# Patient Record
Sex: Male | Born: 1940
Health system: Southern US, Community
[De-identification: ages and names within clinical notes are randomized; demographics above are authoritative.]

## PROBLEM LIST (undated history)

## (undated) DIAGNOSIS — I8500 Esophageal varices without bleeding: Secondary | ICD-10-CM

## (undated) DIAGNOSIS — K8050 Calculus of bile duct without cholangitis or cholecystitis without obstruction: Secondary | ICD-10-CM

## (undated) DIAGNOSIS — Z9889 Other specified postprocedural states: Secondary | ICD-10-CM

## (undated) DIAGNOSIS — C44221 Squamous cell carcinoma of skin of unspecified ear and external auricular canal: Secondary | ICD-10-CM

## (undated) DIAGNOSIS — I639 Cerebral infarction, unspecified: Secondary | ICD-10-CM

## (undated) DIAGNOSIS — R748 Abnormal levels of other serum enzymes: Secondary | ICD-10-CM

## (undated) DIAGNOSIS — N189 Chronic kidney disease, unspecified: Secondary | ICD-10-CM

## (undated) DIAGNOSIS — E039 Hypothyroidism, unspecified: Secondary | ICD-10-CM

## (undated) DIAGNOSIS — K861 Other chronic pancreatitis: Secondary | ICD-10-CM

## (undated) DIAGNOSIS — E119 Type 2 diabetes mellitus without complications: Secondary | ICD-10-CM

## (undated) DIAGNOSIS — I10 Essential (primary) hypertension: Secondary | ICD-10-CM

## (undated) DIAGNOSIS — K7581 Nonalcoholic steatohepatitis (NASH): Secondary | ICD-10-CM

## (undated) DIAGNOSIS — D649 Anemia, unspecified: Secondary | ICD-10-CM

## (undated) HISTORY — DX: Essential (primary) hypertension: I10

## (undated) HISTORY — DX: Calculus of bile duct without cholangitis or cholecystitis without obstruction: K80.50

## (undated) HISTORY — DX: Squamous cell carcinoma of skin of unspecified ear and external auricular canal: C44.221

## (undated) HISTORY — PX: CATARACT EXTRACTION: SUR2

## (undated) HISTORY — DX: Other specified postprocedural states: Z98.890

## (undated) HISTORY — DX: Nonalcoholic steatohepatitis (NASH): K75.81

## (undated) HISTORY — DX: Abnormal levels of other serum enzymes: R74.8

## (undated) HISTORY — DX: Other chronic pancreatitis: K86.1

## (undated) HISTORY — DX: Chronic kidney disease, unspecified: N18.9

## (undated) HISTORY — DX: Esophageal varices without bleeding: I85.00

## (undated) HISTORY — DX: Anemia, unspecified: D64.9

## (undated) HISTORY — DX: Hypothyroidism, unspecified: E03.9

## (undated) HISTORY — DX: Type 2 diabetes mellitus without complications: E11.9

---

## 2000-04-26 ENCOUNTER — Encounter: Payer: Self-pay | Admitting: Internal Medicine

## 2000-04-26 ENCOUNTER — Ambulatory Visit (HOSPITAL_COMMUNITY): Admission: RE | Admit: 2000-04-26 | Discharge: 2000-04-26 | Payer: Self-pay | Admitting: Internal Medicine

## 2000-04-29 ENCOUNTER — Ambulatory Visit (HOSPITAL_COMMUNITY): Admission: RE | Admit: 2000-04-29 | Discharge: 2000-04-29 | Payer: Self-pay | Admitting: Internal Medicine

## 2000-04-29 ENCOUNTER — Encounter: Payer: Self-pay | Admitting: Internal Medicine

## 2000-04-29 ENCOUNTER — Encounter (INDEPENDENT_AMBULATORY_CARE_PROVIDER_SITE_OTHER): Payer: Self-pay | Admitting: Specialist

## 2001-06-18 ENCOUNTER — Ambulatory Visit (HOSPITAL_COMMUNITY): Admission: RE | Admit: 2001-06-18 | Discharge: 2001-06-18 | Payer: Self-pay | Admitting: Internal Medicine

## 2001-06-18 ENCOUNTER — Encounter: Payer: Self-pay | Admitting: Internal Medicine

## 2001-12-10 ENCOUNTER — Ambulatory Visit (HOSPITAL_COMMUNITY): Admission: RE | Admit: 2001-12-10 | Discharge: 2001-12-10 | Payer: Self-pay | Admitting: Urology

## 2001-12-10 ENCOUNTER — Encounter: Payer: Self-pay | Admitting: Urology

## 2002-06-01 ENCOUNTER — Encounter: Payer: Self-pay | Admitting: Internal Medicine

## 2002-06-01 ENCOUNTER — Ambulatory Visit (HOSPITAL_COMMUNITY): Admission: RE | Admit: 2002-06-01 | Discharge: 2002-06-01 | Payer: Self-pay | Admitting: Internal Medicine

## 2002-07-09 DIAGNOSIS — Z9889 Other specified postprocedural states: Secondary | ICD-10-CM

## 2002-07-09 HISTORY — DX: Other specified postprocedural states: Z98.890

## 2002-08-12 ENCOUNTER — Ambulatory Visit (HOSPITAL_COMMUNITY): Admission: RE | Admit: 2002-08-12 | Discharge: 2002-08-12 | Payer: Self-pay | Admitting: Internal Medicine

## 2002-08-12 HISTORY — PX: COLONOSCOPY: SHX174

## 2002-09-14 ENCOUNTER — Encounter: Payer: Self-pay | Admitting: Urology

## 2002-09-14 ENCOUNTER — Encounter: Admission: RE | Admit: 2002-09-14 | Discharge: 2002-09-14 | Payer: Self-pay | Admitting: Urology

## 2002-09-18 ENCOUNTER — Encounter (INDEPENDENT_AMBULATORY_CARE_PROVIDER_SITE_OTHER): Payer: Self-pay | Admitting: *Deleted

## 2002-09-18 ENCOUNTER — Ambulatory Visit (HOSPITAL_BASED_OUTPATIENT_CLINIC_OR_DEPARTMENT_OTHER): Admission: RE | Admit: 2002-09-18 | Discharge: 2002-09-18 | Payer: Self-pay | Admitting: Urology

## 2003-06-25 ENCOUNTER — Ambulatory Visit (HOSPITAL_COMMUNITY): Admission: RE | Admit: 2003-06-25 | Discharge: 2003-06-25 | Payer: Self-pay | Admitting: Internal Medicine

## 2004-03-09 DIAGNOSIS — Z9889 Other specified postprocedural states: Secondary | ICD-10-CM

## 2004-03-09 HISTORY — DX: Other specified postprocedural states: Z98.890

## 2004-03-20 ENCOUNTER — Ambulatory Visit (HOSPITAL_COMMUNITY): Admission: RE | Admit: 2004-03-20 | Discharge: 2004-03-20 | Payer: Self-pay | Admitting: Internal Medicine

## 2004-03-20 HISTORY — PX: ESOPHAGOGASTRODUODENOSCOPY: SHX1529

## 2004-03-26 ENCOUNTER — Inpatient Hospital Stay (HOSPITAL_COMMUNITY): Admission: EM | Admit: 2004-03-26 | Discharge: 2004-04-03 | Payer: Self-pay

## 2004-04-10 ENCOUNTER — Inpatient Hospital Stay (HOSPITAL_COMMUNITY): Admission: EM | Admit: 2004-04-10 | Discharge: 2004-04-12 | Payer: Self-pay | Admitting: Emergency Medicine

## 2004-07-09 DIAGNOSIS — K7581 Nonalcoholic steatohepatitis (NASH): Secondary | ICD-10-CM

## 2004-07-09 HISTORY — DX: Nonalcoholic steatohepatitis (NASH): K75.81

## 2004-07-09 HISTORY — PX: CHOLECYSTECTOMY: SHX55

## 2004-07-09 HISTORY — PX: LIVER TRANSPLANT: SHX410

## 2004-07-17 ENCOUNTER — Ambulatory Visit: Payer: Self-pay | Admitting: Internal Medicine

## 2004-07-24 ENCOUNTER — Ambulatory Visit: Payer: Self-pay | Admitting: Internal Medicine

## 2004-07-24 ENCOUNTER — Inpatient Hospital Stay (HOSPITAL_COMMUNITY): Admission: EM | Admit: 2004-07-24 | Discharge: 2004-07-26 | Payer: Self-pay | Admitting: Emergency Medicine

## 2004-08-17 ENCOUNTER — Inpatient Hospital Stay (HOSPITAL_COMMUNITY): Admission: EM | Admit: 2004-08-17 | Discharge: 2004-08-19 | Payer: Self-pay | Admitting: *Deleted

## 2004-09-04 ENCOUNTER — Ambulatory Visit: Payer: Self-pay | Admitting: Internal Medicine

## 2004-10-09 ENCOUNTER — Ambulatory Visit: Payer: Self-pay | Admitting: *Deleted

## 2004-10-09 ENCOUNTER — Ambulatory Visit: Payer: Self-pay | Admitting: Gastroenterology

## 2004-10-09 ENCOUNTER — Inpatient Hospital Stay (HOSPITAL_COMMUNITY): Admission: EM | Admit: 2004-10-09 | Discharge: 2004-10-13 | Payer: Self-pay | Admitting: Emergency Medicine

## 2004-10-11 ENCOUNTER — Ambulatory Visit: Payer: Self-pay | Admitting: *Deleted

## 2004-10-12 ENCOUNTER — Ambulatory Visit: Payer: Self-pay | Admitting: *Deleted

## 2004-10-26 ENCOUNTER — Inpatient Hospital Stay (HOSPITAL_COMMUNITY): Admission: EM | Admit: 2004-10-26 | Discharge: 2004-10-29 | Payer: Self-pay | Admitting: Emergency Medicine

## 2004-11-03 ENCOUNTER — Inpatient Hospital Stay (HOSPITAL_COMMUNITY): Admission: EM | Admit: 2004-11-03 | Discharge: 2004-11-07 | Payer: Self-pay | Admitting: Emergency Medicine

## 2004-11-06 ENCOUNTER — Ambulatory Visit: Payer: Self-pay | Admitting: Internal Medicine

## 2004-11-11 ENCOUNTER — Inpatient Hospital Stay (HOSPITAL_COMMUNITY): Admission: EM | Admit: 2004-11-11 | Discharge: 2004-11-16 | Payer: Self-pay | Admitting: Emergency Medicine

## 2004-11-23 ENCOUNTER — Ambulatory Visit: Payer: Self-pay | Admitting: Internal Medicine

## 2004-12-11 ENCOUNTER — Ambulatory Visit: Payer: Self-pay | Admitting: Internal Medicine

## 2004-12-25 ENCOUNTER — Ambulatory Visit: Payer: Self-pay | Admitting: Internal Medicine

## 2005-01-23 ENCOUNTER — Ambulatory Visit: Payer: Self-pay | Admitting: Internal Medicine

## 2005-02-19 ENCOUNTER — Ambulatory Visit: Payer: Self-pay | Admitting: Internal Medicine

## 2005-03-20 ENCOUNTER — Ambulatory Visit: Payer: Self-pay | Admitting: Internal Medicine

## 2005-04-16 ENCOUNTER — Ambulatory Visit: Payer: Self-pay | Admitting: Internal Medicine

## 2005-05-16 ENCOUNTER — Ambulatory Visit: Payer: Self-pay | Admitting: Internal Medicine

## 2005-06-12 ENCOUNTER — Ambulatory Visit: Payer: Self-pay | Admitting: Internal Medicine

## 2005-06-15 ENCOUNTER — Ambulatory Visit (HOSPITAL_COMMUNITY): Admission: RE | Admit: 2005-06-15 | Discharge: 2005-06-15 | Payer: Self-pay | Admitting: Internal Medicine

## 2005-06-18 ENCOUNTER — Ambulatory Visit: Payer: Self-pay | Admitting: Internal Medicine

## 2005-06-18 ENCOUNTER — Emergency Department (HOSPITAL_COMMUNITY): Admission: EM | Admit: 2005-06-18 | Discharge: 2005-06-18 | Payer: Self-pay | Admitting: Emergency Medicine

## 2005-06-20 ENCOUNTER — Ambulatory Visit (HOSPITAL_COMMUNITY): Admission: RE | Admit: 2005-06-20 | Discharge: 2005-06-20 | Payer: Self-pay | Admitting: Internal Medicine

## 2005-07-02 ENCOUNTER — Inpatient Hospital Stay (HOSPITAL_COMMUNITY): Admission: EM | Admit: 2005-07-02 | Discharge: 2005-07-04 | Payer: Self-pay | Admitting: Emergency Medicine

## 2005-07-25 ENCOUNTER — Encounter (HOSPITAL_COMMUNITY): Admission: RE | Admit: 2005-07-25 | Discharge: 2005-08-24 | Payer: Self-pay | Admitting: *Deleted

## 2005-08-21 ENCOUNTER — Ambulatory Visit: Payer: Self-pay | Admitting: Internal Medicine

## 2005-08-29 ENCOUNTER — Encounter (HOSPITAL_COMMUNITY): Admission: RE | Admit: 2005-08-29 | Discharge: 2005-09-28 | Payer: Self-pay | Admitting: *Deleted

## 2005-09-11 ENCOUNTER — Ambulatory Visit: Payer: Self-pay | Admitting: Cardiology

## 2005-09-11 ENCOUNTER — Encounter: Payer: Self-pay | Admitting: Emergency Medicine

## 2005-09-11 ENCOUNTER — Inpatient Hospital Stay (HOSPITAL_COMMUNITY): Admission: AD | Admit: 2005-09-11 | Discharge: 2005-09-15 | Payer: Self-pay | Admitting: Cardiology

## 2005-09-13 ENCOUNTER — Encounter: Payer: Self-pay | Admitting: Cardiology

## 2005-09-13 ENCOUNTER — Ambulatory Visit: Payer: Self-pay | Admitting: Internal Medicine

## 2005-09-28 ENCOUNTER — Ambulatory Visit: Payer: Self-pay | Admitting: *Deleted

## 2005-10-25 ENCOUNTER — Ambulatory Visit: Payer: Self-pay | Admitting: Internal Medicine

## 2005-12-25 ENCOUNTER — Ambulatory Visit: Payer: Self-pay | Admitting: *Deleted

## 2006-04-22 ENCOUNTER — Ambulatory Visit: Payer: Self-pay | Admitting: Internal Medicine

## 2006-07-22 ENCOUNTER — Ambulatory Visit: Payer: Self-pay | Admitting: Internal Medicine

## 2007-02-13 ENCOUNTER — Ambulatory Visit: Payer: Self-pay | Admitting: Internal Medicine

## 2007-09-04 ENCOUNTER — Ambulatory Visit: Payer: Self-pay | Admitting: Internal Medicine

## 2007-10-10 ENCOUNTER — Ambulatory Visit: Admission: RE | Admit: 2007-10-10 | Discharge: 2008-01-08 | Payer: Self-pay | Admitting: Radiation Oncology

## 2007-11-12 ENCOUNTER — Ambulatory Visit: Payer: Self-pay | Admitting: Internal Medicine

## 2008-05-12 ENCOUNTER — Ambulatory Visit: Payer: Self-pay | Admitting: Internal Medicine

## 2008-10-28 ENCOUNTER — Encounter: Payer: Self-pay | Admitting: Internal Medicine

## 2008-12-31 DIAGNOSIS — N182 Chronic kidney disease, stage 2 (mild): Secondary | ICD-10-CM

## 2008-12-31 DIAGNOSIS — D649 Anemia, unspecified: Secondary | ICD-10-CM

## 2008-12-31 DIAGNOSIS — K7689 Other specified diseases of liver: Secondary | ICD-10-CM

## 2008-12-31 DIAGNOSIS — I10 Essential (primary) hypertension: Secondary | ICD-10-CM | POA: Insufficient documentation

## 2008-12-31 DIAGNOSIS — E039 Hypothyroidism, unspecified: Secondary | ICD-10-CM

## 2008-12-31 DIAGNOSIS — E119 Type 2 diabetes mellitus without complications: Secondary | ICD-10-CM

## 2008-12-31 DIAGNOSIS — K7200 Acute and subacute hepatic failure without coma: Secondary | ICD-10-CM

## 2009-01-04 ENCOUNTER — Ambulatory Visit: Payer: Self-pay | Admitting: Internal Medicine

## 2009-01-04 DIAGNOSIS — M109 Gout, unspecified: Secondary | ICD-10-CM | POA: Insufficient documentation

## 2009-01-04 DIAGNOSIS — Z9189 Other specified personal risk factors, not elsewhere classified: Secondary | ICD-10-CM | POA: Insufficient documentation

## 2009-01-05 DIAGNOSIS — Z944 Liver transplant status: Secondary | ICD-10-CM | POA: Insufficient documentation

## 2009-01-11 ENCOUNTER — Encounter: Payer: Self-pay | Admitting: Internal Medicine

## 2009-02-04 ENCOUNTER — Ambulatory Visit (HOSPITAL_COMMUNITY): Admission: RE | Admit: 2009-02-04 | Discharge: 2009-02-04 | Payer: Self-pay | Admitting: Family Medicine

## 2009-05-25 ENCOUNTER — Encounter: Payer: Self-pay | Admitting: Internal Medicine

## 2009-08-15 ENCOUNTER — Encounter: Payer: Self-pay | Admitting: Internal Medicine

## 2009-11-30 ENCOUNTER — Encounter: Payer: Self-pay | Admitting: Internal Medicine

## 2009-12-30 ENCOUNTER — Encounter (INDEPENDENT_AMBULATORY_CARE_PROVIDER_SITE_OTHER): Payer: Self-pay | Admitting: *Deleted

## 2010-03-03 ENCOUNTER — Ambulatory Visit: Payer: Self-pay | Admitting: Internal Medicine

## 2010-03-29 ENCOUNTER — Encounter: Payer: Self-pay | Admitting: Internal Medicine

## 2010-05-16 ENCOUNTER — Encounter: Payer: Self-pay | Admitting: Internal Medicine

## 2010-05-30 ENCOUNTER — Encounter: Payer: Self-pay | Admitting: Internal Medicine

## 2010-08-03 DIAGNOSIS — R7989 Other specified abnormal findings of blood chemistry: Secondary | ICD-10-CM | POA: Insufficient documentation

## 2010-08-08 NOTE — Letter (Signed)
Summary: [facility]  [facility]   Imported By: Rexene Alberts 05/30/2010 16:30:10  _____________________________________________________________________  External Attachment:    Type:   Image     Comment:   External Document

## 2010-08-08 NOTE — Letter (Signed)
Summary: [facility]  [facility]   Imported By: Rexene Alberts 05/16/2010 16:42:01  _____________________________________________________________________  External Attachment:    Type:   Image     Comment:   External Document

## 2010-08-08 NOTE — Letter (Signed)
Summary: LABS   LABS   Imported By: Rexene Alberts 03/29/2010 16:27:59  _____________________________________________________________________  External Attachment:    Type:   Image     Comment:   External Document

## 2010-08-08 NOTE — Letter (Signed)
Summary: LABS FROM [facility]  LABS FROM [facility]   Imported By: Rexene Alberts 05/16/2010 08:21:48  _____________________________________________________________________  External Attachment:    Type:   Image     Comment:   External Document

## 2010-08-08 NOTE — Letter (Signed)
Summary: Recall Office Visit  [facility]  [address]   [place], [place] [address]   Phone: [phone]  Fax: [phone]      December 30, 2009   [name] [address] [place], [place]  [address] [birth year]   Dear [name],   According to our records, it is time for you to schedule a follow-up office visit with [place].   At your convenience, please call [phone] to schedule an office visit. If you have any questions, concerns, or feel that this letter is in error, we would appreciate your call.   Sincerely,    Diana Eves  [facility] Ph: [phone]   Fax: [phone]

## 2010-08-08 NOTE — Letter (Signed)
Summary: External Other  External Other   Imported By: Peggyann Shoals 11/30/2009 09:06:57  _____________________________________________________________________  External Attachment:    Type:   Image     Comment:   External Document

## 2010-08-08 NOTE — Assessment & Plan Note (Signed)
Summary: fu/ss   Visit Type:  Follow-up Visit Primary Care Provider:  mcgough  Chief Complaint:  follow up.  History of Present Illness: Status post orthotopic liver transplantation for Nash/cirrhosis. One year followup. Mr. Jeff has done very well; history of renal insufficiency followed closely by Dr. Caryn Section in [place].  Overall doing well. He is being checked periodically by the folks at the [place] [facility] [place]. He really hasn't had any significant health issues although he does have a dermatitis for which which he is seeing Dr. Jorja Loa up in [place], [place].    Current Problems (verified): 1)  Liver Replaced By Transplant  (ICD-V42.7) 2)  Gout, Unspecified  (ICD-274.9) 3)  Radiation Therapy, Hx of  (ICD-V15.9) 4)  Anemia, Normocytic  (ICD-285.9) 5)  Diabetes Mellitus, Type II  (ICD-250.00) 6)  Renal Insufficiency, Chronic  (ICD-585.9) 7)  Hypertension  (ICD-401.9) 8)  Hypothyroidism  (ICD-244.9) 9)  Other Chronic Nonalcoholic Liver Disease  (ICD-571.8) 10)  Liver Failure, Acute  (ICD-570)  Current Medications (verified): 1)  Aspirin 81 Mg Tabs (Aspirin) .... Once Daily 2)  Multivitamins  Tabs (Multiple Vitamin) .... Once Daily 3)  Synthroid 88 Mcg Tabs (Levothyroxine Sodium) .... As Directed 4)  Glimepiride 2 Mg Tabs (Glimepiride) .... Take 4mg  Twice A Day 5)  Cellcept 250 Mg Caps (Mycophenolate Mofetil) .... Take 2 Two Times A Day 6)  Lisinopril 20 Mg Tabs (Lisinopril) .... Once Daily 7)  Tylenol 325 Mg Tabs (Acetaminophen) .... As Needed 8)  Magnesium Protein 500 Mg .... Two Times A Day 9)  Onglyza 2.5 Mg Tabs (Saxagliptin Hcl) .... Once Daily  Allergies (verified): 1)  ! * Ivp Dye  Past History:  Past Medical History: Last updated: 03/16/2009 Current Problems:  LIVER REPLACED BY TRANSPLANT (ICD-V42.7) GOUT, UNSPECIFIED (ICD-274.9) RADIATION THERAPY, HX OF (ICD-V15.9) ANEMIA, NORMOCYTIC (ICD-285.9) DIABETES MELLITUS, TYPE II (ICD-250.00) RENAL INSUFFICIENCY, CHRONIC  (ICD-585.9) HYPERTENSION (ICD-401.9) HYPOTHYROIDISM (ICD-244.9) OTHER CHRONIC NONALCOHOLIC LIVER DISEASE (ICD-571.8) LIVER FAILURE, ACUTE (ICD-570) CORONARY ARTERY DISEASE   Past Surgical History: Last updated: 03/16/2009 LIVER TRANSPLANT AT [facility] IN 2006 CATH  Family History: Last updated: 01-10-2009 Father: deceased 59-hepatitis Mother: deceased 15- lymph cancer Siblings: 1 brother, 1 sister  Social History: Last updated: 01-10-2009 Marital Status: Married Children: 1 child living, 1 deceased Occupation:retired  Patient has never smoked.  Alcohol Use - no  Risk Factors: Smoking Status: never (01/10/09)  Vital Signs:  Patient profile:   70 year old male Height:      69 inches Weight:      243 pounds BMI:     36.01 Temp:     97.8 degrees F oral Pulse rate:   60 / minute BP sitting:   118 / 80  (left arm) Cuff size:   regular  Vitals Entered By: Hendricks Limes LPN (March 03, 2010 3:25 PM)  Physical Exam  General:  pleasant alert comfortable-appearing Abdomen:  well healed trasnplant scar ; some mild dermatitis present; soft, nt  negative HSM  Impression & Recommendations: Impression: A 70 year old woman status post orthotopic liver transplantation for Elita Boone cirrhosis appears to be doing extremely well at this time. It's been 7 years since he had a colonoscopy. He is not due until 2014. Overal,  he's got along extremely well since transplantation.  Recommendations: Will slate him for a return here for routine visit in one year and as needed.  Other Orders: Est. Patient Level III (62130)  NAME:  Kleine, Rorey H  ACCOUNT NO.:  [id number]   MEDICAL RECORD NO.:  [id number]                   PATIENT TYPE:  AMB   LOCATION:  DAY                                  FACILITY:  [facility]   PHYSICIAN:  R. Roetta Sessions, M.D.              DATE OF BIRTH:  January 17, 1941   DATE OF PROCEDURE:  08/12/2002  DATE OF DISCHARGE:                                  OPERATIVE REPORT   PROCEDURE PERFORMED:  Colonoscopy and snare polypectomy.   INDICATIONS FOR PROCEDURE:  The patient is a 70 year old gentleman who comes  for screening colonoscopy.  He has a history of NAFLD/cryptogenic cirrhosis  and is seeing Dr. Barbie Banner down at [facility] in the liver transplant clinic.  Colonoscopy is now being done for screening purposes.  Approach has been  discussed with the patient previously and again at the bedside.  Potential  risks, benefits and alternatives have been reviewed and questions answered.  Please see my handwritten H&P in my office dictation for more information  [facility].   MONITORING:  Oxygen saturations, blood pressure, pulse and respirations were  monitored throughout the entire procedure.   ANESTHESIA:  Conscious sedation Versed 2 mg IV, Demerol 50 mg IV in divided  doses.   INSTRUMENT USED:  Olympus video chipped adult colonoscope.   DESCRIPTION OF PROCEDURE:  Digital rectal exam revealed no abnormalities.  Prep was good.   Rectum:  Examination of rectal mucosa including the retroflex view and anal  verge revealed no mucosal abnormalities although mucosa was somewhat  diffusely friable.   Colon:  Colonic mucosa was surveyed from the rectosigmoid junction through  the left, right, transverse colon to the area of the appendiceal orifices,  ileocecal valve and cecum.  These structures were well seen and  photographed.  For the record, the patient had a 5 mm pedunculated polyp at  30 cm.  The remainder of the colonic mucosa appeared normal.  The terminal  ileum was intubated to 10 cm.  This segment of GI tract also appeared  normal.  From this level, the scope was slowly and cautiously withdrawn, and  all previous images of mucosal structures were again seen and again no  abnormalities were observed.  No other abnormalities   DICTATION ENDED HERE.                                               Jonathon Bellows, M.D.    RMR/MEDQ  D:   08/12/2002  T:  08/12/2002  Job:  [id number]  NAME:  MARVEL, MCPHILLIPS                          ACCOUNT NO.:  [id number]   MEDICAL RECORD NO.:  [id number]                   PATIENT TYPE:  AMB   LOCATION:  DAY  FACILITY:  [facility]   PHYSICIAN:  R. Roetta Sessions, M.D.              DATE OF BIRTH:  Jun 15, 1941   DATE OF PROCEDURE:  08/12/2002  DATE OF DISCHARGE:                                 OPERATIVE REPORT   The polyp at 30 cm was resected with snare cautery.  It was recovered  through the system.  The patient tolerated the procedure well.   IMPRESSION:  1. Somewhat diffusely friable rectum and colon.  2. Polyp at 30 cm, snared.  3. Remainder of colonic mucosa, rectum, and terminal ileum appeared normal.   RECOMMENDATIONS:  1. No aspirin or arthritis medications for the next 10 days.  2. Follow up on pathology.  3. Further recommendations to follow.                                               Jonathon Bellows, M.D.    RMR/MEDQ  D:  08/12/2002  T:  08/12/2002  Job:  [id number]   cc:   Madelin Rear. Sherwood Gambler, M.D.  [address]  [place]  [place] [address]  Fax: [phone]

## 2010-08-08 NOTE — Letter (Signed)
[facility]   Imported By: Ricard Dillon 08/15/2009 15:22:18  _____________________________________________________________________  External Attachment:    Type:   Image     Comment:   External Document

## 2010-09-13 ENCOUNTER — Other Ambulatory Visit: Payer: Self-pay | Admitting: Endodontics

## 2010-11-07 HISTORY — PX: MOUTH SURGERY: SHX715

## 2010-11-21 NOTE — Assessment & Plan Note (Signed)
NAMENEWEL, OIEN                 CHART#:  [id number]   DATE:  09/04/2007                       DOB:  [birth year]   DIAGNOSIS:  Status post orthotopic liver transplantation for neo  cirrhosis.   He has really done well from the standpoint of his liver transplant.  Last seen here February 13, 2007.  A big issue nowadays is he has gained  quite a bit of weight and his type 2 diabetes has flared on him.  He has  been on glimepiride the dose of which was increased recently by Dr.  Regino Schultze for high evening blood sugars.  He has also gained a fair amount  of weight, a good 20 pounds, on top of already being overweight in the  past year.  He currently weighs 259 pounds and he is really in the  morbidly obese category.  His blood pressure, in addition, has remained  elevated at 150/100 range on multiple checks per his report over the  past month.  He is 140/100 today with our large cuff.  Overall he is  doing well.  He is trying to get more exercise and cut back on  carbohydrates.  They tell me his fasting blood sugars is now running in  the 120s range although he had a blood sugar of 188 on assay August 12, 2007.  His creatinine has been elevated but it was normal at 1.33  recently.  He has not seen Dr. Caryn Section lately and is slated to see him  sometime later this year.  His LFTs are completely normal as is his CBC.  His FK506 level is slightly low at 4.6 back on August 12, 2007.  Magnesium borderline, lower limit of normal at 1.6.  He tells me [facility] has  slated him to see the hepatologist now once a yearly.  He will not be  seeing anybody there until January 2010.   CURRENT MEDICATIONS:  See updated list.   ALLERGIES:  IVP dye.   PHYSICAL EXAMINATION:  GENERAL:  He looks well.  VITAL SIGNS:  Weight 259, height 5 feet 9-1/2 inches, temperature 97.4.  BP 140/100, large cuff. Pulse 64.  SKIN:  Warm and dry.  There is no jaundice.  HEENT:  No scleral icterus.  CHEST:  Lungs are clear to  auscultation.  CARDIAC: Regular rate and rhythm without murmur, gallop or rub.  ABDOMEN:  Significantly obese.  Positive bowel sounds.  Soft.  No  shifting dullness or fluid wave. Abdomen is soft and entirely nontender.   ASSESSMENT:  1. Status post orthotopic liver transplantation secondary to neo      cirrhosis. Overall doing well from the standpoint from the      transplant.  He has gained too much weight and he is having trouble      with his diabetes now and hypertension.  It good to see his      creatinine is normal.  I am concerned about his blood pressure and      his elevated blood sugars.  We had a frank discussion about the      importance of exercise for weight loss.  He really needs to be on      an antihypertensive agent. I called Dr. Caryn Section and we talked about      this situation.  He recommended starting him on some lisinopril 20      mg orally daily.  We will make plans for him to see Dr. Caryn Section next      month (March).  Will call in a prescription for lisinopril to his      drug store.  Will plan to see him back here in three months.  2. I have recommended he have his blood pressure rechecked by Dr.      Regino Schultze in one week.       Jonathon Bellows, M.D.  Electronically Signed     RMR/MEDQ  D:  09/04/2007  T:  09/04/2007  Job:  [id number]   cc:   Kirk Ruths, M.D.  Richard F. Caryn Section, M.D.

## 2010-11-21 NOTE — Assessment & Plan Note (Signed)
NAMEQUEST, TAVENNER                 CHART#:  [id number]   DATE:  11/12/2007                       DOB:  1941/04/19   FOLLOWUP:  History of orthotopic liver transplant for NASH cirrhosis,  hypertension, obesity, type 2 diabetes mellitus.  New diagnosis of  squamous cell carcinoma, left pinna.  Mr. Lefeber was last seen here on  September 04, 2007.  In the interim, he saw Dr. Janalyn Harder over in  [place] who diagnosed the squamous cell carcinoma of his pinna.  A Mohr  surgical procedure was recommended, but he opted for radiation.  He is  seeing Dr. Roselind Messier down in [place], and he is in the process of  getting radiation therapy.  He has lost 3 pounds since he was last seen.  His glipizide was increased by Dr. Regino Schultze because of hyperglycemia.  Otherwise, he is doing well.  He is getting good reports from the  transplant clinic up at [facility].  Blood pressure is now well controlled on  lisinopril 20 mg daily.  He had labs done 2 days ago through [facility]; I do  not have the results for review at this time   CURRENT MEDICATIONS:  See updated list.   ALLERGIES:  IVP DYE.   PHYSICAL EXAMINATION TODAY:  VITAL SIGNS:  Appears baseline weight of  256, height 5 feet 10 inches, temperature 97.4, blood pressure 138/88,  pulse 74.  HEENT:  He has got a large area of erythema including the left ear/left  temporal area.  It appears to be radiation effects.  CHEST:  Lungs are clear to auscultation.  CARDIAC:  Regular rate and rhythm without murmur, gallop or rub.  ABDOMEN:  Significantly obese, positive bowel sounds, soft, nontender  without appreciable mass or megaly.  No perceptible fluid wave.  EXTREMITIES:  Trace lower extremity edema.   ASSESSMENT:  1. All in all, Mr. Wehner is getting along well status post orthotopic      liver transplantation.  2. I am concerned about his obesity with associated hypertension and      hyperlipidemia.  We talked about a getting more exercise and losing      some  weight.  He says he has joined [employer], and he is going to      try to get some weight off.  He continues to be an avid golfer, and      he just put a garden in.  3. New probable  squamous cell carcinoma of the left pinna in the      midst of beginning radiation therapy.  Encouraged him to follow      through this line of treatment as recommended by his radiation      oncologist.   Will review labs when they become available.  Unless something comes up,  plan to see this nice gentleman back in the office in 6 months.       Jonathon Bellows, M.D.  Electronically Signed     RMR/MEDQ  D:  11/12/2007  T:  11/12/2007  Job:  [id number]   cc:   Kirk Ruths, M.D.

## 2010-11-21 NOTE — Assessment & Plan Note (Signed)
NAMEBRYLER, [name]                 CHART#:  [id number]   DATE:  02/13/2007                       DOB:  1941/03/12   REASON FOR VISIT:  Followup orthotopic liver transplantation for state  of hepatitis, history of mild renal failure.   HISTORY OF PRESENT ILLNESS:  The patient has done well. He is followed  closely by Dr. Caryn [name] down in [place] as well as Dr. Ancil [name] his  transplant surgeon up at [facility]. His creatinine was normal back in May. It  is up slightly at 1.64. BUN was 34 back on 02/11/07. CBC has looked good  except for slightly depressed white count at 147.   The patient is overweight. He has gained a fair amount of weight since  his transplant. He weighs 240 pounds today. 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   __________  was good at 5.9 on 02/11/07. Magnesium 1.7.   His last colonoscopy was done by Dr. Lovell [name] previously. He is not due  for a follow-up exam until 2014. He has not had any fever, chills. He  has some low-back pain bending over picking up his cucumbers from time  to time. He has not seen Dr. Sherwood [name] recently.   CURRENT MEDICATIONS:  See updated list.   ALLERGIES:  IVP DYE.   PHYSICAL EXAMINATION:  GENERAL APPEARANCE: On exam today he appears at  his baseline.  VITAL SIGNS: Weight is 249 actually today, height 5 feet 9.5 inches,  temperature 98.3, BP 150/88, pulse 64.  SKIN: Warm and dry.  CHEST: Lungs are clear to auscultation.  CARDIAC: Exam shows a regular rate and rhythm without murmur, gallop or  rub.  ABDOMEN: The abdomen is obese, positive bowel sounds, soft, nontender  without appreciable mass.   ASSESSMENT:  Status post orthotopic liver transplantation; doing well.  Steady weight gain is somewhat of a concern at this point. He is being  watched closely by Dr. Caryn [name] and [name].   Has mild renal insufficiency.   RECOMMENDATIONS:  He is very active as far as daily activities are  concerned. I have  asked him to simply cut back on desserts and second  helpings. Unless something comes up, plan is to see this nice gentleman  back in 6 months.       [name], M.D.  Electronically Signed     RMR/MEDQ  D:  02/13/2007  T:  02/13/2007  Job:  [id number]   cc:   [name]. Kyle Gambler, MD  [name]. [name], M.D.

## 2010-11-24 NOTE — Op Note (Signed)
NAMEAMANI, [name]                ACCOUNT NO.:  [id number]   MEDICAL RECORD NO.:  [id number]          PATIENT TYPE:  EMS   LOCATION:  ED                            FACILITY:  [facility]   PHYSICIAN:  R. Roetta Sessions, M.D. DATE OF BIRTH:  1940-10-16   DATE OF PROCEDURE:  06/18/2005  DATE OF DISCHARGE:                                 OPERATIVE REPORT   PROCEDURE:  Large volume paracentesis.   INDICATIONS FOR PROCEDURE:  The patient is a 70 year old gentleman with  NASH/cirrhosis, recurrent ascites, and anasarca. He was seen in the office a  couple of weeks ago. He was gaining weight. Abdomen became distended last  week. Ultrasound demonstrated marked ascites. He came to the emergency  department today. Dr. Margretta Ditty evaluated him and did not think he had any  cardiopulmonary issues. We decided to go ahead and do a large volume  paracentesis on him. This approach along with the potential risks, benefits,  and alternatives have been reviewed with he and his wife at length. All of  his questions were answered. He is agreeable. Please see documentation in  the medical record.   PROCEDURE NOTE:  O2 saturation, blood pressure, pulse, and respirations were  monitored throughout the entire procedure. Conscious sedation:  None given.  Three cc of 1% plain Xylocaine used for local infiltrative anesthesia. After  a suitable site was selected, right lower quadrant and overlying skin was  prepped in sterile fashion with Betadine. Using the Cadwell Quick-Tap  needle, the abdominal wall was traversed. The abdominal wall was thick, and  the peritoneal cavity was accessed only after the Cadwell needle went up to  the hub, and 10.2 liters of serosanguineous fluid was recovered. The patient  tolerated the procedure very well.   IMPRESSION:  1.  Status post large volume paracentesis as described above.  2.  The patient is to be weighed before and after the procedure.  3.  Continue 2-g sodium diet. Continue  diuretic therapy.  4.  Salt-poor albumin 75 g IV prior to discharge.  5.  Will send fluid for cell count with differential, anaerobic/aerobic      culture, AFB smear and culture and a canister (3 liters) to a cytology.  6.  Further recommendations to follow.      Jonathon Bellows, M.D.  Electronically Signed    RMR/MEDQ  D:  06/18/2005  T:  06/18/2005  Job:  [id number]   cc:   Kirk Ruths, M.D.  Fax: [phone]

## 2010-11-24 NOTE — Consult Note (Signed)
NAMEMANUS, WEEDMAN                          ACCOUNT NO.:  [id number]   MEDICAL RECORD NO.:  [id number]                   PATIENT TYPE:  INP   LOCATION:  IC06                                 FACILITY:  [facility]   PHYSICIAN:  Lionel December, M.D.                 DATE OF BIRTH:  08/04/1940   DATE OF CONSULTATION:  03/27/2004  DATE OF DISCHARGE:                                   CONSULTATION   GASTROINTESTINAL CONSULT   REQUESTING PHYSICIAN:  Kirk Ruths, M.D.   REASON FOR CONSULTATION:  Hepatic encephalopathy/cirrhosis.   HISTORY OF PRESENT ILLNESS:  [name] is a 70 year old Caucasian male who  was found by his wife, yesterday morning to be extremely confused and  combative around 6:45 a.m.  She then called the ambulance and he was later  transported to [facility] for admission.  He was found to have an  elevated ammonia level of 56.  He does have a history of NASH cirrhosis  diagnosed approximately 4 years ago.  He underwent recent EGD on 03/20/2004  which revealed grade 2 esophageal varices as well as portal gastropathy and  multiple antral erosions as well as a single pyloric channel ulcer.  He is  currently on Inderal 10 mg t.i.d., Aldactone 25 mg b.i.d. and lactulose 30  mg t.i.d. via NG tube.  He has not had a bowel movement since admission.   His wife denies any jaundice. He does have pruritus and dry skin.  He also  has daily gum bleeding.  He is found to have thrombocytopenia with platelets  of 88.  Albumin is low at 2.6, total bilirubin is elevated at 2.8, direct  0.5, and indirect 2.3.  Alkaline phosphatase is elevated at 130, SGOT at 67,  SGPT at 71, total protein is 7.2.  He underwent a noncontrast head CT which  was negative.  He is also H. pylori negative.  His last ultrasound was in  December of 2004 which showed changes consistent with cirrhosis.   PAST MEDICAL HISTORY:  Hypertension, diabetes mellitus for 2 years,  colonoscopy with polypectomy in  February 2004, NASH cirrhosis, currently on  Inderal with history of grade 2-3 esophageal varices.  Last EGD 03/20/2004  by Dr. Jena Gauss revealed pleural gastropathy grade 2, esophageal varices,  multiple antral erosions and a single pyloric channel ulcer, severe  balanitis, phimosis and frequent urinary tract infections requiring  circumcision in March of 2004.   MEDICATIONS PRIOR TO ADMISSION:  1.  Lasix 40 mg daily.  2.  Spironolactone 25 mg b.i.d.  3.  Synthroid 35 mg daily.  4.  __________ 5 mg p.r.n. back pain.  5.  Inderal 10 mg t.i.d.  6.  Prednisone taper.   ALLERGIES:  IVP dye.   FAMILY HISTORY:  Positive for a father with NASH cirrhosis, deceased at age  66.  Also mother with history of lymphoma deceased in  her 75s.  One daughter  deceased secondary to complications of diabetes mellitus.  One brother and  one sister both with hypertension.   SOCIAL HISTORY:  [name] has been married for 39 years.  He has 1 living  child who is relatively healthy except for hypertension.  He denies any  tobacco, alcohol or drug use.  He is currently on disability.   REVIEW OF SYSTEMS:  Mostly obtained from his wife as the patient is  nonverbal at this time.  CONSTITUTIONAL:  She reports his weight is stable.  His appetite has been okay; and he had not been complaining of fatigue, at  least not to her.  CARDIOVASCULAR:  No known problems with chest pain or  palpitations.  PULMONARY:  No dyspnea.  GI:  She notes that he had not been  complaining of abdominal pain, nausea, vomiting, diarrhea, constipation,  rectal bleeding or melena.  MUSCULOSKELETAL:  She did report he had a recent  fall a few days ago which resulted in right hip and right upper leg pain  which is why he was started on oxycodone.   PHYSICAL EXAMINATION:  VITAL SIGNS:  Temperature 98.2, pulse 73,  respirations 16, blood pressure 134/66.  O2 saturation 97% on 2 liters via  nasal cannula.  Height 69 inches.  Weight 274.2.   GENERAL:  [name] is lying supine. He does have O2 at 2 liters per nasal  intact.  Respirations nonlabored.  He is resting quietly with wife at the  bedside.  HEENT:  Sclerae are clear.  Nonicteric.  Conjunctivae pink. Pupils are  equal, round, and reacted to light and accommodation.  He is nonverbal but  responds with movement to painful stimuli.  He moans and groans throughout  the exam; however, does not appear to be in any distress.  Oropharynx with  significant amount of dried blood to the oropharynx and dorsum of the tongue  consistent with previous gingival bleeding.  NECK:  Supple without any masses or thyromegaly.  HEART:  Regular rate and rhythm with normal S1-S2.  LUNGS:  Clear to auscultation.  ABDOMEN:  Protuberant.  Positive bowel sounds x4.  No bruits auscultated.  Soft, nontender, slightly distended, although no tense ascites.  Liver is  palpable 4 fingerbreadths below the right costal margin.  He does have  splenomegaly.  No palpable mass.  EXTREMITIES:  2+ pedal pulses bilaterally.  No edema.  SKIN:  Pink, warm and dry.  He does have a large abrasion to his right knee  as well as multiple ecchymotic areas to bilateral upper and lower  extremities.  No jaundice.  GENITOURINARY:  He does have 100 cc of orange, tea-colored urine in the bag.   LABORATORY STUDIES:  WBC 9, hemoglobin 14.1, hematocrit 41.4, platelets 88,  calcium 8.7. Sodium 136, potassium 4, chloride 106, CO2 26, BUN 19,  creatinine 1.1, glucose 200, __________ of 56.  See HPI for others.   ASSESSMENT:  [name] is a 70 year old Caucasian male who was found  confused and combative yesterday morning by his wife. He does have history  of NASH cirrhosis as well as grade 2 esophageal varices, portal gastropathy,  thrombocytopenia with platelets of 88, hypoalbuminemia with albumin of 2.6,  and total bilirubin of 2.8.  Alkaline phosphatase slightly elevated as well as SGOT and SGPT.  He has never had a history  of hepatic encephalopathy.  Ammonia was found to be 56.  Most likely he has developed hepatic  encephalopathy from  his worsening decompensated nonalcoholic steatohepatitis  cirrhosis.   RECOMMENDATIONS:  1.  Will follow up on abdominal ultrasound.  2.  Chest serum ammonia in the morning.  3.  PT and INR in the morning; MET-7 in the morning.  4.  Agree with lactulose 30 mg t.i.d.  5.  Lactulose 300 mL retention enema now.  6.  Further recommendations to follow.   We would like to thank Dr. Regino Schultze for allowing [place] to participate in the  care of [name].      KC/MEDQ  D:  03/27/2004  T:  03/27/2004  Job:  [id number]   cc:   Kirk Ruths, M.D.  [address]  [place]  [place] [address]  Fax: [phone]   R. Roetta Sessions, M.D.  [address]  [place]  [place] [address]  Fax: [phone]

## 2010-11-24 NOTE — Consult Note (Signed)
[name], SCHIPPERS                ACCOUNT NO.:  [id number]   MEDICAL RECORD NO.:  [id number]          PATIENT TYPE:  INP   LOCATION:  6526                         FACILITY:  [facility]   PHYSICIAN:  Dyke Maes, M.D.DATE OF BIRTH:  [birth year]   DATE OF CONSULTATION:  09/11/2005  DATE OF DISCHARGE:                                   CONSULTATION   HISTORY OF PRESENT ILLNESS:  Mr. Perkins is a 70 year old Caucasian male with  past medical history significant for liver transplant at [facility]  [place] in December 2006 secondary to NASH cirrhosis, hypertension, and  acute renal failure and follows with Dr. Dr. Caryn Section at our office.  He was  admitted early this morning by [facility] for cardiac  catheterization in the morning secondary to abrupt onset of mid to left  sternal chest pain early this morning.  We were consulted to assist in  prevention of contrast-induced nephropathy given patient's renal  insufficiency of unknown etiology per my records available.   According to patient's primary care physician, which is Dr. Jena Gauss in  [place], base creatinine is 1.6 which was documented in November 2006.  I  do not have previous serum creatinine prior to that time; however, the  patient has a known history of hypertension all my life and insulin-  dependent diabetes mellitus for 2 to 3 years.  The patient's serum  creatinine rose postop transplant to 3.6 on July 12, 2005, that has  steadily declined over the last few months.  Etiology is unclear, though  [facility] [place] suspected acute tubular necrosis and questionable  Prograf nephrotoxicity.  The patient's last serum creatinine on August 30, 2005, was 1.9 and is 1.9 today.   Today the patient is still experiencing chest tightness.  He denies  shortness of breath.  He denies fevers, chills, or night sweats.  Appetite  is good, and he has lost approximately 36 pounds over the last few months  during his hospital  stay.  He denies cough or congestion.  He states his  energy level is good and much improved prior to his transplant.  His urinary  output is unchanged and appropriate without hematuria or frequency.  He has  had no change in bowel habits, though over the last week he has experienced  some increased diarrhea and, therefore, has decreased his oral magnesium to  1 p.o. twice daily.   PAST MEDICAL HISTORY:  As above, also:  1.  Hypothyroidism.  2.  Anemia.  3.  Insulin-dependent diabetes mellitus x2 to 3 years.  4.  History of pneumonia.  5.  Hypertension all of my life.   ALLERGIES:  Questionable CONTRAST DYE allergy in the 1970s which gave him a  rash.   MEDICATIONS:  1.  Prograf 1 mg 3 tablets in the a.m., 2 tablets in the p.m.  2.  CellCept 250 mg 4 twice daily.  3.  Pentazine 5 mg q.a.m.  4.  Colace twice daily.  5.  Nystatin 5 mg 3 times a day.  6.  Multivitamins daily.  7.  Synthroid 0.1 mcg  daily.  8.  Nexium 40 mg daily.  9.  Amaryl 2 mg daily.  10. Bactrim SS Mondays and Thursdays.  11. Insulin R sliding insulin.  12. Magnesium 400 mg p.o. twice daily.   FAMILY HISTORY:  The patient's father died at age 29 of cirrhosis.  His  mother died at age 64 of lymphoma.  He has a brother, age 24, and a sister,  age 39, who are in good health.  He has no family history of renal disease  that he is aware of.  He has a daughter who is healthy.  He has another  daughter who unfortunately died in 2003-12-01 with complications of juvenile  diabetes.   SOCIAL HISTORY:  The patient currently lives with his wife in [place].  He has never drunk alcohol nor has he smoked tobacco.  No illegal drug use.  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   REVIEW OF SYSTEMS:  Please see History of Present Illness.   PHYSICAL EXAMINATION:  VITAL SIGNS: Temperature 98.9, heart rate 67,  respirations 20, blood pressure 125/67, O2 saturation 99% on room air.  GENERAL: Mr. Sear is a very pleasant  70 year old Caucasian male who is  alert, oriented x3 and in no acute distress.  NECK:  Supple.  No JVD is noted.  HEART: Regular rate and rhythm.  S1 and S2 present with no murmurs, gallops,  or rubs auscultated.  LUNGS: Grossly clear.  No wheezes noted.  ABDOMEN:  Obese.  Large mid abdominal healed scar with small right upper  quadrant opening status post drain removal.  Serosanguineous drainage is  noted which is mild.  There are no signs of infection. Bowel sounds are  present x4.  Abdomen is soft, nontender to palpation, no ascites, no  guarding.  EXTREMITIES: Negative bilateral lower extremity edema.   LABORATORY DATA:  WBC 6.6, hemoglobin 9.4, hematocrit 29, platelets 149.  Sodium 140, potassium 4.1, BUN 40, creatinine 1.9, calcium 8.7.   ASSESSMENT AND PLAN:  1.  Chest pain on admission.  Plan for cardiac catheterization in the      morning.  2.  Chronic renal insufficiency, risk of CONTRAST nephropathy discussed with      patient to include approximately 40% chance of serum creatinine      increasing but then decreasing over time.  There is less than a 10%      chance of hemodialysis if that were to happen. The patient understands      the risk and wishes to proceed with the cardiac      catheterization.  We would suggest the use of IV hydration as well as      sodium bicarbonate and Mucomyst per protocol prior to catheterization.      We will monitor his serum creatinine closely over the next 3 days.  We      will also check a magnesium level given patient's symptomatic diarrhea.      Azucena Fallen, PA    ______________________________  Dyke Maes, M.D.    MY/MEDQ  D:  09/11/2005  T:  09/11/2005  Job:  [id number]   cc:   [facility]   Dr. Karilyn Cota, Primary Care physician  [place]

## 2010-11-24 NOTE — Procedures (Signed)
NAMEDYQUAN, MINKS                ACCOUNT NO.:  [id number]   MEDICAL RECORD NO.:  [id number]          PATIENT TYPE:  INP   LOCATION:  A306                          FACILITY:  [facility]   PHYSICIAN:  Vida Roller, M.D.   DATE OF BIRTH:  1941/02/22   DATE OF PROCEDURE:  10/13/2004  DATE OF DISCHARGE:  10/13/2004                                  ECHOCARDIOGRAM   [name] is a gentleman awaiting liver transplant who has no significant  cardiac problems and this is an evaluation prior to the transplant.   Technical quality of the study is difficult due to the patient's body  habitus.   M-MODE TRACINGS:  The aorta is 31 mm.   The left atrium is 47 mm.   The septum is 14 mm.   Posterior wall is 10 mm.   Left ventricular diastolic dimension 40 mm.   Left ventricular systolic dimension 26 mm.   2-D AND DOPPLER IMAGING:  The left ventricle is normal size with normal  systolic function, estimated ejection fraction of 55-60%. There are no  significant wall motion abnormalities. There is mild concentric left  ventricular hypertrophy.   The right ventricle is top normal limits of size with normal systolic  function.   Both atria appear to be enlarged, left greater than right.   The aortic valve is slightly sclerotic with no evidence of stenosis or  regurgitation.   The mitral valve has mild linear calcification with trace insufficiency. No  stenosis is seen.   The tricuspid valve has mild regurgitation.   The pulmonic valve is not well seen.   There does not appear to be significant pericardial effusion.   The inferior vena cava was not well seen.   The ascending aorta was not well seen.      JH/MEDQ  D:  10/13/2004  T:  10/14/2004  Job:  [id number]   cc:   Kirk Ruths, M.D.  [address]  [place]  [place] [address]  Fax: [phone]

## 2010-11-24 NOTE — H&P (Signed)
[name], [name]                ACCOUNT NO.:  [id number]   MEDICAL RECORD NO.:  [id number]          PATIENT TYPE:  INP   LOCATION:  A222                          FACILITY:  [facility]   PHYSICIAN:  Patrica Duel, M.D.    DATE OF BIRTH:  13-May-1941   DATE OF ADMISSION:  11/03/2004  DATE OF DISCHARGE:  LH                                HISTORY & PHYSICAL   CHIEF COMPLAINT:  Confusion.   HISTORY OF PRESENT ILLNESS:  This is a 70 year old white male with a history  of end-stage liver disease.  He has been frequently admitted with acute  episodes of hepatic encephalopathy.  He also has a history of hypertension  and diabetes which have been well controlled.   The patient was doing quite well yesterday and even worked in his yard with  a Scientist, physiological without difficulty.  He became increasingly lethargic and  confused.  He was brought to the emergency department.  There, his ammonia  level was found to be 270.  He was quite agitated and uncontrollable, and  Haldol 10 mg was administered by the ER physician.  He has been somnolent  since.   There is no history of headache or neurologic deficits other than those  noted, and there is no history of chest pain, shortness of breath, nausea,  vomiting, diarrhea, melena, hematemesis, hematochezia, or genitourinary  symptoms.   The patient is admitted with recurrent hepatic encephalopathy of acute  onset.   PAST MEDICAL HISTORY:  1.  As noted above.  2.  He also has compensated hypothyroidism.  3.  History of TIP related to cirrhosis and hypertension.   CURRENT MEDICATIONS:  1.  Synthroid 88 mcg daily.  2.  Spironolactone 100 mg b.i.d.  3.  Lactulose 15 mg b.i.d.  4.  Lasix 20 mg q.a.m.  5.  Propranolol 10 mg two tabs daily.  6.  Urso 500 mg two tabs daily.  7.  Amaryl 2 mg one tab daily.  8.  Omeprazole 20 mg daily.  9.  Zinc.  10. Fish oil.  11. Vitamin E.  12. Multivitamins.  13. He was also begun on Remifixin by Dr. __________this  week for his      diarrheal problems.   ALLERGIES:  IVP DYE.   PAST MEDICAL HISTORY:  As noted above.   SOCIAL HISTORY:  Nonsmoker, nondrinker, very supportive family.   FAMILY HISTORY:  Noncontributory.   REVIEW OF SYSTEMS:  Negative, except as mentioned.   PHYSICAL EXAMINATION:  GENERAL:  This is a large male who is currently  somnolent and unresponsive.  VITAL SIGNS:  Respirations are normal.  HEENT:  Normocephalic, atraumatic.  Pupils are equal.  Extraocular movements  are intact.  There are no doll's eyes.  Ears, nose, and throat are benign.  NECK:  Supple without bruits or thyromegaly.  LUNGS:  Clear.  HEART:  Heart sounds are normal without murmurs, rubs, or gallops.  ABDOMEN:  Nontender, nondistended, bowel sounds are intact.  EXTREMITIES:  No clubbing or cyanosis, trace edema only.  NEUROLOGIC:  Nonfocal.  He is hypersomnolent at this  time.   ASSESSMENT:  1.  Recurrent hepatic encephalopathy.  2.  Currently, hypersomnolence possibly related to Haldol administration.   PLAN:  1.  Lactulose administration.  2.  Neomycin administration.  3.  We will consult Dr. Jena Gauss for further opinions.  He is on a transplant      list at [facility] [place], and hopefully we can get him there for      further evaluation in the very near future.  4.  We will follow and treat expectantly.      MC/MEDQ  D:  11/04/2004  T:  11/04/2004  Job:  [id number]

## 2010-11-24 NOTE — Consult Note (Signed)
[name], [name]                ACCOUNT NO.:  [id number]   MEDICAL RECORD NO.:  [id number]          PATIENT TYPE:  INP   LOCATION:  IC03                          FACILITY:  [facility]   PHYSICIAN:  Lionel December, M.D.    DATE OF BIRTH:  09-05-1940   DATE OF CONSULTATION:  10/26/2004  DATE OF DISCHARGE:                                   CONSULTATION   REASON FOR CONSULTATION:  Recurrent hepatic encephalopathy.   HISTORY OF PRESENT ILLNESS:  [name] is a 70 year old Caucasian male who was  admitted to Dr. Edison Simon service earlier today via emergency room where he  was brought by family members with a few day history of confusion. This is  the sixth admission for hepatic encephalopathy since September 2006. The  patient was at [facility] between October 09, 2004 and October 13, 2004.  During the last admission, he had abdominal paracentesis. Cultures are all  negative. He was doing well at the time of discharge and weighed 270 pounds  on the day of discharge. The patient is unable to provide any history, most  of which is obtained from his wife and their daughter. He did well until 4  days ago when he started to act strange. He appeared to be lost or confused  and also wouldn't remember anything. On Saturday, the day before onset of  these symptoms, he felt well enough that he helped his wife planting a small  garden. In spite of his symptoms, he has been able to swallow all of his  medications and take his lactulose. According to family members, his bowels  have been moving 12 to 15 times a day. These are loose to normal stools. He  has not had nausea, vomiting, melena, rectal bleeding, or dysuria. He also  has not been complaining of headache, fever, or chills. He does not take any  narcotics or over-the-counter medications. He did have a fall yesterday,  however, he did not sustain any injury to his head.   The patient has been evaluated at [facility] for  possible OLT. I was told by Dr. Allena Katz that he would not be a candidate until  his BMI was below 35. He was therefore sent over to [facility] in [place],  [place] where he was seen by Dr. Hal Hope. He had workup as  recommended by him in preparation for him to be placed on a transplant list  but this has not happened yet. He does not have an appointment to see him  until June. Now I am told by Mrs. Bir that his carrier will not cover his  transplant at [facility] but they would cover it at [facility].   MEDICATIONS:  He is on Synthroid 75 mcg daily, spironolactone 100 mg b.i.d.,  Lasix 20 mg daily, Aciphex 20 mg q.a.m., Lactulose 30 ml 2 to 4 times a day,  Zinc 1 daily, vitamin E alpha daily, MVI daily, and he is also on Amaryl 2  to 4 mg daily.  The patient finished his Neomycin yesterday. We have been  treating  him in a cyclic fashion in order to prevent nephrotoxicity.   PAST MEDICAL HISTORY:  Cirrhosis felt to be secondary to NASH. His workup  has been performed by Dr. Jena Gauss and he also has been seen by Dr. Allena Katz of  hepatology at [facility]. As recently as a couple of  months ago, he was retested for hepatitis C and the studies were negative.  He had an electroencephalogram in September of 2005 revealing grade 2 to 3  esophageal varices and portal gastropathy with a single pyloric channel  ulcer. He has never bled from his varices. Recurrent hepatic encephalopathy.  He was admitted twice in September 2005 and again in November, January,  February, and this is the second admission this month. He has hypotension,  diabetes mellitus, hypothyroidism, and obesity. He also has had gout and  kidney stones in the past. He had polyps removed in February 2004.   ALLERGIES:  IODINE DYE.   SOCIAL HISTORY:  He is married. He has 1 daughter. He is presently on  disability. He works in Dentist. He has never smoked cigarettes but  has not drank  alcohol in the past. He has 1 daughter. Another daughter died  of complications secondary diabetes mellitus.   FAMILY HISTORY:  Father died of cirrhosis at age 42, possibly secondary to  NASH. Mother died of lymphoma in her 57's. Two siblings have hypertension.   PHYSICAL EXAMINATION:  GENERAL:  A well developed, morbidly obese, Caucasian  male who is vague in response to simple commands such as making a fist.  However, he is making a fist. He is not able to stretch his arms to check  for asterixis.  VITAL SIGNS:  Weight recorded to be 243.4 pounds. He is 69 inches tall. BMI  is 36. Pulse 69 per minute. Blood pressure 123/58. Temperature 100.6.  Respiratory rate 17.  HEENT:  Conjunctivae is pain. Sclerae is nonicteric. Oropharyngeal  examination is limited but unremarkable.  NECK:  Supple. No thyromegaly or lymphadenopathy noted.  CARDIAC:  Regular rhythm. Normal S1 and S2. No murmur or gallop noted.  LUNGS:  Clear to auscultation.  ABDOMEN:  Protuberant but soft and nontender. Unable to palpate the liver  edge. Flanks are full. Spleen is not palpable. He has multiple stretch marks  over his gluteal areas and back.  EXTREMITIES:  He does not have calf edema or clubbing.  NEUROLOGIC:  The patient is awake and responds to simple commands.   LABORATORY DATA:  On admission:  White blood cell count 6.4. Hemoglobin and  hematocrit 13.6 and 38.8. Platelet count is 96,000. Urine specific gravity  is 1020. Nitrite is negative. Leukocyte esterase is also negative. Sodium  132, potassium 4.9, chloride 106. CO2 is 21, glucose 154, BUN 20, creatinine  1.1. Calcium 8.7. Total bilirubin is 2.2, AP 155, AST is 58, ALT 38, and  albumin is 2.1. Serum ammonia 183 micromoles per liter. Urine drug screen is  negative.   ASSESSMENT:  [name] is a 70 year old Caucasian male with cirrhosis felt to  be secondary to nonalcohol-induced steatohepatitis (NASH), complicated by nonbleeding esophageal varices, who  presents with confusion of few days  duration. He is encephalopathic again. This is his sixth admission, the last  7 months. There appears to be no triggers except his weight is down by 27  pounds. However, his BUN and creatinine are normal but he may be mildly  dehydrated. He has a low grade temperature and urinalysis is normal. It  appears that he has been very compliant with his medicines as well as his  diet. He has advanced disease and very marginal function. He is still not  placed on the list for OLT.   RECOMMENDATIONS:  While he is NPO or unable to swallow, we will give him  intravenous fluids at a rate of 75 cc per hour (D5 normal saline). Will hold  off his Lasix but resume his other medications as feasible. Unless his  ___________ function returns, we may have to give him a Lactulose enema.   The patient's condition will be reassessed. We will repeat his metabolic 7,  ammonia, and also check his INR in the a.m.   We would like to thank Dr. Regino Schultze for the opportunity to participate in the  care of his gentleman.      NR/MEDQ  D:  10/26/2004  T:  10/26/2004  Job:  [id number]   cc:   Lenoria Chime, M.D.  Hepatology Dept.  [place], [place]

## 2010-11-24 NOTE — Discharge Summary (Signed)
[name], [name]                ACCOUNT NO.:  [id number]   MEDICAL RECORD NO.:  [id number]          PATIENT TYPE:  INP   LOCATION:  6526                         FACILITY:  [facility]   PHYSICIAN:  Rollene Rotunda, M.D.   DATE OF BIRTH:  11/16/1940   DATE OF ADMISSION:  09/11/2005  DATE OF DISCHARGE:  09/15/2005                                 DISCHARGE SUMMARY   PRIMARY CARDIOLOGIST:  Vida Roller, M.D., [facility] office.   PRIMARY CARE DOCTOR:  Kirk Ruths, M.D.   RENAL:  Wilber Bihari. Caryn Section, M.D.   GASTROENTEROLOGIST:  Jonathon Bellows, M.D.   LIVER TRANSPLANT COORDINATOR:  Aimee Roman at the [place] [facility] [place].   DISCHARGING DIAGNOSES:  1.  Coronary artery disease, status post cardiac catheterization on September 12, 2005.  The major epicardial vessels essentially exhibit mild      atherosclerosis; however, the second diagonal branch has a focal 90%      stenosis.  The left anterior descending artery has minor luminal      irregularities including a 30% stenosis distal to the second diagonal      branch.  The left main is calcified essentially throughout its course.      There is a 20% stenosis in the midvessel segment.  The right coronary      artery has a 20% proximal and midvessel stenosis as well as what appears      to be approximately 50% stenosis in the distal portion of the      posterolateral system.  The left ventriculography was not performed in      an effort to conserve contrast use.  Patient to be medically treated      with aspirin and Coreg.  2.  Non-ST elevated myocardial infarction with a troponin of 0.67.  3.  Gastrointestinal bleed/anemia, status post esophagogastroduodenoscopy on      September 14, 2005, showing esophageal varices without bleed, gastritis,      which is the cause of heme-positive stools, portal hypertension (mild)      and esophageal stricture. Esophagogastroduodenoscopy done by Wilhemina Bonito.      Marina Goodell, M.D.  At time of discharge, hemoglobin  9.2 and hematocrit 27.2.  4.  Renal insufficiency, stable at this time.  At time of discharge, BUN 29,      creatinine 1.7.   PAST MEDICAL HISTORY:  1.  Hypothyroidism.  2.  Hypertension.  3.  Chronic renal insufficiency.  4.  Type 2 diabetes.  5.  Normocytic anemia.  6.  Status post liver transplant at the [facility] [place] in December      2006 secondary to nonalcoholic hepatosteatosis (NASH) cirrhosis.   PROCEDURES THIS ADMISSION:  1.  Esophagogastroduodenoscopy done on September 14, 2005.  2.  Cardiac catheterization done on September 12, 2005.   CONSULTATIONS THIS ADMISSION:  1.  Dyke Maes, M.D., on September 11, 2005, with renal.  2.  Wilhemina Bonito. Marina Goodell, M.D., on September 13, 2005.   HOSPITAL COURSE:  [name] is a 70 year old Caucasian gentleman with past  medical history  significant for liver transplant secondary to NASH  cirrhosis, hypertension and acute renal failure.  He is followed by Dr. Caryn Section  for his acute renal failure.  He also has insulin-dependent diabetes for the  last two to three years.  [name] was initially evaluated at [facility] prior to this admission for evaluation of chest discomfort.  Given  patient's multiple risk factors and his ongoing symptoms of chest  discomfort, it was felt that the would benefit from further evaluation.  The  patient was transferred to [facility] and evaluated by Dr. Jens Som.  Given  the patient's recent liver transplant, Dr. Jens Som spoke with the  transplant coordinator at the [facility] [place] and discussed the use  of aspirin and antiplatelet agents and cardiac catheterization in setting of  recent transplant surgery.  The patient was started on IV heparin, aspirin  as well as a beta blocker.  The patient was known to have a history of IVP  DYE allergy.  We also contacted Dr. Briant Cedar with [facility] to assist with renal protection in regard to his cardiac  catheterization.  Dr. Briant Cedar saw  the patient on September 11, 2005.  Dr.  Briant Cedar suggested the use of IV hydration as well as sodium bicarb and  Mucomyst per our protocol prior to catheterization with close monitoring of  serum creatinine.  The patient's lab work stable, taken to the  catheterization lab on September 12, 2005, by Jonelle Sidle, M.D., results  as stated above.  The patient tolerated the procedure without any  complications.  Results of cardiac catheterization discussed with Dr.  Samule Ohm, an interventionalist.  Felt the patient if stable could consider a  PCI to diagonal.  On September 13, 2005, patient found to have Hemoccult-positive  stools with a hemoglobin of 8.1.  Further cardiac intervention was put on  hold.  IV heparin was also discontinued.  GI in to consult on September 13, 2005.  The patient was transfused to keep hemoglobin between 9 and 10 and scheduled  for an EGD on the following day.  Hemoglobin came up to 9.3 status post  transfusion.  Aspirin was continued at a decreased level of dose of 81 mg  daily.  BUN and creatinine hovering around 38 and 1.7.  EGD performed on  September 14, 2005, by Dr. Marina Goodell, results stated above.  The patient tolerated  the procedure without complications, with Dr. Marina Goodell recommending continued  use of PPI.  Cardiology had further discussion regarding possible  intervention.  At this time Dr. Samule Ohm felt the risk of PCI is greater due  to recent GI bleeding and renal insufficiency.  He felt if the patient was  ambulate without any symptoms suggestive of angina that medical therapy  would be in his favor versus PCI at this time.  Also if the patient could  tolerate increased beta blocker and continue low-dose aspirin, this would be  the plan of choice at this time.  On September 15, 2005, Dr. Antoine Poche in to  examine the patient.  Patient ambulating without complaints of angina.  Lab  work:  BUN 29, creatinine 1.7, hemoglobin 9.2 with hematocrit of 27.2.  The patient denying any symptoms  of dizziness, lightheadedness, syncope or  weakness, catheterization site stable.  Patient being discharged home to  follow up:   1.  With blood work on Monday, September 17, 2005.  The patient will have a CBC      drawn by the  lab at [facility].  The CBC results will be faxed      to Dr. Dorethea Clan.  2.  He has a follow-up appointment with Dr. Dorethea Clan in the next 10-14 days.      The patient's wife verbalizes understanding, and she will call Dr.      Marchelle Folks office Monday to schedule this appointment.   The patient has also been given the post cardiac catheterization discharge  instructions.  He has a prescription for nitroglycerin if needed for chest  discomfort.   His new medications include:  1.  The nitroglycerin.  2.  Aspirin 81 mg daily.  3.  Coreg 6.125 mg p.o. b.i.d.   The patient is instructed to resume all his previously-taken medications  including his Synthroid, omeprazole, Prograf, CellCept, prednisone,  Nystatin, Mag-Ox and clomipramine.   Diet as previously instructed by the transplant team.  The patient will try  to follow a low-fat, low-cholesterol, low-sodium diet.   He may shower.  No tub bathing x2 days.  No lifting over 10 pounds x1 week.  No driving for two days.  He has also been given the post cardiac  catheterization discharge instructions.   DURATION OF DISCHARGE ENCOUNTER:  50 minutes.      Dorian Pod, NP    ______________________________  Rollene Rotunda, M.D.    MB/MEDQ  D:  09/15/2005  T:  09/17/2005  Job:  [id number]   cc:   Vida Roller, M.D.  Fax: [phone]   Kirk Ruths, M.D.  Fax: [phone]   Wilber Bihari. Caryn Section, M.D.  Fax: [phone]   Aimee Roman, Transplant Coord.  [facility] [place]

## 2010-11-24 NOTE — Discharge Summary (Signed)
NAMEARVIN, [name]                ACCOUNT NO.:  [id number]   MEDICAL RECORD NO.:  [id number]          PATIENT TYPE:  INP   LOCATION:  A326                          FACILITY:  [facility]   PHYSICIAN:  Corrie Mckusick, M.D.  DATE OF BIRTH:  Oct 04, 1940   DATE OF ADMISSION:  10/26/2004  DATE OF DISCHARGE:  04/23/2006LH                                 DISCHARGE SUMMARY   HISTORY OF PRESENT ILLNESS:  Please see admission H&P.   PAST MEDICAL HISTORY:  Please see admission H&P.   HOSPITAL COURSE:  A 70 year old gentleman with end-stage liver disease due  to NASH, admitted quite frequently due to hepatic encephalopathy.  He was  admitted to the unit for the same with aggressive fluid management.  He  turned around within 24 hours.  Rifaximine was begun once it became  available.  Lasix was continued.  Neomycin will be held.  [facility] was consulted  by Dr. Karilyn Cota once again for hopeful visit in two weeks.  He improved  greatly and was ready for discharge on October 29, 2004.   DISCHARGE PHYSICAL EXAMINATION:  VITAL SIGNS:  Afebrile, vital signs stable.  GENERAL:  Pleasant gentleman sitting up in no acute distress.   DISCHARGE MEDICATIONS:  Same as admission with the holding of Neomycin as  well as the addition of the Rifaximine 200 mg p.o. b.i.d.   FOLLOWUP:  He is to follow up with Dr. Karilyn Cota in two weeks, [facility] in two weeks  as well.   CONDITION ON DISCHARGE:  Improved and stable.       JCG/MEDQ  D:  10/29/2004  T:  10/29/2004  Job:  [id number]

## 2010-11-24 NOTE — Op Note (Signed)
NAME:  [name], [name]                          ACCOUNT NO.:  [id number]   MEDICAL RECORD NO.:  [id number]                   PATIENT TYPE:  AMB   LOCATION:  DAY                                  FACILITY:  [facility]   PHYSICIAN:  R. Roetta Sessions, M.D.              DATE OF BIRTH:  September 20, 1940   DATE OF PROCEDURE:  03/20/2004  DATE OF DISCHARGE:                                 OPERATIVE REPORT   PROCEDURE:  Surveillance esophagogastroduodenoscopy.   INDICATIONS FOR PROCEDURE:  The patient is 70 year old gentleman with NASH  and secondary cirrhosis. He has a known history of grade 2 to 3 esophageal  varices. Last EGD was 2001. He has done well on Inderal. His resting heart  rate is in the 60s. He is here for surveillance examination. The procedure  has been discussed at length with the patient. Potential risks, benefits,  and alternatives have been reviewed. Questions answered. He is agreeable.  Please see documentation in the medical record.   PROCEDURE:  O2 saturation, blood pressure, pulse, and respirations were  monitored throughout the entire procedure. Conscious sedation with Versed 2  mg IV and Demerol 50 mg IV. Cetacaine spray for topical oropharyngeal  anesthesia.   INSTRUMENT:  Olympus video chip system.   FINDINGS:  Examination of the tubular esophagus revealed 4 columns of no  more than grade 2 esophageal varices. Overall mucosa appeared normal. There  was no stigmata of bleeding.  EG junction easily transversed.   Stomach:  The gastric cavity was empty and insufflated well with air.  Thorough examination of the gastric mucosal including reflection to view the  proximal stomach and esophagogastric junction demonstrated mild fish scale  or snake skin appearance of the gastric mucosa. There were also multiple  antral erosions and a 6-mm benign appearing pyloric channel ulcer. Please  see photographs. Pylorus patent and easily transversed. Examination of bulb  and second portion  revealed no abnormalities.   THERAPY/DIAGNOSTIC MANEUVERS:  None.   The patient tolerated the procedure well and was reactive to endoscopy.   IMPRESSION:  1.  Grade 2 esophageal varices; if anything, they look better than seen in      2001. Otherwise normal esophageal mucosa.  2.  Mild changes of snake skin in the gastric mucosa consistent with portal      gastropathy. Multiple antral erosions, and a single pyloric channel      ulcer benign appearance. Otherwise normal gastric mucosa. Normal D1 and      S2.   RECOMMENDATIONS:  1.  Avoid nonsteroidals.  2.  Begin a PPI in the way of Aciphex 20 mg daily.  3.  Check helicobacter pylori serologies.  4.  Office visit with [place] in 6 months.      ___________________________________________  Jonathon Bellows, M.D.   RMR/MEDQ  D:  03/20/2004  T:  03/20/2004  Job:  [id number]   cc:   Madelin Rear. Sherwood Gambler, M.D.  [address]  [place]  [place] [address]  Fax: [phone]

## 2010-11-24 NOTE — Discharge Summary (Signed)
[name], [name]                ACCOUNT NO.:  [id number]   MEDICAL RECORD NO.:  [id number]          PATIENT TYPE:  INP   LOCATION:  A306                          FACILITY:  [facility]   PHYSICIAN:  Kirk Ruths, M.D.DATE OF BIRTH:  [birth year]   DATE OF ADMISSION:  10/09/2004  DATE OF DISCHARGE:  04/07/2006LH                                 DISCHARGE SUMMARY   DISCHARGE DIAGNOSES:  1.  Hepatic encephalopathy.  2.  NASH, nonalcoholic hepatitis.  3.  Type 2 diabetes.  4.  Ascites.   HOSPITAL COURSE:  This 70 year old male well known to our service who has  end-stage liver disease. The patient presented to the hospital with  confusion, agitation. He was found to have an ammonium level of 221. The  patient has presented frequently with similar episodes due to his lack of  liver function is easily tipped into hyperammonemia hepatic encephalopathy.  The patient was admitted to ICU. He was treated with lactulose enemas and  neomycin. The following day, his ammonia level was down to 16. He was alert  and oriented and cooperative. The patient is a candidate for liver  transplant at the [place] [facility] [place]. Underwent several tests in  preparation for such procedure. His INR was 1.6. His white count was 5,400  with a hemoglobin of 11.9. Blood gases are room air were 7.44 with 34 CO2  and 90 O2. The patient's electrolytes were basically within normal range. He  had some albumin low at 2.0. AST was slightly elevated at 56, ALT was normal  at 34. ALP was up at 142. Total bilirubin was 2.1. The patient's AFP tumor  marker was 1.9 which from ______________ normal. BNP was normal at 32. He  underwent paracentesis. Basically, cultures were negative on paracentesis  including AFB. Drug screens were negative. Urinalysis was unremarkable on  admission. Had a few crystals and nitrate positive on repeat. Blood cultures  were negative. The patient underwent adenosine Cardiolite study which was  low  risk and normal. Chest x-ray showed some mild bilateral atelectasis. CT  scan was negative. Ultrasound of his abdomen showed a cirrhotic liver. The  patient also underwent pulmonary function tests, results of which are not  available at the time of this dictation. The patient was stable at the time  of discharge.   He was discharged home on basically his previous medications:  1.  Synthroid 75 mg daily.  2.  Spironolactone 100 mg twice a day.  3.  Lasix 20 mg daily.  4.  Aciphex 20 daily.  5.  Lactulose 30 cc up to 4 times a day.  6.  Zince 1 daily, which is a medication ordered by the [place] [facility]      [place].  7.  Neomycin 5 mg twice a day for 10 days.  8.  Amaryl as before.   DISPOSITION:  He will follow in the office in one to two weeks. The patient  is stable at the time of discharge.      WMM/MEDQ  D:  11/07/2004  T:  11/07/2004  Job:  [id number]

## 2010-11-24 NOTE — H&P (Signed)
NAMEJACOBEY, [name]                ACCOUNT NO.:  [id number]   MEDICAL RECORD NO.:  [id number]          PATIENT TYPE:  INP   LOCATION:  A213                          FACILITY:  [facility]   PHYSICIAN:  Patrica Duel, M.D.    DATE OF BIRTH:  1941-01-06   DATE OF ADMISSION:  11/11/2004  DATE OF DISCHARGE:  LH                                HISTORY & PHYSICAL   CHIEF COMPLAINT:  Obtundation.   HISTORY OF PRESENT ILLNESS:  This is a 70 year old male with known end-stage  cirrhosis secondary to nonalcoholic steatohepatitis and diabetes mellitus.  He is apparently a transplant candidate at [facility] and is followed closely by  Dr. Karilyn Cota and Dr. Jena Gauss as well as ourselves. He was discharged three to  four days ago after having been admitted with recurrent encephalopathy. He  had been given high doses of Haldol in the emergency department and took  several days to wake up. His ammonia levels did respond very promptly to  lactulose and neomycin administration. He was discharged in stable condition  on May 2. History is vague at this time; however, the patient is brought  back to the emergency room with a recurrent obtundation. Apparently, this  was a rapid onset. A workup in the ER revealed a serum ammonia of 298, an  INR of 1.8, mild elevations of LFTs and reasonable chemistries and hemogram.  Arterial blood gas revealed respiratory alkalosis only, pO2 approximately  300 on supplementation.   The patient was admitted with recurrent hepatic encephalopathy. Compliance  issues have been addressed in the past, and there is no history of  noncompliance though the pattern that has been taking place is suggestive of  such.   There is no history of headache or neurological deficits other than  obtundation, nausea, vomiting, diarrhea, melena, hematemesis, hematochezia,  or genitourinary symptoms. Blood sugars have been well controlled.   PAST MEDICAL HISTORY:  As noted above. Also significant for  compensated  hypothyroidism, history of TIP related to cirrhosis and hypertension.   CURRENT MEDICATIONS:  1.  Synthroid 88 q.d.  2.  Spironolactone 100 b.i.d.  3.  Lactulose 15 b.i.d.  4.  Lasix 20 q.a.m.  5.  Propranolol 20 mg b.i.d.  6.  Urso 500 mg 2 b.i.d.  7.  Amaryl 2 mg q.d.  8.  Omeprazole 20 q.d.  9.  Zinc.  10. Fish oil.  11. Vitamin E.  12. Multivitamins.  13. _____________ by Dr. Karilyn Cota last week.   ALLERGIES:  IVP DYE.   SOCIAL HISTORY:  Nonsmoker, nondrinker, very supportive family.   FAMILY HISTORY:  Noncontributory.   REVIEW OF SYSTEMS:  Negative except as mentioned.   PHYSICAL EXAMINATION:  GENERAL:  This is an obtundent male who is  nonresponsive at this time.  VITAL SIGNS:  Stable.  HEENT:  Normocephalic, atraumatic. Pupils are equal. Ears, nose, and throat  are benign.  NECK:  Supple.  LUNGS:  Clear.  HEART:  Sounds normal without murmurs, rubs, or gallops.  ABDOMEN:  Mildly distended and nontender. Bowel sounds are intact.  EXTREMITIES:  No clubbing, cyanosis, or  edema noted.  NEUROLOGICAL:  Nonfocal. There is no ________________ elicited.   PERTINENT LABORATORY DATA:  As noted.   ASSESSMENT:  Recurrent hepatic encephalopathy.   PLAN:  Prompt Neomycin/lactulose administration has been very successful in  the past. Attempted NG placement in the emergency department resulted in  some blood. Further attempts were not tried. Currently, our plan is to ask  GI to attempt NG placement, possibly via endoscopy or administer rectal  lactulose though he is currently so obtundent it is doubtful he would retain  the medications by this approach. We will place a routine sliding scale and  follow and treat expectantly. Possible transfer to [facility] if arrangements are  possible. Will follow and treat expectantly.      MC/MEDQ  D:  11/11/2004  T:  11/11/2004  Job:  [id number]

## 2010-11-24 NOTE — Discharge Summary (Signed)
NAMEVERNE, LANUZA                ACCOUNT NO.:  [id number]   MEDICAL RECORD NO.:  [id number]          PATIENT TYPE:  INP   LOCATION:  IC07                          FACILITY:  [facility]   PHYSICIAN:  Patrica Duel, M.D.    DATE OF BIRTH:  11-Jul-1940   DATE OF ADMISSION:  11/03/2004  DATE OF DISCHARGE:  05/02/2006LH                                 DISCHARGE SUMMARY   DISCHARGE DIAGNOSES:  1.  Recurrent hepatic encephalopathy.  2.  Cirrhosis secondary to nonalcoholic steatohepatitis.  3.  Hypertension.  4.  Diabetes, well controlled with diet.  5.  Hypothyroidism compensated.   For details regarding admission, please refer to the admitting note.  Briefly, this is a 70 year old male with the above history. He has been  admitted frequently with acute episodes of hepatic encephalopathy. He has  responded promptly to appropriate therapy. He is a transplant candidate and  is in close contact with the transplant team at the [facility] [place]  in [place].   The patient was doing quite well the day prior to admission. He was brought  to the emergency department with increasingly severe lethargy and confusion.  His ammonia level was found to be 270. He was quite agitated and  uncontrollable, and Haldol 10 mg was administered by the ER physician. He  has been somnolent since.   COURSE IN THE HOSPITAL:  The patient was brought to the ICU, and the NG tube  was passed. He was administered lactulose and neomycin. He remained  somnolent for several days, probably secondary to the relatively high doses  of Haldol that were administered. His ammonia level normalized. His liver  functions also improved. His mental status returned to baseline on the third  hospital day.   Dr. Jena Gauss has been in close contact with the physicians at the [facility]  [place] in regards to heart transplant. He has an appointment on May 5 for  followup with them. He is currently stable for discharge.   DISPOSITION:  Medications include:  1.  Synthroid 0.075 mg q.d.  2.  Propranolol 10 mg 2 b.i.d.  3.  Lasix 20 q.d.  4.  Spironolactone 100 mg b.i.d.  5.  Lactulose 30 cc q.i.d.  6.  Omeprazole 20 mg q.d. to be followed and treated expectantly as an      outpatient.       MC/MEDQ  D:  12/11/2004  T:  12/11/2004  Job:  [id number]

## 2010-11-24 NOTE — Procedures (Signed)
NAMEDUC, CROCKET                ACCOUNT NO.:  [id number]   MEDICAL RECORD NO.:  [id number]          PATIENT TYPE:  INP   LOCATION:  A306                          FACILITY:  [facility]   PHYSICIAN:  Edward L. Juanetta Gosling, M.D.DATE OF BIRTH:  Apr 05, 1941   DATE OF PROCEDURE:  10/13/2004  DATE OF DISCHARGE:                              PULMONARY FUNCTION TEST   1.  Spirometry shows a moderate to severe ventilatory defect with evidence      of airflow obstruction.  2.  Lung volumes show reduction of total lung capacity suggestive of a      separate restrictive change.  3.  DLCO is severely reduced.  4.  Arterial blood gases are normal.  5.  There is significant bronchodilator effect.      ELH/MEDQ  D:  10/13/2004  T:  10/13/2004  Job:  [id number]

## 2010-11-24 NOTE — Op Note (Signed)
NAME:  [name], [name]                          ACCOUNT NO.:  [id number]   MEDICAL RECORD NO.:  [id number]                   PATIENT TYPE:  AMB   LOCATION:  NESC                                 FACILITY:  [facility]   PHYSICIAN:  Ronald L. Ovidio Hanger, M.D.           DATE OF BIRTH:  11/23/1940   DATE OF PROCEDURE:  09/18/2002  DATE OF DISCHARGE:                                 OPERATIVE REPORT   DIAGNOSES:  1. Severe balanitis.  2. Severe phimosis.  3. Recurrent urinary tract infections.   OPERATIVE PROCEDURE:  1. Circumcision.  2. Flexible cystourethroscopy.   SURGEON:  Lucrezia Starch. Earlene Plater, M.D.   ANESTHESIA:  General laryngeal airway.   BLOOD LOSS:  30 mL.   TUBES:  None.   COMPLICATIONS:  None.   INDICATIONS FOR PROCEDURE:  [name] is a very nice 70 year old white male,  who presented with recurrent urinary tract infections with questionable  prostatitis.  He has been on multiple courses of antibiotics.  He has  congenital liver disease, cirrhosis, and low platelet counts.  He underwent  renal ultrasound that was essentially normal.  However, on examination, he  was found to have a pinhole meatal stenosis and phimosis.  The foreskin  could not even be retracted, and with the pinhole opening, he was having  great difficulty urinating.  After understanding risks. benefits, and  alternatives, he elected to proceed with circumcision and flexible  cystourethroscopy.   PROCEDURE IN DETAIL:  The patient was placed in a supine position and after  proper general endotracheal anesthesia, was prepped and draped with Betadine  in a sterile fashion.  The whole meatus was approached and was dissected  down dorsally after the foreskin had been marked with marking pen.  It was  noted that the foreskin was densely adherent with fibrous adhesions to the  glans penis, and this was carefully dissected, utilizing sharp dissection;  blunt dissection would not open real planes from blunt  dissection.  This was  carefully carried down with extensor dissection down to the corona areata  and extended down eventually to the corpus spongiosum.  The plane was then  reached at Buck's fascia, and this was taken down to mid shaft penis.  The  appropriate foreskin was then excised, and the shaft skin was approximated  to the base of the corona areata and extended with a running 3-0 chromic  catgut.  A U-type stitch was placed in the frenular area and carried below  the meatus.  Again, hemostasis was noted to be present.  Flexible  cystourethroscopy was then performed.  He was noted to have wide caliber  filamentous paraurethral stricture disease.  The prostate was not  significantly enlarged.  The bladder was smooth-walled, and efflux of clear  urine was noted from the normally-placed ureteral orifices bilaterally.  The  bladder was  drained after the flexible cystourethroscope was removed.  The bladder was  drained with a red rubber catheter.  The wound was dressed with Vaseline  gauze, 4 x 4 dressing, and Coban, and the patient was taken to the recovery  room stable.                                               Ronald L. Ovidio Hanger, M.D.    RLD/MEDQ  D:  09/18/2002  T:  09/18/2002  Job:  [id number]

## 2010-11-24 NOTE — H&P (Signed)
[name], [name] NO.:  [id number]   MEDICAL RECORD NO.:  [id number]          PATIENT TYPE:  INP   LOCATION:  6526                         FACILITY:  [facility]   PHYSICIAN:  [name], M.D. LHCDATE OF BIRTH:  August 16, 1940   DATE OF ADMISSION:  09/11/2005  DATE OF DISCHARGE:  06/15/2005                                HISTORY & PHYSICAL   SUMMARY OF HISTORY:  [name] is a 70 year old white male who was  transferred from [facility] to [facility] for  further evaluation of chest discomfort. He stated he was awakened at  approximately 4:15 this morning with anterior chest pressure sensation. This  discomfort did not radiate nor was it associated with shortness of breath,  nausea, vomiting, or diaphoresis. He had a 4 on a scale of 0/10. He went to  use the bathroom, and the discomfort did not change with exertion. He lay  down and notified his wife. His wife stated that her husband never complains  about pain or discomfort. She drove him to the emergency room. In the  emergency room, he stated that the discomfort was still a 4. After receiving  3 sublingual nitroglycerin, his discomfort went to approximately a 1 on 0/10  and eventually resolved to the point that he fell asleep.   At the time of transfer, he states that the discomfort is approximately 1/2  on a scale of 0/10. He states that this does not change with movement nor  with deep breathing. He denies prior occurrences.   PAST MEDICAL HISTORY:  He is allergic to IVP DYE. He states that in the 70s  he received this and developed a rash. He states that he has received IV DYE  since that time, and he has not had any reactions and, he does not think he  had been premedicated.   MEDICATIONS:  Include:  1.  Synthroid 1 mg daily.  2.  Omeprazole 20 mg daily.  3.  Multivitamin daily.  4.  Nystatin 0.5 mg twice daily.  5.  Mag-Ox 400 mg twice daily.  6.  Clomipramine 2 mg q.a.m.  7.   A sliding scale Glucomander with Humulin insulin at home.  8.  Prograf 3 mg in the morning, 2 mg at bedtime.  9.  CellCept 250 mg 4 tablets twice daily.  10. Prednisone 5 mg daily.   PAST MEDICAL HISTORY:  Notable for:  1.  Hypothyroidism.  2.  Hypertension in which his blood pressure at home ranges of from 140-      158/70-80  3.  Chronic renal insufficiency post transplant on July 16, 2005.  BUN and      creatinine were 90 and 3.2, respectively.  On July 30, 2005, BUN and      creatinine were 36 and 2.1, respectively.  4.  Type 2 diabetes. Blood sugars at home range from 70s to 230s.  5.  Normocytic anemia. On July 16, 2005, hemoglobin was 10.4 and      hematocrit 30.5. On January 22, 9.9 and 29.5, respectively.  6.  He  is status post liver transplant on July 07, 2005, secondary to      cirrhosis at the [facility] [place]. Preoperative adenosine Myoview      in April 2006 showed EF of 71% and no ischemia. Preoperative PFTs in      December 2006 showed moderate obstruction, but this is improved from      PFTs in April 2006. Echocardiogram in April 2006 showed EF of 55-60%,      mild LVH, and had a negative bubble study. He does not know the status      of his lipids.   SOCIAL HISTORY:  He resides in [place] with his wife. He has one daughter  and one grandchild. One daughter is deceased secondary to diabetic  complications. He is currently disabled and, prior to his disability, was an  Public affairs consultant. He denies any history of tobacco use, alcohol use, drug  use, herbal medications. He maintains a low-sodium, ADA diet. He  participates in rehab two times a week at [facility].   FAMILY HISTORY:  His mother is deceased at age of 32 with lymphoma, his  father at the age of 55 with liver problems and history of diabetes. He has  one brother alive and well. Sister alive and well with diabetes.   REVIEW OF SYSTEMS:  Is notable for a 30-pound weight loss  since his  discharge from the hospital on July 16, 2004. He states that his teeth  have some cold sensitivity. He is due to follow with Dr. Delman Kitten.  Occasional dyspnea with exertion associated with heavy exercise. However, he  feels that this has improved in the last several months. Rare nocturia.  Bilateral knee arthralgias in the cold weather. All remaining systems were  negative.   PHYSICAL EXAMINATION:  GENERAL: Well-nourished, well-developed, obese white  male in no apparent distress. Wife is present.  VITAL SIGNS: Temperature is 98.8, blood pressure 126.6, pulse 68 and  regular, respirations 20, 99% saturation on room air.  HEENT:  Is essentially unremarkable.  NECK: Supple without thyromegaly, adenopathy, JVD. I am not sure if he has a  low right carotid bruit.  HEART:  PMI is not displaced. Regular rate and rhythm. I do not appreciate  murmurs, rubs, clicks, or gallops.  LUNGS: Symmetrical excursion. Clear to auscultation. Right basilar breath  sounds are diminished compared to the left.  SKIN:  Integument is intact without lesions or rashes. He does have an  incision that extends across his abdomen. There is a dressing in the right  upper quadrant for some drainage per the patient. Recent cultures have been  negative.  ABDOMEN: Obese. Bowel sounds present without organomegaly, masses or  tenderness.  EXTREMITIES: Negative cyanosis, clubbing or edema.  MUSCULOSKELETAL:  Unremarkable.  NEUROLOGIC: Unremarkable.   Chest x-ray shows decreased lung volumes   EKG shows normal sinus rhythm, normal axis, normal intervals, decreased  voltage, nonspecific changes. Old EKGs are not available for comparison.   Hemoglobin was 9.4, hematocrit 29, platelets 149, WBC 6.6. Sodium 140,  potassium 4.1, BUN 40, creatinine 1.9, glucose 91 normal LFTs. ER markers  were negative x2.   IMPRESSION:  1.  Acute coronary syndrome.  2.  Hypertension. 3.  Chronic renal insufficiency  which is stable.  4.  Normocytic anemia, stable.  5.  IVP DYE allergy.  6.  Status post liver transplant secondary to a cirrhosis, history as noted      per past medical history.   DISPOSITION:  Dr. Jens Som reviewed the patient's history, spoke with and  examined the patient. Given his symptoms and multiple risk factors, it is  felt that he should proceed with cardiac catheterization for further  evaluation. Dr. Jens Som spoke with the transplant coordinator at [facility] [place] and discussed aspirin, antiplatelet agents, and the  catheterization procedure. He stated that the patient can be on antiplatelet  medications; however, if a stent is required, we should use a bare metal  stent as, if the patient may require liver biopsies in the future, he will  need to be off all antiplatelet agents. Will start IV heparin, aspirin as  well as a beta blocker. We have contacted Dr. Briant Cedar with [facility]  to assist with renal protection in regards to a cardiac catheterization. We  will also plan to premedicate with regards to his history of IV DYE allergy.  We will check his lipids and TSH as well as coags.      Joellyn Rued, P.A. [facility]    ______________________________  [name], M.D. [facility]    EW/MEDQ  D:  09/11/2005  T:  09/11/2005  Job:  [id number]   cc:   Kirk Ruths, M.D.  Fax: [phone]   Vida Roller, M.D.  Fax: [phone]   Wilber Bihari. Caryn Section, M.D.  Fax: [phone]   Aimee Roman, [facility] [place]  Transplant Coordinator   R. Roetta Sessions, M.D.  [address]  [place]   [address]

## 2010-11-24 NOTE — Consult Note (Signed)
NAMEKELSIE, KRAMP                ACCOUNT NO.:  [id number]   MEDICAL RECORD NO.:  [id number]          PATIENT TYPE:  INP   LOCATION:  A340                          FACILITY:  [facility]   PHYSICIAN:  Lionel December, M.D.    DATE OF BIRTH:  [birth year]   DATE OF CONSULTATION:  04/10/2004  DATE OF DISCHARGE:                                   CONSULTATION   REQUESTING PHYSICIAN:  Kirk Ruths, M.D.   REASON FOR CONSULTATION:  Recurrent hepatic encephalopathy, nonalcoholic  steatohepatitis (NASH)   HISTORY OF PRESENT ILLNESS:  Mr. Westberg is a 70 year old Caucasian male with  history of NASH cirrhosis who was brought to the emergency room this morning  by his wife.  He recently had a length of stay at [facility] for  hepatic encephalopathy back on September 19.  She notes he was given Ambien  10 mg tablets on September 29.  He had taken 1/2 tablet for insomnia.  He  was also taking some as-needed Darvocet once or twice per day for some knee  pain as well.  Over the weekend, Mr. Passey became more disoriented.  This  morning he became combative.  They also noted a slight amount of blood-  tinged emesis, and he does have a history of frequent gum bleeding and  thrombocytopenia.  He underwent EGD on September 12 by Dr. Jena Gauss.  He was  found to have grade II esophageal varices as well as portal gastropathy.  He  had been maintained on Inderal 10 mg t.i.d. at home.  The family notes he  complained of stomach cramps yesterday.  Last night at around 7 is when he  became more confused.  He has been given Haldol in the emergency room today.  He is also taking spirolactone 25 mg b.i.d. at home as well.  Ammonia today  is 137.   He has been seen at [facility] by Dr.  Allena Katz.  He is not currently on transplant list; however, we will need to  discuss this further with his transplant coordinator.   PAST MEDICAL HISTORY:  1.  Hypertension.  2.  Diabetes  mellitus for two years.  3.  Colonoscopy with polypectomy in February 2004.  4.  NASH cirrhosis currently on Inderal with history of grade II to III      esophageal varices with EGD on March 20, 2004, by Dr. Jena Gauss with      portal gastropathy and multiple antral erosions, single pyloric channel      ulcer, severe balanitis phimosis and frequent UTIs requiring      circumcision in March 2004.   MEDICATIONS PRIOR TO ADMISSION:  1.  Inderal 10 mg t.i.d.  2.  Spirolactone 25 mg b.i.d.  3.  Synthroid 75 mcg daily.  4.  Aciphex 20 mg daily.  5.  Darvocet once or twice daily.  6.  Lasix as needed.   ALLERGIES:  IVP DYE.   REVIEW OF SYSTEMS:  Unable to obtain as patient is combative and restrained  at this time.  His family does note  he did have several loose stools  yesterday and was complaining about diarrhea.  Also complaining of some  abdominal cramping. Denies any history of shortness of breath or chest pain.  Was complaining of some insomnia, has been sleeping off and on during the  day, and wife felt he was sleeping adequately at night.   PHYSICAL EXAMINATION:  VITAL SIGNS:  Blood pressure 135/70, pulse 78,  respirations 16.  GENERAL:  Mr. Wiltsey is an obese Caucasian male lying supine in bed with soft  wrist restraints intact.  He continuously moans throughout the exam.  Both  wife and sister-in-law are at bedside.  HEENT:  Sclerae clear, nonicteric.  PERRLA.  Conjunctivae pink.  Oropharynx  pink and moist without any lesions.  NECK:  Supple without mass or thyromegaly.  HEART:  Regular rate and rhythm with normal S1 and S2.  LUNGS:  Clear to auscultation bilaterally, though exam is limited due to  patient being restrained.  ABDOMEN:  Positive bowel sounds x 4, protuberant.  No bruits auscultated.  Soft, nontender, nondistended, without palpable mass.  Liver is palpable  four fingers below the right costal margin.  He does have splenomegaly.  EXTREMITIES: 2+ pedal pulses  bilaterally.  No edema.  SKIN:  Pink, warm, and dry.  He does have multiple ecchymotic areas  bilateral upper extremities   LABORATORY AND X-RAY DATA:  WBC 6.4, hemoglobin 13.7, hematocrit 40.4,  platelets 75.  PT 17.3, INR 1.6.  Calcium 8.8, sodium 134, potassium 4.7,  chloride 103, CO2 25, BUN 15, creatinine 0.9, glucose 248, total bilirubin  1.8, direct 0.4, indirect 1.4, alkaline phosphatase 177, SGOT 78, SGPT 78,  total protein 7.6, albumin 2.6, serum ammonia 137.   IMPRESSION:  Mr. Dunwoody is a 70 year old Caucasian male with recurrent  hepatic encephalopathy and decompensated nonalcoholic steatohepatitis (NASH)  cirrhosis.  Apparently he is unable to tolerate most medications metabolized  extensively by the liver, especially narcotics.  Ammonia today is elevated  at 137.  He had been seen in conjunction about two weeks ago with a similar  episode, although ammonia at that time was 56.  He may have had a small  amount of hematemesis, although hemoglobin is stable.  He does have  frequently bleeding gums which more than likely is the source of this small  amount of blood.  He does have thrombocytopenia, hypoalbuminemia as well.   RECOMMENDATIONS:  1.  Agree with Lactulose 2 tablespoons t.i.d.  2.  Avoid narcotics if at all possible.  3.  Foley catheter.  4.  Will check serum ammonia in the morning.  5.  Dr. Karilyn Cota to contact Dr. Allena Katz to further discuss orthotic liver      transplantation.   We would like to thank Dr. Regino Schultze for allowing [place] to participate in the  care of Mr. Paullin.     Kand   KC/MEDQ  D:  04/10/2004  T:  04/10/2004  Job:  [id number]   cc:   Kirk Ruths, M.D.  [address]  [place]  [place] [address]  Fax: [phone]

## 2010-11-24 NOTE — Procedures (Signed)
[name], [name]                ACCOUNT NO.:  [id number]   MEDICAL RECORD NO.:  [id number]          PATIENT TYPE:  INP   LOCATION:  A306                          FACILITY:  [facility]   PHYSICIAN:  Vida Roller, M.D.   DATE OF BIRTH:  [birth year]   DATE OF PROCEDURE:  10/09/2004  DATE OF DISCHARGE:                                  ECHOCARDIOGRAM   PRIMARY CARE PHYSICIAN:  Kirk Ruths, M.D.   TAPE NUMBER:  [id number].   TAPE COUNT:  [id number] through 3691.   HISTORY OF PRESENT ILLNESS:  This is a 70 year old man.  We were requested  to do a bubble study as a preoperative evaluation for liver transplantation.  No previous studies are available.   STUDY:  The technical quality of the study is limited.  There are only  apical 4 chamber views available.  It appears that the left ventricular  systolic function is estimated to be about 50% to 55%.  There were  injections of agitated saline contrast, which opacified the right ventricle  relatively well.  There was no evidence of a negative contrast jet, and  there was no evidence of contrast seen in the left heart, either the left  atrium or the left ventricle.   Therefore, no evidence of intracardiac shunt.      JH/MEDQ  D:  10/10/2004  T:  10/10/2004  Job:  [id number]

## 2010-11-24 NOTE — H&P (Signed)
[name], [name]                ACCOUNT NO.:  [id number]   MEDICAL RECORD NO.:  [id number]          PATIENT TYPE:  EMS   LOCATION:  ED                            FACILITY:  [facility]   PHYSICIAN:  Madelin Rear. Sherwood Gambler, MD  DATE OF BIRTH:  [birth year]   DATE OF ADMISSION:  07/24/2004  DATE OF DISCHARGE:  LH                                HISTORY & PHYSICAL   CHIEF COMPLAINT:  Mental status changes.   HISTORY OF PRESENT ILLNESS:  The patient was in his usual state of health  having dinner last night with his wife and being totally appropriate, but he  awoke this morning and became confused, disoriented and agitated.  He had no  injury, falls or seizure activity and no loss of consciousness.  He had no  antecedent complaints except his wife has noticed increase in abdominal  girth over the past few days to a week.  He was seen at the [facility] and  had his medications refilled.   PAST MEDICAL HISTORY:  1.  Recurrent hepatic encephalopathy.  2.  Nonalcoholic steatohepatitis as the underlying etiology of his liver      failure.  3.  He was at one time consider a possible candidate for a liver transplant,      although this was denied by the specialist.  4.  Previous history of type 2 diabetes.  5.  Hypertension.  6.  Hypothyroidism.   SOCIAL HISTORY:  Nonsmoker, nondrinker.  Lives with his wife who is  appropriately concerned and supportive.   FAMILY HISTORY:  Noncontributory for this illness.   REVIEW OF SYSTEMS:  Under HPI, all else is negative.   PHYSICAL EXAMINATION:  VITAL SIGNS:  He had received several doses of Haldol  in the emergency department with hard restraints being necessary due to his  severe agitation.  They were unable to obtain a temperature.  HEENT:  Head and neck shows pallor of the skin and slight icterus.  Pupillary reflexes are normal, but he has a wondering gaze after Haldol.  No  nystagmus or disconjugate gaze noted.  CHEST:  Clear.  CARDIAC:  Regular  rate and rhythm without murmurs, rubs or gallops.  ABDOMEN:  Soft, protuberant with spider angiomata also noted on the skin  surface.  EXTREMITIES:  Trace by pedal edema.  NEUROLOGIC:  Obtunded secondary to sedatives, but grossly nonfocal.  Appears  to be move all four extremities well and there is no facial asymmetry.  Cranial nerves 2-12 grossly intact as described above.   LABORATORY DATA AND X-RAY FINDINGS:  Normal lipase and CBC with a mild  anemia of 12.2 g hemoglobin, platelet count diminished at 84,000, white  count 5.6.  He is noted to have 82% neutrophils as well.  His lipase was  normal.  Liver function tests revealed modest elevations with an SGOT of 66  and SGPT of 46, 37 and 40 respectively as upper limits of lab normals, Alk  phos elevated at 165, upper limit 117, bilirubin elevated at 2.3.  Ammonia  level elevated at 96.  Electrolytes reveal mild  hyponatremia at 133, but  normal potassium, normal BUN and creatinine, modest hyperglycemia at 186.  Hypoalbuminemia at 2.3 is also noted.   X-rays were not obtainable at the time of evaluation secondary to agitation.   ASSESSMENT/PLAN:  1.  Mental status changes most likely secondary to hepatic encephalopathy.      Due to his state with known previous esophageal varices, a nasogastric      tube will be avoided and Lactulose will be administered per rectum with      retention enemas.  2.  Increased abdominal girth, suggests possibly tipped over by a subacute      bacterial peritonitis.  We will have gastroenterology evaluate this and      cover with antibiotics presumptively pending blood cultures and further      studies.  3.  Hypertension.  Manage as needed.  4.  Diabetes mellitus.  Sliding scale for now.  5.  Hyponatremia.  Intravenous fluids to be started, monitored and diuretics      as needed to increase free water clearance.     Lawr   LJF/MEDQ  D:  07/24/2004  T:  07/24/2004  Job:  [id number]

## 2010-11-24 NOTE — H&P (Signed)
[name], [name]                ACCOUNT NO.:  [id number]   MEDICAL RECORD NO.:  [id number]          PATIENT TYPE:  INP   LOCATION:  IC06                          FACILITY:  [facility]   PHYSICIAN:  Kirk Ruths, M.D.DATE OF BIRTH:  [birth year]   DATE OF ADMISSION:  10/09/2004  DATE OF DISCHARGE:  LH                                HISTORY & PHYSICAL   CHIEF COMPLAINT:  Unresponsive.   PRESENTING PROBLEM:  This is a 70 year old male who has end-stage liver  disease (NASH).  The patient had a normal day yesterday.  Was normal at  bedtime, but unarousable this morning.  The patient very brittle.  As far as  his ammonia goes, he is on large doses of lactulose daily to help prevent  hepatic encephalopathy with increased ammonia.  On this occasion, his  ammonia is 221 with upper limits of normal being 35.  The patient will be  admitted to ICU, given a lactulose enema and Neomycin.  The patient is being  evaluated currently at the [facility] for a liver transplant.   ALLERGIES:  The patient is allergic to IVP DYE.   CURRENT MEDICATIONS:  1.  Synthroid 75 mcg daily for hypothyroidism.  2.  Inderal 2 mg b.i.d.  3.  Lasix 40 mg p.r.n.  4.  Spironolactone 25 mg b.i.d.  5.  Aciphex 20 mg daily.  6.  Vitamin E alpha.  7.  Multivitamins daily.   The patient has hypothyroidism, type 2 diabetes.  Also has a history of ITP  related to his cirrhosis and hypertension.   REVIEW OF SYSTEMS:  Noncontributory according to his family.   PHYSICAL EXAMINATION:  Elderly, obese male who is unresponsive, although  somewhat combative.  VITAL SIGNS:  O2 saturations are 99% on room air.  He is afebrile.  Blood  pressure 130/80, pulse 68 and regular.  Respirations are 24 and unlabored.  HEENT:  TMs are normal.  Pupils equal, react to light and accommodation.  Oropharynx benign.  NECK:  Supple without JVD, bruit or thyromegaly.  LUNGS:  Clear in all areas.  HEART:  Regular without murmur,  gallop or rubs.  ABDOMEN:  Distended.  Mildly tympanitic.  No masses or tenderness.  EXTREMITIES:  Without cyanosis, clubbing or edema.  NEUROLOGIC:  There is a mild hand flap.  SKIN:  Normal color without icterus.   ASSESSMENT:  1.  Hyperammonemia with hepatic encephalopathy, recurrent.  2.  End-stage liver disease.  3.  Hypertension.  4.  Type 2 diabetes.      WMM/MEDQ  D:  10/09/2004  T:  10/09/2004  Job:  [id number]

## 2010-11-24 NOTE — H&P (Signed)
NAMEBRUCE, [name]                          ACCOUNT NO.:  [id number]   MEDICAL RECORD NO.:  [id number]                   PATIENT TYPE:  INP   LOCATION:  IC06                                 FACILITY:  [facility]   PHYSICIAN:  Kirk Ruths, M.D.            DATE OF BIRTH:  July 12, 1940   DATE OF ADMISSION:  03/26/2004  DATE OF DISCHARGE:                                HISTORY & PHYSICAL   CHIEF COMPLAINT:  Confusion.   PRESENTING ILLNESS:  This is a 70 year old gentleman who had been confused  day of admission.  Patient was also combative, finally had gotten to the  emergency room by family, labs revealed an elevated ammonia level of 56 with  upper limits of normal being 35.  Patient has idiopathic cirrhosis of the  liver but has never had a problem with elevated ammonia before.  He is  confused and combative at time of this admission and having a problem with  him taking lactulose as well as trying to get IV established and sedation  established.   PAST MEDICAL HISTORY:  In addition to his idiopathic cirrhosis, he has  history of hypertension, type 2 diabetes.   ALLERGIES:  Patient is allergic to IVP DYE.   CURRENT MEDICATIONS:  1.  Lasix 40 mg p.r.n.  2.  Spironolactone 25 mg b.i.d.  3.  Synthroid 75 mcg daily.  4.  Oxycodone 5 mg p.r.n. for back pain.  5.  Inderal 10 mg t.i.d.  6.  He is currently on a tapering prednisone dosage at this time.   REVIEW OF SYSTEMS:  Difficult to ascertain due to patient's confusion.  Patient is a nonalcohol user.   PHYSICAL EXAMINATION:  GENERAL:  Obese elderly male who is combative and  confused.  VITAL SIGNS:  He is afebrile.  Blood pressure is 120/80, pulse is 100 and  regular, respirations 20 and unlabored.  HEENT:  TMs are normal.  Pupils equal to light and accommodation.  Oropharynx benign.  NECK:  Supple without JVD, bruit, or thyromegaly.  LUNGS:  Clear in all areas.  HEART:  Heart with a regular sinus rhythm without murmur,  gallop, or rub.  ABDOMEN:  Pendulous but nontender without masses or organomegaly.  EXTREMITIES:  Without clubbing, cyanosis, or edema.  NEUROLOGIC:  Grossly intact except for the above-mentioned confusion and  combativeness.   LABORATORIES:  Patient's labs showed a sugar of 200, otherwise normal  electrolytes.  His white count is 9000, hemoglobin is 14.  Initial cardiac  enzymes are negative also.   ASSESSMENT:  1.  Confusion secondary to elevated ammonia.  2.  Idiopathic cirrhosis causing elevated ammonia.  3.  Type 2 diabetes.  4.  Hypertension.   PLAN:  We will try to sedate patient but at same time try to get some  lactulose started.      WMM/MEDQ  D:  03/26/2004  T:  03/26/2004  Job:  [id number] 

## 2010-11-24 NOTE — H&P (Signed)
NAMESHAHIN, KNIERIM                ACCOUNT NO.:  [id number]   MEDICAL RECORD NO.:  [id number]          PATIENT TYPE:  INP   LOCATION:  IC03                          FACILITY:  [facility]   PHYSICIAN:  Corrie Mckusick, M.D.  DATE OF BIRTH:  Aug 17, 1940   DATE OF ADMISSION:  10/26/2004  DATE OF DISCHARGE:  LH                                HISTORY & PHYSICAL   ADMISSION DIAGNOSES:  Unresponsive.   HISTORY OF PRESENT ILLNESS:  A 70 year old gentleman with end stage liver  disease due to NASH, who was normal yesterday and presented unresponsive. He  was found unarousable. The patient is very brittle and recently they  admitted on October 09, 2004 by Dr. Regino Schultze. He had been taking large doses of  Lactulose to prevent hepatic encephalopathy. Once again, he came in with the  exact presentation. He is being evaluated by both [facility] and [facility] for a liver transplant.   PAST MEDICAL HISTORY/PAST SURGICAL HISTORY/MEDICATIONS/ALLERGIES:  Are  exactly as same from the history and physical on October 09, 2004. Please see  the history and physical for details.   PHYSICAL EXAMINATION:  VITAL SIGNS:  Temperature 96.9, blood pressure  122/69, pulse 64, respiratory rate 24, O2 saturation 98% on room air.  GENERAL:  When I saw him, he was a little more alert but he had already had  his dose of Lactulose.  HEENT:  Nose and pharynx clear with moist mucous membranes.  NECK:  Supple. No adenopathy.  CHEST:  Clear to auscultation.  CARDIOVASCULAR:  Normal S1 and S2.  ABDOMEN:  Distended. Slightly tympanitic tenderness.  EXTREMITIES:  No clubbing, cyanosis, or edema.   LABORATORY DATA:  CBC:  White count 6.4, hematocrit 38.8, platelets 96,000.  Sodium 132, potassium 4.9, chloride 106, bicarb 21, glucose 154, BUN 20,  creatinine 1.0, total bilirubin is 2.2, alkaline phosphatase 155, AST 58,  ALT 38, ammonia 133.   ASSESSMENT:  A 70 year old gentleman with end stage liver  disease due to  nonalcoholic steatohepatitis with hepatic encephalopathy.   PLAN:  1.  Admit to the unit for aggressive monitoring.  2.  Intravenous hydration.  3.  Lactulose.  4.  Consult GI.  5.  Will continue to follow him closely.       JCG/MEDQ  D:  10/26/2004  T:  10/26/2004  Job:  [id number]

## 2010-11-24 NOTE — H&P (Signed)
[name], [name]                ACCOUNT NO.:  [id number]   MEDICAL RECORD NO.:  [id number]          PATIENT TYPE:  EMS   LOCATION:  ED                            FACILITY:  [facility]   PHYSICIAN:  Kirk Ruths, M.D.DATE OF BIRTH:  [birth year]   DATE OF ADMISSION:  04/10/2004  DATE OF DISCHARGE:  LH                                HISTORY & PHYSICAL   CHIEF COMPLAINT:  Confusion.   PRESENTING ILLNESS:  This is a 70 year old male with a history of idiopathic  cirrhosis.  The patient was brought in the emergency room today for  progressive confusion and some combativeness.  Also was noted to have a  little slight blood-tinged vomitus dried to his mouth.  The patient was  recently admitted two weeks ago for an episode of hyperammonemia.  Ammonia  was up to around 75 to my memory at that time.  It was precipitated by  treating the patient's back pain with some hydrocodone.  The patient was  discharged home back in his regular condition.  He was seen in the office on  September 29 complaining of insomnia and was treated with Ambien.  The  patient took a half of an Ambien on that evening and had been somnolent the  rest of the weekend and even though his lactulose dose was increased, in the  emergency room it was found to be 137 today.  He is admitted for confusion  and hyperammonemia related to cirrhosis, possible esophageal varices bleed.   PAST MEDICAL HISTORY:  He is allergic to IVP DYE.   He takes:  1.  Inderal 10 mg t.i.d.  2.  Spironolactone 25 mg b.i.d.  3.  75 mcg of Synthroid.  4.  20 mg of Aciphex.  5.  Darvocet twice a day.  6.  Lasix p.r.n.   REVIEW OF SYSTEMS:  Denies nausea or vomiting, chest pain, abdominal pain.  He has had some diarrhea related to lactulose this weekend.   PHYSICAL EXAMINATION:  GENERAL:  A robust, elderly male who is somnolent at  the time of this exam, having had injection of Haldol.  VITAL SIGNS:  He is afebrile, blood pressure is 135/70,  respirations 16 and  unlabored, pulse of 78 and regular.  HEENT:  TMs normal.  Pupils equal and reactive to accommodation.  Oropharynx  benign.  NECK:  Supple without JVD, bruit, or thyromegaly.  CHEST:  Lungs are clear in all areas.  CARDIAC:  Regular sinus rhythm without murmur, gallop, or rub.  ABDOMEN:  Pendulous, nontender, without palpable masses.  EXTREMITIES:  Without clubbing, cyanosis, or edema.  NEUROLOGIC:  Grossly intact.   The patient's labs showed a white count of 6400, hemoglobin 13.7.  PT/PTT  within normal range.  LFTs slightly elevated with an OT of 78 and a PT of  80.  Total bilirubin is 1.8.  The patient's electrolytes were normal.  Sugar  is 248.   ASSESSMENT:  1.  Hyperammonemia secondary to cirrhosis.  2.  Possible esophageal varices bleed.   PLAN:  1.  Will monitor CBC, ammonia.  2.  Start NG tube for medications as well as lactulose treatment.  3.  GI consult.  4.  Monitor blood sugars.      WMM/MEDQ  D:  04/10/2004  T:  04/10/2004  Job:  [id number]

## 2010-11-24 NOTE — Consult Note (Signed)
NAMEHANDSOME, ANGLIN                ACCOUNT NO.:  [id number]   MEDICAL RECORD NO.:  [id number]          PATIENT TYPE:  INP   LOCATION:  A213                          FACILITY:  [facility]   PHYSICIAN:  Stana Bunting, M.D.  DATE OF BIRTH:  10/14/1940   DATE OF CONSULTATION:  11/11/2004  DATE OF DISCHARGE:                                   CONSULTATION   We were asked to see [name] in consultation by Dr. Patrica Duel for  further evaluation of hepatic encephalopathy.   HISTORY OF PRESENT ILLNESS:  [name] is a 70 year old white male with known  hepatic cirrhosis felt secondary to NASH. He has had multiple admissions to  the hospital for hepatic encephalopathy, have most recently been discharged  this past Tuesday (May 2). He, however, has not had problems in the past  with SBP or variceal bleeding, although he is known to have esophageal  varices. His family (wife and daughter) are with him today and note that  since he was discharged on Tuesday he had been doing fairly well. He went to  the [facility] in [place] on Thursday and underwent an  extensive evaluation there as part of his workup for liver transplantation.  At that time, he was lucid and did not have any difficulties. He came home  and per his family was doing well yesterday. They note he had no complaints  and was not having problems with constipation. He ate dinner last night and  went to bed. They had difficulty awaking him from sleep this morning and  subsequently had him brought to the hospital. They do report that he had had  a bowel movement while he was in bed this morning and described that as a  brown bowel movement. There was no evidence of blood or melena. In the  emergency department, the patient continued to be lethargic. A NG was  attempted to be placed but was unsuccessful. He was subsequently admitted to  the floor.   At the present time, [name] is unable to provide any history. The  family  denies him complaining of any pain or having any cough or dysuria. They do  note that when he was in the hospital earlier this week he had a Foley  catheter in. He has been eating a regular diet and has not begun any new  medications. There are otherwise no acute medical issues. Of note, this  episode is very similiar to previous episodes of SBP.   PAST MEDICAL HISTORY:  1.  Cirrhosis.  2.  Recurrent hepatic encephalopathy.  3.  Diabetes mellitus which the family reports was diagnosed earlier this      year.  4.  Hypothyroidism.  5.  Gout.  6.  Nephrolithiasis.   PAST SURGICAL HISTORY:  None.   CURRENT MEDICATIONS:  Lactulose and neomycin.   I have reviewed the medications he is taking at home, and this include:  1.  Xifaxin 200 mg twice daily.  2.  Synthroid.  3.  Aldactone.  4.  Lactulose.  5.  Lasix.  6.  Propranolol.  7.  Urso.  8.  Glimepiride.  9.  Prilosec.  10. Zinc.  11. Fish oil.  12. Multivitamin.   ALLERGIES:  IODINE.   SOCIAL HISTORY:  He is married. Lives here in [place]. He is on  disability for the last few years. There is no history of alcohol abuse. He  does not smoke.   FAMILY HISTORY:  Significant for his father having cirrhosis. His father,  however, per report was not a drinker. No one else in the family is known to  have liver disease.   REVIEW OF SYSTEMS:  A complete review of systems was unable to be performed  secondary to the patient's mental status.   PHYSICAL EXAMINATION:  VITAL SIGNS:  Temperature 98.4, pulse 80,  respirations 24, blood pressure 140/82.  GENERAL:  This is a large white male who is alert and oriented. He is  lethargic.  HEENT:  Sclerae are anicteric. He does have xanthelasma.  NECK:  No masses.  CARDIOVASCULAR:  Regular rate and rhythm.  CHEST:  Clear to auscultation bilaterally.  ABDOMEN:  Very obese. I do not appreciate any masses. It is difficult to  appreciate any splenomegaly. I do not believe he has  ascites although his  exam is limited by his weight.  EXTREMITIES:  He does have 1+ bilateral lower extremity edema.  SKIN:  No spider angioma.   LABORATORY DATA:  Shows a hemoglobin of 13.2, white count 7.3, platelet  count of 90. His INR is 1.8. Comprehensive metabolic panel shows a sodium of  131, BUN of 19, creatinine 1.1. Total bilirubin is 2.2, alkaline phosphatase  160, AST 63, ALT 46, albumin 2.1. An alcohol level was checked and is  undetectable. His INR is 1.8 and PT is 17.8. An ammonia is 298; for  comparison when he was discharged on April 23, his ammonia was 67. A  urinalysis shows a specific gravity of 1.025, negative for ketones or  bilirubin. He does have some nitrite noted. Microscopic exam shows a rare  squamous epithelial cell. He does have 3 to 6 WBCs and many bacteria noted.  A review of imaging does show that the patient underwent an abdominal  ultrasound in April of this year, and this did not show any evidence of a  mass lesion within the liver. A recent CT scan of the head done on April 20  showed no acute abnormality. A chest x-ray done today shows low lung volumes  but no evidence of pneumonia. He does have stable cardiomegaly.   IMPRESSION AND PLAN:  [name] is a 70 year old male with cirrhosis felt  secondary to NASH. He at the present time presents with altered mental  status, likely representing hepatic encephalopathy. The etiology is not  clear at this time, although it does not appear that he is having a GI  bleed. He is afebrile and hemodynamically stable, and there are no  significant electrolyte abnormalities. It is concerning that he has the  possibility of a urinary tract  infection. I will have a urine culture sent and subsequently start him on  antibiotics for this. Additionally, we will place him on lactulose enemas for the time being. Should his symptoms not improve, we will need to advance  therapy. We will follow him closely.       RSS/MEDQ  D:  11/11/2004  T:  11/11/2004  Job:  [id number]

## 2010-11-24 NOTE — H&P (Signed)
NAMEFAIZAAN, FALLS                ACCOUNT NO.:  [id number]   MEDICAL RECORD NO.:  [id number]          PATIENT TYPE:  INP   LOCATION:  IC10                          FACILITY:  [facility]   PHYSICIAN:  Corrie Mckusick, M.D.  DATE OF BIRTH:  1940-09-03   DATE OF ADMISSION:  07/02/2005  DATE OF DISCHARGE:  LH                                HISTORY & PHYSICAL   ADMISSION DIAGNOSIS:  Hepatic encephalopathy.   HISTORY OF PRESENT ILLNESS:  This is a 70 year old gentleman well-known to  the practice with end-stage cirrhosis secondary to nonalcoholic  steatohepatitis and diabetes mellitus who presents with increasing confusion  this morning.  He did fine yesterday and ate a little bit of things out of  the normal including steak and this morning began having increasing  confusion.  He came to the emergency department quite obtunded.   Dr. Jena Gauss seems him frequently and just drew 10 L of fluid 2 weeks ago from  his belly.  He has had no fevers or other complaints.  He did come out with  a slight white count and it was pancultured.  Dr. Karilyn Cota is already aware of  the patient and he is going to see him here in the ICU and has discussed  possibly reobtaining peritoneal fluid to ensure that there is no infection.   He is also on the liver transplant list at [facility].   PAST MEDICAL HISTORY:  1.  Hepatic encephalopathy.  2.  Nonalcoholic steatohepatitis.  3.  Diabetes.  4.  Hyperthyroidism.  5.  History of TIP.   CURRENT MEDICATIONS:  1.  Lactulose daily.  2.  Synthroid 100 mg daily.  3.  Propranolol 10 mg p.o. b.i.d.  4.  Spironolactone 50 mg b.i.d.  5.  Protonix 40 mg daily.  6.  Multivitamins.  7.  Amaryl 2 mg daily.   ALLERGIES:  IV DYE.   SOCIAL HISTORY:  Nonsmoker, nondrinker with a very supportive family.   FAMILY HISTORY:  Noncontributory.   Dictation ended at this point.      Corrie Mckusick, M.D.  Electronically Signed     JCG/MEDQ  D:  07/02/2005  T:  07/02/2005  Job:   [id number]

## 2010-11-24 NOTE — Op Note (Signed)
[name], [name]                ACCOUNT NO.:  [id number]   MEDICAL RECORD NO.:  [id number]          PATIENT TYPE:  INP   LOCATION:  IC10                          FACILITY:  [facility]   PHYSICIAN:  Lionel December, M.D.    DATE OF BIRTH:  1941/02/15   DATE OF PROCEDURE:  07/02/2005  DATE OF DISCHARGE:                                 OPERATIVE REPORT   PROCEDURE:  Diagnostic abdominal paracentesis.   INDICATIONS:  [name] is a 70 year old Caucasian male with known cirrhosis  who presents with hepatic encephalopathy. He is undergoing diagnostic tap to  make sure he does not have SBP, or spontaneous bacterial peritonitis.  Informed consent for the procedure was obtained from his wife, Ms. Kyle Sark  [name].   FINDINGS:  Procedure performed at bedside in ICU. The patient was lying in  right lateral recumbent position. He was maintained in this position. The  skin in left mid abdomen was prepped in the usual fashion with Betadine  solution and isolated using sterile sheet. One percent Xylocaine was  injected into the skin and subcutaneous tissue. Deeper injection was made  with 22-gauge spinal needle. As peritoneal cavity was entered, fluid was  aspirated. It was hazy amber-colored fluid. Twenty mL was removed without  difficulty. Puncture site was covered with Band-Aid. The patient tolerated  the procedure well.   FINAL DIAGNOSIS:  Status post diagnostic abdominal paracentesis to rule out  spontaneous bacterial peritonitis.   RECOMMENDATIONS:  Fluid will be sent to the lab for cell count with  differential, Gram stain, aerobic, anaerobic cultures and blood culture  bottles. Will also check protein and glucose.      Lionel December, M.D.  Electronically Signed     NR/MEDQ  D:  07/02/2005  T:  07/03/2005  Job:  [id number]

## 2010-11-24 NOTE — Discharge Summary (Signed)
NAMELIVAN, [name]                ACCOUNT NO.:  [id number]   MEDICAL RECORD NO.:  [id number]          PATIENT TYPE:  INP   LOCATION:  A223                          FACILITY:  [facility]   PHYSICIAN:  Patrica Duel, M.D.    DATE OF BIRTH:  October 24, 1940   DATE OF ADMISSION:  08/17/2004  DATE OF DISCHARGE:  02/11/2006LH                                 DISCHARGE SUMMARY   DISCHARGE DIAGNOSES:  1.  Recurrent hepatic encephalopathy with prompt response to therapy.  2.  Liver failure/cirrhosis secondary to non-alcoholic steato hepatitis.  3.  Diabetes mellitus type 2, controlled.  4.  Hypertension, controlled.  5.  Hypothyroidism, compensated.   For details regarding admission, please refer to the admitting note.  Briefly, this 70 year old male with the above history had been admitted  approximately 4 weeks prior to this admission with recurrent hepatic  encephalopathy. He responded very well to Lactulose and routine measures and  was discharged home in good condition.  The patient was doing very well at home except for mild sleepiness for 2  to 3 days prior to this admission. He went to dinner the night before and  drove without difficulty. He ate meal consisting of beef, squash and  potatoes.  The patient was found the morning of admission with obvious mental status  changes and confusion. An ambulance was called and he was brought to the  emergency room for evaluation.  In the emergency department, the patient was found to have serum ammonia of  254, LFTs otherwise stable except for mild elevation of his alkaline  phosphatase and transaminases, which are chronic. Renal function, hemoglobin  and electrolytes were normal. Glucose 254.  The patient was admitted with probable recurrent hepatic encephalopathy.   HOSPITAL COURSE:  The patient did very well in the hospital. He was  administered Lactulose, Neomycin and by the next morning, was fully alert  and oriented. Dr. Dionicia Abler was consulted. He  is potentially a transplant  candidate, though his BMI is somewhat elevated to meet [facility]'s criteria and  we are in contact with the [place] [facility] [place] for further consideration  of this modality.  Currently, the patient is stable for discharge. He will continue his home  medications, which include Synthroid (increased to 88 mcg q.d.), Propranolol  10 mg b.i.d., Lasix 40 q.d., Spironolactone 25 b.i.d., Aciphex 20 q.d.,  multiple vitamin and Lactulose 30 cc t.i.d. He and his family are well-  versed in an appropriate diet. He will be followed as an outpatient.      MC/MEDQ  D:  09/08/2004  T:  09/08/2004  Job:  [id number]

## 2010-11-24 NOTE — H&P (Signed)
NAMEYUNIOR, JAIN                ACCOUNT NO.:  [id number]   MEDICAL RECORD NO.:  [id number]          PATIENT TYPE:  INP   LOCATION:  IC10                          FACILITY:  [facility]   PHYSICIAN:  Corrie Mckusick, M.D.  DATE OF BIRTH:  [birth year]   DATE OF ADMISSION:  07/02/2005  DATE OF DISCHARGE:  LH                                HISTORY & PHYSICAL   ASSESSMENT:  A 70 year old gentleman with hepatic encephalopathy with  recurrence.   PLAN:  1.  Admit to the ICU for close monitoring.  2.  Ativan for agitation.  3.  Restraints as needed.  4.  Lactulose aggressively every 6 hours.  5.  Consult GI and Dr. Karilyn Cota will see the patient this morning.  6.  We will continue other home medications.      Corrie Mckusick, M.D.  Electronically Signed     JCG/MEDQ  D:  07/02/2005  T:  07/02/2005  Job:  [id number]

## 2010-11-24 NOTE — Op Note (Signed)
   NAME:  SHAFIN, POLLIO                          ACCOUNT NO.:  [id number]   MEDICAL RECORD NO.:  [id number]                   PATIENT TYPE:  AMB   LOCATION:  DAY                                  FACILITY:  [facility]   PHYSICIAN:  R. Roetta Sessions, M.D.              DATE OF BIRTH:  1940/09/04   DATE OF PROCEDURE:  08/12/2002  DATE OF DISCHARGE:                                 OPERATIVE REPORT   PROCEDURE PERFORMED:  Colonoscopy and snare polypectomy.   INDICATIONS FOR PROCEDURE:  The patient is a 70 year old gentleman who comes  for screening colonoscopy.  He has a history of NAFLD/cryptogenic cirrhosis  and is seeing Dr. Barbie Banner down at [facility] in the liver transplant clinic.  Colonoscopy is now being done for screening purposes.  Approach has been  discussed with the patient previously and again at the bedside.  Potential  risks, benefits and alternatives have been reviewed and questions answered.  Please see my handwritten H&P in my office dictation for more information  ASA.   MONITORING:  Oxygen saturations, blood pressure, pulse and respirations were  monitored throughout the entire procedure.   ANESTHESIA:  Conscious sedation Versed 2 mg IV, Demerol 50 mg IV in divided  doses.   INSTRUMENT USED:  Olympus video chipped adult colonoscope.   DESCRIPTION OF PROCEDURE:  Digital rectal exam revealed no abnormalities.  Prep was good.   Rectum:  Examination of rectal mucosa including the retroflex view and anal  verge revealed no mucosal abnormalities although mucosa was somewhat  diffusely friable.   Colon:  Colonic mucosa was surveyed from the rectosigmoid junction through  the left, right, transverse colon to the area of the appendiceal orifices,  ileocecal valve and cecum.  These structures were well seen and  photographed.  For the record, the patient had a 5 mm pedunculated polyp at  30 cm.  The remainder of the colonic mucosa appeared normal.  The terminal  ileum was intubated  to 10 cm.  This segment of GI tract also appeared  normal.  From this level, the scope was slowly and cautiously withdrawn, and  all previous images of mucosal structures were again seen and again no  abnormalities were observed.  No other abnormalities   DICTATION ENDED HERE.                                               Jonathon Bellows, M.D.    RMR/MEDQ  D:  08/12/2002  T:  08/12/2002  Job:  [id number]

## 2010-11-24 NOTE — Op Note (Signed)
   NAME:  [name], [name]                          ACCOUNT NO.:  [id number]   MEDICAL RECORD NO.:  [id number]                   PATIENT TYPE:  AMB   LOCATION:  DAY                                  FACILITY:  [facility]   PHYSICIAN:  R. Roetta Sessions, M.D.              DATE OF BIRTH:  1941-05-12   DATE OF PROCEDURE:  08/12/2002  DATE OF DISCHARGE:                                 OPERATIVE REPORT   The polyp at 30 cm was resected with snare cautery.  It was recovered  through the system.  The patient tolerated the procedure well.   IMPRESSION:  1. Somewhat diffusely friable rectum and colon.  2. Polyp at 30 cm, snared.  3. Remainder of colonic mucosa, rectum, and terminal ileum appeared normal.   RECOMMENDATIONS:  1. No aspirin or arthritis medications for the next 10 days.  2. Follow up on pathology.  3. Further recommendations to follow.                                               Jonathon Bellows, M.D.    RMR/MEDQ  D:  08/12/2002  T:  08/12/2002  Job:  [id number]   cc:   Madelin Rear. Sherwood Gambler, M.D.  [address]  [place]  [place] [address]  Fax: [phone]

## 2010-11-24 NOTE — Consult Note (Signed)
NAMEJUWAN, [name]                ACCOUNT NO.:  [id number]   MEDICAL RECORD NO.:  [id number]          PATIENT TYPE:  INP   LOCATION:  IC03                          FACILITY:  [facility]   PHYSICIAN:  Lionel December, M.D.    DATE OF BIRTH:  1940/09/21   DATE OF CONSULTATION:  DATE OF DISCHARGE:                                   CONSULTATION   REASON FOR CONSULTATION:  Hepatic encephalopathy.   HISTORY OF PRESENT ILLNESS:  [name] is a 70 year old Caucasian male with  known history of cirrhosis secondary to NASH complicated by two episodes of  hepatic encephalopathy last year who was in his usual state of health until  this morning when he woke up to go to the bathroom around 5 o'clock.  He was  unable to get back to his bed.  He got confused.  His wife could not get him  to get out of the bathroom.  I was paged.  Was recommended that paramedics  be called and patient be brought to the emergency room.  The patient was  evaluated in the emergency room by Dr. Lillia Carmel.  On arrival, he was  confused and disoriented and combative.  He was calmed down with  haloperidol.  Lab studies revealed elevated serum ammonia level.  The  patient was begun on IV fluids and hospitalized.  He has been able to  swallow his Lactulose.  He now feels much better which is just about eight  hours after hospitalization and able to provide some history.  He states he  wanted to go to church yesterday and therefore skipped his Lactulose.  He  does not take any narcotics.  He denies abdominal pain, fever, chills,  cough, shortness of breath, or dysuria.  He also denies melena or rectal  bleeding.  His bowels have been moving fairly regularly.   The patient was hospitalized in September, 2005 with hepatic encephalopathy  which was determined to be due to use of hydrocodone for back pain.  He  presented a few weeks later and had been on Darvocet N-100.  He has not had  any problems since then.  He was seen by Dr.  Concha Se of [facility] and is to be included into a study protocol for NASH.  He has  an appointment in two days.   Presently, he is complaining of irritation due to Foley catheter and  requests that it be removed.   1.  Lactulose 30 ml q.i.d.  2. Haloperidol 5 mg IV q.4 p.r.n.  His home      medications are:  Synthroid 75 mcg daily, Inderal 10 mg t.i.d.,      Spironolactone 25 mg b.i.d. or t.i.d., Aciphex 40 mg q.a.m., Lactulose 2      tablespoonfuls t.i.d.   PAST MEDICAL HISTORY:  1.  Cirrhosis secondary to NASH.  He has undergone previous workup by doctor      locally and by Dr. Allena Katz of [facility].  He has      grade 2-3 esophageal varices.  His last  EGD was on March 20, 2004 by      Dr. Jena Gauss. He was also noted to have portal gastropathy and anterior      erosions and a single pyloric channel ulcer.  2. He had circumcision in      March, 2004 for balanitis with phimosis.  3. History of diabetes      mellitus.  4. Hypertension.  5. Obesity.   ALLERGIES:  Iodine dye.   SOCIAL HISTORY:  Please review previous consult notes.  He is married.  He  has one daughter.  He does not drink alcohol or smoke cigarettes.  He is  presently on disability.   FAMILY HISTORY:  Father also had NASH with cirrhosis and died at age 37.  Mother died of lymphoma in her 45s.  Two siblings have hypertension.  One  daughter died of complications due to diabetes mellitus.   PHYSICAL EXAMINATION:  Well-developed, morbidly obese Caucasian male,  appears to be in no acute distress.  He weighs 263.3 pounds.  He is 69  inches tall.  Please note his weight was 274.2 pounds on March 27, 2004.  Pulse 87 per minute, blood pressure 129/68, respirations 14, and temperature  97.6.   He is awake, alert.  He knows that he is at [facility].  He does  have asterixis.  Conjunctivae is pink.  Sclerae is nonicteric.  Oral mucosa  is normal.  No neck masses are  noted.   LUNGS:  Clear to auscultation.  CARDIAC:  Regular rhythm, normal S1, S2.  No murmur or gallop noted.  He  does not have spider angiomata or gynecomastia.  ABDOMEN:  Obese, bowel sounds are normal.  He does not appear to have  shifting dullness.  Liver appears to be enlarged, 4-5 cm below RCM.  Spleen  tip was palpable.  He does not have peripheral edema.  He also does not have  scrotal edema.   LABORATORY DATA:  WBC 5.6, H&H is 12.2 and 35.7, platelet count 84K.  INR is  1.7.  Sodium 133, potassium 4.3, chloride 108, CO2 22, glucose 186, BUN 16,  creatinine 1.1, total bilirubin 2.3, AP 165, AST 66, ALT 46, total protein  7.1, albumin is 2.3, calcium 8.3.  Serum ammonia 96.   Serum ammonia 110 on April 12, 2004.  Alpha fetoprotein was 2.3.  INR on  March 28, 2004 was 1.6.   His total bilirubin on June 13, 2004 was 1.5 and albumin was 2.5.   ASSESSMENT:  [name] is a 70 year old Caucasian male with a history of  cirrhosis secondary to NASH complicated by hepatic encephalopathy and  nonbleeding esophageal varices who presents with a third episode of hepatic  encephalopathy.  First episode occurred in September, 2005 resulting from  narcotic use and a second episode three weeks later, also resulted from  narcotic use.  This time, his hepatic encephalopathy appears to be  precipitated by skipping Lactulose.  He does not have any evidence of GI  bleed, dehydration, electrolyte imbalance.  I doubt that he has SBP.  Clinically, he does not have ascites.  I suspect it is  small in amount.  Since he has dramatically improved with two doses of Lactulose, I do not  feel that we need to proceed with abdominal paracentesis, however, if he is  noted to be febrile, will consider ultrasound-guided diagnostic abdominal  paracentesis.   He has fairly advanced liver disease, although he has been doing reasonably well except for  these episodes of encephalopathy.  I am not sure  whether or  not he would qualify for NIH protocol, but I would like for him to still  follow up with Dr. Allena Katz of [facility]   RECOMMENDATIONS:  We should continue with Lactulose at the present dose of  30 ml q.i.d.  We will advance his diet to full liquids.  Will ask dietitian  to see the patient and family before he goes home so he could be on 60-80 g  of protein per day.   Serum ammonia will be repeated in a.m.   We will inform folks at [facility] that the patient may  not be able to keep his appointment on July 26, 2004.   We would like to thank Dr. Sherwood Gambler to participate in the care of this  gentleman.      NR/MEDQ  D:  07/24/2004  T:  07/24/2004  Job:  [id number]   cc:   Jovita Gamma, MD  DIV OF GASTROENTEROLOGY  [facility]

## 2010-11-24 NOTE — Procedures (Signed)
[name], [name]                ACCOUNT NO.:  [id number]   MEDICAL RECORD NO.:  [id number]          PATIENT TYPE:  OUT   LOCATION:  RESP                          FACILITY:  [facility]   PHYSICIAN:  Edward L. Juanetta Gosling, M.D.DATE OF BIRTH:  Jul 09, 1941   DATE OF PROCEDURE:  06/20/2005  DATE OF DISCHARGE:                              PULMONARY FUNCTION TEST   RESULTS:  1.  Spirometry shows a moderate ventilatory defect with airflow obstruction.  2.  Lung volumes show a moderate reduction of total lung capacity suggestive      of restrictive disease.  3.  Diffusion capacity of carbon monoxide (DLCO) is moderately reduced.  4.  There is no significant bronchodilator improvement.  5.  Compared to a study of October 11, 2004, there is slight improvement in      forced vital capacity in FEV-1.  The total lung capacity is somewhat      better and diffusion has somewhat as well.  I note a difference in      weight of approximately 20 pounds and some of the improvement could be      related to loss of weight.      Edward L. Juanetta Gosling, M.D.  Electronically Signed     ELH/MEDQ  D:  06/21/2005  T:  06/22/2005  Job:  [id number]

## 2010-11-24 NOTE — Consult Note (Signed)
[name], [name]                ACCOUNT NO.:  [id number]   MEDICAL RECORD NO.:  [id number]          PATIENT TYPE:  INP   LOCATION:  A306                          FACILITY:  [facility]   PHYSICIAN:  Vida Roller, M.D.   DATE OF BIRTH:  May 30, 1941   DATE OF CONSULTATION:  10/11/2004  DATE OF DISCHARGE:                                   CONSULTATION   REFERRED BY:  Kirk Ruths, M.D. and R. Roetta Sessions, M.D.   REASON FOR CONSULTATION:  Mr. [name] is a 70 year old man with end-stage  liver disease, who is pending a transplantation, who presented to [facility] on October 09, 2004, with hyper-ammoniemia with hepatic encephalopathy  and was found to be unresponsive at home.   HISTORY OF PRESENT ILLNESS:  He was admitted and treated aggressively  including a paracentesis, and he has recovered most of his mental function  now and is actually doing reasonably well.  We were asked to see him due to  his fast track referral for liver transplant.  He underwent an  echocardiogram with a bubble study as part of his protocol and now will  require a perfusion study to assess for coronary artery disease.  He is  asymptomatic and has no significant past cardiac history.   PAST MEDICAL HISTORY:  1.  Significant for end-stage liver disease.  He has non-alcoholic      steatohepatitis.  2.  He has hypothyroidism.  3.  He does have diabetes mellitus.  4.  A history of cirrhosis.  5.  Hypertension.  He has never been evaluated for cardiac problems.  6.  Allergy to INTRAVENOUS CONTRAST, secondary to an evaluation a number of      years ago, in which he had a significant rash from his recollection.  I      am not sure which type of IV dye contrast evaluation he had.   MEDICATIONS PRIOR TO ADMISSION:  1.  Synthroid 75 mcg, once daily.  2.  Inderal 2 mg twice daily.  3.  Lasix 40 mg p.r.n. for swelling.  4.  Aldactone 25 mg b.i.d.  5.  Aciphex 20 mg daily.  6.  He was taking vitamin E, a  multivitamin at unknown doses.  7.  He was taking Amaryl at an unknown dose.  8.  He is on sliding scale insulin in the hospital.  9.  He also on Lactulose 10 mL q.6h.  10. Neomycin 500 mg twice daily.   SOCIAL HISTORY:  He lives in [place] with his wife.  He is disabled,  secondary to his liver problems.  He has one daughter who is alive and one  daughter who has passed away.  He does not smoke, drink, or use illicit  drugs.   FAMILY HISTORY:  His mother died at age 31, of lymphoma.  His father died at  age 59, of liver problems.  He had esophageal varices and died of a GI  bleed.  He has one brother and one sister who are alive and have  hypertension, but no liver disease.  REVIEW OF SYSTEMS:  He denies a fever or chills.  Denies headache or  vertigo.  No visual changes or hearing loss.  No skin lesions.  He denies  any chest pain or shortness of breath.  He does have dyspnea on exertion,  but he feels this is secondary to the increase in his abdominal girth.  He  has orthopnea and PND for the same reason.  He does have edema which is  quite substantial in his abdomen, and not really particularly distressing to  him in his legs.  He has never had syncope or pre-syncope.  He does have a  mild cough, but no wheezing.  He complains of dysuria and frequency of  urination, but no hematuria or nocturia.  He denies any weakness or numbness  or neuropsychiatric problems.  He does have arthralgias in his knees, but is  able to walk.  He is only limited by his shortness of breath.  He does not  have any nausea, vomiting, diarrhea, or melena or hematochezia.  He does  have abdominal pain when his belly is full.  No heat or cold intolerance.  No polyuria or polydipsia.  The remainder of his review of systems is  negative.   PHYSICAL EXAMINATION:  VITAL SIGNS:  He weighs 275 pounds with quite  significant abdominal girth.  He is afebrile at 97.5 degrees, pulse 95,  respirations 16, blood  pressure 134/95, saturation 100% on 3 liters nasal  cannula.  GENERAL:  He is alert and oriented x4.  He is quite a pleasant man.  HEENT:  Unremarkable except for some mildly poor dentition.  NECK:  He does not have carotid bruits.  He does have jugular venous  distention which is about to the angle of his jaw when he sits up.  HEART:  Regular, although kind of distant.  I do not hear a murmur.  There  are no extra sounds.  His point of maximal impulse is not palpable.  LUNGS:  Clear to auscultation bilaterally.  SKIN:  He does not have any significant lesions.  ABDOMEN:  Quite significantly protuberant.  He does have a fluid wave.  There is no tenderness, and his bowel sounds are normal.  I did not attempt  to assess his liver or spleen size.  GENITOURINARY:  Exam was deferred.  RECTAL:  Exam was deferred.  EXTREMITIES:  He does have 1+ edema bilaterally to the knees.  Pulses are 1+  throughout.  NEUROLOGIC:  Generally nonfocal.   He has had an ultrasound-guided paracentesis in which he had about 4.5  liters of fluid taken out.  He still has significant ascites on that  ultrasound.  He has a chronic cirrhotic liver with nonspecific gallbladder  thickening.   His chest x-ray shows bibasilar atelectasis with some diffuse peribronchial  thickening.  A head CT showed no intracranial abnormalities.   His electrocardiogram showed a sinus rhythm at a rate of 68 with a normal  interval and normal axes.  He has nonspecific ST-T wave changes, but no left  ventricular hypertrophy generally, just low voltage.   A urine drug screen was negative.  His alcohol is less than 5.  His white  blood cell count is 5.4, hemoglobin 12,  hematocrit 33, platelet count 95.  Sodium 134, potassium 4.3, chloride 109, bicarbonate 23, BUN 18, creatinine  1.1, blood sugar 181.  His liver function tests:  AST 56, ALT of 34, total  bilirubin 2.1, alkaline phosphatase 142, alpha fetoprotein  1.9, total protein 7.6,  albumin 2.0.  His ammonia when he was admitted was 221.  Yesterday it was 16.  His beta natriuretic peptide is 32.   His echocardiogram shows him to have an ejection fraction of about 50%-55%  with mild global hypokinesis, and his bubble study was negative.  It was a  focused exam, specifically for the bubble study.   ABG:  The pH 7.54, pCO2 of 23, pO2 of 92, 98% on 3 liters nasal cannula.  I  am not sure when that was done.  A urinalysis shows a pH of 8.0, specific  gravity of 1.015, without any significant findings.  His INR is 1.6.   DISCUSSION/RECOMMENDATIONS:  This is a gentleman with no cardiac history but  cardiac risk factors, who is pending an evaluation for a liver transplant,  and we are going to help facilitate that by getting a myocardial perfusion  imaging study done.  I talked with the pharmacist regarding how to do this  best, as I was a bit concerned about giving him adenosine as a stress agent.  I really do not think he can walk on a treadmill with his significant  abdominal girth and his general medical condition being so significantly  down.  I was reassured that an adenosine was safe to use, that there were no  alterations in renal dosing for it.   We are going to check with the manufacturer prior to doing this, but I  suspect that we can probably do an adenosine Myoview on him, and that will  likely be all we will need to do.  Hopefully he does not have significant  coronary artery disease.  If he does have significant coronary disease, we  will need to help facilitate further evaluation, and I am not sure if that  should be done at the [facility] [place], where he is going to get his  liver transplant, or whether we should do it here.  We will discuss that  with his primary physician.      JH/MEDQ  D:  10/11/2004  T:  10/11/2004  Job:  [id number]

## 2010-11-24 NOTE — Consult Note (Signed)
[name], [name]                ACCOUNT NO.:  [id number]   MEDICAL RECORD NO.:  [id number]          PATIENT TYPE:  INP   LOCATION:  IC10                          FACILITY:  [facility]   PHYSICIAN:  Lionel December, M.D.    DATE OF BIRTH:  1941-05-14   DATE OF CONSULTATION:  07/02/2005  DATE OF DISCHARGE:                                   CONSULTATION   GASTROENTEROLOGY CONSULTATION:   DATE OF CONSULTATION:  July 02, 2005.   REFERRING PHYSICIAN:  Assunta Found, MD   REASON FOR CONSULTATION:  Hepatic encephalopathy.   HISTORY OF PRESENT ILLNESS:  [name] is 70 year old Caucasian male with known  cirrhosis secondary to NASH with history of encephalopathy who has done well  since he was placed on Xifaxan.  He was admitted last in June 2006 for  hepatic encephalopathy.  He has had a few minor spells at home according to  his wife but he has bounced back very quickly obviating the need for  emergency room visit at hospital.  The patient had large volume abdominal  paracentesis by Dr. Jena Gauss on June 18, 2005.  He had 10.2 L of fluid  removed.  It had 450 white cells with segs only 20%.  Cultures were  negative.  At that time serum ammonia was 11.  He also did lab studies; his  BUN was 60 and creatinine was 3.  Patient has been taking his medicines as  prescribed.  Because of the holidays he has been busy and felt tired  yesterday when he went to bed.  He woke up around 5 o'clock this morning and  went to the bathroom and he just stayed there for a long time and when his  wife checked on him he was confused.  He then became unresponsive.  He was  brought to emergency room where he was evaluated.  He was noted to have an  ammonia of 125.  He was felt to be encephalopathic and admitted to ICU.  Mrs. Christiana states that he has been adhering to a low protein diet.  He had  no more than 2 ounces of [place] [employer] with his evening meal yesterday.  There is no history of fever, nausea, vomiting,  hematemesis, melena, or  rectal bleeding.  He also has not been complaining of abdominal pain but his  abdomen has been gradually distending more and more since his last tap.  She  states he has been gaining a pound a week.  Yesterday his weight was over  259 pounds.  He also has not had a fever or cough.  However, he has  developed exertional dyspnea.  There was improvement both taps but gradually  he has been getting worse.  Patient has been evaluated for OLT at [facility] and he has appointment to see Dr. Suzi Roots in about a month.  His status is unknown but it does not appear to me that he is on the top of  the list for OLT.   MEDICATIONS:  At home he is on lactulose 30 mL q.i.d., Synthroid 100 mcg  daily, Inderal 10 mg twice daily, Urso 600 mg twice daily, vitamin E 400  units daily, rifaximin 200 mg t.i.d., spironolactone 50 mg twice daily,  Lasix 20 mg every morning, __________  2 mg every morning, Prilosec 20 mg  daily, __________  mg daily, fish oil daily, MVI with mineral daily.   Presently he is on Rocephin 1 g IV q.24 h., Protonix 40 mg IV daily and he  is on the rest of his medicines except Prilosec although he is not able to  swallow any medicines.   PAST MEDICAL HISTORY:  He has advanced cirrhosis felt to be secondary to  NASH.  He had EGD by Dr. Jena Gauss in September 2005 revealing grade 2-3  esophageal varices and portal gastropathy.  At that time he also had a small  pyloric channel ulcer.  He has had multiple episodes of encephalopathy  requiring admission.  As noted above however he has done well since he has  been on rifaximin and this is first admission since June 2006.  He has  hypertension, diabetes mellitus, hypothyroidism and obesity as well as  history of gout and kidney stones.  History of colonic polyps (last exam was  in February 2004).  He also has ascites for which he underwent  __________  recently as above.  Dose of spironolactone has been  decreased because of  rising creatinine and fear of hyperkalemia.   ALLERGIES:  IODINE DYE.   FAMILY HISTORY:  Father died of cirrhosis at age 71, possibly of same.  Mother died of lymphoma in her 17s.  Two siblings have hypertension.   SOCIAL HISTORY:  He is married.  He worked in [employer] but has been  on disability.  He has never smoked cigarettes or drank alcohol.  He has one  daughter, another daughter died in her 30s of complications of juvenile  diabetes mellitus.   PHYSICAL EXAMINATION:  GENERAL:  [name] is well-developed, obese, Caucasian  male who is very drowsy and does not respond appropriately to vocal stimuli.  He just tries to move his arms around.  VITAL SIGNS:  He weighs 260.8 pounds, he is 69 inches tall.  Pulse 78 per  minute, blood pressure 136/86, respiratory rate 16 and oral temperature is  97.8.  HEENT:  Conjunctiva is pink.  Sclera is icteric.  No neck masses or  thyromegaly noted.  Oropharyngeal examination is not possible.  No neck  masses noted.  CHEST:  Cardiac exam with regular rhythm, normal S1 and S2.  No murmur or  gallop noted.  Lungs are clear to auscultation.  ABDOMEN:  Abdomen is protuberant but soft with shifting dullness.  He has  multiple scratch marks over his lower abdomen as well as flanks.  RECTAL:  Rectal examination deferred.  EXTREMITIES:  He does not have peripheral edema.   LABORATORIES:  WBC is 15, H&H are 12.5 and 35.8, platelet count is 85,000.  Sodium 127, potassium 5.1, chloride 101, CO2 was 22, glucose 166, BUN 52,  creatinine 3.4, calcium is 8.4, serum ammonia 125.  Urinalysis is negative  for nitrites and leukocytes.   ASSESSMENT:  [name] is 70 year old Caucasian male with known advanced  cirrhosis presents with hepatic encephalopathy.  He has been maintained on  low protein diet as well as lactulose and rifaximin.  He had multiple episodes of hepatic encephalopathy last year and early part of this year but  has  done well since he has been on rifaximin.  Like his previous episode  there is no obvious precipitating event although spontaneous bacterial  peritonitis has not been ruled out.  I agree with Dr. Phillips Odor that he needs  to be tapped for diagnostic purposes.  His last tap was 2 weeks ago and he  had 450 white cells and 20% were segs.  Cultures however are negative.  Clinically he does not appear to be dehydrated.  His creatinine however is  trending up which is very worrisome.   RECOMMENDATIONS:  1.  I agree with therapy with Rocephin IV until SBP has been ruled out.  2.  Diagnostic abdominal paracentesis to be performed as soon as possible.  3.  Nursing staff advised not to give him Phenergan, Xanax, Ambien, and      Darvocet.  If he needs anxiolytic or something to calm down I would like      for the staff to call Dr. Phillips Odor or myself.  4.  Lactulose enemas until patient is alert and able to swallow his meds.  5.  We will check his LFTs, ammonia, MET-7 in the morning.  We will also      notify Dr. Suzi Roots about his      condition.  We will also do a renal ultrasound to make sure he does not      have obstructive uropathy.  6.  We may have to stop his spironolactone if serum potassium goes up.   We would like to thank Dr. Phillips Odor for the opportunity to participate in the  care of this gentleman.      Lionel December, M.D.  Electronically Signed     NR/MEDQ  D:  07/02/2005  T:  07/02/2005  Job:  [id number]

## 2010-11-24 NOTE — Discharge Summary (Signed)
NAMESHENOUDA, GENOVA                ACCOUNT NO.:  [id number]   MEDICAL RECORD NO.:  [id number]          PATIENT TYPE:  INP   LOCATION:  A213                          FACILITY:  [facility]   PHYSICIAN:  Patrica Duel, M.D.    DATE OF BIRTH:  1941/05/14   DATE OF ADMISSION:  11/11/2004  DATE OF DISCHARGE:  05/11/2006LH                                 DISCHARGE SUMMARY   DISCHARGE DIAGNOSES:  1.  Recurrent hepatic encephalopathy.  2. Hypertension.  3. Diet-controlled      diabetes mellitus.  4. Hypothyroidism, compensated.   For details regarding admission, please refer to the admitting.  Briefly,  this 70 year old male with known end-stage cirrhosis secondary to non-  alcoholic steatohepatitis and other history as noted above was discharged  approximately five days prior to this admission with recurrent hepatic  encephalopathy.  He was discharged in stable condition on 5/2.  The patient  was brought back to the emergency room with recurrent obtundation.  This was  of very rapid onset.  Serum ammonia in the emergency department was 298 and  INR 1.8, mild elevation of LFTs and reasonable chemistries and hemogram.  He  was admitted with recurrent hepatic encephalopathy.   HOSPITAL COURSE:  The patient did well in the hospital with lactulose and  neomycin-RX.  GI was consulted.  He was treated routinely.  His mental  status improved to baseline.  He was stable for discharge on the fifth  hospital day.   DISCHARGE MEDICATIONS:  1.  Synthroid 88 mcg daily.  2. Spironolactone 100 b.i.d.  3. Lactulose 15      mg b.i.d.  4. Lasix 20 q.a.m.  5. Propranolol 20 b.i.d.  5. Urso 500 mg      2 b.i.d.  6. Amaryl 2 mg daily.  7. Omeprazole 20 daily.  8. Zinc.  9.      Fish oil.  10. Vitamin E.  11. Multivitamins.   PLAN:  He will be followed as an outpatient.       MC/MEDQ  D:  12/11/2004  T:  12/11/2004  Job:  [id number]

## 2010-11-24 NOTE — H&P (Signed)
[name], [name]                ACCOUNT NO.:  [id number]   MEDICAL RECORD NO.:  [id number]          PATIENT TYPE:  EMS   LOCATION:  ED                            FACILITY:  [facility]   PHYSICIAN:  Patrica Duel, M.D.    DATE OF BIRTH:  Jul 24, 1940   DATE OF ADMISSION:  08/17/2004  DATE OF DISCHARGE:  LH                                HISTORY & PHYSICAL   CHIEF COMPLAINT:  Confusion, hypersomnolence.   HISTORY OF PRESENT ILLNESS:  This is a 70 year old male who has been  admitted several times in the past year with recurrent hepatic  encephalopathy.  He has an established diagnosis of nonalcoholic  steatohepatitis.  He has a history of diabetes mellitus type 2,  hypertension, as well as hypothyroidism, all well-controlled and  compensated.   The patient was last admitted approximately four weeks ago and was in the  hospital for several days with recurrent hepatic encephalopathy.   The patient responded very well to lactulose and routine measures and was  discharged home in good condition.   The patient is doing quite well at home.  He did complain of some sleepiness  two to three days ago.  He went to dinner last night and drove without  difficulty.  He ate a meal consisting of beef, squash and potatoes.   The patient was found this morning at home in the bathroom with obvious  mental status changes.  An ambulance was summoned and he was brought to the  emergency room for evaluation.   In the emergency department, the patient's workup was significant for a  serum ammonia of 254 (high normal 35).  LFTs otherwise stable with mild  elevation of alkaline phosphatase and transaminases (chronic).  Renal  function and hemogram, electrolytes normal, glucose 254.   The patient is admitted with probable recurrent hepatic encephalopathy.   There is no history of nausea, vomiting, diarrhea, melena, hematemesis,  hematochezia or abdominal pain, headache, chest pain, shortness of breath,  or  genitourinary symptoms.   CURRENT MEDICATIONS:  1.  Synthroid 75 mcg daily.  2.  Propranolol 10 mg b.i.d.  3.  Lasix 40 mg daily.  4.  Spironolactone 25 mg b.i.d.  5.  Aciphex 20 mg daily.  6.  Multiple vitamins.  7.  P.R.N. propoxyphene, lactulose and fish oil.  Lactulose dose 30 mL      t.i.d.   ALLERGIES:  IVP DYE.   PAST HISTORY:  As noted above.   FAMILY HISTORY:  Noncontributory.   REVIEW OF SYSTEMS:  Negative except as mentioned.   PHYSICAL EXAMINATION:  GENERAL:  This is a large, moderately obese male who  currently is unresponsive but gagging in response to an NG tube which had  previously been placed.  VITAL SIGNS AT PRESENTATION:  Temperature is 97.6, BP 127/100, pulse 76,  respirations 22.  HEENT:  Normocephalic, atraumatic.  Pupils are equal and reactive.  Ears,  nose and throat are benign.  There is no scleral icterus.  NECK:  Supple without bruits.  CHEST:  Lungs are clear.  CARDIAC:  Heart sounds  are normal without murmurs, rubs or gallops.  ABDOMEN:  Nontender, nondistended, liver is not palpable by my examination.  EXTREMITIES:  No clubbing, cyanosis, or edema.  NEUROLOGIC:  Nonfocal.  He is moving all four extremities.  There is no  facial droop, etc.   ASSESSMENT:  Recurrent hepatic encephalopathy in a 70 year old male with  history as delineated above.   PLAN:  Will admit to ICU for close observation, lactulose administration and  close monitoring.  Will follow and treat expectantly.  Of note, the patient  will most likely require restraints as he has proved to be difficult to  manage when he is in this state.      MC/MEDQ  D:  08/17/2004  T:  08/17/2004  Job:  [id number]

## 2010-11-24 NOTE — H&P (Signed)
NAME:  [name], [name]                ACCOUNT NO.:  [id number]   MEDICAL RECORD NO.:  [id number]          PATIENT TYPE:  INP   LOCATION:  IC10                          FACILITY:  [facility]   PHYSICIAN:  Corrie Mckusick, M.D.  DATE OF BIRTH:  1940-07-29   DATE OF ADMISSION:  07/02/2005  DATE OF DISCHARGE:  LH                                HISTORY & PHYSICAL   CONTINUATION   PHYSICAL EXAMINATION:  VITAL SIGNS:  Temperature 98.7, blood pressure  120/84, pulse 68, respirations 18, O2 saturations 96% on room air.  GENERAL:  Confused, nearly obtunded gentleman who is sleeping.  HEENT:  Nasopharynx and oropharynx are clear with moist mucous membranes.  NECK:  Supple.  Thick.  No JVD.  CHEST:  Clear to auscultation bilaterally.  CARDIAC:  Regular rate and rhythm with S1, S2, no murmurs, rubs or gallops.  ABDOMEN:  Distended, tympanitic.  Bowel sounds positive.  EXTREMITIES:  No edema.  NEUROLOGIC:  Responsive to pain and other stimuli, but really just overall  obtunded.   LABORATORY DATA AND X-RAY FINDINGS:  White count 15.0, hematocrit 35.8,  platelets 85.  Sodium 127, potassium 5.1, chloride 101, bicarb 22, glucose  166, BUN 52, creatinine 3.4.  Ammonia level 125.   ASSESSMENT:  Dictation ended at this point.      Corrie Mckusick, M.D.  Electronically Signed     JCG/MEDQ  D:  07/02/2005  T:  07/02/2005  Job:  [id number]

## 2010-11-24 NOTE — Op Note (Signed)
NAMESTEVON, [name]                ACCOUNT NO.:  [id number]   MEDICAL RECORD NO.:  [id number]          PATIENT TYPE:  INP   LOCATION:  IC07                          FACILITY:  [facility]   PHYSICIAN:  R. Roetta Sessions, M.D. DATE OF BIRTH:  05-Feb-1941   DATE OF PROCEDURE:  11/04/2004  DATE OF DISCHARGE:                                 OPERATIVE REPORT   PROCEDURE:  Attempt at diagnostic paracentesis.   SURGEON:  R. Roetta Sessions, M.D.   INDICATIONS FOR PROCEDURE:  The patient is a 70 year old gentleman admitted  to the hospital with end-stage liver disease secondary to NASH with  recurrent encephalopathy.  Plan is being made to transfer him to the  [facility] [place] for further evaluation for liver transplant.  I spoke  to Dr. Wende Crease on the telephone x2 earlier today.  They are going to accept  the patient as soon as a bed is available.  They also recommended that he  have another paracentesis even though he has been on rifaximin and had a  negative tap a few weeks ago.   It is not felt that Mr. Reihl has tense ascites at this time as he  apparently has diuresed fairly well recently.  We will, however, go ahead  and plan to perform a diagnostic paracentesis now.  The potential risks,  benefits and alternatives have been reviewed with the patient's wife.  All  parties are agreeable.   DESCRIPTION OF PROCEDURE:  A suitable site overlying the right lower  quadrant was selected for paracentesis.  Overlying skin was prepped in  sterile fashion with Betadine.  Three mL of 1% Xylocaine was used for local  infiltrative anesthesia.  Using the quick tap needle, the abdominal wall was  traversed.  I believe the peritoneal cavity was accessed; however, I was  unable to recover any fluid whatsoever.  The patient tolerated the procedure  well.  The paracentesis site was dressed.   Failed attempt at diagnostic paracentesis, right lower quadrant, as  described above.  Will continue present  management and await word on  transfer to the [place] [place].     RMR/MEDQ  D:  11/04/2004  T:  11/04/2004  Job:  [id number]   cc:   Patrica Duel, M.D.  [address]  [place]  [place] [address]  Fax: [phone]

## 2010-11-24 NOTE — Cardiovascular Report (Signed)
NAMERANDE, ROYLANCE                ACCOUNT NO.:  [id number]   MEDICAL RECORD NO.:  [id number]          PATIENT TYPE:  INP   LOCATION:  6526                         FACILITY:  [facility]   PHYSICIAN:  Jonelle Sidle, M.D. LHCDATE OF BIRTH:  [birth year]   DATE OF PROCEDURE:  09/12/2005  DATE OF DISCHARGE:                              CARDIAC CATHETERIZATION   CARDIOLOGIST:  Dr. Dorethea Clan.   PRIMARY CARE PHYSICIAN:  Dr. Regino Schultze.   INDICATIONS:  Mr. [name] is a 70 year old male admitted in transfer from  [facility] yesterday by Dr. Jens Som with recent onset chest pain  at rest and abnormal cardiac markers concerning for an acute coronary  syndrome/non-ST elevation myocardial infarction. His history is complicated  by hypertension, type 2 diabetes mellitus, hypothyroidism, renal  insufficiency and cirrhosis status post liver transplantation in December  2006 at the [facility]. In addition, he has an  intravenous contrast dye allergy. Cardiac catheterization has been  recommended to clearly define the coronary anatomy and assess for  revascularizations strategies. The patient has been seen by the nephrology  service and has been hydrated with the addition of bicarbonate. His  creatinine today has come down to 1.7 from 1.9 on admission. He has also  been pretreated for contrast dye allergy prophylaxis. He is noted to be  anemic which is apparently not a new problem based on his records with a  hemoglobin of 9.4. He has guaiac-positive stools, although no gross melena  or hematochezia. The risks and benefits of the procedure have been explained  to the patient in advance and informed consent was obtained prior to  proceeding.   PROCEDURES PERFORMED:  Selective coronary angiography.   ACCESS AND EQUIPMENT:  The right femoral artery was anesthetized 1%  lidocaine and a 6-French sheath was placed in the right femoral artery via  the modified Seldinger technique.  Standard preformed 6-French JL-4 and JR-4  catheters were used for selective coronary geography. All exchanges were  made over wire and the patient tolerated the procedure well without  immediate complications. Total of 90 cc Omnipaque were used.   HEMODYNAMIC RESULTS:  Aorta 138/80 mmHg.   ANGIOGRAPHIC FINDINGS:  1.  Left main coronary artery is calcified essentially throughout its course      and gives rise to the left anterior descending and circumflex coronary      arteries. There is approximately 20% stenosis in the midvessel segment.  2.  The left anterior descending is a medium-caliber vessel with two      proximal diagonal branches and a large septal perforator. The first      diagonal branch has approximately 50-60% stenosis in its proximal to mid-      portion. The second diagonal has a focal 90% stenosis proximally which      seems most consistent with the culprit lesion. Otherwise left anterior      descending has minor luminal irregularities including a 30% stenosis      distal to the second diagonal branch.  3.  The circumflex coronary system is medium in caliber. There is a  large      bifurcating obtuse marginal. There are minor luminal irregularities      noted including a 20% ostial stenosis.  4.  The right coronary artery is a medium to large caliber vessel. There is      a large posterior descending branch. There are 20% proximal and mid-      vessel stenoses as well as what appears to be approximately 50% stenosis      in the distal portion of the posterolateral system.   Left ventriculography was not performed in an effort to conserve contrast  use.   DIAGNOSES:  1.  Coronary artery disease as outlined: The major epicardial vessels      essentially exhibit mild atherosclerosis. However, the second diagonal      branch, which is a 2 mm vessel, has a focal 90% stenosis which appears      to be the culprit lesion.  2.  No left ventriculography was performed. Aortic  pressure was 138/80.   DISCUSSION:  I reviewed the results with the patient and went over the films  with Dr. Samule Ohm. Plan at this point will be to complete the bicarbonate  protocol with additional hydration, follow up on 2-D echocardiography to  define left ventricular function, and follow-up on renal function in the  morning with continued assistance from nephrology. If renal function remains  stable, consideration can then be given to percutaneous intervention (likely  with cutting balloon angioplasty without the use of stent placement) to  address the diagonal stenosis and a limited course of Plavix.           ______________________________  Jonelle Sidle, M.D. [facility]     SGM/MEDQ  D:  09/12/2005  T:  09/13/2005  Job:  [id number]   cc:   Vida Roller, M.D.  Fax: [phone]   Kirk Ruths, M.D.  Fax: [phone]

## 2010-11-24 NOTE — Discharge Summary (Signed)
NAMEBJORN, HALLAS                ACCOUNT NO.:  [id number]   MEDICAL RECORD NO.:  [id number]          PATIENT TYPE:  INP   LOCATION:  IC10                          FACILITY:  [facility]   PHYSICIAN:  Corrie Mckusick, M.D.  DATE OF BIRTH:  [birth year]   DATE OF ADMISSION:  07/02/2005  DATE OF DISCHARGE:  12/27/2006LH                                 DISCHARGE SUMMARY   HISTORY OF PRESENT ILLNESS:  For history of present illness and past medical  history, please see admission H&P.   HOSPITAL COURSE:  A 70 year old gentleman with hepatic encephalopathy with  recurrence.  He was admitted to the ICU for close monitoring.  Ativan was  given for agitation as needed.  Lactulose was given.  GI was consulted.  Please see their note for details.  He was kept n.p.o. and IV fluids were  given.   The day after admission, he was much more alert.  He was able to know who I  was.  Vital signs were stable.  He was changed from D-5.  Dr. Karilyn Cota  contacted [facility] as he is awaiting liver transplant there.   On December 27, the day of discharge, he was much more alert and oriented.  He was ready for discharge.  Please see Dr. Edison Simon note on the day of  discharge for physical exam and details.   DISPOSITION:  He was transferred on July 04, 2005, to [place] [facility]  [place].   DISCHARGE MEDICATIONS:  Discharged home on same exact medications he came in  on.   ADDENDUM  Subsequently, he has obtained a liver transplant at [facility] and is doing quite  well.      Corrie Mckusick, M.D.  Electronically Signed     JCG/MEDQ  D:  08/06/2005  T:  08/06/2005  Job:  [id number]

## 2010-11-24 NOTE — Discharge Summary (Signed)
[name], [name]                ACCOUNT NO.:  [id number]   MEDICAL RECORD NO.:  [id number]          PATIENT TYPE:  INP   LOCATION:  A201                          FACILITY:  [facility]   PHYSICIAN:  Kirk Ruths, M.D.DATE OF BIRTH:  16-Dec-1940   DATE OF ADMISSION:  03/26/2004  DATE OF DISCHARGE:  09/26/2005LH                                 DISCHARGE SUMMARY   DISCHARGE DIAGNOSES:  1.  Hepatic encephalopathy.  2.  Elevated ammonia level.  3.  D M 11 .  4.  Nonalcoholic steatorrhea hepatitis.   HOSPITAL COURSE:  This 70 year old gentleman with longstanding idiopathic  cirrhosis was admitted from home with extreme confusion.  The patient in the  emergency room was found to have an ammonia level of 56 with upper limits of  normal being 35.  He had recently been given some oxycodone for back pain  which was thought to have precipitated this episode.  The patient was  somewhat combative, resistant to therapy and finally he was given lactulose  enema with improvement of his ammonia, but he continued to have confusion  off and on for several days.  He continued to have to get intermittent  Ativan for agitation.  It was thought this was perpetuating his  encephalopathy.  The patient's CBC and hemoglobin were normal through his  stay as well as electrolytes.  He had elevated LFTs on admission with an AST  of 67, ALT 71, ALP 130, total bilirubin 2.8, and direct being 2.3.  At the  time of discharge, this improved to 61, 48, 115, and 1.6.  The patient's  electrolytes also remained stable.  Blood sugars were fairly well controlled  through his stay.  The patient eventually returned to his normal mental  status, CT scan of the brain being negative.  Ultrasound of his abdomen  showing a small liver consistent with cirrhosis.  The patient was stable at  the time of discharged home.   DISCHARGE MEDICATIONS:  He was discharged home on lactulose two tablespoons  three times a day, as well as any  previous medications he was on.  For  example, Lasix, spironolactone, Synthroid, Inderal.   FOLLOW UP:  He will be followed in the office as needed.     Will   WMM/MEDQ  D:  05/15/2004  T:  05/15/2004  Job:  [id number]

## 2010-11-24 NOTE — Discharge Summary (Signed)
[name], [name]                ACCOUNT NO.:  [id number]   MEDICAL RECORD NO.:  [id number]          PATIENT TYPE:  INP   LOCATION:  A340                          FACILITY:  [facility]   PHYSICIAN:  Kirk Ruths, M.D.DATE OF BIRTH:  [birth year]   DATE OF ADMISSION:  04/10/2004  DATE OF DISCHARGE:  10/05/2005LH                                 DISCHARGE SUMMARY   DISCHARGE DIAGNOSES:  1.  Hepatic encephalopathy secondary to elevated ammonia.  2.  Nonalcoholic steatohepatitis (NASH).  3.  Urinary tract infection.  4.  History of hypertension.  5.  History of type 2 diabetes.   HOSPITAL COURSE:  This is 70 year old male was admitted for progressive  confusion, combative. The patient had been seen in the office 3 days before  admission in followup from a previous admission for  elevated ammonia. At  that time, he complained of severe insomnia and was treated with Ambien.  Ammonia level at the time of admission was noted to 78, although those  results did not return until October 5 due to the weekend. Progressive over  the weekend, the patient diminished his use of his lactulose due to  complaint of diarrhea and got progressively confused. Ammonia level on this  admission was 137. The patient was admitted to the floor, begun on IV fluids  as well as lactulose per NG tube. The patient was combative, unable to get  NG tube in. He was treated with lactulose enema. By the following day, the  patient was back to his normal state, alert and oriented, cooperative. Was  noted to have an ammonia level of 26. The patient was seen in consultation  by GI with Dr. Karilyn Cota, and Dr. Karilyn Cota was in contact with Dr. Allena Katz,  hepatologist at [facility], where Mr. Quintin has been followed. The consult was  concerning for possible liver transplant. Dr. Allena Katz stated that patient was  not a candidate until his BMI was less than 35 with BMI at this time  calculated to be 37. Dr. Allena Katz did request a CT to rule out focal  lesions of  the liver. The CT scan will be performed today before patient is discharged  home. The patient during this stay had a dietary consult regarding 60-g  protein diet. He was also given Pneumovax vaccine as well as influenza shot.  The patient's ammonia level today at the time of discharge is 40, and he is  alert and oriented, although somewhat weak. The patient's initial white  count was 6,400 with a hemoglobin of 13.7. This remained stable through his  stay. PT and PTT were basically within normal limits although his INR was  1.6, and he is not on anticoagulants. Electrolytes showed sodium 147,  potassium normal at 4.7, glucose slightly elevated during this stay but very  well controlled. The patient had Foley which when removed showed some blood.  Followup urinalysis showed nitrate positive, many bacteria, too numerous to  count white cells and red cells. He will be discharged home on Cipro for a  week for his urinary tract infection. He will also be continued  on his  lactulose 2 tablespoons 3 times a day and told to avoid using it at bedtime  to avoid night time  diarrhea, Aldactone 25 b.i.d., Synthroid 75 mcg daily, Protonix 40 mg daily,  Inderal 10 mg t.i.d. He will be followed by either GI or [facility] in one week  at which time we will evaluate his ammonia level at that time. The patient  was stable at the time of discharge.      WMM/MEDQ  D:  04/12/2004  T:  04/12/2004  Job:  [id number]

## 2010-12-06 ENCOUNTER — Encounter: Payer: Self-pay | Admitting: Internal Medicine

## 2011-02-09 ENCOUNTER — Encounter: Payer: Self-pay | Admitting: Internal Medicine

## 2011-02-15 ENCOUNTER — Encounter: Payer: Self-pay | Admitting: Internal Medicine

## 2011-02-20 LAB — COMPREHENSIVE METABOLIC PANEL
Albumin: 3.4
BUN: 35 mg/dL — AB (ref 4–21)
Calcium: 9.1 mg/dL
Chloride: 103 mmol/L
Creat: 1.38
Glucose: 167 mg/dL
HCT: 34 %
Hemoglobin: 12.1 g/dL — AB (ref 13.5–17.5)
Potassium: 5.1 mmol/L

## 2011-03-05 ENCOUNTER — Telehealth: Payer: Self-pay

## 2011-03-05 NOTE — Telephone Encounter (Signed)
Steward Drone at [employer] phone number is [phone]

## 2011-03-05 NOTE — Telephone Encounter (Signed)
Steward Drone from [facility] transplant clinic called- pt is being seen there and they want to order a liver bx and ultrasound. The pt doesn't want to drive that far to have it done because his wife is having surgery. [facility] wants to know if we can order it and have it done here and send the slides and results to them. Pt has an OV appt  with [facility] on 03/19/11 but they are requesting it be done asap. They dont want to wait that long.  Please advise.

## 2011-03-05 NOTE — Telephone Encounter (Signed)
I called Steward Drone at [facility] and left a message on her answering service. I would like to get some more information before proceeding with scheduling a liver biopsy

## 2011-03-09 ENCOUNTER — Telehealth: Payer: Self-pay | Admitting: Internal Medicine

## 2011-03-09 NOTE — Telephone Encounter (Signed)
Pts wife called because they have not heard anything from [place] yet. They would like for you to call Steward Drone with [facility] on her personal cell- its [phone].

## 2011-03-10 LAB — CBC WITH DIFFERENTIAL/PLATELET
Albumin: 3.5
Bilirubin, Indirect: 2.8
Total Protein ELP: 7.2

## 2011-03-13 ENCOUNTER — Other Ambulatory Visit: Payer: Self-pay | Admitting: Internal Medicine

## 2011-03-13 NOTE — Telephone Encounter (Signed)
Pt aware we are working on setting up procedure.

## 2011-03-13 NOTE — Telephone Encounter (Signed)
Pt is scheduled for 03/21/11 @ 10:00- [facility] contacted the pt and made them aware of the appt-

## 2011-03-13 NOTE — Telephone Encounter (Signed)
Waiting on records from [facility]   - We have to fax them along with the order for review by the radiologist

## 2011-03-13 NOTE — Telephone Encounter (Signed)
Please set up a percutaneous liver biopsy at [facility] this week - in reference to elevated lfts

## 2011-03-13 NOTE — Telephone Encounter (Signed)
Records and order faxed to [facility] for US guided liver biopsy.

## 2011-03-13 NOTE — Telephone Encounter (Signed)
Tried to call pt- LMOM 

## 2011-03-15 ENCOUNTER — Telehealth: Payer: Self-pay

## 2011-03-15 NOTE — Telephone Encounter (Signed)
Spoke with pts wife- he is scheduled to see KJ on 09/07 @ 9:00- this is ok with the pt and his wife

## 2011-03-15 NOTE — Telephone Encounter (Signed)
Steward Drone RN at [employer] number is [phone]. KJ informed of what is going on with pt and given copy of lab spreadsheet from [facility] to review.

## 2011-03-15 NOTE — Telephone Encounter (Signed)
Spoke with CM, pt has not been seen in over a year. Pt needs ov. Please schedule. Thanks.

## 2011-03-15 NOTE — Telephone Encounter (Signed)
Steward Drone from [facility] called- pt went to ED over the weekend and they just received his labs from them. Pts LFTs are elevated. She sent me their spreadsheet with lab values written on it. AST=130, ALT=169, Alk Phos=395, T-billi=7.9. PA at [facility] is requesting we get an MRCP. Please advise.

## 2011-03-15 NOTE — Telephone Encounter (Signed)
LMOM for pt to call me back.

## 2011-03-16 ENCOUNTER — Encounter: Payer: Self-pay | Admitting: Urgent Care

## 2011-03-16 ENCOUNTER — Ambulatory Visit (INDEPENDENT_AMBULATORY_CARE_PROVIDER_SITE_OTHER): Payer: Medicare HMO | Admitting: Urgent Care

## 2011-03-16 VITALS — BP 134/87 | HR 93 | Temp 97.6°F | Ht 69.0 in | Wt 209.0 lb

## 2011-03-16 DIAGNOSIS — R945 Abnormal results of liver function studies: Secondary | ICD-10-CM

## 2011-03-16 DIAGNOSIS — E119 Type 2 diabetes mellitus without complications: Secondary | ICD-10-CM

## 2011-03-16 DIAGNOSIS — R7989 Other specified abnormal findings of blood chemistry: Secondary | ICD-10-CM

## 2011-03-16 DIAGNOSIS — Z944 Liver transplant status: Secondary | ICD-10-CM

## 2011-03-16 NOTE — Progress Notes (Signed)
Primary Care Physician:  Cassell Smiles., MD Primary Gastroenterologist:  Dr. Jena Gauss Hepatologist/Transplant Physician @ [facility] :  Dr. Jim Like RN Coordinator @ [facility]:  Berneda Rose Dermatologist:  Dr. Jorja Loa  Chief Complaint  Patient presents with  . MRCP    HPI:  [name] is a 70 y.o. male well known to Dr Jena Gauss, although he has not been seen here in over 1 yr.  He is over 5 yrs s/p orthotic liver transplant at [facility] for NASH cirrhosis.  He has done very well s/p transplant, but has struggled with his diabetes lately.  He tells me he started lantus insulin 2 1/2 wks ago & took x 3 days.  He then started vomiting & then itching the following day when he broke out in a rash all over.  He saw Dr. Sherwood Gambler & was given a shot & some prednisone which he completed yesterday.  He tells me his tacrolimus was adjusted recently because it was too low.  He was sent here from [facility] because there were plans for a liver bx given an increase in his LFTS.  Since his wife was to have surgery, he wanted testing to be done here in [place].  They are now requesting an MRCP be done here as well due to labs suggestive of mixed cholestatic pattern.  On 03/10/11 AST 130, ALT 169, ALP 395, TB 7.9, direct 5.1, tp 7.2, albumin 3.5.    His LFTs had previously been normal for the most part until this yr.  His hgb a1c has continued to climb.    He describes a similar episode on Aug 18 where he had N/V/D, but that was short-lived.  He has had high blood sugars ever since.  Last a1c 9.2 C/o pruritic rash.  Hx of rash for which he has been followed by Dr. Jorja Loa.  Mostly itched on hs back, now resolving.  C/o fatigue 1 mo.  He gives hx wt loss 30+ intentionally-cut carbs.  Exercises by working out in the yard, not intense.    Past Medical History  Diagnosis Date  . Hypothyroidism   . HTN (hypertension)   . CRI (chronic renal insufficiency)   . DM (diabetes mellitus)     Type II  . Anemia   . NASH (nonalcoholic  steatohepatitis)   . Squamous cell cancer of external ear     pinna  . S/P colonoscopy with polypectomy 2004    Dr Jena Gauss  . S/P endoscopy 03/2004    Hx esophgaeal varices, pyloric channel ulcer prior to transplant    Past Surgical History  Procedure Date  . Liver transplant 2006    [facility]-NASH cirrhosis    Current Outpatient Prescriptions  Medication Sig Dispense Refill  . aspirin 81 MG tablet Take 81 mg by mouth daily.        Marland Kitchen glimepiride (AMARYL) 4 MG tablet Take 4 mg by mouth 2 (two) times daily.       Marland Kitchen levothyroxine (SYNTHROID, LEVOTHROID) 100 MCG tablet Take 100 mcg by mouth daily.       Marland Kitchen lisinopril (PRINIVIL,ZESTRIL) 20 MG tablet Take 20 mg by mouth daily.       . magnesium 30 MG tablet Take 30 mg by mouth 2 (two) times daily. With protein       . Multiple Vitamin (MULTIVITAMIN) capsule Take 1 capsule by mouth daily.        . mycophenolate (CELLCEPT) 250 MG capsule Take by mouth 2 (two) times daily.        Marland Kitchen  saxagliptin HCl (ONGLYZA) 5 MG TABS tablet Take 5 mg by mouth daily.        . tacrolimus (PROGRAF) 1 MG capsule Take 1 mg by mouth 2 (two) times daily.          Allergies as of 03/16/2011 - Review Complete 03/16/2011  Allergen Reaction Noted  . Lantus Rash 03/16/2011    Family History  Problem Relation Age of Onset  . Cirrhosis Father     nash  . Lymphoma Mother   . Diabetes Daughter     History   Social History  . Marital Status: Married    Spouse Name: N/A    Number of Children: 1  . Years of Education: N/A   Occupational History  . RETIRED    Social History Main Topics  . Smoking status: Never Smoker   . Smokeless tobacco: Not on file  . Alcohol Use: No  . Drug Use: No  . Sexually Active: Not on file  Review of Systems: Gen: Denies any fever, chills.  See HPI. CV: Denies chest pain, angina, palpitations, syncope, orthopnea, PND, peripheral edema, and claudication. Resp: Denies dyspnea at rest, dyspnea with exercise, cough, sputum, wheezing,  coughing up blood, and pleurisy. GI: Denies vomiting blood, jaundice, and fecal incontinence.   Denies dysphagia or odynophagia. GU : Denies urinary burning, blood in urine, urinary frequency, urinary hesitancy, nocturnal urination, and urinary incontinence. MS: Denies joint pain, limitation of movement, and swelling, stiffness, low back pain, extremity pain. Denies muscle weakness, cramps, atrophy.  Derm: See HPI.Marland Kitchen  Psych: Denies depression, anxiety, memory loss, suicidal ideation, hallucinations, paranoia, and confusion. Heme: Denies bruising, bleeding, and enlarged lymph nodes.  Physical Exam: BP 134/87  Pulse 93  Temp(Src) 97.6 F (36.4 C) (Temporal)  Ht 5\' 9"  (1.753 m)  Wt 209 lb (94.802 kg)  BMI 30.86 kg/m2 General:   Alert,  Well-developed, obese, pleasant and cooperative in NAD Head:  Normocephalic and atraumatic. Eyes:  Sclera clear, no icterus.   Conjunctiva pink. Ears:  Normal auditory acuity. Nose:  No deformity, discharge,  or lesions. Mouth:  No deformity or lesions.  OP Neck:  Supple; no masses or thyromegaly. Lungs:  Clear throughout to auscultation.   No wheezes, crackles, or rhonchi. No acute distress. Heart:  Regular rate and rhythm; no murmurs, clicks, rubs,  or gallops. Abdomen:  Soft, obese, nontender and nondistended. No masses, hepatosplenomegaly or hernias noted. Normal bowel sounds, without guarding, and without rebound.   Rectal:  Deferred.  Msk:  Symmetrical without gross deformities. Normal posture. Pulses:  Normal pulses noted. Extremities:  Without clubbing or edema. Neurologic:  Alert and  oriented x4;  grossly normal neurologically. Skin:  Annular erythematous macular rash w/excoriation to upper back, abd, neck, legs. Cervical Nodes:  No significant cervical adenopathy. Psych:  Alert and cooperative. Normal mood and affect.

## 2011-03-16 NOTE — Patient Instructions (Signed)
Appointment w/ Dr Jorja Loa next week MRCP Monday

## 2011-03-17 ENCOUNTER — Encounter: Payer: Self-pay | Admitting: Urgent Care

## 2011-03-17 DIAGNOSIS — R945 Abnormal results of liver function studies: Secondary | ICD-10-CM | POA: Insufficient documentation

## 2011-03-17 NOTE — Assessment & Plan Note (Signed)
[name] is a 70 y.o. caucasian male w/ elevated LFTs including direct hyperbilirubinemia s/p orthotopic liver transplant over 5 yrs ago for NASH cirrhosis.  He also has had a rash & abdominal pain, vomiting & diarrhea which is resolving now after treatment w/ prednisone.  He is being evaluated in conjunction with correspondence from [facility].   Dr. Jena Gauss was asked to evaluation pt while in the office as well.  Plans are being made for liver bx and MRCP next week locally with follow up at [facility] pending results.  Differentials include host versus graft disease, acute drug reaction, or opportunistic infection.  Await labs drawn yesterday at [facility] Appt w/ Dr Jorja Loa ASAP Follow up w/ [facility] as planned FU MRCP & liver bx

## 2011-03-17 NOTE — Assessment & Plan Note (Signed)
Work w/ Dr Sherwood Gambler to improve Hgba1c

## 2011-03-18 ENCOUNTER — Encounter (HOSPITAL_COMMUNITY): Payer: Self-pay

## 2011-03-18 ENCOUNTER — Emergency Department (HOSPITAL_COMMUNITY): Payer: Medicare HMO

## 2011-03-18 ENCOUNTER — Inpatient Hospital Stay (HOSPITAL_COMMUNITY)
Admission: EM | Admit: 2011-03-18 | Discharge: 2011-03-20 | DRG: 638 | Disposition: A | Discharge status: Home / Self Care | Payer: Medicare HMO | Attending: Internal Medicine | Admitting: Internal Medicine

## 2011-03-18 DIAGNOSIS — Z944 Liver transplant status: Secondary | ICD-10-CM

## 2011-03-18 DIAGNOSIS — K7689 Other specified diseases of liver: Secondary | ICD-10-CM

## 2011-03-18 DIAGNOSIS — R945 Abnormal results of liver function studies: Secondary | ICD-10-CM

## 2011-03-18 DIAGNOSIS — L509 Urticaria, unspecified: Secondary | ICD-10-CM | POA: Diagnosis present

## 2011-03-18 DIAGNOSIS — M109 Gout, unspecified: Secondary | ICD-10-CM

## 2011-03-18 DIAGNOSIS — D649 Anemia, unspecified: Secondary | ICD-10-CM

## 2011-03-18 DIAGNOSIS — L282 Other prurigo: Secondary | ICD-10-CM | POA: Diagnosis present

## 2011-03-18 DIAGNOSIS — N189 Chronic kidney disease, unspecified: Secondary | ICD-10-CM

## 2011-03-18 DIAGNOSIS — N182 Chronic kidney disease, stage 2 (mild): Secondary | ICD-10-CM | POA: Diagnosis present

## 2011-03-18 DIAGNOSIS — E119 Type 2 diabetes mellitus without complications: Secondary | ICD-10-CM

## 2011-03-18 DIAGNOSIS — E039 Hypothyroidism, unspecified: Secondary | ICD-10-CM | POA: Diagnosis present

## 2011-03-18 DIAGNOSIS — Z9189 Other specified personal risk factors, not elsewhere classified: Secondary | ICD-10-CM

## 2011-03-18 DIAGNOSIS — R531 Weakness: Secondary | ICD-10-CM

## 2011-03-18 DIAGNOSIS — R7401 Elevation of levels of liver transaminase levels: Secondary | ICD-10-CM | POA: Diagnosis present

## 2011-03-18 DIAGNOSIS — I10 Essential (primary) hypertension: Secondary | ICD-10-CM | POA: Diagnosis present

## 2011-03-18 DIAGNOSIS — R7402 Elevation of levels of lactic acid dehydrogenase (LDH): Secondary | ICD-10-CM | POA: Diagnosis present

## 2011-03-18 DIAGNOSIS — R739 Hyperglycemia, unspecified: Secondary | ICD-10-CM

## 2011-03-18 DIAGNOSIS — K7200 Acute and subacute hepatic failure without coma: Secondary | ICD-10-CM

## 2011-03-18 DIAGNOSIS — K8050 Calculus of bile duct without cholangitis or cholecystitis without obstruction: Secondary | ICD-10-CM | POA: Diagnosis present

## 2011-03-18 DIAGNOSIS — IMO0001 Reserved for inherently not codable concepts without codable children: Principal | ICD-10-CM | POA: Diagnosis present

## 2011-03-18 LAB — DIFFERENTIAL
Basophils Absolute: 0 10*3/uL (ref 0.0–0.1)
Lymphocytes Relative: 3 % — ABNORMAL LOW (ref 12–46)
Neutro Abs: 19.4 10*3/uL — ABNORMAL HIGH (ref 1.7–7.7)

## 2011-03-18 LAB — CBC
HCT: 40.4 % (ref 39.0–52.0)
Platelets: 308 10*3/uL (ref 150–400)
RDW: 14.5 % (ref 11.5–15.5)
WBC: 21.2 10*3/uL — ABNORMAL HIGH (ref 4.0–10.5)

## 2011-03-18 LAB — BASIC METABOLIC PANEL
CO2: 21 mEq/L (ref 19–32)
Chloride: 97 mEq/L (ref 96–112)
Potassium: 5 mEq/L (ref 3.5–5.1)
Sodium: 129 mEq/L — ABNORMAL LOW (ref 135–145)

## 2011-03-18 LAB — URINALYSIS, ROUTINE W REFLEX MICROSCOPIC
Glucose, UA: >1000 mg/dL — AB
Leukocytes, UA: NEGATIVE
pH: 5 (ref 5.0–8.0)

## 2011-03-18 LAB — URINE MICROSCOPIC-ADD ON

## 2011-03-18 LAB — PROTIME-INR
INR: 1.2 (ref 0.00–1.49)
Prothrombin Time: 15.5 seconds — ABNORMAL HIGH (ref 11.6–15.2)

## 2011-03-18 LAB — GLUCOSE, CAPILLARY
Glucose-Capillary: 484 mg/dL — ABNORMAL HIGH (ref 70–99)
Glucose-Capillary: 245 mg/dL — ABNORMAL HIGH (ref 70–99)

## 2011-03-18 LAB — HEPATIC FUNCTION PANEL
AST: 286 U/L — ABNORMAL HIGH (ref 0–37)
Alkaline Phosphatase: 415 U/L — ABNORMAL HIGH (ref 39–117)
Bilirubin, Direct: 4.3 mg/dL — ABNORMAL HIGH (ref 0.0–0.3)
Total Bilirubin: 5.3 mg/dL — ABNORMAL HIGH (ref 0.3–1.2)

## 2011-03-18 LAB — PHOSPHORUS: Phosphorus: 2.4 mg/dL (ref 2.3–4.6)

## 2011-03-18 LAB — APTT: aPTT: 28 seconds (ref 24–37)

## 2011-03-18 LAB — MAGNESIUM: Magnesium: 1.8 mg/dL (ref 1.5–2.5)

## 2011-03-18 MED ORDER — OXYCODONE HCL 5 MG PO TABS: 5.0000 mg | ORAL_TABLET | ORAL | Status: DC | PRN

## 2011-03-18 MED ORDER — MYCOPHENOLATE MOFETIL 250 MG PO CAPS
500.0000 mg | ORAL_CAPSULE | Freq: Two times a day (BID) | ORAL | Status: DC
  Administered 2011-03-18 – 2011-03-19 (×2): 500 mg via ORAL
  Filled 2011-03-18 (×6): qty 2

## 2011-03-18 MED ORDER — DOCUSATE SODIUM 100 MG PO CAPS: 100.0000 mg | ORAL_CAPSULE | Freq: Two times a day (BID) | ORAL | Status: DC | PRN

## 2011-03-18 MED ORDER — LISINOPRIL 10 MG PO TABS
20.0000 mg | ORAL_TABLET | Freq: Every day | ORAL | Status: DC
  Administered 2011-03-19 – 2011-03-20 (×2): 20 mg via ORAL
  Filled 2011-03-18 (×2): qty 1
  Filled 2011-03-18: qty 2

## 2011-03-18 MED ORDER — TACROLIMUS 1 MG PO CAPS
1.0000 mg | ORAL_CAPSULE | Freq: Every evening | ORAL | Status: DC
  Administered 2011-03-19: 1 mg via ORAL
  Filled 2011-03-18 (×5): qty 1

## 2011-03-18 MED ORDER — SODIUM CHLORIDE 0.9 % IV SOLN
INTRAVENOUS | Status: DC
  Administered 2011-03-18 (×2): via INTRAVENOUS

## 2011-03-18 MED ORDER — LINAGLIPTIN 5 MG PO TABS
5.0000 mg | ORAL_TABLET | Freq: Every day | ORAL | Status: DC
  Administered 2011-03-19 – 2011-03-20 (×2): 5 mg via ORAL
  Filled 2011-03-18 (×4): qty 1

## 2011-03-18 MED ORDER — ONDANSETRON HCL 4 MG/2ML IJ SOLN: 4.0000 mg | Freq: Four times a day (QID) | INTRAMUSCULAR | Status: DC | PRN

## 2011-03-18 MED ORDER — FOLIC ACID 1 MG PO TABS
1.0000 mg | ORAL_TABLET | Freq: Every day | ORAL | Status: DC
  Administered 2011-03-19 – 2011-03-20 (×2): 1 mg via ORAL
  Filled 2011-03-18 (×2): qty 1

## 2011-03-18 MED ORDER — GLIMEPIRIDE 2 MG PO TABS
4.0000 mg | ORAL_TABLET | Freq: Two times a day (BID) | ORAL | Status: DC
  Administered 2011-03-18 – 2011-03-19 (×2): 4 mg via ORAL
  Filled 2011-03-18: qty 2

## 2011-03-18 MED ORDER — INSULIN ASPART 100 UNIT/ML ~~LOC~~ SOLN
0.0000 [IU] | SUBCUTANEOUS | Status: DC
  Administered 2011-03-19 (×3): 3 [IU] via SUBCUTANEOUS
  Administered 2011-03-19: 7 [IU] via SUBCUTANEOUS
  Administered 2011-03-19: 3 [IU] via SUBCUTANEOUS
  Filled 2011-03-18: qty 3

## 2011-03-18 MED ORDER — ASPIRIN EC 81 MG PO TBEC
81.0000 mg | DELAYED_RELEASE_TABLET | Freq: Every day | ORAL | Status: DC
  Administered 2011-03-19: 81 mg via ORAL
  Filled 2011-03-18 (×2): qty 1

## 2011-03-18 MED ORDER — DIPHENHYDRAMINE HCL 50 MG/ML IJ SOLN
12.5000 mg | Freq: Four times a day (QID) | INTRAMUSCULAR | Status: DC | PRN
  Administered 2011-03-18: 12.5 mg via INTRAVENOUS
  Filled 2011-03-18: qty 1

## 2011-03-18 MED ORDER — BISACODYL 10 MG RE SUPP: 10.0000 mg | RECTAL | Status: DC | PRN

## 2011-03-18 MED ORDER — ONDANSETRON HCL 4 MG PO TABS: 4.0000 mg | ORAL_TABLET | Freq: Four times a day (QID) | ORAL | Status: DC | PRN

## 2011-03-18 MED ORDER — SODIUM CHLORIDE 0.9 % IV BOLUS (SEPSIS): 500.0000 mL | Freq: Once | INTRAVENOUS | Status: DC

## 2011-03-18 MED ORDER — FLEET ENEMA 7-19 GM/118ML RE ENEM: 1.0000 | ENEMA | RECTAL | Status: DC | PRN

## 2011-03-18 MED ORDER — TACROLIMUS 1 MG PO CAPS
1.0000 mg | ORAL_CAPSULE | Freq: Two times a day (BID) | ORAL | Status: DC
  Filled 2011-03-18 (×2): qty 1

## 2011-03-18 MED ORDER — SODIUM CHLORIDE 0.9 % IV BOLUS (SEPSIS)
1000.0000 mL | Freq: Once | INTRAVENOUS | Status: AC
  Administered 2011-03-18: 1000 mL via INTRAVENOUS

## 2011-03-18 MED ORDER — LEVOTHYROXINE SODIUM 100 MCG PO TABS
100.0000 ug | ORAL_TABLET | Freq: Every day | ORAL | Status: DC
  Administered 2011-03-19 – 2011-03-20 (×2): 100 ug via ORAL
  Filled 2011-03-18 (×2): qty 1

## 2011-03-18 MED ORDER — SODIUM CHLORIDE 0.9 % IV SOLN
INTRAVENOUS | Status: DC
  Administered 2011-03-19 – 2011-03-20 (×2): via INTRAVENOUS

## 2011-03-18 MED ORDER — TACROLIMUS 1 MG PO CAPS: 1.0000 mg | ORAL_CAPSULE | Freq: Two times a day (BID) | ORAL | Status: DC

## 2011-03-18 MED ORDER — INSULIN REGULAR HUMAN 100 UNIT/ML IJ SOLN
10.0000 [IU] | Freq: Once | INTRAMUSCULAR | Status: AC
  Administered 2011-03-18: 10 [IU] via INTRAVENOUS
  Filled 2011-03-18: qty 0.1

## 2011-03-18 MED ORDER — TACROLIMUS 1 MG PO CAPS
2.0000 mg | ORAL_CAPSULE | ORAL | Status: DC
  Administered 2011-03-19 – 2011-03-20 (×2): 2 mg via ORAL
  Filled 2011-03-18 (×4): qty 2

## 2011-03-18 MED ORDER — ONDANSETRON HCL 4 MG/2ML IJ SOLN
4.0000 mg | Freq: Once | INTRAMUSCULAR | Status: AC
  Administered 2011-03-18: 4 mg via INTRAVENOUS
  Filled 2011-03-18: qty 2

## 2011-03-18 MED ORDER — TRAZODONE HCL 50 MG PO TABS: 25.0000 mg | ORAL_TABLET | Freq: Every evening | ORAL | Status: DC | PRN

## 2011-03-18 NOTE — ED Provider Notes (Addendum)
History     CSN: [id number] Arrival date & time: 03/18/2011  1:31 PM  Chief Complaint  Patient presents with  . Hypotension  . Hyperglycemia  . Weakness  . Shortness of Breath    on exertion   HPI patient complains of weakness him a shortness of breath with exertion, low blood pressure, high blood sugar for several days. Poor appetite has felt poorly for a couple weeks after a  viral intestinal illness. No fever or chills.  Past Medical History  Diagnosis Date  . Hypothyroidism   . HTN (hypertension)   . CRI (chronic renal insufficiency)   . DM (diabetes mellitus)     Type II  . Anemia   . NASH (nonalcoholic steatohepatitis)   . Squamous cell cancer of external ear     pinna  . S/P colonoscopy with polypectomy 2004    Dr Jena Gauss  . S/P endoscopy 03/2004    Hx esophgaeal varices, pyloric channel ulcer prior to transplant    Past Surgical History  Procedure Date  . Liver transplant 2006    [facility] cirrhosis    Family History  Problem Relation Age of Onset  . Cirrhosis Father     nash  . Lymphoma Mother   . Diabetes Daughter     History  Substance Use Topics  . Smoking status: Never Smoker   . Smokeless tobacco: Not on file  . Alcohol Use: No      Review of Systems  All other systems reviewed and are negative.    Physical Exam  BP 126/82  Pulse 97  Resp 20  Ht 5\' 9"  (1.753 m)  Wt 209 lb (94.802 kg)  BMI 30.86 kg/m2  SpO2 99%  Physical Exam  Nursing note and vitals reviewed. Constitutional: He is oriented to person, place, and time. He appears well-developed and well-nourished.  HENT:  Head: Normocephalic and atraumatic.  Eyes: Conjunctivae and EOM are normal. Pupils are equal, round, and reactive to light.  Neck: Normal range of motion. Neck supple.  Cardiovascular: Normal rate and regular rhythm.   Pulmonary/Chest: Effort normal and breath sounds normal.  Abdominal: Soft. Bowel sounds are normal.  Musculoskeletal: Normal range of motion.    Neurological: He is alert and oriented to person, place, and time.  Skin: Skin is warm and dry.       jaundiced  Psychiatric: He has a normal mood and affect.    ED Course  Procedures Results for orders placed during the hospital encounter of 03/18/11  GLUCOSE, CAPILLARY      Component Value Range   Glucose-Capillary 484 (*) 70 - 99 (mg/dL)   Comment 1 Documented in Chart     Comment 2 Notify RN    CBC      Component Value Range   WBC 21.2 (*) 4.0 - 10.5 (K/uL)   RBC 4.43  4.22 - 5.81 (MIL/uL)   Hemoglobin 13.5  13.0 - 17.0 (g/dL)   HCT 40.9  81.1 - 91.4 (%)   MCV 91.2  78.0 - 100.0 (fL)   MCH 30.5  26.0 - 34.0 (pg)   MCHC 33.4  30.0 - 36.0 (g/dL)   RDW 78.2  95.6 - 21.3 (%)   Platelets 308  150 - 400 (K/uL)  DIFFERENTIAL      Component Value Range   Neutrophils Relative 92 (*) 43 - 77 (%)   Neutro Abs 19.4 (*) 1.7 - 7.7 (K/uL)   Lymphocytes Relative 3 (*) 12 - 46 (%)  Lymphs Abs 0.6 (*) 0.7 - 4.0 (K/uL)   Monocytes Relative 5  3 - 12 (%)   Monocytes Absolute 1.1 (*) 0.1 - 1.0 (K/uL)   Eosinophils Relative 0  0 - 5 (%)   Eosinophils Absolute 0.0  0.0 - 0.7 (K/uL)   Basophils Relative 0  0 - 1 (%)   Basophils Absolute 0.0  0.0 - 0.1 (K/uL)  BASIC METABOLIC PANEL      Component Value Range   Sodium 129 (*) 135 - 145 (mEq/L)   Potassium 5.0  3.5 - 5.1 (mEq/L)   Chloride 97  96 - 112 (mEq/L)   CO2 21  19 - 32 (mEq/L)   Glucose, Bld 492 (*) 70 - 99 (mg/dL)   BUN 46 (*) 6 - 23 (mg/dL)   Creatinine, Ser 4.09  0.50 - 1.35 (mg/dL)   Calcium 81.1  8.4 - 10.5 (mg/dL)   GFR calc non Af Amer >60  >60 (mL/min)   GFR calc Af Amer >60  >60 (mL/min)  URINALYSIS, ROUTINE W REFLEX MICROSCOPIC      Component Value Range   Color, Urine YELLOW  YELLOW    Appearance CLEAR  CLEAR    Specific Gravity, Urine 1.020  1.005 - 1.030    pH 5.0  5.0 - 8.0    Glucose, UA >1000 (*) NEGATIVE (mg/dL)   Hgb urine dipstick TRACE (*) NEGATIVE    Bilirubin Urine MODERATE (*) NEGATIVE    Ketones,  ur 15 (*) NEGATIVE (mg/dL)   Protein, ur NEGATIVE  NEGATIVE (mg/dL)   Urobilinogen, UA 0.2  0.0 - 1.0 (mg/dL)   Nitrite NEGATIVE  NEGATIVE    Leukocytes, UA NEGATIVE  NEGATIVE   URINE MICROSCOPIC-ADD ON      Component Value Range   Squamous Epithelial / LPF RARE  RARE    WBC, UA 0-2  <3 (WBC/hpf)   RBC / HPF 3-6  <3 (RBC/hpf)   Bacteria, UA FEW (*) RARE   LIPASE, BLOOD      Component Value Range   Lipase 17  11 - 59 (U/L)  HEPATIC FUNCTION PANEL      Component Value Range   Total Protein 6.3  6.0 - 8.3 (g/dL)   Albumin 2.7 (*) 3.5 - 5.2 (g/dL)   AST 914 (*) 0 - 37 (U/L)   ALT 377 (*) 0 - 53 (U/L)   Alkaline Phosphatase 415 (*) 39 - 117 (U/L)   Total Bilirubin 5.3 (*) 0.3 - 1.2 (mg/dL)   Bilirubin, Direct 4.3 (*) 0.0 - 0.3 (mg/dL)   Indirect Bilirubin 1.0 (*) 0.3 - 0.9 (mg/dL)  GLUCOSE, CAPILLARY      Component Value Range   Glucose-Capillary 245 (*) 70 - 99 (mg/dL)  MAGNESIUM      Component Value Range   Magnesium 1.8  1.5 - 2.5 (mg/dL)  PHOSPHORUS      Component Value Range   Phosphorus 2.4  2.3 - 4.6 (mg/dL)  APTT      Component Value Range   aPTT 28  24 - 37 (seconds)  PROTIME-INR      Component Value Range   Prothrombin Time 15.5 (*) 11.6 - 15.2 (seconds)   INR 1.20  0.00 - 1.49   TSH      Component Value Range   TSH 2.424  0.350 - 4.500 (uIU/mL)  HEMOGLOBIN A1C      Component Value Range   Hemoglobin A1C 10.0 (*) <5.7 (%)   Mean Plasma Glucose 240 (*) <117 (mg/dL)  GLUCOSE, CAPILLARY      Component Value Range   Glucose-Capillary 296 (*) 70 - 99 (mg/dL)  CBC      Component Value Range   WBC 19.5 (*) 4.0 - 10.5 (K/uL)   RBC 3.78 (*) 4.22 - 5.81 (MIL/uL)   Hemoglobin 11.7 (*) 13.0 - 17.0 (g/dL)   HCT 40.9 (*) 81.1 - 52.0 (%)   MCV 90.5  78.0 - 100.0 (fL)   MCH 31.0  26.0 - 34.0 (pg)   MCHC 34.2  30.0 - 36.0 (g/dL)   RDW 91.4  78.2 - 95.6 (%)   Platelets 258  150 - 400 (K/uL)  COMPREHENSIVE METABOLIC PANEL      Component Value Range   Sodium 132 (*)  135 - 145 (mEq/L)   Potassium 4.9  3.5 - 5.1 (mEq/L)   Chloride 105  96 - 112 (mEq/L)   CO2 23  19 - 32 (mEq/L)   Glucose, Bld 292 (*) 70 - 99 (mg/dL)   BUN 33 (*) 6 - 23 (mg/dL)   Creatinine, Ser 2.13  0.50 - 1.35 (mg/dL)   Calcium 9.1  8.4 - 08.6 (mg/dL)   Total Protein 6.2  6.0 - 8.3 (g/dL)   Albumin 2.4 (*) 3.5 - 5.2 (g/dL)   AST 578 (*) 0 - 37 (U/L)   ALT 295 (*) 0 - 53 (U/L)   Alkaline Phosphatase 357 (*) 39 - 117 (U/L)   Total Bilirubin 6.8 (*) 0.3 - 1.2 (mg/dL)   GFR calc non Af Amer >60  >60 (mL/min)   GFR calc Af Amer >60  >60 (mL/min)  GLUCOSE, CAPILLARY      Component Value Range   Glucose-Capillary 312 (*) 70 - 99 (mg/dL)  GLUCOSE, CAPILLARY      Component Value Range   Glucose-Capillary 223 (*) 70 - 99 (mg/dL)   Comment 1 Notify RN    GLUCOSE, CAPILLARY      Component Value Range   Glucose-Capillary 210 (*) 70 - 99 (mg/dL)   Comment 1 Notify RN    GLUCOSE, CAPILLARY      Component Value Range   Glucose-Capillary 238 (*) 70 - 99 (mg/dL)  GLUCOSE, CAPILLARY      Component Value Range   Glucose-Capillary 244 (*) 70 - 99 (mg/dL)   Dg Chest 2 View  10/13/9627  *RADIOLOGY REPORT*  Clinical Data: Weakness.  CHEST - 2 VIEW  Comparison: Multiple exams, including 09/11/2005  Findings: Chronic mild elevation of the right hemidiaphragm noted.  There is mild tortuosity of the thoracic aorta.  Thoracic spondylosis is present.  The lungs appear clear.  No pleural effusion identified.  IMPRESSION:  1.  Mild chronic elevation of the right hemidiaphragm. 2.  Thoracic spondylosis.  Original Report Authenticated By: Dellia Cloud, M.D.   Mr 3d Recon At Scanner  03/19/2011  *RADIOLOGY REPORT*  Clinical Data:  Liver transplant in 2006.  Elevated liver function tests.  Liver biopsy is scheduled for Wednesday.  MRI ABDOMEN WITHOUT AND WITH CONTRAST (INCLUDING MRCP)  Technique:  Multiplanar multisequence MR imaging of the abdomen was performed both before and after the administration  of intravenous contrast. Heavily T2-weighted images of the biliary and pancreatic ducts were obtained, and three-dimensional MRCP images were rendered by post processing.  Contrast: 20 ml Multihance  Comparison:    No prior CT or MRI.  Findings:  Mild right hemidiaphragm elevation.  Trace right-sided pleural fluid.  High medial segment left hepatic lobe 9 mm cyst on image 18 of  series 4.  Atrophy of the left lobe of the liver, without specific evidence of cirrhosis.  Marked paraesophageal varices, including on image 39 of series 17.  Normal spleen, stomach, adrenal glands.  Bilateral renal cysts.  Moderate intra and extrahepatic biliary ductal dilatation.  The common duct measures 1.6 cm proximally.  Within the proximal common duct is a 2.9 x 1.6 cm T2 hypointense, mildly T1 hyperintense filling defect on image 21 of series 3. More inferiorly positioned filling defects within the common duct measure up to 1.7 x 1.0 cm on image 20 of series 3.  Also T2 hypointense and precontrast T1 hyperintense.  Smaller filling defects also suspected in this area.  No pancreatic ductal dilatation or acute pancreatitis.  Tiny foci of side branch duct ectasia within the pancreas, including images 35 and 39 of series 4.  Gallbladder likely surgically absent with normal appearing cystic duct remnant.  Portal veins and hepatic veins are patent.  The hepatic artery is tortuous, including on images 71 - 75 of series 15.  This could be within the region of the surgical anastomoses.  The more distal vessel is patent.  The liver opacifies normally.  No abdominal adenopathy or ascites.  IMPRESSION: 1.   Biliary ductal dilatation, with numerous common duct filling defects.  Most likely stones.  Given the history of prior liver transplant, and T1 hyperintensity, differential considerations of blood clot or less likely sloughed epithelium should also be considered.  ERCP suggested.  2.  Atrophy of the left lobe of the liver without specific  evidence of residual or recurrent cirrhosis. 3.  Trace right-sided pleural fluid. 4.  Marked tortuosity of the hepatic artery.  Cannot exclude stenosis in the region of the presumed hepatic artery to hepatic artery anastomosis. 5.  Esophageal varices. 6. Side branch pancreatic duct ectasia suggests chronic pancreatitis.  Original Report Authenticated By: Consuello Bossier, M.D.   Mr Jorja Loa Cm/mrcp  03/19/2011  *RADIOLOGY REPORT*  Clinical Data:  Liver transplant in 2006.  Elevated liver function tests.  Liver biopsy is scheduled for Wednesday.  MRI ABDOMEN WITHOUT AND WITH CONTRAST (INCLUDING MRCP)  Technique:  Multiplanar multisequence MR imaging of the abdomen was performed both before and after the administration of intravenous contrast. Heavily T2-weighted images of the biliary and pancreatic ducts were obtained, and three-dimensional MRCP images were rendered by post processing.  Contrast: 20 ml Multihance  Comparison:    No prior CT or MRI.  Findings:  Mild right hemidiaphragm elevation.  Trace right-sided pleural fluid.  High medial segment left hepatic lobe 9 mm cyst on image 18 of series 4.  Atrophy of the left lobe of the liver, without specific evidence of cirrhosis.  Marked paraesophageal varices, including on image 39 of series 17.  Normal spleen, stomach, adrenal glands.  Bilateral renal cysts.  Moderate intra and extrahepatic biliary ductal dilatation.  The common duct measures 1.6 cm proximally.  Within the proximal common duct is a 2.9 x 1.6 cm T2 hypointense, mildly T1 hyperintense filling defect on image 21 of series 3. More inferiorly positioned filling defects within the common duct measure up to 1.7 x 1.0 cm on image 20 of series 3.  Also T2 hypointense and precontrast T1 hyperintense.  Smaller filling defects also suspected in this area.  No pancreatic ductal dilatation or acute pancreatitis.  Tiny foci of side branch duct ectasia within the pancreas, including images 35 and 39 of series 4.   Gallbladder likely surgically absent with normal appearing  cystic duct remnant.  Portal veins and hepatic veins are patent.  The hepatic artery is tortuous, including on images 71 - 75 of series 15.  This could be within the region of the surgical anastomoses.  The more distal vessel is patent.  The liver opacifies normally.  No abdominal adenopathy or ascites.  IMPRESSION: 1.   Biliary ductal dilatation, with numerous common duct filling defects.  Most likely stones.  Given the history of prior liver transplant, and T1 hyperintensity, differential considerations of blood clot or less likely sloughed epithelium should also be considered.  ERCP suggested.  2.  Atrophy of the left lobe of the liver without specific evidence of residual or recurrent cirrhosis. 3.  Trace right-sided pleural fluid. 4.  Marked tortuosity of the hepatic artery.  Cannot exclude stenosis in the region of the presumed hepatic artery to hepatic artery anastomosis. 5.  Esophageal varices. 6. Side branch pancreatic duct ectasia suggests chronic pancreatitis.  Original Report Authenticated By: Consuello Bossier, M.D.    MDM:  Pt doesn't feel well;  Has procedure in morning at [facility];  Obviously jaundiced;  admit      Donnetta Hutching, MD 03/19/11 1610  Donnetta Hutching, MD 03/30/11 [phone]

## 2011-03-18 NOTE — H&P (Addendum)
PCP:   Cassell Smiles., MD   Gastroenterologist  Dr. Roetta Sessions  Hepatologist ([facility])  Dr. Trinda Pascal Jonne Ply PA  Hepatology resource line  [place], [place]) [phone] / [phone]  Chief Complaint:  Elevated blood sugar not feeling well since today   HPI: Is a 70 year old diabetic Caucasian gentleman status post liver transplant 07/07/2005, and has been quite stable and is now seen only yearly at the transplant center at [facility], but only for about the past months as had a deterioration in his diabetic control and then his blood sugars rose into the 400s an attempt was made to start him on Lantus. He developed acute papular urticaria in response to the Lantus and required treatment with epinephrine, Benadryl and prednisone. Since then  he has continued to have problems with blood sugars typically in the 300s and today they were all over 400 and he was feeling very weak and sick and he decided to come to the emergency room for evaluation. He does have an appointment with Dr. Talmage Coin in November but doesn't feel he can wait that long.   Additionally patient's liver functions have started to deteriorate, on Septembers 1, total bilirubin was 7.9 AST 113 ALT 169 ALP 395. They had previously been normal for most part until this year.  He is scheduled to have a liver biopsy Wednesday, September 12 at [facility] He is scheduled to have an MRCP at [facility] outpatient September 10, 9:00 AM.  Because he is scheduled to have this study done tomorrow and his wife is also scheduled to [facility] an [facility] on Tuesday, he decided to his is best for him to calm to be evaluated today since he is not doing well on his own.   In the emergency room his received intravenous insulin and is feeling somewhat better. Denies chest pains or shortness of breath he denies not he denies vomiting but is having a little nausea.    Review of Systems:  The patient denies anorexia, fever, weight  loss,, vision loss, decreased hearing, hoarseness, chest pain, syncope, dyspnea on exertion, peripheral edema, balance deficits, hemoptysis, abdominal pain, melena, hematochezia, severe indigestion/heartburn, hematuria, incontinence, genital sores, muscle weakness, suspicious skin lesions, transient blindness, difficulty walking, depression, unusual weight change, abnormal bleeding, enlarged lymph nodes, angioedema, and breast masses.  Past Medical History: Past Medical History  Diagnosis Date  . Hypothyroidism   . HTN (hypertension)   . CRI (chronic renal insufficiency)   . DM (diabetes mellitus)     Type II  . Anemia   . NASH (nonalcoholic steatohepatitis)   . Squamous cell cancer of external ear     pinna  . S/P colonoscopy with polypectomy 2004    Dr Jena Gauss  . S/P endoscopy 03/2004    Hx esophgaeal varices, pyloric channel ulcer prior to transplant   Past Surgical History  Procedure Date  . Liver transplant 2006    [facility] cirrhosis  . Mouth surgery may 2012    Medications: Prior to Admission medications   Medication Sig Start Date End Date Taking? Authorizing Provider  aspirin 81 MG tablet Take 81 mg by mouth daily.     Yes Historical Provider, MD  docusate sodium (EQUATE STOOL SOFTENER) 100 MG capsule Take 100 mg by mouth daily as needed. For occasional constipation    Yes Historical Provider, MD  glimepiride (AMARYL) 4 MG tablet Take 4 mg by mouth 2 (two) times daily.  02/24/11  Yes Historical Provider, MD  levothyroxine (  SYNTHROID, LEVOTHROID) 100 MCG tablet Take 100 mcg by mouth daily.  02/24/11  Yes Historical Provider, MD  lisinopril (PRINIVIL,ZESTRIL) 20 MG tablet Take 20 mg by mouth daily.  02/24/11  Yes Historical Provider, MD  magnesium 30 MG tablet Take 30 mg by mouth 2 (two) times daily. With protein    Yes Historical Provider, MD  Multiple Vitamin (MULTIVITAMIN) capsule Take 1 capsule by mouth daily.     Yes Historical Provider, MD  Multiple Vitamins-Minerals  (MULTIVITAMIN WITH MINERALS) tablet Take 1 tablet by mouth daily.     Yes Historical Provider, MD  mycophenolate (CELLCEPT) 250 MG capsule Take 500 mg by mouth 2 (two) times daily.    Yes Historical Provider, MD  predniSONE (STERAPRED UNI-PAK) 5 MG TABS Take 5 mg by mouth Once daily as needed. 03/10/11  Yes Historical Provider, MD  saxagliptin HCl (ONGLYZA) 5 MG TABS tablet Take 5 mg by mouth daily.     Yes Historical Provider, MD  Specialty Vitamins Products (MAGNESIUM, AMINO ACID CHELATE,) 133 MG tablet Take 1 tablet by mouth 2 (two) times daily. Magnesium 133mg     Yes Historical Provider, MD  tacrolimus (PROGRAF) 1 MG capsule Take 1-2 mg by mouth 2 (two) times daily. Takes 2 pills every morning and 1 pill at supper   Yes Historical Provider, MD    Allergies:   Allergies  Allergen Reactions  . Lantus Rash    Itching, reaction required epinephrine shot    Social History:  reports that he has never smoked. He has never used smokeless tobacco. He reports that he does not drink alcohol or use illicit drugs.  Family History: Family History  Problem Relation Age of Onset  . Cirrhosis Father     nash  . Lymphoma Mother   . Diabetes Daughter     Physical Exam: Filed Vitals:   03/18/11 1322 03/18/11 2104 03/18/11 2218  BP: 126/82 112/52 120/78  Pulse: 97 87 101  Temp:  98.2 F (36.8 C) 98.2 F (36.8 C)  TempSrc:   Oral  Resp: 20  20  Height: 5\' 9"  (1.753 m)  5\' 9"  (1.753 m)  Weight: 94.802 kg (209 lb)  96.1 kg (211 lb 13.8 oz)  SpO2: 99% 98% 98%   General appearance: alert, cooperative and morbidly obese Head: Normocephalic, without obvious abnormality, atraumatic Eyes: conjunctivae/corneas clear. PERRL, Throat: lips, mucosa, and tongue dry;  Neck: thick neck; no adenopathy, no carotid bruit, no JVD, supple, symmetrical, trachea midline and thyroid not enlarged,  Back: symmetric, no curvature. ROM normal. No CVA tenderness. Resp: clear to auscultation bilaterally Chest wall:  no tenderness Cardio: regular rate and rhythm, S1, S2 normal, no murmur, click, rub or gallop GI: obese, soft, non-tender; bowel sounds normal; no masses,  no organomegaly Extremities: extremities normal, atraumatic, no cyanosis or edema Pulses: 2+ and symmetric Skin: papular urticaria of trunk and legs, with secondary ulceration from scratching. Neurologic: Grossly normal   Labs on Admission:   [facility] 03/18/11 1351  NA 129*  K 5.0  CL 97  CO2 21  GLUCOSE 492*  BUN 46*  CREATININE 1.06  CALCIUM 10.1  MG --  PHOS --    Basename 03/18/11 1715  AST 286*  ALT 377*  ALKPHOS 415*  BILITOT 5.3*  PROT 6.3  ALBUMIN 2.7*    Basename 03/18/11 1715  LIPASE 17  AMYLASE --    Basename 03/18/11 1351  WBC 21.2*  NEUTROABS 19.4*  HGB 13.5  HCT 40.4  MCV 91.2  PLT  308     Radiological Exams on Admission: Dg Chest 2 View  03/18/2011  *RADIOLOGY REPORT*  Clinical Data: Weakness.  CHEST - 2 VIEW  Comparison: Multiple exams, including 09/11/2005  Findings: Chronic mild elevation of the right hemidiaphragm noted.  There is mild tortuosity of the thoracic aorta.  Thoracic spondylosis is present.  The lungs appear clear.  No pleural effusion identified.  IMPRESSION:  1.  Mild chronic elevation of the right hemidiaphragm. 2.  Thoracic spondylosis.  Original Report Authenticated By: Dellia Cloud, M.D.    Assessment/Plan Present on Admission:  DIABETES MELLITUS, TYPE II, uncontrolled .HYPOTHYROIDISM .LFTs abnormal .Hypercalcemia .HYPERTENSION .Other chronic nonalcoholic liver disease .RENAL INSUFFICIENCY, CHRONIC .Papular urticaria  will put this gentleman on a sliding scale regimen since he can tolerate normal will send a consult to Dr. Fransico Him, endocrinologist, for assistance with starting this gentleman on an insulin regimen    we'll keep him n.p.o. all in preparation his MRCP in the morning, and  will consult his gastroenterologist while he is in hospital ,since his  liver enzymes have deteriorated somewhat; but it may be that this gentleman only needs to be here on observation.  Other plans as per orders  Tera Pellicane 03/18/2011, 10:48 PM

## 2011-03-18 NOTE — ED Notes (Signed)
Pt presents with c/o high blood sugar, hypotension, weakness, and SOB when walking. Pt also states he hasn't  been wanting to eat and has had increase in BM. BP stable in triage. Pt to triage in w/c. Pt states symptoms started approx 2 weeks ago after having a "stomach bug".

## 2011-03-18 NOTE — ED Notes (Signed)
Dr. Orvan Falconer in er with patient for evaluation

## 2011-03-19 ENCOUNTER — Ambulatory Visit: Payer: Self-pay | Admitting: Internal Medicine

## 2011-03-19 ENCOUNTER — Ambulatory Visit (HOSPITAL_COMMUNITY): Admission: RE | Admit: 2011-03-19 | Payer: Medicare HMO | Source: Ambulatory Visit

## 2011-03-19 ENCOUNTER — Inpatient Hospital Stay (HOSPITAL_COMMUNITY): Payer: Medicare HMO

## 2011-03-19 DIAGNOSIS — I8500 Esophageal varices without bleeding: Secondary | ICD-10-CM

## 2011-03-19 DIAGNOSIS — K861 Other chronic pancreatitis: Secondary | ICD-10-CM

## 2011-03-19 HISTORY — DX: Esophageal varices without bleeding: I85.00

## 2011-03-19 HISTORY — PX: OTHER SURGICAL HISTORY: SHX169

## 2011-03-19 HISTORY — DX: Other chronic pancreatitis: K86.1

## 2011-03-19 LAB — CBC
HCT: 34.2 % — ABNORMAL LOW (ref 39.0–52.0)
Hemoglobin: 11.7 g/dL — ABNORMAL LOW (ref 13.0–17.0)
MCH: 31 pg (ref 26.0–34.0)
MCHC: 34.2 g/dL (ref 30.0–36.0)
MCV: 90.5 fL (ref 78.0–100.0)
RBC: 3.78 MIL/uL — ABNORMAL LOW (ref 4.22–5.81)

## 2011-03-19 LAB — COMPREHENSIVE METABOLIC PANEL
ALT: 295 U/L — ABNORMAL HIGH (ref 0–53)
Alkaline Phosphatase: 357 U/L — ABNORMAL HIGH (ref 39–117)
BUN: 33 mg/dL — ABNORMAL HIGH (ref 6–23)
CO2: 23 mEq/L (ref 19–32)
Calcium: 9.1 mg/dL (ref 8.4–10.5)
GFR calc Af Amer: >60 mL/min (ref >60)
GFR calc non Af Amer: >60 mL/min (ref >60)
Glucose, Bld: 292 mg/dL — ABNORMAL HIGH (ref 70–99)
Sodium: 132 mEq/L — ABNORMAL LOW (ref 135–145)

## 2011-03-19 LAB — HEMOGLOBIN A1C: Mean Plasma Glucose: 240 mg/dL — ABNORMAL HIGH (ref <117)

## 2011-03-19 LAB — GLUCOSE, CAPILLARY
Glucose-Capillary: 215 mg/dL — ABNORMAL HIGH (ref 70–99)
Glucose-Capillary: 238 mg/dL — ABNORMAL HIGH (ref 70–99)
Glucose-Capillary: 244 mg/dL — ABNORMAL HIGH (ref 70–99)
Glucose-Capillary: 312 mg/dL — ABNORMAL HIGH (ref 70–99)

## 2011-03-19 MED ORDER — SODIUM CHLORIDE 0.9 % IJ SOLN
INTRAMUSCULAR | Status: AC
  Administered 2011-03-19: 3 mL
  Filled 2011-03-19: qty 3

## 2011-03-19 MED ORDER — GADOBENATE DIMEGLUMINE 529 MG/ML IV SOLN
20.0000 mL | Freq: Once | INTRAVENOUS | Status: AC | PRN
  Administered 2011-03-19: 20 mL via INTRAVENOUS

## 2011-03-19 MED ORDER — INSULIN ASPART 100 UNIT/ML ~~LOC~~ SOLN
0.0000 [IU] | Freq: Every day | SUBCUTANEOUS | Status: DC
  Administered 2011-03-19: 2 [IU] via SUBCUTANEOUS

## 2011-03-19 MED ORDER — HYDROXYZINE HCL 25 MG PO TABS
25.0000 mg | ORAL_TABLET | Freq: Three times a day (TID) | ORAL | Status: DC | PRN
  Administered 2011-03-19 – 2011-03-20 (×3): 25 mg via ORAL
  Filled 2011-03-19 (×3): qty 1

## 2011-03-19 MED ORDER — MYCOPHENOLATE MOFETIL 250 MG PO CAPS
500.0000 mg | ORAL_CAPSULE | Freq: Two times a day (BID) | ORAL | Status: DC
  Administered 2011-03-19 – 2011-03-20 (×3): 500 mg via ORAL
  Filled 2011-03-19 (×5): qty 2

## 2011-03-19 MED ORDER — INSULIN ASPART 100 UNIT/ML ~~LOC~~ SOLN
0.0000 [IU] | Freq: Three times a day (TID) | SUBCUTANEOUS | Status: DC
Start: 2011-03-20 — End: 2011-03-20
  Administered 2011-03-20: 3 [IU] via SUBCUTANEOUS

## 2011-03-19 MED ORDER — SODIUM CHLORIDE 0.9 % IJ SOLN
INTRAMUSCULAR | Status: AC
  Administered 2011-03-19: 07:00:00
  Filled 2011-03-19: qty 3

## 2011-03-19 MED ORDER — DIPHENHYDRAMINE HCL 50 MG/ML IJ SOLN
25.0000 mg | Freq: Four times a day (QID) | INTRAMUSCULAR | Status: DC | PRN
  Administered 2011-03-19: 25 mg via INTRAVENOUS
  Filled 2011-03-19: qty 1

## 2011-03-19 MED ORDER — INSULIN ASPART 100 UNIT/ML ~~LOC~~ SOLN
10.0000 [IU] | Freq: Three times a day (TID) | SUBCUTANEOUS | Status: DC
  Administered 2011-03-20 (×3): 10 [IU] via SUBCUTANEOUS

## 2011-03-19 MED ORDER — INSULIN ASPART PROT & ASPART (70-30 MIX) 100 UNIT/ML ~~LOC~~ SUSP
10.0000 [IU] | Freq: Two times a day (BID) | SUBCUTANEOUS | Status: DC
  Administered 2011-03-20 (×2): 10 [IU] via SUBCUTANEOUS
  Filled 2011-03-19: qty 3

## 2011-03-19 NOTE — Progress Notes (Addendum)
Subjective: This man was admitted yesterday for elevated blood sugars. Has a history of liver disease having had a liver transplant in 2006. However, more recently he has had abnormal liver functions/enzymes and is due to have a liver biopsy in 2 days time at [facility]. Also he just had ERCP with Dr. Jena Gauss this morning for  abnormal MRI of the abdomen. The MRI of the abdomen showed biliary duct dilatation with numerous common duct telling defects, most likely stones. The patient did not tolerate Lantus insulin, which produced a rash. The patient's diabetes is not controlled with oral hypoglycemic agents alone.           Physical Exam: Blood pressure 112/73, pulse 84, temperature 98.5 F (36.9 C), temperature source Oral, resp. rate 20, height 5\' 9"  (1.753 m), weight 96.253 kg (212 lb 3.2 oz), SpO2 99.00%. He looks systemically well. Heart sounds are present and normal. Lung fields are clear. He is alert and orientated without any focal neurological signs. Abdomen is soft and nontender.   Investigations: Results for orders placed during the hospital encounter of 03/18/11 (from the past 48 hour(s))  GLUCOSE, CAPILLARY     Status: Abnormal   Collection Time   03/18/11  1:28 PM      Component Value Range Comment   Glucose-Capillary 484 (*) 70 - 99 (mg/dL)    Comment 1 Documented in Chart      Comment 2 Notify RN     CBC     Status: Abnormal   Collection Time   03/18/11  1:51 PM      Component Value Range Comment   WBC 21.2 (*) 4.0 - 10.5 (K/uL)    RBC 4.43  4.22 - 5.81 (MIL/uL)    Hemoglobin 13.5  13.0 - 17.0 (g/dL)    HCT 78.2  95.6 - 21.3 (%)    MCV 91.2  78.0 - 100.0 (fL)    MCH 30.5  26.0 - 34.0 (pg)    MCHC 33.4  30.0 - 36.0 (g/dL)    RDW 08.6  57.8 - 46.9 (%)    Platelets 308  150 - 400 (K/uL)   DIFFERENTIAL     Status: Abnormal   Collection Time   03/18/11  1:51 PM      Component Value Range Comment   Neutrophils Relative 92 (*) 43 - 77 (%)    Neutro Abs 19.4 (*)  1.7 - 7.7 (K/uL)    Lymphocytes Relative 3 (*) 12 - 46 (%)    Lymphs Abs 0.6 (*) 0.7 - 4.0 (K/uL)    Monocytes Relative 5  3 - 12 (%)    Monocytes Absolute 1.1 (*) 0.1 - 1.0 (K/uL)    Eosinophils Relative 0  0 - 5 (%)    Eosinophils Absolute 0.0  0.0 - 0.7 (K/uL)    Basophils Relative 0  0 - 1 (%)    Basophils Absolute 0.0  0.0 - 0.1 (K/uL)   BASIC METABOLIC PANEL     Status: Abnormal   Collection Time   03/18/11  1:51 PM      Component Value Range Comment   Sodium 129 (*) 135 - 145 (mEq/L)    Potassium 5.0  3.5 - 5.1 (mEq/L)    Chloride 97  96 - 112 (mEq/L)    CO2 21  19 - 32 (mEq/L)    Glucose, Bld 492 (*) 70 - 99 (mg/dL)    BUN 46 (*) 6 - 23 (mg/dL)    Creatinine, Ser  1.06  0.50 - 1.35 (mg/dL) ICTERUS AT THIS LEVEL MAY AFFECT RESULT   Calcium 10.1  8.4 - 10.5 (mg/dL)    GFR calc non Af Amer >60  >60 (mL/min)    GFR calc Af Amer >60  >60 (mL/min)   MAGNESIUM     Status: Normal   Collection Time   03/18/11  1:51 PM      Component Value Range Comment   Magnesium 1.8  1.5 - 2.5 (mg/dL)   PHOSPHORUS     Status: Normal   Collection Time   03/18/11  1:51 PM      Component Value Range Comment   Phosphorus 2.4  2.3 - 4.6 (mg/dL)   APTT     Status: Normal   Collection Time   03/18/11  1:51 PM      Component Value Range Comment   aPTT 28  24 - 37 (seconds)   PROTIME-INR     Status: Abnormal   Collection Time   03/18/11  1:51 PM      Component Value Range Comment   Prothrombin Time 15.5 (*) 11.6 - 15.2 (seconds)    INR 1.20  0.00 - 1.49    URINALYSIS, ROUTINE W REFLEX MICROSCOPIC     Status: Abnormal   Collection Time   03/18/11  2:00 PM      Component Value Range Comment   Color, Urine YELLOW  YELLOW     Appearance CLEAR  CLEAR     Specific Gravity, Urine 1.020  1.005 - 1.030     pH 5.0  5.0 - 8.0     Glucose, UA >1000 (*) NEGATIVE (mg/dL)    Hgb urine dipstick TRACE (*) NEGATIVE     Bilirubin Urine MODERATE (*) NEGATIVE     Ketones, ur 15 (*) NEGATIVE (mg/dL)    Protein, ur  NEGATIVE  NEGATIVE (mg/dL)    Urobilinogen, UA 0.2  0.0 - 1.0 (mg/dL)    Nitrite NEGATIVE  NEGATIVE     Leukocytes, UA NEGATIVE  NEGATIVE    URINE MICROSCOPIC-ADD ON     Status: Abnormal   Collection Time   03/18/11  2:00 PM      Component Value Range Comment   Squamous Epithelial / LPF RARE  RARE     WBC, UA 0-2  <3 (WBC/hpf)    RBC / HPF 3-6  <3 (RBC/hpf)    Bacteria, UA FEW (*) RARE    LIPASE, BLOOD     Status: Normal   Collection Time   03/18/11  5:15 PM      Component Value Range Comment   Lipase 17  11 - 59 (U/L)   HEPATIC FUNCTION PANEL     Status: Abnormal   Collection Time   03/18/11  5:15 PM      Component Value Range Comment   Total Protein 6.3  6.0 - 8.3 (g/dL)    Albumin 2.7 (*) 3.5 - 5.2 (g/dL)    AST 782 (*) 0 - 37 (U/L)    ALT 377 (*) 0 - 53 (U/L)    Alkaline Phosphatase 415 (*) 39 - 117 (U/L)    Total Bilirubin 5.3 (*) 0.3 - 1.2 (mg/dL)    Bilirubin, Direct 4.3 (*) 0.0 - 0.3 (mg/dL)    Indirect Bilirubin 1.0 (*) 0.3 - 0.9 (mg/dL)   GLUCOSE, CAPILLARY     Status: Abnormal   Collection Time   03/18/11  6:43 PM      Component Value Range Comment  Glucose-Capillary 245 (*) 70 - 99 (mg/dL)   GLUCOSE, CAPILLARY     Status: Abnormal   Collection Time   03/19/11 12:01 AM      Component Value Range Comment   Glucose-Capillary 296 (*) 70 - 99 (mg/dL)   GLUCOSE, CAPILLARY     Status: Abnormal   Collection Time   03/19/11  4:27 AM      Component Value Range Comment   Glucose-Capillary 312 (*) 70 - 99 (mg/dL)   CBC     Status: Abnormal   Collection Time   03/19/11  5:50 AM      Component Value Range Comment   WBC 19.5 (*) 4.0 - 10.5 (K/uL)    RBC 3.78 (*) 4.22 - 5.81 (MIL/uL)    Hemoglobin 11.7 (*) 13.0 - 17.0 (g/dL)    HCT 54.0 (*) 98.1 - 52.0 (%)    MCV 90.5  78.0 - 100.0 (fL)    MCH 31.0  26.0 - 34.0 (pg)    MCHC 34.2  30.0 - 36.0 (g/dL)    RDW 19.1  47.8 - 29.5 (%)    Platelets 258  150 - 400 (K/uL)   COMPREHENSIVE METABOLIC PANEL     Status: Abnormal    Collection Time   03/19/11  5:50 AM      Component Value Range Comment   Sodium 132 (*) 135 - 145 (mEq/L)    Potassium 4.9  3.5 - 5.1 (mEq/L)    Chloride 105  96 - 112 (mEq/L) DELTA CHECK NOTED   CO2 23  19 - 32 (mEq/L)    Glucose, Bld 292 (*) 70 - 99 (mg/dL)    BUN 33 (*) 6 - 23 (mg/dL)    Creatinine, Ser 6.21  0.50 - 1.35 (mg/dL) ICTERUS AT THIS LEVEL MAY AFFECT RESULT   Calcium 9.1  8.4 - 10.5 (mg/dL)    Total Protein 6.2  6.0 - 8.3 (g/dL)    Albumin 2.4 (*) 3.5 - 5.2 (g/dL)    AST 308 (*) 0 - 37 (U/L)    ALT 295 (*) 0 - 53 (U/L)    Alkaline Phosphatase 357 (*) 39 - 117 (U/L)    Total Bilirubin 6.8 (*) 0.3 - 1.2 (mg/dL)    GFR calc non Af Amer >60  >60 (mL/min)    GFR calc Af Amer >60  >60 (mL/min)   GLUCOSE, CAPILLARY     Status: Abnormal   Collection Time   03/19/11  8:07 AM      Component Value Range Comment   Glucose-Capillary 223 (*) 70 - 99 (mg/dL)    Comment 1 Notify RN      No results found for this or any previous visit (from the past 240 hour(s)).  Dg Chest 2 View  03/18/2011  *RADIOLOGY REPORT*  Clinical Data: Weakness.  CHEST - 2 VIEW  Comparison: Multiple exams, including 09/11/2005  Findings: Chronic mild elevation of the right hemidiaphragm noted.  There is mild tortuosity of the thoracic aorta.  Thoracic spondylosis is present.  The lungs appear clear.  No pleural effusion identified.  IMPRESSION:  1.  Mild chronic elevation of the right hemidiaphragm. 2.  Thoracic spondylosis.  Original Report Authenticated By: Dellia Cloud, M.D.   Mr 3d Recon At Scanner  03/19/2011  *RADIOLOGY REPORT*  Clinical Data:  Liver transplant in 2006.  Elevated liver function tests.  Liver biopsy is scheduled for Wednesday.  MRI ABDOMEN WITHOUT AND WITH CONTRAST (INCLUDING MRCP)  Technique:  Multiplanar multisequence MR  imaging of the abdomen was performed both before and after the administration of intravenous contrast. Heavily T2-weighted images of the biliary and pancreatic ducts  were obtained, and three-dimensional MRCP images were rendered by post processing.  Contrast: 20 ml Multihance  Comparison:    No prior CT or MRI.  Findings:  Mild right hemidiaphragm elevation.  Trace right-sided pleural fluid.  High medial segment left hepatic lobe 9 mm cyst on image 18 of series 4.  Atrophy of the left lobe of the liver, without specific evidence of cirrhosis.  Marked paraesophageal varices, including on image 39 of series 17.  Normal spleen, stomach, adrenal glands.  Bilateral renal cysts.  Moderate intra and extrahepatic biliary ductal dilatation.  The common duct measures 1.6 cm proximally.  Within the proximal common duct is a 2.9 x 1.6 cm T2 hypointense, mildly T1 hyperintense filling defect on image 21 of series 3. More inferiorly positioned filling defects within the common duct measure up to 1.7 x 1.0 cm on image 20 of series 3.  Also T2 hypointense and precontrast T1 hyperintense.  Smaller filling defects also suspected in this area.  No pancreatic ductal dilatation or acute pancreatitis.  Tiny foci of side branch duct ectasia within the pancreas, including images 35 and 39 of series 4.  Gallbladder likely surgically absent with normal appearing cystic duct remnant.  Portal veins and hepatic veins are patent.  The hepatic artery is tortuous, including on images 71 - 75 of series 15.  This could be within the region of the surgical anastomoses.  The more distal vessel is patent.  The liver opacifies normally.  No abdominal adenopathy or ascites.  IMPRESSION: 1.   Biliary ductal dilatation, with numerous common duct filling defects.  Most likely stones.  Given the history of prior liver transplant, and T1 hyperintensity, differential considerations of blood clot or less likely sloughed epithelium should also be considered.  ERCP suggested.  2.  Atrophy of the left lobe of the liver without specific evidence of residual or recurrent cirrhosis. 3.  Trace right-sided pleural fluid. 4.  Marked  tortuosity of the hepatic artery.  Cannot exclude stenosis in the region of the presumed hepatic artery to hepatic artery anastomosis. 5.  Esophageal varices. 6. Side branch pancreatic duct ectasia suggests chronic pancreatitis.  Original Report Authenticated By: Consuello Bossier, M.D.   Mr Jorja Loa Cm/mrcp  03/19/2011  *RADIOLOGY REPORT*  Clinical Data:  Liver transplant in 2006.  Elevated liver function tests.  Liver biopsy is scheduled for Wednesday.  MRI ABDOMEN WITHOUT AND WITH CONTRAST (INCLUDING MRCP)  Technique:  Multiplanar multisequence MR imaging of the abdomen was performed both before and after the administration of intravenous contrast. Heavily T2-weighted images of the biliary and pancreatic ducts were obtained, and three-dimensional MRCP images were rendered by post processing.  Contrast: 20 ml Multihance  Comparison:    No prior CT or MRI.  Findings:  Mild right hemidiaphragm elevation.  Trace right-sided pleural fluid.  High medial segment left hepatic lobe 9 mm cyst on image 18 of series 4.  Atrophy of the left lobe of the liver, without specific evidence of cirrhosis.  Marked paraesophageal varices, including on image 39 of series 17.  Normal spleen, stomach, adrenal glands.  Bilateral renal cysts.  Moderate intra and extrahepatic biliary ductal dilatation.  The common duct measures 1.6 cm proximally.  Within the proximal common duct is a 2.9 x 1.6 cm T2 hypointense, mildly T1 hyperintense filling defect on image 21 of  series 3. More inferiorly positioned filling defects within the common duct measure up to 1.7 x 1.0 cm on image 20 of series 3.  Also T2 hypointense and precontrast T1 hyperintense.  Smaller filling defects also suspected in this area.  No pancreatic ductal dilatation or acute pancreatitis.  Tiny foci of side branch duct ectasia within the pancreas, including images 35 and 39 of series 4.  Gallbladder likely surgically absent with normal appearing cystic duct remnant.  Portal veins  and hepatic veins are patent.  The hepatic artery is tortuous, including on images 71 - 75 of series 15.  This could be within the region of the surgical anastomoses.  The more distal vessel is patent.  The liver opacifies normally.  No abdominal adenopathy or ascites.  IMPRESSION: 1.   Biliary ductal dilatation, with numerous common duct filling defects.  Most likely stones.  Given the history of prior liver transplant, and T1 hyperintensity, differential considerations of blood clot or less likely sloughed epithelium should also be considered.  ERCP suggested.  2.  Atrophy of the left lobe of the liver without specific evidence of residual or recurrent cirrhosis. 3.  Trace right-sided pleural fluid. 4.  Marked tortuosity of the hepatic artery.  Cannot exclude stenosis in the region of the presumed hepatic artery to hepatic artery anastomosis. 5.  Esophageal varices. 6. Side branch pancreatic duct ectasia suggests chronic pancreatitis.  Original Report Authenticated By: Consuello Bossier, M.D.      Medications: I have reviewed the patient's current medications.  Impression: 1. Uncontrolled diabetes mellitus type 2. 2. Hypertension. 3. Abnormal liver enzyme /function.     Plan: 1. Continue current treatment. 2. Endocrinology consultation. 3. Possible discharge home tomorrow.     LOS: 1 day   Shanaia Sievers C 03/19/2011, 11:49 AM

## 2011-03-19 NOTE — Progress Notes (Signed)
Cc to PCP 

## 2011-03-19 NOTE — Progress Notes (Signed)
Notified Dr. Fransico Him to let him know of the new consult on this pt.  Discussed with him the pts recent hgb A1C of 10, and gave him som history of the pt such as with his liver transplant and of a generalized rash with itching.  Also discussed pt/wife's concern of a rash that they thought  Was related to a lantus allergy and also of a possible reaction between novolog and trigenta diabetic medication.  MD states that Lantus is probably not the cause of the rash.  MD states he will be in in the am to see the pt.  Voiced to conversation to the pt/wife and they verbalized understanding.  New orders given and followed.

## 2011-03-20 ENCOUNTER — Encounter: Payer: Self-pay | Admitting: Gastroenterology

## 2011-03-20 LAB — COMPREHENSIVE METABOLIC PANEL
AST: 96 U/L — ABNORMAL HIGH (ref 0–37)
Albumin: 2.5 g/dL — ABNORMAL LOW (ref 3.5–5.2)
Alkaline Phosphatase: 347 U/L — ABNORMAL HIGH (ref 39–117)
Chloride: 107 mEq/L (ref 96–112)
Potassium: 3.9 mEq/L (ref 3.5–5.1)
Total Bilirubin: 6.5 mg/dL — ABNORMAL HIGH (ref 0.3–1.2)

## 2011-03-20 LAB — TSH: TSH: 1.714 u[IU]/mL (ref 0.350–4.500)

## 2011-03-20 LAB — HEMOGLOBIN A1C
Hgb A1c MFr Bld: 9.6 % — ABNORMAL HIGH (ref <5.7)
Mean Plasma Glucose: 229 mg/dL — ABNORMAL HIGH (ref <117)

## 2011-03-20 LAB — GLUCOSE, CAPILLARY: Glucose-Capillary: 98 mg/dL (ref 70–99)

## 2011-03-20 LAB — CBC
Platelets: 242 10*3/uL (ref 150–400)
RDW: 15.4 % (ref 11.5–15.5)
WBC: 11.4 10*3/uL — ABNORMAL HIGH (ref 4.0–10.5)

## 2011-03-20 LAB — PROTIME-INR: INR: 1.34 (ref 0.00–1.49)

## 2011-03-20 MED ORDER — INSULIN ASPART PROT & ASPART (70-30 MIX) 100 UNIT/ML ~~LOC~~ SUSP: 10.0000 [IU] | Freq: Two times a day (BID) | SUBCUTANEOUS | Status: DC

## 2011-03-20 MED ORDER — INSULIN ASPART 100 UNIT/ML ~~LOC~~ SOLN: 10.0000 [IU] | Freq: Three times a day (TID) | SUBCUTANEOUS | Status: DC

## 2011-03-20 MED ORDER — LINAGLIPTIN 5 MG PO TABS: 5.0000 mg | ORAL_TABLET | Freq: Every day | ORAL | Status: DC

## 2011-03-20 MED ORDER — HYDROXYZINE HCL 25 MG PO TABS: 25.0000 mg | ORAL_TABLET | Freq: Three times a day (TID) | ORAL | Status: AC | PRN

## 2011-03-20 NOTE — Progress Notes (Signed)
I spoke with Almira Coaster in scheduling at [facility] and she cx liver bx.

## 2011-03-20 NOTE — Discharge Summary (Signed)
Physician Discharge Summary  Patient ID: [name] MRN: [id number] DOB/AGE: [birth year] 70 y.o. Primary Care Physician:FUSCO,LAWRENCE J., MD Admit date: 03/18/2011 Discharge date: 03/20/2011    Discharge Diagnoses:  1. Uncontrolled type 2 diabetes mellitus, improving. 2. Abnormal liver enzymes status post liver transplant, status post MRCP. 3. Urticarial rash, unclear etiology. 4. Hypertension.   Current Discharge Medication List    START taking these medications   Details  hydrOXYzine (ATARAX) 25 MG tablet Take 1 tablet (25 mg total) by mouth 3 (three) times daily as needed for itching. Qty: 30 tablet, Refills: 0    insulin aspart (NOVOLOG) 100 UNIT/ML injection Inject 10 Units into the skin 3 (three) times daily with meals. Please provide a pen. Qty: 10 mL, Refills: 6    insulin aspart protamine-insulin aspart (NOVOLOG 70/30) (70-30) 100 UNIT/ML injection Inject 10 Units into the skin 2 (two) times daily with a meal. Please provide a pen if possible. Qty: 10 mL, Refills: 6    linagliptin (TRADJENTA) 5 MG TABS tablet Take 1 tablet (5 mg total) by mouth daily. Qty: 30 tablet, Refills: 0      CONTINUE these medications which have NOT CHANGED   Details  aspirin 81 MG tablet Take 81 mg by mouth daily.      docusate sodium (EQUATE STOOL SOFTENER) 100 MG capsule Take 100 mg by mouth daily as needed. For occasional constipation     levothyroxine (SYNTHROID, LEVOTHROID) 100 MCG tablet Take 100 mcg by mouth daily.     lisinopril (PRINIVIL,ZESTRIL) 20 MG tablet Take 20 mg by mouth daily.     magnesium 30 MG tablet Take 30 mg by mouth 2 (two) times daily. With protein     Multiple Vitamin (MULTIVITAMIN) capsule Take 1 capsule by mouth daily.      Multiple Vitamins-Minerals (MULTIVITAMIN WITH MINERALS) tablet Take 1 tablet by mouth daily.      mycophenolate (CELLCEPT) 250 MG capsule Take 500 mg by mouth 2 (two) times daily.     predniSONE (STERAPRED UNI-PAK) 5 MG TABS Take 5  mg by mouth Once daily as needed.    Specialty Vitamins Products (MAGNESIUM, AMINO ACID CHELATE,) 133 MG tablet Take 1 tablet by mouth 2 (two) times daily. Magnesium 133mg      tacrolimus (PROGRAF) 1 MG capsule Take 1-2 mg by mouth 2 (two) times daily. Takes 2 pills every morning and 1 pill at supper      STOP taking these medications     glimepiride (AMARYL) 4 MG tablet      saxagliptin HCl (ONGLYZA) 5 MG TABS tablet         Discharged Condition: Stable and improved.    Consults: Endocrinology, Dr. Fransico Him  Significant Diagnostic Studies: Dg Chest 2 View  03/18/2011  *RADIOLOGY REPORT*  Clinical Data: Weakness.  CHEST - 2 VIEW  Comparison: Multiple exams, including 09/11/2005  Findings: Chronic mild elevation of the right hemidiaphragm noted.  There is mild tortuosity of the thoracic aorta.  Thoracic spondylosis is present.  The lungs appear clear.  No pleural effusion identified.  IMPRESSION:  1.  Mild chronic elevation of the right hemidiaphragm. 2.  Thoracic spondylosis.  Original Report Authenticated By: Dellia Cloud, M.D.   Mr 3d Recon At Scanner  03/19/2011  *RADIOLOGY REPORT*  Clinical Data:  Liver transplant in 2006.  Elevated liver function tests.  Liver biopsy is scheduled for Wednesday.  MRI ABDOMEN WITHOUT AND WITH CONTRAST (INCLUDING MRCP)  Technique:  Multiplanar multisequence MR imaging of the abdomen  was performed both before and after the administration of intravenous contrast. Heavily T2-weighted images of the biliary and pancreatic ducts were obtained, and three-dimensional MRCP images were rendered by post processing.  Contrast: 20 ml Multihance  Comparison:    No prior CT or MRI.  Findings:  Mild right hemidiaphragm elevation.  Trace right-sided pleural fluid.  High medial segment left hepatic lobe 9 mm cyst on image 18 of series 4.  Atrophy of the left lobe of the liver, without specific evidence of cirrhosis.  Marked paraesophageal varices, including on image 39  of series 17.  Normal spleen, stomach, adrenal glands.  Bilateral renal cysts.  Moderate intra and extrahepatic biliary ductal dilatation.  The common duct measures 1.6 cm proximally.  Within the proximal common duct is a 2.9 x 1.6 cm T2 hypointense, mildly T1 hyperintense filling defect on image 21 of series 3. More inferiorly positioned filling defects within the common duct measure up to 1.7 x 1.0 cm on image 20 of series 3.  Also T2 hypointense and precontrast T1 hyperintense.  Smaller filling defects also suspected in this area.  No pancreatic ductal dilatation or acute pancreatitis.  Tiny foci of side branch duct ectasia within the pancreas, including images 35 and 39 of series 4.  Gallbladder likely surgically absent with normal appearing cystic duct remnant.  Portal veins and hepatic veins are patent.  The hepatic artery is tortuous, including on images 71 - 75 of series 15.  This could be within the region of the surgical anastomoses.  The more distal vessel is patent.  The liver opacifies normally.  No abdominal adenopathy or ascites.  IMPRESSION: 1.   Biliary ductal dilatation, with numerous common duct filling defects.  Most likely stones.  Given the history of prior liver transplant, and T1 hyperintensity, differential considerations of blood clot or less likely sloughed epithelium should also be considered.  ERCP suggested.  2.  Atrophy of the left lobe of the liver without specific evidence of residual or recurrent cirrhosis. 3.  Trace right-sided pleural fluid. 4.  Marked tortuosity of the hepatic artery.  Cannot exclude stenosis in the region of the presumed hepatic artery to hepatic artery anastomosis. 5.  Esophageal varices. 6. Side branch pancreatic duct ectasia suggests chronic pancreatitis.  Original Report Authenticated By: Consuello Bossier, M.D.   Mr Jorja Loa Cm/mrcp  03/19/2011  *RADIOLOGY REPORT*  Clinical Data:  Liver transplant in 2006.  Elevated liver function tests.  Liver biopsy is  scheduled for Wednesday.  MRI ABDOMEN WITHOUT AND WITH CONTRAST (INCLUDING MRCP)  Technique:  Multiplanar multisequence MR imaging of the abdomen was performed both before and after the administration of intravenous contrast. Heavily T2-weighted images of the biliary and pancreatic ducts were obtained, and three-dimensional MRCP images were rendered by post processing.  Contrast: 20 ml Multihance  Comparison:    No prior CT or MRI.  Findings:  Mild right hemidiaphragm elevation.  Trace right-sided pleural fluid.  High medial segment left hepatic lobe 9 mm cyst on image 18 of series 4.  Atrophy of the left lobe of the liver, without specific evidence of cirrhosis.  Marked paraesophageal varices, including on image 39 of series 17.  Normal spleen, stomach, adrenal glands.  Bilateral renal cysts.  Moderate intra and extrahepatic biliary ductal dilatation.  The common duct measures 1.6 cm proximally.  Within the proximal common duct is a 2.9 x 1.6 cm T2 hypointense, mildly T1 hyperintense filling defect on image 21 of series 3. More inferiorly  positioned filling defects within the common duct measure up to 1.7 x 1.0 cm on image 20 of series 3.  Also T2 hypointense and precontrast T1 hyperintense.  Smaller filling defects also suspected in this area.  No pancreatic ductal dilatation or acute pancreatitis.  Tiny foci of side branch duct ectasia within the pancreas, including images 35 and 39 of series 4.  Gallbladder likely surgically absent with normal appearing cystic duct remnant.  Portal veins and hepatic veins are patent.  The hepatic artery is tortuous, including on images 71 - 75 of series 15.  This could be within the region of the surgical anastomoses.  The more distal vessel is patent.  The liver opacifies normally.  No abdominal adenopathy or ascites.  IMPRESSION: 1.   Biliary ductal dilatation, with numerous common duct filling defects.  Most likely stones.  Given the history of prior liver transplant, and T1  hyperintensity, differential considerations of blood clot or less likely sloughed epithelium should also be considered.  ERCP suggested.  2.  Atrophy of the left lobe of the liver without specific evidence of residual or recurrent cirrhosis. 3.  Trace right-sided pleural fluid. 4.  Marked tortuosity of the hepatic artery.  Cannot exclude stenosis in the region of the presumed hepatic artery to hepatic artery anastomosis. 5.  Esophageal varices. 6. Side branch pancreatic duct ectasia suggests chronic pancreatitis.  Original Report Authenticated By: Consuello Bossier, M.D.    Lab Results: Results for orders placed during the hospital encounter of 03/18/11 (from the past 48 hour(s))  GLUCOSE, CAPILLARY     Status: Abnormal   Collection Time   03/18/11  1:28 PM      Component Value Range Comment   Glucose-Capillary 484 (*) 70 - 99 (mg/dL)    Comment 1 Documented in Chart      Comment 2 Notify RN     CBC     Status: Abnormal   Collection Time   03/18/11  1:51 PM      Component Value Range Comment   WBC 21.2 (*) 4.0 - 10.5 (K/uL)    RBC 4.43  4.22 - 5.81 (MIL/uL)    Hemoglobin 13.5  13.0 - 17.0 (g/dL)    HCT 16.1  09.6 - 04.5 (%)    MCV 91.2  78.0 - 100.0 (fL)    MCH 30.5  26.0 - 34.0 (pg)    MCHC 33.4  30.0 - 36.0 (g/dL)    RDW 40.9  81.1 - 91.4 (%)    Platelets 308  150 - 400 (K/uL)   DIFFERENTIAL     Status: Abnormal   Collection Time   03/18/11  1:51 PM      Component Value Range Comment   Neutrophils Relative 92 (*) 43 - 77 (%)    Neutro Abs 19.4 (*) 1.7 - 7.7 (K/uL)    Lymphocytes Relative 3 (*) 12 - 46 (%)    Lymphs Abs 0.6 (*) 0.7 - 4.0 (K/uL)    Monocytes Relative 5  3 - 12 (%)    Monocytes Absolute 1.1 (*) 0.1 - 1.0 (K/uL)    Eosinophils Relative 0  0 - 5 (%)    Eosinophils Absolute 0.0  0.0 - 0.7 (K/uL)    Basophils Relative 0  0 - 1 (%)    Basophils Absolute 0.0  0.0 - 0.1 (K/uL)   BASIC METABOLIC PANEL     Status: Abnormal   Collection Time   03/18/11  1:51 PM  Component  Value Range Comment   Sodium 129 (*) 135 - 145 (mEq/L)    Potassium 5.0  3.5 - 5.1 (mEq/L)    Chloride 97  96 - 112 (mEq/L)    CO2 21  19 - 32 (mEq/L)    Glucose, Bld 492 (*) 70 - 99 (mg/dL)    BUN 46 (*) 6 - 23 (mg/dL)    Creatinine, Ser 1.61  0.50 - 1.35 (mg/dL) ICTERUS AT THIS LEVEL MAY AFFECT RESULT   Calcium 10.1  8.4 - 10.5 (mg/dL)    GFR calc non Af Amer >60  >60 (mL/min)    GFR calc Af Amer >60  >60 (mL/min)   MAGNESIUM     Status: Normal   Collection Time   03/18/11  1:51 PM      Component Value Range Comment   Magnesium 1.8  1.5 - 2.5 (mg/dL)   PHOSPHORUS     Status: Normal   Collection Time   03/18/11  1:51 PM      Component Value Range Comment   Phosphorus 2.4  2.3 - 4.6 (mg/dL)   APTT     Status: Normal   Collection Time   03/18/11  1:51 PM      Component Value Range Comment   aPTT 28  24 - 37 (seconds)   PROTIME-INR     Status: Abnormal   Collection Time   03/18/11  1:51 PM      Component Value Range Comment   Prothrombin Time 15.5 (*) 11.6 - 15.2 (seconds)    INR 1.20  0.00 - 1.49    TSH     Status: Normal   Collection Time   03/18/11  1:51 PM      Component Value Range Comment   TSH 2.424  0.350 - 4.500 (uIU/mL)   HEMOGLOBIN A1C     Status: Abnormal   Collection Time   03/18/11  1:51 PM      Component Value Range Comment   Hemoglobin A1C 10.0 (*) <5.7 (%)    Mean Plasma Glucose 240 (*) <117 (mg/dL)   URINALYSIS, ROUTINE W REFLEX MICROSCOPIC     Status: Abnormal   Collection Time   03/18/11  2:00 PM      Component Value Range Comment   Color, Urine YELLOW  YELLOW     Appearance CLEAR  CLEAR     Specific Gravity, Urine 1.020  1.005 - 1.030     pH 5.0  5.0 - 8.0     Glucose, UA >1000 (*) NEGATIVE (mg/dL)    Hgb urine dipstick TRACE (*) NEGATIVE     Bilirubin Urine MODERATE (*) NEGATIVE     Ketones, ur 15 (*) NEGATIVE (mg/dL)    Protein, ur NEGATIVE  NEGATIVE (mg/dL)    Urobilinogen, UA 0.2  0.0 - 1.0 (mg/dL)    Nitrite NEGATIVE  NEGATIVE     Leukocytes, UA  NEGATIVE  NEGATIVE    URINE MICROSCOPIC-ADD ON     Status: Abnormal   Collection Time   03/18/11  2:00 PM      Component Value Range Comment   Squamous Epithelial / LPF RARE  RARE     WBC, UA 0-2  <3 (WBC/hpf)    RBC / HPF 3-6  <3 (RBC/hpf)    Bacteria, UA FEW (*) RARE    LIPASE, BLOOD     Status: Normal   Collection Time   03/18/11  5:15 PM      Component Value Range Comment  Lipase 17  11 - 59 (U/L)   HEPATIC FUNCTION PANEL     Status: Abnormal   Collection Time   03/18/11  5:15 PM      Component Value Range Comment   Total Protein 6.3  6.0 - 8.3 (g/dL)    Albumin 2.7 (*) 3.5 - 5.2 (g/dL)    AST 295 (*) 0 - 37 (U/L)    ALT 377 (*) 0 - 53 (U/L)    Alkaline Phosphatase 415 (*) 39 - 117 (U/L)    Total Bilirubin 5.3 (*) 0.3 - 1.2 (mg/dL)    Bilirubin, Direct 4.3 (*) 0.0 - 0.3 (mg/dL)    Indirect Bilirubin 1.0 (*) 0.3 - 0.9 (mg/dL)   GLUCOSE, CAPILLARY     Status: Abnormal   Collection Time   03/18/11  6:43 PM      Component Value Range Comment   Glucose-Capillary 245 (*) 70 - 99 (mg/dL)   GLUCOSE, CAPILLARY     Status: Abnormal   Collection Time   03/19/11 12:01 AM      Component Value Range Comment   Glucose-Capillary 296 (*) 70 - 99 (mg/dL)   GLUCOSE, CAPILLARY     Status: Abnormal   Collection Time   03/19/11  4:27 AM      Component Value Range Comment   Glucose-Capillary 312 (*) 70 - 99 (mg/dL)   CBC     Status: Abnormal   Collection Time   03/19/11  5:50 AM      Component Value Range Comment   WBC 19.5 (*) 4.0 - 10.5 (K/uL)    RBC 3.78 (*) 4.22 - 5.81 (MIL/uL)    Hemoglobin 11.7 (*) 13.0 - 17.0 (g/dL)    HCT 18.8 (*) 41.6 - 52.0 (%)    MCV 90.5  78.0 - 100.0 (fL)    MCH 31.0  26.0 - 34.0 (pg)    MCHC 34.2  30.0 - 36.0 (g/dL)    RDW 60.6  30.1 - 60.1 (%)    Platelets 258  150 - 400 (K/uL)   COMPREHENSIVE METABOLIC PANEL     Status: Abnormal   Collection Time   03/19/11  5:50 AM      Component Value Range Comment   Sodium 132 (*) 135 - 145 (mEq/L)    Potassium 4.9   3.5 - 5.1 (mEq/L)    Chloride 105  96 - 112 (mEq/L) DELTA CHECK NOTED   CO2 23  19 - 32 (mEq/L)    Glucose, Bld 292 (*) 70 - 99 (mg/dL)    BUN 33 (*) 6 - 23 (mg/dL)    Creatinine, Ser 0.93  0.50 - 1.35 (mg/dL) ICTERUS AT THIS LEVEL MAY AFFECT RESULT   Calcium 9.1  8.4 - 10.5 (mg/dL)    Total Protein 6.2  6.0 - 8.3 (g/dL)    Albumin 2.4 (*) 3.5 - 5.2 (g/dL)    AST 235 (*) 0 - 37 (U/L)    ALT 295 (*) 0 - 53 (U/L)    Alkaline Phosphatase 357 (*) 39 - 117 (U/L)    Total Bilirubin 6.8 (*) 0.3 - 1.2 (mg/dL)    GFR calc non Af Amer >60  >60 (mL/min)    GFR calc Af Amer >60  >60 (mL/min)   GLUCOSE, CAPILLARY     Status: Abnormal   Collection Time   03/19/11  8:07 AM      Component Value Range Comment   Glucose-Capillary 223 (*) 70 - 99 (mg/dL)    Comment 1  Notify RN     GLUCOSE, CAPILLARY     Status: Abnormal   Collection Time   03/19/11 12:12 PM      Component Value Range Comment   Glucose-Capillary 210 (*) 70 - 99 (mg/dL)    Comment 1 Notify RN     GLUCOSE, CAPILLARY     Status: Abnormal   Collection Time   03/19/11  5:52 PM      Component Value Range Comment   Glucose-Capillary 238 (*) 70 - 99 (mg/dL)   GLUCOSE, CAPILLARY     Status: Abnormal   Collection Time   03/19/11  8:17 PM      Component Value Range Comment   Glucose-Capillary 244 (*) 70 - 99 (mg/dL)   GLUCOSE, CAPILLARY     Status: Abnormal   Collection Time   03/19/11 11:36 PM      Component Value Range Comment   Glucose-Capillary 215 (*) 70 - 99 (mg/dL)   CBC     Status: Abnormal   Collection Time   03/20/11  5:56 AM      Component Value Range Comment   WBC 11.4 (*) 4.0 - 10.5 (K/uL)    RBC 3.85 (*) 4.22 - 5.81 (MIL/uL)    Hemoglobin 11.7 (*) 13.0 - 17.0 (g/dL)    HCT 19.1 (*) 47.8 - 52.0 (%)    MCV 91.7  78.0 - 100.0 (fL)    MCH 30.4  26.0 - 34.0 (pg)    MCHC 33.1  30.0 - 36.0 (g/dL)    RDW 29.5  62.1 - 30.8 (%)    Platelets 242  150 - 400 (K/uL)   COMPREHENSIVE METABOLIC PANEL     Status: Abnormal   Collection  Time   03/20/11  5:56 AM      Component Value Range Comment   Sodium 136  135 - 145 (mEq/L)    Potassium 3.9  3.5 - 5.1 (mEq/L) DELTA CHECK NOTED   Chloride 107  96 - 112 (mEq/L)    CO2 20  19 - 32 (mEq/L)    Glucose, Bld 195 (*) 70 - 99 (mg/dL)    BUN 30 (*) 6 - 23 (mg/dL)    Creatinine, Ser 6.57  0.50 - 1.35 (mg/dL) ICTERUS AT THIS LEVEL MAY AFFECT RESULT   Calcium 9.2  8.4 - 10.5 (mg/dL)    Total Protein 6.3  6.0 - 8.3 (g/dL)    Albumin 2.5 (*) 3.5 - 5.2 (g/dL)    AST 96 (*) 0 - 37 (U/L)    ALT 216 (*) 0 - 53 (U/L)    Alkaline Phosphatase 347 (*) 39 - 117 (U/L)    Total Bilirubin 6.5 (*) 0.3 - 1.2 (mg/dL)    GFR calc non Af Amer >60  >60 (mL/min)    GFR calc Af Amer >60  >60 (mL/min)   PROTIME-INR     Status: Abnormal   Collection Time   03/20/11  5:56 AM      Component Value Range Comment   Prothrombin Time 16.8 (*) 11.6 - 15.2 (seconds)    INR 1.34  0.00 - 1.49    GLUCOSE, CAPILLARY     Status: Abnormal   Collection Time   03/20/11  7:44 AM      Component Value Range Comment   Glucose-Capillary 186 (*) 70 - 99 (mg/dL)   GLUCOSE, CAPILLARY     Status: Normal   Collection Time   03/20/11 11:23 AM      Component Value Range Comment  Glucose-Capillary 82  70 - 99 (mg/dL)    No results found for this or any previous visit (from the past 240 hour(s)).   Hospital Course: This 70 year old man was admitted with uncontrolled diabetes. Please see initial history and physical examination done by Dr. Vedia Coffer. The patient was seen by Dr. Fransico Him who has adjusted his insulin and his sugars are much improved. During hospitalization, he was never in diabetic ketoacidosis or hyperosmolar state. He feels much improved since his sugars are better controlled. During hospitalization he also had MRCP related to his liver issues. He will followup with a liver biopsy tomorrow which she was already due to have.  Discharge Exam: Blood pressure 131/76, pulse 98, temperature 97.7 F (36.5 C),  temperature source Oral, resp. rate 20, height 5\' 9"  (1.753 m), weight 96.253 kg (212 lb 3.2 oz), SpO2 97.00%. He looks systemically well. Heart sounds are present and normal. Lung fields are clear abdomen is soft nontender. He is alert and orientated without any focal neurological signs.  Disposition: Home.  Discharge Orders    Future Appointments: Provider: Department: Dept Phone: Center:   03/21/2011 10:00 AM [facility]     Future Orders Please Complete By Expires   Diet - low sodium heart healthy      Increase activity slowly         Follow-up Information    Follow up with NIDA,GEBRESELASSIE on 03/27/2011. (11:00am)    Contact information:   [address] [place] [address] [phone]          Signed: Wilson Singer 03/20/2011, 11:45 AM

## 2011-03-20 NOTE — Progress Notes (Unsigned)
  Pt inpatient currently but has been discharged home. Awaiting ride. Spoke with Dr. Jena Gauss, and we will proceed with ERCP tomorrow due to findings on MRCP. It is fine for patient to go home overnight and return in the morning. Dr. Karilyn Cota notified as well.  The liver biopsy that was scheduled for tomorrow will be cancelled by our office.

## 2011-03-20 NOTE — Progress Notes (Signed)
D/c instructions reviewed with patient and wife. Verbalized understanding. Pt waiting for daughter to pick up for d/c to home with wife.  [place], [name] 1500

## 2011-03-20 NOTE — Progress Notes (Signed)
  Pt inpatient currently but has been discharged home. Awaiting ride. Spoke with Dr. Jena Gauss, and we will proceed with ERCP tomorrow due to findings on MRCP. It is fine for patient to go home overnight and return in the morning. Dr. Karilyn Cota notified as well. The liver biopsy that was scheduled for tomorrow will be cancelled by our office.   As outlined above. I have reviewed the MRCP with Dr. Tyron Russell. Findings are compelling for multiple common duct stones with a dilated bile duct. No stricture. Some atrophy of the left lobe of the liver.  A meandering hepatic artery -  no definite stenosis and no space-occupying lesion in the liver.  Common duct stones could easily  explain his recent bump in LFTs and his recent bout of "gastroenteritis". We'll hold off liver biopsy scheduled for tomorrow. The patient needs an ERCP with sphincterotomy and stone extraction. I discussed the scenario with the transplant coordinator, Steward Drone, at the [facility] [place] . She asked that we take care of ERCP down here.  I have offered [name] an ERCP with sphincterotomy and stone extraction tomorrow morning. I reviewed the risks benefits, limitations, alternatives and imponderables with [name] at the bedside this afternoon. I specifically talked about a 1 in 10 chance of pancreatitis, potential need for stent placement (which would necessitate a subsequent procedure) and the potential for failed procedure. They  Understand if lfts do not subsequently normalized after stones removed, he may still need a liver biopsy and further evaluation. Questions have been answered. They very much want to proceed tomorrow.

## 2011-03-20 NOTE — Consult Note (Signed)
Duplicate note , erase please.

## 2011-03-21 ENCOUNTER — Other Ambulatory Visit (HOSPITAL_COMMUNITY): Payer: Self-pay

## 2011-03-21 ENCOUNTER — Ambulatory Visit (HOSPITAL_COMMUNITY): Payer: Medicare HMO

## 2011-03-21 ENCOUNTER — Encounter (HOSPITAL_COMMUNITY): Payer: Self-pay

## 2011-03-21 ENCOUNTER — Other Ambulatory Visit: Payer: Self-pay | Admitting: Internal Medicine

## 2011-03-21 ENCOUNTER — Encounter (HOSPITAL_COMMUNITY): Payer: Self-pay | Admitting: Anesthesiology

## 2011-03-21 ENCOUNTER — Ambulatory Visit (HOSPITAL_COMMUNITY)
Admission: RE | Admit: 2011-03-21 | Discharge: 2011-03-21 | Disposition: A | Discharge status: Home / Self Care | Payer: Medicare HMO | Source: Ambulatory Visit | Attending: Internal Medicine | Admitting: Internal Medicine

## 2011-03-21 ENCOUNTER — Encounter (HOSPITAL_COMMUNITY)
Admission: RE | Disposition: A | Discharge status: Home / Self Care | Payer: Self-pay | Source: Ambulatory Visit | Attending: Internal Medicine

## 2011-03-21 ENCOUNTER — Other Ambulatory Visit: Payer: Self-pay

## 2011-03-21 ENCOUNTER — Ambulatory Visit (HOSPITAL_COMMUNITY): Payer: Medicare HMO | Admitting: Anesthesiology

## 2011-03-21 DIAGNOSIS — K8051 Calculus of bile duct without cholangitis or cholecystitis with obstruction: Secondary | ICD-10-CM

## 2011-03-21 DIAGNOSIS — Z944 Liver transplant status: Secondary | ICD-10-CM | POA: Insufficient documentation

## 2011-03-21 DIAGNOSIS — Z9889 Other specified postprocedural states: Secondary | ICD-10-CM

## 2011-03-21 DIAGNOSIS — Z01812 Encounter for preprocedural laboratory examination: Secondary | ICD-10-CM | POA: Insufficient documentation

## 2011-03-21 DIAGNOSIS — T85698A Other mechanical complication of other specified internal prosthetic devices, implants and grafts, initial encounter: Secondary | ICD-10-CM | POA: Insufficient documentation

## 2011-03-21 DIAGNOSIS — R748 Abnormal levels of other serum enzymes: Secondary | ICD-10-CM | POA: Insufficient documentation

## 2011-03-21 DIAGNOSIS — E119 Type 2 diabetes mellitus without complications: Secondary | ICD-10-CM | POA: Insufficient documentation

## 2011-03-21 HISTORY — DX: Other specified postprocedural states: Z98.890

## 2011-03-21 HISTORY — PX: SPHINCTEROTOMY: SHX5544

## 2011-03-21 HISTORY — PX: ERCP: SHX5425

## 2011-03-21 HISTORY — PX: BILE DUCT STENT PLACEMENT: SHX1227

## 2011-03-21 SURGERY — ENDOSCOPIC RETROGRADE CHOLANGIOPANCREATOGRAPHY (ERCP)
Anesthesia: General

## 2011-03-21 MED ORDER — FENTANYL CITRATE 0.05 MG/ML IJ SOLN
INTRAMUSCULAR | Status: AC
  Filled 2011-03-21: qty 2

## 2011-03-21 MED ORDER — SUCCINYLCHOLINE CHLORIDE 20 MG/ML IJ SOLN
INTRAMUSCULAR | Status: DC | PRN
  Administered 2011-03-21: 110 mg via INTRAVENOUS

## 2011-03-21 MED ORDER — PROPOFOL 10 MG/ML IV EMUL
INTRAVENOUS | Status: AC
  Administered 2011-03-21: 120 mg via INTRAVENOUS
  Filled 2011-03-21: qty 20

## 2011-03-21 MED ORDER — EPHEDRINE SULFATE 50 MG/ML IJ SOLN
INTRAMUSCULAR | Status: DC | PRN
  Administered 2011-03-21 (×3): 10 mg via INTRAVENOUS

## 2011-03-21 MED ORDER — SODIUM CHLORIDE 0.9 % IJ SOLN
INTRAMUSCULAR | Status: DC | PRN
  Administered 2011-03-21: 50 mL

## 2011-03-21 MED ORDER — PROPOFOL 10 MG/ML IV EMUL
INTRAVENOUS | Status: AC
  Filled 2011-03-21: qty 20

## 2011-03-21 MED ORDER — FENTANYL CITRATE 0.05 MG/ML IJ SOLN
INTRAMUSCULAR | Status: DC | PRN
  Administered 2011-03-21 (×2): 50 ug via INTRAVENOUS

## 2011-03-21 MED ORDER — LACTATED RINGERS IV SOLN
INTRAVENOUS | Status: DC
  Administered 2011-03-21: 08:00:00 via INTRAVENOUS

## 2011-03-21 MED ORDER — GLYCOPYRROLATE 0.2 MG/ML IJ SOLN
0.2000 mg | Freq: Once | INTRAMUSCULAR | Status: AC
  Administered 2011-03-21: 0.2 mg via INTRAVENOUS

## 2011-03-21 MED ORDER — SODIUM CHLORIDE 0.9 % IV SOLN
INTRAVENOUS | Status: AC
  Filled 2011-03-21: qty 100

## 2011-03-21 MED ORDER — LIDOCAINE HCL (PF) 1 % IJ SOLN
INTRAMUSCULAR | Status: AC
  Filled 2011-03-21: qty 5

## 2011-03-21 MED ORDER — FENTANYL CITRATE 0.05 MG/ML IJ SOLN: 25.0000 ug | INTRAMUSCULAR | Status: DC | PRN

## 2011-03-21 MED ORDER — MIDAZOLAM HCL 2 MG/2ML IJ SOLN
INTRAMUSCULAR | Status: AC
  Administered 2011-03-21: 2 mg via INTRAVENOUS
  Filled 2011-03-21: qty 2

## 2011-03-21 MED ORDER — ONDANSETRON HCL 4 MG/2ML IJ SOLN
4.0000 mg | Freq: Once | INTRAMUSCULAR | Status: AC
  Administered 2011-03-21: 4 mg via INTRAVENOUS

## 2011-03-21 MED ORDER — EPHEDRINE SULFATE 50 MG/ML IJ SOLN
INTRAMUSCULAR | Status: AC
  Filled 2011-03-21: qty 1

## 2011-03-21 MED ORDER — LEVOFLOXACIN IN D5W 250 MG/50ML IV SOLN
INTRAVENOUS | Status: AC
  Administered 2011-03-21: 250 mg via INTRAVENOUS
  Filled 2011-03-21: qty 50

## 2011-03-21 MED ORDER — ROCURONIUM BROMIDE 100 MG/10ML IV SOLN
INTRAVENOUS | Status: DC | PRN
  Administered 2011-03-21: 5 mg via INTRAVENOUS

## 2011-03-21 MED ORDER — LIDOCAINE HCL (PF) 0.5 % IJ SOLN
INTRAMUSCULAR | Status: AC
  Filled 2011-03-21: qty 50

## 2011-03-21 MED ORDER — SIMETHICONE 40 MG/0.6ML PO SUSP
ORAL | Status: DC | PRN
  Administered 2011-03-21: 10:00:00

## 2011-03-21 MED ORDER — LACTATED RINGERS IV SOLN: INTRAVENOUS | Status: DC

## 2011-03-21 MED ORDER — SUCCINYLCHOLINE CHLORIDE 20 MG/ML IJ SOLN
INTRAMUSCULAR | Status: AC
  Filled 2011-03-21: qty 1

## 2011-03-21 MED ORDER — GLYCOPYRROLATE 0.2 MG/ML IJ SOLN
INTRAMUSCULAR | Status: AC
  Filled 2011-03-21: qty 1

## 2011-03-21 MED ORDER — ROCURONIUM BROMIDE 50 MG/5ML IV SOLN
INTRAVENOUS | Status: AC
  Filled 2011-03-21: qty 1

## 2011-03-21 MED ORDER — CIPROFLOXACIN HCL 500 MG PO TABS: 500.0000 mg | ORAL_TABLET | Freq: Two times a day (BID) | ORAL | Status: AC

## 2011-03-21 MED ORDER — LEVOFLOXACIN IN D5W 250 MG/50ML IV SOLN: 250.0000 mg | Freq: Once | INTRAVENOUS | Status: DC

## 2011-03-21 MED ORDER — ONDANSETRON HCL 4 MG/2ML IJ SOLN
INTRAMUSCULAR | Status: AC
  Administered 2011-03-21: 4 mg via INTRAVENOUS
  Filled 2011-03-21: qty 2

## 2011-03-21 MED ORDER — MIDAZOLAM HCL 2 MG/2ML IJ SOLN
1.0000 mg | INTRAMUSCULAR | Status: DC | PRN
  Administered 2011-03-21: 2 mg via INTRAVENOUS

## 2011-03-21 MED ORDER — ONDANSETRON HCL 4 MG/2ML IJ SOLN: 4.0000 mg | Freq: Once | INTRAMUSCULAR | Status: DC | PRN

## 2011-03-21 MED ORDER — IOHEXOL 350 MG/ML SOLN
INTRAVENOUS | Status: DC | PRN
  Administered 2011-03-21: 50 mL

## 2011-03-21 SURGICAL SUPPLY — 11 items
BALLN CRE LF 10-12 240X5.5 (BALLOONS) ×3
BALLN RETRIEVAL 12X15 (BALLOONS) ×3 IMPLANT
BALLOON CRE LF 10-12 240X5.5 (BALLOONS) ×2 IMPLANT
BASKET TRAPEZOID 3X6 (MISCELLANEOUS) ×3 IMPLANT
KIT ROOM TURNOVER APOR (KITS) ×3 IMPLANT
PADS WHITE FLOOR 20IN (PAD) ×3 IMPLANT
SNARE ROTATE MED OVAL 20MM (MISCELLANEOUS) ×3 IMPLANT
SPHINCTEROTOME HYDRATOME 44 (MISCELLANEOUS) ×3 IMPLANT
TUBING ENDO SMARTCAP PENTAX (MISCELLANEOUS) ×3 IMPLANT
TUBING IRRIGATION ENDOGATOR (MISCELLANEOUS) ×3 IMPLANT
WATER STERILE IRR 1000ML POUR (IV SOLUTION) ×6 IMPLANT

## 2011-03-21 NOTE — H&P (Addendum)
Lorenza Burton, NP  03/17/2011  2:48 PM  Signed Primary Care Physician:  Cassell Smiles., MD Primary Gastroenterologist:  Dr. Jena Gauss Hepatologist/Transplant Physician @ [facility] :  Dr. Jim Like RN Coordinator @ [facility] Artist:  Berneda Rose Dermatologist:  Dr. Jorja Loa    Chief Complaint   Patient presents with   .  MRCP      HPI:  [name] is a 69 y.o. male well known to Dr Jena Gauss, although he has not been seen here in over 1 yr.  He is over 5 yrs s/p orthotic liver transplant at [facility] for NASH cirrhosis.  He has done very well s/p transplant, but has struggled with his diabetes lately.  He tells me he started lantus insulin 2 1/2 wks ago & took x 3 days.  He then started vomiting & then itching the following day when he broke out in a rash all over.  He saw Dr. Sherwood Gambler & was given a shot & some prednisone which he completed yesterday.  He tells me his tacrolimus was adjusted recently because it was too low.  He was sent here from [facility] because there were plans for a liver bx given an increase in his LFTS.  Since his wife was to have surgery, he wanted testing to be done here in [place].  They are now requesting an MRCP be done here as well due to labs suggestive of mixed cholestatic pattern.  On 03/10/11 AST 130, ALT 169, ALP 395, TB 7.9, direct 5.1, tp 7.2, albumin 3.5.    His LFTs had previously been normal for the most part until this yr.  His hgb a1c has continued to climb.     He describes a similar episode on Aug 18 where he had N/V/D, but that was short-lived.  He has had high blood sugars ever since.  Last a1c 9.2 C/o pruritic rash.  Hx of rash for which he has been followed by Dr. Jorja Loa.  Mostly itched on hs back, now resolving.  C/o fatigue 1 mo.  He gives hx wt loss 30+ intentionally-cut carbs.  Exercises by working out in the yard, not intense.      Past Medical History   Diagnosis  Date   .  Hypothyroidism     .  HTN (hypertension)     .  CRI (chronic renal insufficiency)     .  DM  (diabetes mellitus)         Type II   .  Anemia     .  NASH (nonalcoholic steatohepatitis)     .  Squamous cell cancer of external ear         pinna   .  S/P colonoscopy with polypectomy  2004       Dr Jena Gauss   .  S/P endoscopy  03/2004       Hx esophgaeal varices, pyloric channel ulcer prior to transplant       Past Surgical History   Procedure  Date   .  Liver transplant  2006       [facility] cirrhosis       Current Outpatient Prescriptions   Medication  Sig  Dispense  Refill   .  aspirin 81 MG tablet  Take 81 mg by mouth daily.           Marland Kitchen  glimepiride (AMARYL) 4 MG tablet  Take 4 mg by mouth 2 (two) times daily.          Marland Kitchen  levothyroxine (SYNTHROID, LEVOTHROID) 100 MCG tablet  Take 100 mcg by mouth daily.          Marland Kitchen  lisinopril (PRINIVIL,ZESTRIL) 20 MG tablet  Take 20 mg by mouth daily.          .  magnesium 30 MG tablet  Take 30 mg by mouth 2 (two) times daily. With protein          .  Multiple Vitamin (MULTIVITAMIN) capsule  Take 1 capsule by mouth daily.           .  mycophenolate (CELLCEPT) 250 MG capsule  Take by mouth 2 (two) times daily.           .  saxagliptin HCl (ONGLYZA) 5 MG TABS tablet  Take 5 mg by mouth daily.           .  tacrolimus (PROGRAF) 1 MG capsule  Take 1 mg by mouth 2 (two) times daily.               Allergies as of 03/16/2011 - Review Complete 03/16/2011   Allergen  Reaction  Noted   .  Lantus  Rash  03/16/2011       Family History   Problem  Relation  Age of Onset   .  Cirrhosis  Father         nash   .  Lymphoma  Mother     .  Diabetes  Daughter         History       Social History   .  Marital Status:  Married       Spouse Name:  N/A       Number of Children:  1   .  Years of Education:  N/A       Occupational History   .  RETIRED         Social History Main Topics   .  Smoking status:  Never Smoker    .  Smokeless tobacco:  Not on file   .  Alcohol Use:  No   .  Drug Use:  No   .  Sexually Active:  Not on file    Review  of Systems: Gen: Denies any fever, chills.  See HPI. CV: Denies chest pain, angina, palpitations, syncope, orthopnea, PND, peripheral edema, and claudication. Resp: Denies dyspnea at rest, dyspnea with exercise, cough, sputum, wheezing, coughing up blood, and pleurisy. GI: Denies vomiting blood, jaundice, and fecal incontinence.   Denies dysphagia or odynophagia. GU : Denies urinary burning, blood in urine, urinary frequency, urinary hesitancy, nocturnal urination, and urinary incontinence. MS: Denies joint pain, limitation of movement, and swelling, stiffness, low back pain, extremity pain. Denies muscle weakness, cramps, atrophy.   Derm: See HPI.Marland Kitchen   Psych: Denies depression, anxiety, memory loss, suicidal ideation, hallucinations, paranoia, and confusion. Heme: Denies bruising, bleeding, and enlarged lymph nodes.   Physical Exam: BP 134/87  Pulse 93  Temp(Src) 97.6 F (36.4 C) (Temporal)  Ht 5\' 9"  (1.753 m)  Wt 209 lb (94.802 kg)  BMI 30.86 kg/m2 General:   Alert,  Well-developed, obese, pleasant and cooperative in NAD Head:  Normocephalic and atraumatic. Eyes:  Sclera clear, no icterus.   Conjunctiva pink. Ears:  Normal auditory acuity. Nose:  No deformity, discharge,  or lesions. Mouth:  No deformity or lesions.  OP Neck:  Supple; no masses or thyromegaly. Lungs:  Clear throughout to auscultation.   No wheezes, crackles, or rhonchi. No acute  distress. Heart:  Regular rate and rhythm; no murmurs, clicks, rubs,  or gallops. Abdomen:  Soft, obese, nontender and nondistended. No masses, hepatosplenomegaly or hernias noted. Normal bowel sounds, without guarding, and without rebound.    Rectal:  Deferred.   Msk:  Symmetrical without gross deformities. Normal posture. Pulses:  Normal pulses noted. Extremities:  Without clubbing or edema. Neurologic:  Alert and  oriented x4;  grossly normal neurologically. Skin:  Annular erythematous macular rash w/excoriation to upper back, abd, neck,  legs. Cervical Nodes:  No significant cervical adenopathy. Psych:  Alert and cooperative. Normal mood and affect.         Glendora Score  03/19/2011  8:48 AM  Signed Cc to PCP        LFTs abnormal Lorenza Burton, NP  03/17/2011  2:46 PM  Signed [name] is a 70 y.o. caucasian male w/ elevated LFTs including direct hyperbilirubinemia s/p orthotopic liver transplant over 5 yrs ago for NASH cirrhosis.  He also has had a rash & abdominal pain, vomiting & diarrhea which is resolving now after treatment w/ prednisone.  He is being evaluated in conjunction with correspondence from [facility].   Dr. Jena Gauss was asked to evaluation pt while in the office as well.  Plans are being made for liver bx and MRCP next week locally with [facility] pending results.  Differentials include host versus graft disease, acute drug reaction, or opportunistic infection.   Await labs drawn yesterday at [facility] Appt w/ Dr Jorja Loa ASAP Follow up w/ [facility] as planned FU MRCP & liver bx  DIABETES MELLITUS, TYPE II Lorenza Burton, NP  03/17/2011  2:47 PM  Signed Work w/ Dr Sherwood Gambler to improve Hgba1c    Author:  Corbin Ade, MD  Service:  Gastroenterology  Author Type:  Physician   Filed:  03/20/11 1622  Note Time:  03/20/11 1546      Related Notes:  Original Note by: Gerrit Halls, NP filed at 03/20/11 1548       Pt inpatient currently but has been discharged home. Awaiting ride. Spoke with Dr. Jena Gauss, and we will proceed with ERCP tomorrow due to findings on MRCP. It is fine for patient to go home overnight and return in the morning. Dr. Karilyn Cota notified as well.  The liver biopsy that was scheduled for tomorrow will be cancelled by our office.  As outlined above. I have reviewed the MRCP with Dr. Tyron Russell. Findings are compelling for multiple common duct stones with a dilated bile duct. No stricture. Some atrophy of the left lobe of the liver. A meandering hepatic artery - no definite stenosis and no  space-occupying lesion in the liver.  Common duct stones could easily explain his recent bump in LFTs and his recent bout of "gastroenteritis". We'll hold off liver biopsy scheduled for tomorrow. The patient needs an ERCP with sphincterotomy and stone extraction. I discussed the scenario with the transplant coordinator, Steward Drone, at the [place] [facility] [place] . She asked that we take care of ERCP down here.  I have offered Mr. Garlitz an ERCP with sphincterotomy and stone extraction tomorrow morning. I reviewed the risks benefits, limitations, alternatives and imponderables with Mr. and Mrs. Ciampa at the bedside this afternoon. I specifically talked about a 1 in 10 chance of pancreatitis, potential need for stent placement (which would necessitate a subsequent procedure) and the potential for failed procedure. They Understand if lfts do not subsequently normalized after stones removed, he  may still need a liver biopsy and further evaluation. Questions have been answered. They very much want to proceed tomorrow.        I have seen the patient prior to the procedure(s) today and reviewed the history and physical / consultation from 03/17/11 and 03/20/11.  There have been no changes. After consideration of the risks, benefits, alternatives and imponderables, the patient has consented to the procedure(s).

## 2011-03-21 NOTE — Anesthesia Preprocedure Evaluation (Addendum)
Anesthesia Evaluation  Name, MR# and DOB Patient awake  General Assessment Comment  Reviewed: Allergy & Precautions, H&P , NPO status , Patient's Chart, lab work & pertinent test results  History of Anesthesia Complications Negative for: history of anesthetic complications  Airway Mallampati: I TM Distance: >3 FB Neck ROM: Full    Dental  (+) Teeth Intact   Pulmonary    pulmonary exam normalPulmonary Exam Normal     Cardiovascular hypertension, Pt. on medications Regular Normal    Neuro/Psych   GI/Hepatic/Renal          (+) Hepatitis - (NASH, s/p Transplant) CRFRenal disease  Endo/Other  (+) Diabetes mellitus-, Poorly Controlled, Type 2, Insulin Dependent,Hypothyroidism,      Abdominal (+) obese,   Musculoskeletal   Hematology   Peds  Reproductive/Obstetrics    Anesthesia Other Findings             Anesthesia Physical Anesthesia Plan  ASA: III  Anesthesia Plan: General   Post-op Pain Management:    Induction: Intravenous, Rapid sequence and Cricoid pressure planned  Airway Management Planned: Oral ETT  Additional Equipment:   Intra-op Plan:   Post-operative Plan: Extubation in OR  Informed Consent: I have reviewed the patients History and Physical, chart, labs and discussed the procedure including the risks, benefits and alternatives for the proposed anesthesia with the patient or authorized representative who has indicated his/her understanding and acceptance.     Plan Discussed with:   Anesthesia Plan Comments:         Anesthesia Quick Evaluation

## 2011-03-21 NOTE — Anesthesia Postprocedure Evaluation (Signed)
  Anesthesia Post-op Note  Patient: [name]  Procedure(s) Performed:  ENDOSCOPIC RETROGRADE CHOLANGIOPANCREATOGRAPHY (ERCP) - Stone Extraction and Stent Removal, Dilation, stent placement ; SPHINCTEROTOMY  Patient Location: PACU  Anesthesia Type: General  Level of Consciousness: awake, alert  and oriented  Airway and Oxygen Therapy: Patient Spontanous Breathing and Patient connected to face mask oxygen  Post-op Pain: none  Post-op Assessment: Post-op Vital signs reviewed, Patient's Cardiovascular Status Stable and Respiratory Function Stable  Post-op Vital Signs: Reviewed and stable  Complications: No apparent anesthesia complications

## 2011-03-21 NOTE — Transfer of Care (Signed)
Immediate Anesthesia Transfer of Care Note  Patient: [name]  Procedure(s) Performed:  ENDOSCOPIC RETROGRADE CHOLANGIOPANCREATOGRAPHY (ERCP) - Stone Extraction and Stent Removal, Dilation, stent placement ; SPHINCTEROTOMY  Patient Location: PACU  Anesthesia Type: General  Level of Consciousness: awake and oriented  Airway & Oxygen Therapy: Patient Spontanous Breathing and Patient connected to face mask oxygen  Post-op Assessment: Report given to PACU RN, Post -op Vital signs reviewed and stable and Patient moving all extremities  Post vital signs: Reviewed and stable  Complications: No apparent anesthesia complications

## 2011-03-21 NOTE — Consult Note (Signed)
NAME:  [name], [name]                ACCOUNT NO.:  [id number]  MEDICAL RECORD NO.:  [id number]  LOCATION:  [facility]                          FACILITY:  [facility]  PHYSICIAN:  Purcell Nails, MD DATE OF BIRTH:  08-09-1940  DATE OF CONSULTATION: DATE OF DISCHARGE:                                CONSULTATION   REASON FOR CONSULTATION:  Uncontrolled type 2 diabetes.  HISTORY OF PRESENT ILLNESS:  This is a 70 year old gentleman with multiple medical problems including liver failure from fatty liver, status post liver transplant in 2006.  During that process, he was given immunosuppressant therapy including high dose of steroids.  He was subsequently diagnosed with type 2 diabetes.  He was treated with various combinations of oral medications until two weeks ago when he was given Lantus.  He reportedly developed skin rash and developed nausea as a result of the Lantus.  He reports that his most recent A1c was above 10% at the [place].  He was admitted due to elevated blood sugar and feeling weak.  He was started on NovoLog until yesterday, at which time, I recommended NPH along with NovoLog, which helped to stabilize his numbers in 100-200 mg per day range.  PAST MEDICAL HISTORY: 1. Hypertension. 2. Chronic renal failure. 3. Hypothyroidism. 4. Diabetes type 2. 5. Anemia. 6. Nonalcoholic steatohepatitis. 7. Squamous cell cancer of external ear.  PAST SURGICAL HISTORY:  Liver transplant and mild surgery.  HOME MEDICATIONS: 1. Aspirin. 2. Docusate. 3. Glimepiride 4 mg daily. 4. Levothyroxine 100 mcg daily. 5. Lisinopril 20 mg daily. 6. Magnesium. 7. Multiple vitamins. 8. Mycophenolate. 9. Prednisone 5 mg daily. 10.Onglyza 5 mg daily. 11.Tacrolimus.  ALLERGIES:  LANTUS, which causes rash.  SOCIAL HISTORY:  Negative for smoking, negative for alcohol, or drug use.  FAMILY HISTORY:  Significant for cirrhosis in his father, lymphoma in his mother, diabetes in his daughter.  REVIEW OF  SYSTEMS:  At this point, the patient denies chest pain, shortness of breath.  No nausea.  No vomiting.  No abdominal pain.  Rest of his systems have been reviewed and negative.  PHYSICAL EXAMINATION:  GENERAL:  He is alert and oriented x3. VITAL SIGNS:  Show blood pressure 131/76, pulse 98, temperature 97.7. HEENT:  Moist mucous membranes negative for icterus or pallor. NECK:  Negative for JVD. CHEST:  Clear to auscultation bilaterally. CARDIOVASCULAR:  Normal S1 and S2.  No murmur, no gallop. ABDOMEN:  Soft and nontender. EXTREMITIES:  No edema. CNS:  Nonfocal. SKIN:  No rash.  No hyperemia.  Lab work shows sodium 136, potassium 3.9, chloride 107, bicarb 20, BUN 30, creatinine 1.0.  Calcium 9.2.  ASSESSMENT AND PLAN: 1. Type 2 diabetes, chronically uncontrolled, question an allergy to     Lantus, reported A1c of more than 10%.  He would continue to     require basal bolus insulin, being not good candidate for oral     medications.  I will continue with NPH 10 units t.i.d. and NovoLog     10 units t.i.d. plus sliding scale.  His subsequent insulin dose     will be adjusted based on blood sugar readings.  I will offer him  outpatient followup in a week to adjust his treatment. 2. Hypothyroidism.  TFTs unknown, currently on levothyroxine 50 mcg     daily.  I will obtain TSH and free T4.  Thank you for the opportunity to participate in the care of this pleasant patient.  I will update his progress.          ______________________________ Purcell Nails, MD     GN/MEDQ  D:  03/21/2011  T:  03/21/2011  Job:  [id number]

## 2011-03-21 NOTE — Anesthesia Procedure Notes (Addendum)
Procedure Name: Intubation Date/Time: 03/21/2011 9:16 AM Performed by: Marylene Buerger Pre-anesthesia Checklist: Patient identified, Emergency Drugs available and Suction available Patient Re-evaluated:Patient Re-evaluated prior to inductionOxygen Delivery Method: Circle System Utilized Preoxygenation: Pre-oxygenation with 100% oxygen   Date/Time: 03/21/2011 9:22 AM Performed by: Marylene Buerger Intubation Type: IV induction, Rapid sequence and Circoid Pressure applied Ventilation: Mask ventilation without difficulty Laryngoscope Size: Mac and 3 Grade View: Grade I Tube type: Oral Tube size: 8.0 mm Airway Equipment and Method: patient positioned with wedge pillow Placement Confirmation: ETT inserted through vocal cords under direct vision and breath sounds checked- equal and bilateral Secured at: 22 cm Tube secured with: Tape Dental Injury: Teeth and Oropharynx as per pre-operative assessment

## 2011-03-21 NOTE — Progress Notes (Unsigned)
RMR called the office. Pt needs HFP done on Friday. Pt is to have it done at [facility] per RMR. pts wife and daughter are aware. Lab order faxed to [facility] lab and [facility].

## 2011-03-23 ENCOUNTER — Telehealth: Payer: Self-pay

## 2011-03-23 ENCOUNTER — Other Ambulatory Visit: Payer: Self-pay | Admitting: Internal Medicine

## 2011-03-23 LAB — HEPATIC FUNCTION PANEL
ALT: 120 U/L — ABNORMAL HIGH (ref 0–53)
AST: 70 U/L — ABNORMAL HIGH (ref 0–37)
Albumin: 2.1 g/dL — ABNORMAL LOW (ref 3.5–5.2)
Alkaline Phosphatase: 455 U/L — ABNORMAL HIGH (ref 39–117)
Bilirubin, Direct: 9 mg/dL — ABNORMAL HIGH (ref 0.0–0.3)
Total Bilirubin: 11.4 mg/dL — ABNORMAL HIGH (ref 0.3–1.2)

## 2011-03-23 NOTE — Telephone Encounter (Signed)
pts wife Corrie Dandy called this am. She stated pt was having some jaundice in his face and eyes this am. pts blood sugar had also dropped. He spoke with Dr. Fransico Him already about his blood sugar and they stopped his 70/30. pts wife did not have surgery this am d/t she was in afib. She is still in room 302 and pt is with her. Pt did have hfp drawn this am. Please advise.

## 2011-03-23 NOTE — Telephone Encounter (Signed)
Called [facility] lab- asked them to run pts hfp stat. They said they still have the blood in the lab and they would take it to the hospital to be run stat.

## 2011-03-26 ENCOUNTER — Encounter: Payer: Self-pay | Admitting: Urgent Care

## 2011-03-26 ENCOUNTER — Ambulatory Visit (INDEPENDENT_AMBULATORY_CARE_PROVIDER_SITE_OTHER): Payer: Medicare HMO | Admitting: Urgent Care

## 2011-03-26 ENCOUNTER — Telehealth: Payer: Self-pay | Admitting: Urgent Care

## 2011-03-26 ENCOUNTER — Other Ambulatory Visit: Payer: Self-pay | Admitting: Internal Medicine

## 2011-03-26 DIAGNOSIS — K831 Obstruction of bile duct: Secondary | ICD-10-CM

## 2011-03-26 DIAGNOSIS — R17 Unspecified jaundice: Secondary | ICD-10-CM

## 2011-03-26 DIAGNOSIS — K838 Other specified diseases of biliary tract: Secondary | ICD-10-CM

## 2011-03-26 LAB — HEPATIC FUNCTION PANEL
ALT: 121 U/L — ABNORMAL HIGH (ref 0–53)
AST: 97 U/L — ABNORMAL HIGH (ref 0–37)
Bilirubin, Direct: 5.8 mg/dL — ABNORMAL HIGH (ref 0.0–0.3)
Indirect Bilirubin: 2.1 mg/dL — ABNORMAL HIGH (ref 0.0–0.9)
Total Bilirubin: 7.9 mg/dL — ABNORMAL HIGH (ref 0.3–1.2)

## 2011-03-26 NOTE — Progress Notes (Addendum)
Referring Provider: Cassell Smiles., MD Primary Care Physician:  Kirk Ruths, MD Primary Gastroenterologist:  Dr. Jena Gauss Hepatologist/Transplant Physician @ [facility] : Dr. Jim Like  RN Coordinator @ [facility] Artist: Berneda Rose  Dermatologist: Dr. Jorja Loa    Chief Complaint  Patient presents with  . Follow-up    HPI:  [name] is a 70 y.o. male here for urgent work-in for obstructive jaundice.  Pt has hx of OLT at [facility] in 2006 for NASH cirrhosis.  Recently developed cholestasis & pruritis.  Had ERCP w/stent removal, sphincterotomy, dilation, & stent placement for extrahepatic biliary obstruction secondary to occluded biliary stent & choledocholithiasis by Dr Jena Gauss on 03/21/11.  Bili climbed to 11 two days later.  He was asked to have bili rechecked this morning.  He is c/o severe pruritis without relief from hydroxizine.  C/o both hands burning.  Feels profoundly weak like "hit by a mack truck."  He did feel a bit better after ERCP, but then developed low abd pain x 2days.  This has resolved and he is now pain free.  He has been on Cipro.  No BM x 2 days, now he is having clay-colored stools daily.  He denies nausea & vomiting.  Has had hot flashes but denies fever.  C/o hypoglycemia last week & his insulin regimen has been changed by Dr Fransico Him, his endocrinologist. Lab Summary Latest Ref Rng 03/26/2011 03/20/2011 03/19/2011  White count 4.0 - 10.5 K/uL (None) 11.4 (H) 19.5 (H)  Platelet count 150 - 400 K/uL (None) 242 258  Sodium 135 - 145 mEq/L (None) 136 132 (L)  Potassium 3.5 - 5.1 mEq/L (None) 3.9 4.9  Calcium 8.4 - 10.5 mg/dL (None) 9.2 9.1  Phosphorus  (None) (None) (None)  Creatinine 0.50 - 1.35 mg/dL (None) 1.61 0.96  AST 0 - 37 U/L 97 (H) 96 (H) 146 (H)  Alk Phos 39 - 117 U/L 619 (H) 347 (H) 357 (H)  Bilirubin 0.3 - 1.2 mg/dL 7.9 (H) 6.5 (H) 6.8 (H)  Glucose 70 - 99 mg/dL (None) 045 (H) 409 (H)  Cholesterol  (None) (None) (None)  HDL cholesterol  (None) (None) (None)  Triglycerides   (None) (None) (None)  LDL calc  (None) (None) (None)  LDL direct  (None) (None) (None)  Total protein 6.0 - 8.3 g/dL 5.6 (L) 6.3 6.2  Albumin 3.5 - 5.2 g/dL 2.1 (L) 2.5 (L) 2.4 (L)   Past Medical History  Diagnosis Date  . Hypothyroidism   . HTN (hypertension)   . CRI (chronic renal insufficiency)   . DM (diabetes mellitus)     Type II  . Anemia   . NASH (nonalcoholic steatohepatitis)   . Squamous cell cancer of external ear     pinna  . S/P colonoscopy with polypectomy 2004    Dr Jena Gauss  . S/P endoscopy 03/2004    Hx esophgaeal varices, pyloric channel ulcer prior to transplant  . History of ERCP 03/21/2011    Extrahepatic biliary obstruction secondary to occluded biliary stent and colon no cholelithiasis, status post sphincterotomy, stent removal and replacement and stone extraction.    Past Surgical History  Procedure Date  . Liver transplant 2006    [facility] cirrhosis  . Mouth surgery may 2012  . Ercp 03/21/2011    Procedure: ENDOSCOPIC RETROGRADE CHOLANGIOPANCREATOGRAPHY (ERCP);  Surgeon: Corbin Ade, MD;  Location: [facility];  Service: Gastroenterology;  Laterality: N/A;  Stent Removal, Balloon Dilation and Dredge, Basket Stone Extraction, Biliary Stent Placement   . Sphincterotomy  03/21/2011    Procedure: SPHINCTEROTOMY;  Surgeon: Corbin Ade, MD;  Location: [facility];  Service: Gastroenterology;;    Current Outpatient Prescriptions  Medication Sig Dispense Refill  . aspirin 81 MG tablet Take 81 mg by mouth daily.        . ciprofloxacin (CIPRO) 500 MG tablet Take 1 tablet (500 mg total) by mouth 2 (two) times daily.  20 tablet  0  . docusate sodium (EQUATE STOOL SOFTENER) 100 MG capsule Take 100 mg by mouth daily as needed. For occasional constipation       . hydrOXYzine (ATARAX) 25 MG tablet Take 1 tablet (25 mg total) by mouth 3 (three) times daily as needed for itching.  30 tablet  0  . insulin aspart (NOVOLOG) 100 UNIT/ML injection Inject 10 Units into the skin 3  (three) times daily with meals. Please provide a pen.  10 mL  6  . insulin aspart protamine-insulin aspart (NOVOLOG 70/30) (70-30) 100 UNIT/ML injection Inject 10 Units into the skin 2 (two) times daily with a meal. Please provide a pen if possible.  10 mL  6  . levothyroxine (SYNTHROID, LEVOTHROID) 100 MCG tablet Take 100 mcg by mouth daily.       Marland Kitchen linagliptin (TRADJENTA) 5 MG TABS tablet Take 1 tablet (5 mg total) by mouth daily.  30 tablet  0  . lisinopril (PRINIVIL,ZESTRIL) 20 MG tablet Take 20 mg by mouth daily.       . magnesium 30 MG tablet Take 30 mg by mouth 2 (two) times daily. With protein       . Multiple Vitamin (MULTIVITAMIN) capsule Take 1 capsule by mouth daily.        . Multiple Vitamins-Minerals (MULTIVITAMIN WITH MINERALS) tablet Take 1 tablet by mouth daily.        . mycophenolate (CELLCEPT) 250 MG capsule Take 500 mg by mouth 2 (two) times daily.       . predniSONE (STERAPRED UNI-PAK) 5 MG TABS Take 5 mg by mouth Once daily as needed.      Marland Kitchen Specialty Vitamins Products (MAGNESIUM, AMINO ACID CHELATE,) 133 MG tablet Take 1 tablet by mouth 2 (two) times daily. Magnesium 133mg        . tacrolimus (PROGRAF) 1 MG capsule Take 1-2 mg by mouth 2 (two) times daily. Takes 2 pills every morning and 1 pill at supper        Allergies as of 03/26/2011 - Review Complete 03/26/2011  Allergen Reaction Noted  . Lantus Rash 03/16/2011    Review of Systems: See HPI, otherwise negative ROS  Physical Exam: BP 116/71  Pulse 98  Temp(Src) 97.6 F (36.4 C) (Temporal)  Ht 5\' 9"  (1.753 m)  Wt 215 lb 6.4 oz (97.705 kg)  BMI 31.81 kg/m2 General:   Alert,  Pale, jaundiced, pleasant and cooperative male who appears quite fatigued. Head:  Normocephalic and atraumatic. Eyes:  Sclera w/ mod icterus.   Conjunctiva pale. Mouth:  No deformity or lesions, dentition normal. Neck:  Supple; no masses or thyromegaly. Heart:  Regular rate and rhythm; no murmurs, clicks, rubs,  or gallops. Abdomen:   Soft, nontender and nondistended. No masses, hepatosplenomegaly or hernias noted. Normal bowel sounds, without guarding, and without rebound.  Exam limited given body habitus. Msk:  Symmetrical without gross deformities. Normal posture. Pulses:  Normal pulses noted. Extremities:  With clubbing.  No edema. Neurologic:  Alert and  oriented x4;  grossly normal neurologically. Skin:  Jaundiced.  +multiple annular erythematous macules and  some scabbing to abdomen & upper & lower extremities & neck.   Cervical Nodes:  No significant cervical adenopathy. Psych:  Alert and cooperative. Depressed mood.

## 2011-03-26 NOTE — Telephone Encounter (Signed)
Spoke w/ Dr Jena Gauss, Berneda Rose (post-transplant coordinator), pt & pt's wife. Reviewed labs & pt status. It is felt it would be in the best interest for pt to go to [facility] via the Emergency Dept. His daughter can drive him tonight. He will take CD of MRCP, cholangiogram & ERCP note, labs All parties agreeable

## 2011-03-26 NOTE — Progress Notes (Unsigned)
RMR called this am. pts bilirubin is 11 which is higher than when pt had MRCP. Pt needs stat lfts today and appt with extender. Pt aware, lab order faxed to [facility] and pt has appt with KJ at 1:30pm.

## 2011-03-27 ENCOUNTER — Encounter: Payer: Self-pay | Admitting: Urgent Care

## 2011-03-27 DIAGNOSIS — K831 Obstruction of bile duct: Secondary | ICD-10-CM | POA: Insufficient documentation

## 2011-03-27 NOTE — Assessment & Plan Note (Signed)
[name] is s/p ERCP for extrahepatic biliary obstruction secondary to occluded stent.  He underwent sphincterotomy, stent & stone removal & replacement.  He most likely has biliary obstruction secondary to choledocholithiasis.  His bilirubin remains elevated.  He is on Cipro.  He is s/p OLT for NASH 2006.  He is having pruritis not controlled by hydroxyzine.  I discussed his status w/ Dr Jena Gauss.  He wanted [name] to be seen at [facility] by his transplant team to consider repeat ERCP there and further management.  I spoke w/ [name] at [phone] & discussed his status & our recommendations.  She advised pt to go directly to the Emergency Dept.  I discussed w/pt & pt's wife who is inpatient at [facility].  Both agree w/ the plan.  Pt was given copies of CD w/ MRCP, cholangiogram, ERCP note, & all labs for [facility] team.   (Late entry secondary to [facility] Downtime)

## 2011-03-28 NOTE — Progress Notes (Signed)
Cc to PCP & [facility]

## 2011-03-28 NOTE — Progress Notes (Signed)
Encounter addended by: Clarene Critchley on: 03/28/2011  8:13 AM<BR>     Documentation filed: Flowsheet VN

## 2011-03-30 NOTE — Progress Notes (Signed)
Encounter addended by: Donnetta Hutching, MD on: 03/30/2011  9:51 AM<BR>     Documentation filed: Inpatient Notes, Charting

## 2011-04-03 DIAGNOSIS — Z9889 Other specified postprocedural states: Secondary | ICD-10-CM | POA: Insufficient documentation

## 2011-04-03 DIAGNOSIS — Z85828 Personal history of other malignant neoplasm of skin: Secondary | ICD-10-CM | POA: Insufficient documentation

## 2011-04-03 DIAGNOSIS — R748 Abnormal levels of other serum enzymes: Secondary | ICD-10-CM | POA: Insufficient documentation

## 2011-04-03 DIAGNOSIS — K8020 Calculus of gallbladder without cholecystitis without obstruction: Secondary | ICD-10-CM | POA: Insufficient documentation

## 2011-04-03 DIAGNOSIS — K861 Other chronic pancreatitis: Secondary | ICD-10-CM | POA: Insufficient documentation

## 2011-04-09 NOTE — Progress Notes (Signed)
Encounter addended by: Karen Kays on: 04/09/2011 10:10 AM<BR>     Documentation filed: Flowsheet VN

## 2011-04-10 ENCOUNTER — Encounter: Payer: Self-pay | Admitting: Internal Medicine

## 2011-04-20 ENCOUNTER — Telehealth: Payer: Self-pay | Admitting: Urgent Care

## 2011-04-20 NOTE — Telephone Encounter (Signed)
RMR wants pt to come in for FU appt s/p ERCP Please make w/ RMR or me on RMR day in office Thanks

## 2011-04-24 NOTE — Telephone Encounter (Signed)
Pt's wife is aware of OV for 10/26 at 10 with RMR she said pt was having lab work done, but wasn't sure when and that pt may want to wait until all the lab results are back before scheduling OV. She is to have Pt call to confirm either way.

## 2011-05-04 ENCOUNTER — Telehealth: Payer: Self-pay | Admitting: Internal Medicine

## 2011-05-04 ENCOUNTER — Ambulatory Visit: Payer: Medicare HMO | Admitting: Internal Medicine

## 2011-05-04 ENCOUNTER — Encounter: Payer: Self-pay | Admitting: Internal Medicine

## 2011-05-04 NOTE — Progress Notes (Signed)
I called the patient to check on him. He missed his appointment today. We have a scanned in hand written copy of labs from 04/02/2011. Bilirubin down to 3.3. The ERCP report from the [facility]  indicates that they removed the stent placed here and removed additional stones from his bile duct. I left a message with his answering service for him to call us back and let us know how he's doing.

## 2011-05-04 NOTE — Telephone Encounter (Signed)
Pt was a no show

## 2011-05-18 ENCOUNTER — Encounter: Payer: Self-pay | Admitting: Internal Medicine

## 2011-05-18 NOTE — Progress Notes (Signed)
  Spoke to patient on the phone today. He is doing well. He brought labs by the [place] [facility] [place] had ordered. His LFTs were completely normal. He is now on insulin. His hemoglobin A1c is down to 6.2. He missed last week's appointment with me. I told him I would arrange to see him sometime between now and Christmas. If anything came up between now and then, he is to let me know.

## 2011-05-21 ENCOUNTER — Encounter: Payer: Self-pay | Admitting: Internal Medicine

## 2011-05-21 NOTE — Progress Notes (Signed)
Pt is aware of OV on 06/29/11 at 11am with RMR and appt card was mailed.

## 2011-06-29 ENCOUNTER — Ambulatory Visit: Payer: Medicare HMO | Admitting: Internal Medicine

## 2011-06-29 ENCOUNTER — Telehealth: Payer: Self-pay

## 2011-06-29 NOTE — Telephone Encounter (Signed)
Pt had an apoointment today with RMR. He checked in about 10:45 but his appointment was not until 11:00. I informed him that RMR was running late and would he like to wait and he said no because he had another appointment. He stated that we made the appointment for him, he did not. We gave him back his check and let him know we could call him to reschedule for a later date.

## 2011-06-30 NOTE — Telephone Encounter (Signed)
Called pt and apologized for pt wait yesterday- explained our situation; reviewed labs form November - looked good. Rcommended pt return in Fall 2013 (or sooner if problems arise) - pt needs to be first on schedule on that day.

## 2011-07-04 NOTE — Telephone Encounter (Signed)
Pt was added to RMR's schedule to come in on 07/13/11 at 1030. Do we still need this OV for [name] or wait until later this Fall?

## 2011-07-11 NOTE — Telephone Encounter (Signed)
Reminder in epic to follow up in the Fall 2013 with RMR ONLY

## 2011-07-13 ENCOUNTER — Ambulatory Visit: Payer: Medicare HMO | Admitting: Internal Medicine

## 2012-02-28 ENCOUNTER — Ambulatory Visit (INDEPENDENT_AMBULATORY_CARE_PROVIDER_SITE_OTHER): Payer: Medicare HMO | Admitting: Orthopedic Surgery

## 2012-02-28 ENCOUNTER — Ambulatory Visit (INDEPENDENT_AMBULATORY_CARE_PROVIDER_SITE_OTHER): Payer: Medicare HMO

## 2012-02-28 ENCOUNTER — Encounter: Payer: Self-pay | Admitting: Orthopedic Surgery

## 2012-02-28 VITALS — BP 118/70 | Ht 69.0 in | Wt 249.0 lb

## 2012-02-28 DIAGNOSIS — M25569 Pain in unspecified knee: Secondary | ICD-10-CM

## 2012-02-28 DIAGNOSIS — M1710 Unilateral primary osteoarthritis, unspecified knee: Secondary | ICD-10-CM

## 2012-02-28 DIAGNOSIS — M25562 Pain in left knee: Secondary | ICD-10-CM

## 2012-02-28 NOTE — Progress Notes (Signed)
Subjective:    Patient ID: [name], male    DOB: [birth year], 71 y.o.   MRN: [id number] Chief Complaint  Patient presents with  . 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 He complains of sharp dull 7/10 constant pain in the anterior part of his knee over his kneecap with some tingling in the posterior part of his knee. His symptoms are worse when he gets up from a seated position and is somewhat better with over-the-counter pain medication  He has a history of snoring and easy bleeding status post liver transplant. His other review of systems were normal other than Arava she which came from a complication from a stent placed in his liver  Medical history reviewed  Knee Pain       Review of Systems     Objective:   Physical Exam  Constitutional: He is oriented to person, place, and time. He appears well-developed and well-nourished.  HENT:  Head: Normocephalic.  Eyes: Pupils are equal, round, and reactive to light.  Neck: Normal range of motion.  Cardiovascular: Normal rate and intact distal pulses.   Musculoskeletal:       Right knee: He exhibits no effusion.       Left knee: He exhibits no effusion.  Lymphadenopathy:    He has no cervical adenopathy.  Neurological: He is alert and oriented to person, place, and time. He has normal reflexes. No cranial nerve deficit. He exhibits normal muscle tone. Coordination normal.  Skin: Skin is warm and dry. No rash noted. No erythema. No pallor.  Psychiatric: He has a normal mood and affect. His behavior is normal. Judgment and thought content normal.  Right Knee Exam   Tenderness  The patient is experiencing no tenderness.     Range of Motion  Extension: 10  Right knee flexion: 125.   Muscle Strength   The patient has normal right knee strength.  Tests  McMurray:  Medial -  negative  Drawer:       Anterior - negative    Posterior - negative Varus: negative Valgus: negative  Other  Erythema: absent Scars: absent Sensation: normal Pulse: present Swelling: none Other tests: no effusion present   Left Knee Exam   Tenderness  The patient is experiencing tenderness in the medial joint line (Quadriceps tendon).  Range of Motion  Extension: 10  Left knee flexion: 115.   Muscle Strength   The patient has normal left knee strength.  Tests  McMurray:  Medial - negative Lateral - negative Drawer:       Anterior - negative     Posterior - negative Varus: negative Valgus: positive Patellar Apprehension: negative  Other  Erythema: absent Scars: absent Sensation: normal Pulse: present Swelling: none Effusion: no effusion present     x-rays show osteoarthritis of his knee primarily medial compartment most notable on lateral x-ray        Assessment & Plan:  Osteoarthritis  Mononeuritis  Injected knee to see if this helps if not he is to call her office back set up another appointment explore further into the mononeuritis aspect as well as reexamined the knee for meniscal pathology  Inject left knee Knee  Injection Procedure Note  Pre-operative Diagnosis: left knee oa  Post-operative Diagnosis: same  Indications: pain  Anesthesia: ethyl  chloride   Procedure Details   Verbal consent was obtained for the procedure. Time out was completed.The joint was prepped with alcohol, followed by  Ethyl chloride spray and A 20 gauge needle was inserted into the knee via lateral approach; 4ml 1% lidocaine and 1 ml of depomedrol  was then injected into the joint . The needle was removed and the area cleansed and dressed.  Complications:  None; patient tolerated the procedure well.

## 2012-02-28 NOTE — Progress Notes (Signed)
  Subjective:    Patient ID: [name], male    DOB: 27-Apr-1941, 71 y.o.   MRN: [id number]  Knee Pain       Review of Systems     Objective:   Physical Exam        Assessment & Plan:

## 2012-02-28 NOTE — Patient Instructions (Addendum)
You have received a steroid shot. 15% of patients experience increased pain at the injection site with in the next 24 hours. This is best treated with ice and tylenol extra strength 2 tabs every 8 hours. If you are still having pain please call the office.   Dx # 1 osteoarthritis  Dx # 2 mononeuritis/nerve inflammation

## 2012-03-19 ENCOUNTER — Encounter: Payer: Self-pay | Admitting: Internal Medicine

## 2012-04-22 ENCOUNTER — Ambulatory Visit (INDEPENDENT_AMBULATORY_CARE_PROVIDER_SITE_OTHER): Payer: Medicare HMO | Admitting: Internal Medicine

## 2012-04-22 ENCOUNTER — Encounter: Payer: Self-pay | Admitting: Internal Medicine

## 2012-04-22 VITALS — BP 130/84 | HR 83 | Temp 98.5°F | Ht 69.0 in | Wt 252.8 lb

## 2012-04-22 DIAGNOSIS — Z944 Liver transplant status: Secondary | ICD-10-CM

## 2012-04-22 NOTE — Patient Instructions (Addendum)
Will schedule a follow-up appointment in February 2014 to set up colonoscopy, etc.

## 2012-04-22 NOTE — Progress Notes (Signed)
Primary Care Physician:  Kirk Ruths, MD Primary Gastroenterologist:  Dr. Jena Gauss  Pre-Procedure History & Physical: HPI:  [name] is a 71 y.o. male here for followup. Status post orthotopic liver transplantation for Nash/cirrhosis in 2006. He's done very well. LFTs within normal. He remains significantly obese and has diabetes which is fairly poorly controlled. I last had contact with Mr. Schneck a year ago when I performed an ERCP and remove the old pediatric feeding tube stent and perform sphincterotomy remove multiple common duct stones and replaced the stent. Went to the [facility] and had the stent stones removed. Overall he is doing very well otherwise. He will be due for a routine screening colonoscopy in the spring of 2014. Last colonoscopy was in 2004 where he was found have an inflammatory polyp which was removed.  Past Medical History  Diagnosis Date  . Hypothyroidism   . HTN (hypertension)   . CRI (chronic renal insufficiency)   . DM (diabetes mellitus)     Type II  . Anemia   . NASH (nonalcoholic steatohepatitis) 2006    liver transplant  . Squamous cell cancer of external ear     pinna  . S/P colonoscopy with polypectomy 2004    Dr Jena Gauss  . S/P endoscopy 03/2004    Hx esophgaeal varices, pyloric channel ulcer prior to transplant  . History of ERCP 03/21/2011    Extrahepatic biliary obstruction secondary to occluded biliary stent and colon no cholelithiasis, status post sphincterotomy, stent removal and replacement and stone extraction.  . Biliary calculi, common bile duct   . Esophageal varices 03/19/11    mrcp  . Pancreatitis chronic 03/19/11    mrcp  . Elevated liver enzymes     Past Surgical History  Procedure Date  . Liver transplant 2006    [facility] cirrhosis  . Mouth surgery may 2012  . Ercp 03/21/2011    Dr. Jena Gauss- ERCP with removal of pediatric feeding tube, endoscopic sphinecterotomy with balloon dilation of ampulla followed by stone  extraction and stent placement  . Sphincterotomy 03/21/2011    Procedure: SPHINCTEROTOMY;  Surgeon: Corbin Ade, MD;  Location: [facility];  Service: Gastroenterology;;  . Mrcp 03/19/11    biliary ductal dilatation- stones. esophageal varices  . Bile duct stent placement 03/21/11    Dr. Jena Gauss  . Esophagogastroduodenoscopy 03/20/2004    Dr. Jolyn Nap 2 esophageal varices o/w normal esophageal mucosa, mild changes of snake skin in the gastric mucosa consistent with portal gastropathy, multiple antral erosions  and  a single pyloric channel ulcer benign appearance o/w normal gastric mucosa, normal D1 and S2  . Colonoscopy 08/12/2002    Dr. Jena Gauss- somewhat diffusely friable rectum and colon, polyp at 30 cm.    Prior to Admission medications   Medication Sig Start Date End Date Taking? Authorizing Provider  aspirin 81 MG tablet Take 81 mg by mouth daily.     Yes Historical Provider, MD  insulin detemir (LEVEMIR) 100 UNIT/ML injection Inject 30 Units into the skin at bedtime.    Yes Historical Provider, MD  levothyroxine (SYNTHROID, LEVOTHROID) 100 MCG tablet Take 100 mcg by mouth daily.  02/24/11  Yes Historical Provider, MD  lisinopril (PRINIVIL,ZESTRIL) 20 MG tablet Take 20 mg by mouth daily.  02/24/11  Yes Historical Provider, MD  Multiple Vitamins-Minerals (MULTIVITAMIN WITH MINERALS) tablet Take 1 tablet by mouth daily.     Yes Historical Provider, MD  mycophenolate (CELLCEPT) 250 MG capsule Take 500 mg by mouth 2 (two) times  daily.    Yes Historical Provider, MD  predniSONE (STERAPRED UNI-PAK) 5 MG TABS Take 5 mg by mouth Once daily as needed. 03/10/11  Yes Historical Provider, MD  Specialty Vitamins Products (MAGNESIUM, AMINO ACID CHELATE,) 133 MG tablet Take 1 tablet by mouth 2 (two) times daily. Magnesium 133mg     Yes Historical Provider, MD  tacrolimus (PROGRAF) 1 MG capsule Take 1-2 mg by mouth 2 (two) times daily. Takes 2 pills every morning and 1 pill at supper   Yes Historical Provider, MD    ursodiol (ACTIGALL) 300 MG capsule Take 300 mg by mouth daily.   Yes Historical Provider, MD  insulin aspart (NOVOLOG) 100 UNIT/ML injection Inject 10-16 Units into the skin 3 (three) times daily with meals. Please provide a pen. 03/20/11 03/19/12  Wilson Singer, MD    Allergies as of 04/22/2012 - Review Complete 04/22/2012  Allergen Reaction Noted  . Insulin glargine Rash 03/16/2011  . Contrast media (iodinated diagnostic agents)  03/27/2011    Family History  Problem Relation Age of Onset  . Cirrhosis Father     nash  . Lymphoma Mother   . Diabetes Daughter     History   Social History  . Marital Status: Married    Spouse Name: N/A    Number of Children: 1  . Years of Education: 14   Occupational History  . RETIRED   .     Social History Main Topics  . Smoking status: Never Smoker   . Smokeless tobacco: Never Used  . Alcohol Use: No  . Drug Use: No  . Sexually Active: Not on file   Other Topics Concern  . Not on file   Social History Narrative  . No narrative on file    Review of Systems: See HPI, otherwise negative ROS  Physical Exam: BP 130/84  Pulse 83  Temp 98.5 F (36.9 C) (Temporal)  Ht 5\' 9"  (1.753 m)  Wt 252 lb 12.8 oz (114.669 kg)  BMI 37.33 kg/m2 General:   Alert,  Well-developed, obese well-nourished, pleasant and cooperative in NAD Skin:  Intact without significant lesions or rashes. Eyes:  Sclera clear, no icterus.   Conjunctiva pink. Ears:  Normal auditory acuity. Nose:  No deformity, discharge,  or lesions. Mouth:  No deformity or lesions. Neck:  Supple; no masses or thyromegaly. No significant cervical adenopathy. Lungs:  Clear throughout to auscultation.   No wheezes, crackles, or rhonchi. No acute distress. Heart:  Regular rate and rhythm; no murmurs, clicks, rubs,  or gallops. Abdomen: Obese. Positive bowel sounds soft nontender without obvious mass or organomegaly  Pulses:  Normal pulses noted. Extremities:  Without clubbing  or edema.  Impression/Plan: Pleasant 71 year old gentleman status post orthotopic liver transplantation for Nash/cirrhosis in 2006. A very well. He remains significantly obese and has poorly controlled diabetes as his major health issue at this time. I recommend a better glycemic control and weight loss. He will be due for routine screening colonoscopy in 2014.  Recommendations: Plan for return office visit in February of next year to set up for a screening colonoscopy, etc.

## 2012-07-07 ENCOUNTER — Encounter: Payer: Self-pay | Admitting: *Deleted

## 2012-08-29 ENCOUNTER — Ambulatory Visit (INDEPENDENT_AMBULATORY_CARE_PROVIDER_SITE_OTHER): Payer: Medicare Other | Admitting: Internal Medicine

## 2012-08-29 ENCOUNTER — Encounter: Payer: Self-pay | Admitting: Internal Medicine

## 2012-08-29 ENCOUNTER — Encounter (HOSPITAL_COMMUNITY): Payer: Self-pay | Admitting: Pharmacy Technician

## 2012-08-29 VITALS — BP 132/82 | HR 83 | Temp 97.3°F | Ht 69.5 in | Wt 255.8 lb

## 2012-08-29 DIAGNOSIS — Z944 Liver transplant status: Secondary | ICD-10-CM

## 2012-08-29 DIAGNOSIS — Z1211 Encounter for screening for malignant neoplasm of colon: Secondary | ICD-10-CM

## 2012-08-29 MED ORDER — PEG 3350-KCL-NA BICARB-NACL 420 G PO SOLR: 4000.0000 mL | ORAL | Status: DC

## 2012-08-29 NOTE — Patient Instructions (Addendum)
Schedule a screening colonoscopy  Decrease Levemir insulin to 18 units at bedtime the 2 nights before your colonoscopy

## 2012-08-29 NOTE — Progress Notes (Signed)
Primary Care Physician:  MCGOUGH,WILLIAM M, MD Primary Gastroenterologist:  Dr. Bruin Bolger  Pre-Procedure History & Physical: HPI:  [name] is a 71 y.o. male is here for a screening colonoscopy. Last examination 10 years ago. No bowel symptoms at this time. He status post orthotopic liver transplantation. He's done for her well. Most recent labs from February 4 demonstrated completely normal CBC glucose up at 154 BUN 30 his LFTs are completely normal BUN normal; creatinine normal at 1.21. Hemoglobin A1c 8.2  Past Medical History  Diagnosis Date  . Hypothyroidism   . HTN (hypertension)   . CRI (chronic renal insufficiency)   . DM (diabetes mellitus)     Type II  . Anemia   . NASH (nonalcoholic steatohepatitis) 2006    liver transplant  . Squamous cell cancer of external ear     pinna  . S/P colonoscopy with polypectomy 2004    Dr Faustina Gebert  . S/P endoscopy 03/2004    Hx esophgaeal varices, pyloric channel ulcer prior to transplant  . History of ERCP 03/21/2011    Extrahepatic biliary obstruction secondary to occluded biliary stent and colon no cholelithiasis, status post sphincterotomy, stent removal and replacement and stone extraction.  . Biliary calculi, common bile duct   . Esophageal varices 03/19/11    mrcp  . Pancreatitis chronic 03/19/11    mrcp  . Elevated liver enzymes     Past Surgical History  Procedure Laterality Date  . Liver transplant  2006    [facility] cirrhosis  . Mouth surgery  may 2012  . Ercp  03/21/2011    Dr. Ashleynicole Mcclees- ERCP with removal of pediatric feeding tube, endoscopic sphinecterotomy with balloon dilation of ampulla followed by stone extraction and stent placement  . Sphincterotomy  03/21/2011    Procedure: SPHINCTEROTOMY;  Surgeon: Jayveon Convey M Taevyn Hausen, MD;  Location: [facility];  Service: Gastroenterology;;  . Mrcp  03/19/11    biliary ductal dilatation- stones. esophageal varices  . Bile duct stent placement  03/21/11    Dr. Yoshiaki Kreuser  . Esophagogastroduodenoscopy   03/20/2004    Dr. Davie Sagona-grade 2 esophageal varices o/w normal esophageal mucosa, mild changes of snake skin in the gastric mucosa consistent with portal gastropathy, multiple antral erosions  and  a single pyloric channel ulcer benign appearance o/w normal gastric mucosa, normal D1 and S2  . Colonoscopy  08/12/2002    Dr. Zyona Pettaway- somewhat diffusely friable rectum and colon, polyp at 30 cm.    Prior to Admission medications   Medication Sig Start Date End Date Taking? Authorizing Provider  aspirin 81 MG tablet Take 81 mg by mouth daily.     Yes Historical Provider, MD  insulin detemir (LEVEMIR) 100 UNIT/ML injection Inject 30 Units into the skin at bedtime.    Yes Historical Provider, MD  levothyroxine (SYNTHROID, LEVOTHROID) 100 MCG tablet Take 100 mcg by mouth daily.  02/24/11  Yes Historical Provider, MD  lisinopril (PRINIVIL,ZESTRIL) 20 MG tablet Take 20 mg by mouth daily.  02/24/11  Yes Historical Provider, MD  Multiple Vitamins-Minerals (MULTIVITAMIN WITH MINERALS) tablet Take 1 tablet by mouth daily.     Yes Historical Provider, MD  mycophenolate (CELLCEPT) 250 MG capsule Take 500 mg by mouth 2 (two) times daily.    Yes Historical Provider, MD  Specialty Vitamins Products (MAGNESIUM, AMINO ACID CHELATE,) 133 MG tablet Take 1 tablet by mouth 2 (two) times daily. Magnesium 133mg    Yes Historical Provider, MD  tacrolimus (PROGRAF) 1 MG capsule Take 1-2 mg by   mouth 2 (two) times daily. Takes 2 pills every morning and 1 pill at supper   Yes Historical Provider, MD  insulin aspart (NOVOLOG) 100 UNIT/ML injection Inject 10-16 Units into the skin 3 (three) times daily with meals. Please provide a pen. 03/20/11 03/19/12  Nimish C Gosrani, MD  predniSONE (STERAPRED UNI-PAK) 5 MG TABS Take 5 mg by mouth Once daily as needed. 03/10/11   Historical Provider, MD  ursodiol (ACTIGALL) 300 MG capsule Take 300 mg by mouth daily.    Historical Provider, MD    Allergies as of 08/29/2012 - Review Complete 08/29/2012   Allergen Reaction Noted  . Insulin glargine Rash 03/16/2011  . Contrast media (iodinated diagnostic agents)  03/27/2011    Family History  Problem Relation Age of Onset  . Cirrhosis Father     nash  . Lymphoma Mother   . Diabetes Daughter     History   Social History  . Marital Status: Married    Spouse Name: N/A    Number of Children: 1  . Years of Education: 14   Occupational History  . RETIRED   .     Social History Main Topics  . Smoking status: Never Smoker   . Smokeless tobacco: Never Used  . Alcohol Use: No  . Drug Use: No  . Sexually Active: Not on file   Other Topics Concern  . Not on file   Social History Narrative  . No narrative on file    Review of Systems: See HPI, otherwise negative ROS  Physical Exam: BP 132/82  Pulse 83  Temp(Src) 97.3 F (36.3 C) (Oral)  Ht 5' 9.5" (1.765 m)  Wt 255 lb 12.8 oz (116.03 kg)  BMI 37.25 kg/m2 General:   Alert,  Well-developed, well-nourished, , obese pleasant and cooperative in NAD Head:  Normocephalic and atraumatic. Eyes:  Sclera clear, no icterus.   Conjunctiva pink. Ears:  Normal auditory acuity. Nose:  No deformity, discharge,  or lesions. Mouth:  No deformity or lesions, dentition normal. Neck:  Supple; no masses or thyromegaly. Lungs:  Clear throughout to auscultation.   No wheezes, crackles, or rhonchi. No acute distress. Heart:  Regular rate and rhythm; no murmurs, clicks, rubs,  or gallops. Abdomen:  Significantly obese. Positive bowel sounds. Soft, nontender and nondistended. No masses, hepatosplenomegaly or hernias noted.   Msk:  Symmetrical without gross deformities. Normal posture. Pulses:  Normal pulses noted. Extremities:  Without clubbing or edema. Neurologic:  Alert and  oriented x4;  grossly normal neurologically. Skin:  Intact without significant lesions or rashes. Cervical Nodes:  No significant cervical adenopathy. Psych:  Alert and cooperative. Normal mood and  affect.  Impression/Plan: [name] is now here to undergo a screening colonoscopy.  Average risk screening examination. He is doing very well for standpoint of his liver transplant.  He is overweight and has suboptimal control of his blood sugars. Plan for average risk screening colonoscopy in the near future. The risks, benefits, limitations, imponderables and alternatives regarding colonoscopy have been reviewed with the patient. Questions have been answered. All parties agreeable. We'll cut back on his bedtime insulin to 18 units of Levemir the 2 nights prior to the procedure during the prep period.. 

## 2012-09-08 ENCOUNTER — Ambulatory Visit (HOSPITAL_COMMUNITY)
Admission: RE | Admit: 2012-09-08 | Discharge: 2012-09-08 | Disposition: A | Discharge status: Home / Self Care | Payer: Medicare Other | Source: Ambulatory Visit | Attending: Internal Medicine | Admitting: Internal Medicine

## 2012-09-08 ENCOUNTER — Encounter (HOSPITAL_COMMUNITY): Payer: Self-pay | Admitting: *Deleted

## 2012-09-08 ENCOUNTER — Encounter (HOSPITAL_COMMUNITY)
Admission: RE | Disposition: A | Discharge status: Home / Self Care | Payer: Self-pay | Source: Ambulatory Visit | Attending: Internal Medicine

## 2012-09-08 DIAGNOSIS — E119 Type 2 diabetes mellitus without complications: Secondary | ICD-10-CM | POA: Insufficient documentation

## 2012-09-08 DIAGNOSIS — I10 Essential (primary) hypertension: Secondary | ICD-10-CM | POA: Insufficient documentation

## 2012-09-08 DIAGNOSIS — Z1211 Encounter for screening for malignant neoplasm of colon: Secondary | ICD-10-CM | POA: Insufficient documentation

## 2012-09-08 DIAGNOSIS — K5730 Diverticulosis of large intestine without perforation or abscess without bleeding: Secondary | ICD-10-CM | POA: Insufficient documentation

## 2012-09-08 HISTORY — PX: COLONOSCOPY: SHX5424

## 2012-09-08 SURGERY — COLONOSCOPY
Anesthesia: Moderate Sedation

## 2012-09-08 MED ORDER — SODIUM CHLORIDE 0.45 % IV SOLN
INTRAVENOUS | Status: DC
  Administered 2012-09-08: 1000 mL via INTRAVENOUS

## 2012-09-08 MED ORDER — ONDANSETRON HCL 4 MG/2ML IJ SOLN
INTRAMUSCULAR | Status: AC
  Filled 2012-09-08: qty 2

## 2012-09-08 MED ORDER — STERILE WATER FOR IRRIGATION IR SOLN
Status: DC | PRN
  Administered 2012-09-08: 11:00:00

## 2012-09-08 MED ORDER — MIDAZOLAM HCL 5 MG/5ML IJ SOLN
INTRAMUSCULAR | Status: DC | PRN
  Administered 2012-09-08: 2 mg via INTRAVENOUS

## 2012-09-08 MED ORDER — MIDAZOLAM HCL 5 MG/5ML IJ SOLN
INTRAMUSCULAR | Status: AC
  Filled 2012-09-08: qty 10

## 2012-09-08 MED ORDER — MEPERIDINE HCL 100 MG/ML IJ SOLN
INTRAMUSCULAR | Status: DC | PRN
  Administered 2012-09-08: 50 mg via INTRAVENOUS

## 2012-09-08 MED ORDER — ONDANSETRON HCL 4 MG/2ML IJ SOLN
INTRAMUSCULAR | Status: DC | PRN
  Administered 2012-09-08: 4 mg via INTRAVENOUS

## 2012-09-08 MED ORDER — MEPERIDINE HCL 100 MG/ML IJ SOLN
INTRAMUSCULAR | Status: AC
  Filled 2012-09-08: qty 1

## 2012-09-08 NOTE — H&P (View-Only) (Signed)
Primary Care Physician:  Kirk Ruths, MD Primary Gastroenterologist:  Dr. Jena Gauss  Pre-Procedure History & Physical: HPI:  [name] is a 72 y.o. male is here for a screening colonoscopy. Last examination 10 years ago. No bowel symptoms at this time. He status post orthotopic liver transplantation. He's done for her well. Most recent labs from February 4 demonstrated completely normal CBC glucose up at 154 BUN 30 his LFTs are completely normal BUN normal; creatinine normal at 1.21. Hemoglobin A1c 8.2  Past Medical History  Diagnosis Date  . Hypothyroidism   . HTN (hypertension)   . CRI (chronic renal insufficiency)   . DM (diabetes mellitus)     Type II  . Anemia   . NASH (nonalcoholic steatohepatitis) 2006    liver transplant  . Squamous cell cancer of external ear     pinna  . S/P colonoscopy with polypectomy 2004    Dr Jena Gauss  . S/P endoscopy 03/2004    Hx esophgaeal varices, pyloric channel ulcer prior to transplant  . History of ERCP 03/21/2011    Extrahepatic biliary obstruction secondary to occluded biliary stent and colon no cholelithiasis, status post sphincterotomy, stent removal and replacement and stone extraction.  . Biliary calculi, common bile duct   . Esophageal varices 03/19/11    mrcp  . Pancreatitis chronic 03/19/11    mrcp  . Elevated liver enzymes     Past Surgical History  Procedure Laterality Date  . Liver transplant  2006    UVA-NASH cirrhosis  . Mouth surgery  may 2012  . Ercp  03/21/2011    Dr. Jena Gauss- ERCP with removal of pediatric feeding tube, endoscopic sphinecterotomy with balloon dilation of ampulla followed by stone extraction and stent placement  . Sphincterotomy  03/21/2011    Procedure: SPHINCTEROTOMY;  Surgeon: Corbin Ade, MD;  Location: [facility];  Service: Gastroenterology;;  . Mrcp  03/19/11    biliary ductal dilatation- stones. esophageal varices  . Bile duct stent placement  03/21/11    Dr. Jena Gauss  . Esophagogastroduodenoscopy   03/20/2004    Dr. Jolyn Nap 2 esophageal varices o/w normal esophageal mucosa, mild changes of snake skin in the gastric mucosa consistent with portal gastropathy, multiple antral erosions  and  a single pyloric channel ulcer benign appearance o/w normal gastric mucosa, normal D1 and S2  . Colonoscopy  08/12/2002    Dr. Jena Gauss- somewhat diffusely friable rectum and colon, polyp at 30 cm.    Prior to Admission medications   Medication Sig Start Date End Date Taking? Authorizing Provider  aspirin 81 MG tablet Take 81 mg by mouth daily.     Yes Historical Provider, MD  insulin detemir (LEVEMIR) 100 UNIT/ML injection Inject 30 Units into the skin at bedtime.    Yes Historical Provider, MD  levothyroxine (SYNTHROID, LEVOTHROID) 100 MCG tablet Take 100 mcg by mouth daily.  02/24/11  Yes Historical Provider, MD  lisinopril (PRINIVIL,ZESTRIL) 20 MG tablet Take 20 mg by mouth daily.  02/24/11  Yes Historical Provider, MD  Multiple Vitamins-Minerals (MULTIVITAMIN WITH MINERALS) tablet Take 1 tablet by mouth daily.     Yes Historical Provider, MD  mycophenolate (CELLCEPT) 250 MG capsule Take 500 mg by mouth 2 (two) times daily.    Yes Historical Provider, MD  Specialty Vitamins Products (MAGNESIUM, AMINO ACID CHELATE,) 133 MG tablet Take 1 tablet by mouth 2 (two) times daily. Magnesium 133mg     Yes Historical Provider, MD  tacrolimus (PROGRAF) 1 MG capsule Take 1-2 mg by  mouth 2 (two) times daily. Takes 2 pills every morning and 1 pill at supper   Yes Historical Provider, MD  insulin aspart (NOVOLOG) 100 UNIT/ML injection Inject 10-16 Units into the skin 3 (three) times daily with meals. Please provide a pen. 03/20/11 03/19/12  Nimish Normajean Glasgow, MD  predniSONE (STERAPRED UNI-PAK) 5 MG TABS Take 5 mg by mouth Once daily as needed. 03/10/11   Historical Provider, MD  ursodiol (ACTIGALL) 300 MG capsule Take 300 mg by mouth daily.    Historical Provider, MD    Allergies as of 08/29/2012 - Review Complete 08/29/2012   Allergen Reaction Noted  . Insulin glargine Rash 03/16/2011  . Contrast media (iodinated diagnostic agents)  03/27/2011    Family History  Problem Relation Age of Onset  . Cirrhosis Father     nash  . Lymphoma Mother   . Diabetes Daughter     History   Social History  . Marital Status: Married    Spouse Name: N/A    Number of Children: 1  . Years of Education: 14   Occupational History  . RETIRED   .     Social History Main Topics  . Smoking status: Never Smoker   . Smokeless tobacco: Never Used  . Alcohol Use: No  . Drug Use: No  . Sexually Active: Not on file   Other Topics Concern  . Not on file   Social History Narrative  . No narrative on file    Review of Systems: See HPI, otherwise negative ROS  Physical Exam: BP 132/82  Pulse 83  Temp(Src) 97.3 F (36.3 C) (Oral)  Ht 5' 9.5" (1.765 m)  Wt 255 lb 12.8 oz (116.03 kg)  BMI 37.25 kg/m2 General:   Alert,  Well-developed, well-nourished, , obese pleasant and cooperative in NAD Head:  Normocephalic and atraumatic. Eyes:  Sclera clear, no icterus.   Conjunctiva pink. Ears:  Normal auditory acuity. Nose:  No deformity, discharge,  or lesions. Mouth:  No deformity or lesions, dentition normal. Neck:  Supple; no masses or thyromegaly. Lungs:  Clear throughout to auscultation.   No wheezes, crackles, or rhonchi. No acute distress. Heart:  Regular rate and rhythm; no murmurs, clicks, rubs,  or gallops. Abdomen:  Significantly obese. Positive bowel sounds. Soft, nontender and nondistended. No masses, hepatosplenomegaly or hernias noted.   Msk:  Symmetrical without gross deformities. Normal posture. Pulses:  Normal pulses noted. Extremities:  Without clubbing or edema. Neurologic:  Alert and  oriented x4;  grossly normal neurologically. Skin:  Intact without significant lesions or rashes. Cervical Nodes:  No significant cervical adenopathy. Psych:  Alert and cooperative. Normal mood and  affect.  Impression/Plan: [name] is now here to undergo a screening colonoscopy.  Average risk screening examination. He is doing very well for standpoint of his liver transplant.  He is overweight and has suboptimal control of his blood sugars. Plan for average risk screening colonoscopy in the near future. The risks, benefits, limitations, imponderables and alternatives regarding colonoscopy have been reviewed with the patient. Questions have been answered. All parties agreeable. We'll cut back on his bedtime insulin to 18 units of Levemir the 2 nights prior to the procedure during the prep period.Marland Kitchen

## 2012-09-08 NOTE — Op Note (Signed)
[facility] [address] [place], [address]   COLONOSCOPY PROCEDURE REPORT  PATIENT: [name], [name]  MR#:         [id number] BIRTHDATE: 11-Jul-1940 , 71  yrs. old GENDER: Male ENDOSCOPIST: R.  Roetta Sessions, MD FACP FACG REFERRED BY:  Karleen Hampshire, M.D.  and the [facility] [place] [facility] PROCEDURE DATE:  09/08/2012 PROCEDURE:     Screening colonoscopy  INDICATIONS: Average risk colorectal cancer screening  INFORMED CONSENT:  The risks, benefits, alternatives and imponderables including but not limited to bleeding, perforation as well as the possibility of a missed lesion have been reviewed.  The potential for biopsy, lesion removal, etc. have also been discussed.  Questions have been answered.  All parties agreeable. Please see the history and physical in the medical record for more information.  MEDICATIONS: Versed 2 mg IV in Demerol 50 mg IV. Zofran 4 mg  DESCRIPTION OF PROCEDURE:  After a digital rectal exam was performed, the Pentax Colonoscope [id number]  colonoscope was advanced from the anus through the rectum and colon to the area of the cecum, ileocecal valve and appendiceal orifice.  The cecum was deeply intubated.  These structures were well-seen and photographed for the record.  From the level of the cecum and ileocecal valve, the scope was slowly and cautiously withdrawn.  The mucosal surfaces were carefully surveyed utilizing scope tip deflection to facilitate fold flattening as needed.  The scope was pulled down into the rectum where a thorough examination including retroflexion was performed.    FINDINGS:  Adequate preparation. Normal rectum. Scattered left-sided diverticula; the remainder of the colonic mucosa appeared normal.  THERAPEUTIC / DIAGNOSTIC MANEUVERS PERFORMED:  None  COMPLICATIONS: none  CECAL WITHDRAWAL TIME:  9 minutes  IMPRESSION:  Colonic diverticulosis  RECOMMENDATIONS: Repeat screening  colonoscopy in 10 years   _______________________________ eSigned:  R. Roetta Sessions, MD FACP [facility] 09/08/2012 11:30 AM   CC:

## 2012-09-08 NOTE — Interval H&P Note (Signed)
History and Physical Interval Note:  09/08/2012 10:55 AM  [name]  has presented today for surgery, with the diagnosis of SCREENING COLONOSCOPY  The various methods of treatment have been discussed with the patient and family. After consideration of risks, benefits and other options for treatment, the patient has consented to  Procedure(s) with comments: COLONOSCOPY (N/A) - 10:30 as a surgical intervention .  The patient's history has been reviewed, patient examined, no change in status, stable for surgery.  I have reviewed the patient's chart and labs.  Questions were answered to the patient's satisfaction.     Eula Listen Screening colonoscopy today per plan.The risks, benefits, limitations, alternatives and imponderables have been reviewed with the patient. Questions have been answered. All parties are agreeable.

## 2012-09-11 ENCOUNTER — Encounter (HOSPITAL_COMMUNITY): Payer: Self-pay | Admitting: Internal Medicine

## 2012-09-19 ENCOUNTER — Encounter: Payer: Self-pay | Admitting: Internal Medicine

## 2012-10-14 ENCOUNTER — Encounter: Payer: Self-pay | Admitting: Orthopedic Surgery

## 2012-10-14 ENCOUNTER — Ambulatory Visit (INDEPENDENT_AMBULATORY_CARE_PROVIDER_SITE_OTHER): Payer: Medicare Other | Admitting: Orthopedic Surgery

## 2012-10-14 VITALS — BP 122/84 | Ht 69.5 in | Wt 250.0 lb

## 2012-10-14 DIAGNOSIS — M23322 Other meniscus derangements, posterior horn of medial meniscus, left knee: Secondary | ICD-10-CM

## 2012-10-14 DIAGNOSIS — M23329 Other meniscus derangements, posterior horn of medial meniscus, unspecified knee: Secondary | ICD-10-CM

## 2012-10-14 MED ORDER — OXYCODONE-ACETAMINOPHEN 5-325 MG PO TABS: 1.0000 | ORAL_TABLET | ORAL | Status: DC | PRN

## 2012-10-14 NOTE — Progress Notes (Signed)
Patient ID: RANDAL GOENS, male   DOB: [birth year], 72 y.o.   MRN: [id number] Chief Complaint  Patient presents with  . Knee Pain    Left knee pain    72 year old male with increasing knee pain history lesser arthritis of the left knee continues with severe medial knee pain worse over last month or so. He did take some prednisone because of history of gout did not help he would like some medicine for his knee and further evaluation  He does report some catching and locking in the left knee his x-ray shows what was a moderate arthritis nothing severe  His exam shows that he has tenderness over the medial joint line of the left knee, quite exquisite. His range of motion is limited with a 10 flexion contracture and about 120 of knee flexion. The ligaments are stable the muscle tone is normal the strength is normal skin is intact he has normal sensation he does have a McMurray sign  Recommend MRI left knee to evaluate for surgical intervention with arthroscopy and medial meniscectomy

## 2012-10-14 NOTE — Patient Instructions (Signed)
You have been scheduled for an MRI scan.  Your insurance company requires a precertification prior to scheduling the MRI.  If the MRI scan is not approved we will let you know and make further treatment recommendations according to your insurance's guidelines.   We will schedule you for another  appointment to review the results and make further treatment recommendations  

## 2012-10-21 ENCOUNTER — Telehealth: Payer: Self-pay | Admitting: Radiology

## 2012-10-21 NOTE — Telephone Encounter (Signed)
Patient has MRI at [facility] on 10-23-12 at 1:45. Patient has [employer], [place], authorization # [id number] and it expires on 12-05-12. Patient will follow up in the office for his results with Dr. Romeo Apple.

## 2012-10-23 ENCOUNTER — Ambulatory Visit (HOSPITAL_COMMUNITY)
Admission: RE | Admit: 2012-10-23 | Discharge: 2012-10-23 | Disposition: A | Discharge status: Home / Self Care | Payer: Medicare Other | Source: Ambulatory Visit | Attending: Orthopedic Surgery | Admitting: Orthopedic Surgery

## 2012-10-23 DIAGNOSIS — M25569 Pain in unspecified knee: Secondary | ICD-10-CM | POA: Insufficient documentation

## 2012-10-23 DIAGNOSIS — M23322 Other meniscus derangements, posterior horn of medial meniscus, left knee: Secondary | ICD-10-CM

## 2012-10-23 DIAGNOSIS — M23302 Other meniscus derangements, unspecified lateral meniscus, unspecified knee: Secondary | ICD-10-CM | POA: Insufficient documentation

## 2012-10-23 DIAGNOSIS — IMO0002 Reserved for concepts with insufficient information to code with codable children: Secondary | ICD-10-CM | POA: Insufficient documentation

## 2012-10-23 DIAGNOSIS — X58XXXA Exposure to other specified factors, initial encounter: Secondary | ICD-10-CM | POA: Insufficient documentation

## 2012-10-27 ENCOUNTER — Other Ambulatory Visit: Payer: Self-pay | Admitting: Orthopedic Surgery

## 2012-10-27 ENCOUNTER — Encounter: Payer: Self-pay | Admitting: Orthopedic Surgery

## 2012-11-07 ENCOUNTER — Encounter (HOSPITAL_COMMUNITY): Payer: Self-pay | Admitting: Pharmacy Technician

## 2012-11-12 ENCOUNTER — Telehealth: Payer: Self-pay | Admitting: Orthopedic Surgery

## 2012-11-12 NOTE — Telephone Encounter (Signed)
Contacted insurer, [employer], ph# [phone], regarding out-patient surgery scheduled for 11/21/12 at [facility], CPT [phone], [phone].  Per Scheryl Marten, intake department, 3:36pm, no pre-authorization required for in-network providers for this procedure.

## 2012-11-14 NOTE — Patient Instructions (Addendum)
[name]  11/14/2012   Your procedure is scheduled on:   11/21/2012   Report to [facility] at  615  AM.  Call this number if you have problems the morning of surgery: [phone]   Remember:   Do not eat food or drink liquids after midnight.   Take these medicines the morning of surgery with A SIP OF WATER: levothyroxine,lisinopril,cellcept, prednisone, prograf   Do not wear jewelry, make-up or nail polish.  Do not wear lotions, powders, or perfumes.   Do not shave 48 hours prior to surgery. Men may shave face and neck.  Do not bring valuables to the hospital.  Contacts, dentures or bridgework may not be worn into surgery.  Leave suitcase in the car. After surgery it may be brought to your room.  For patients admitted to the hospital, checkout time is 11:00 AM the day of discharge.   Patients discharged the day of surgery will not be allowed to drive  home.  Name and phone number of your driver: family Special Instructions: Shower using CHG 2 nights before surgery and the night before surgery.  If you shower the day of surgery use CHG.  Use special wash - you have one bottle of CHG for all showers.  You should use approximately 1/3 of the bottle for each shower.   Please read over the following fact sheets that you were given: Pain Booklet, Coughing and Deep Breathing, MRSA Information, Surgical Site Infection Prevention, Anesthesia Post-op Instructions and Care and Recovery After Surgery Arthroscopic Procedure, Knee An arthroscopic procedure can find what is wrong with your knee. PROCEDURE Arthroscopy is a surgical technique that allows your orthopedic surgeon to diagnose and treat your knee injury with accuracy. They will look into your knee through a small instrument. This is almost like a small (pencil sized) telescope. Because arthroscopy affects your knee less than open knee surgery, you can anticipate a more rapid recovery. Taking an active role by following your  caregiver's instructions will help with rapid and complete recovery. Use crutches, rest, elevation, ice, and knee exercises as instructed. The length of recovery depends on various factors including type of injury, age, physical condition, medical conditions, and your rehabilitation. Your knee is the joint between the large bones (femur and tibia) in your leg. Cartilage covers these bone ends which are smooth and slippery and allow your knee to bend and move smoothly. Two menisci, thick, semi-lunar shaped pads of cartilage which form a rim inside the joint, help absorb shock and stabilize your knee. Ligaments bind the bones together and support your knee joint. Muscles move the joint, help support your knee, and take stress off the joint itself. Because of this all programs and physical therapy to rehabilitate an injured or repaired knee require rebuilding and strengthening your muscles. AFTER THE PROCEDURE  After the procedure, you will be moved to a recovery area until most of the effects of the medication have worn off. Your caregiver will discuss the test results with you.  Only take over-the-counter or prescription medicines for pain, discomfort, or fever as directed by your caregiver. SEEK MEDICAL CARE IF:   You have increased bleeding from your wounds.  You see redness, swelling, or have increasing pain in your wounds.  You have pus coming from your wound.  You have an oral temperature above 102 F (38.9 C).  You notice a bad smell coming from the wound or dressing.  You have severe pain  with any motion of your knee. SEEK IMMEDIATE MEDICAL CARE IF:   You develop a rash.  You have difficulty breathing.  You have any allergic problems. Document Released: 06/22/2000 Document Revised: 09/17/2011 Document Reviewed: 01/14/2008 [facility] Patient Information 2013 [place], [place]. PATIENT INSTRUCTIONS POST-ANESTHESIA  IMMEDIATELY FOLLOWING SURGERY:  Do not drive or operate machinery for  the first twenty four hours after surgery.  Do not make any important decisions for twenty four hours after surgery or while taking narcotic pain medications or sedatives.  If you develop intractable nausea and vomiting or a severe headache please notify your doctor immediately.  FOLLOW-UP:  Please make an appointment with your surgeon as instructed. You do not need to follow up with anesthesia unless specifically instructed to do so.  WOUND CARE INSTRUCTIONS (if applicable):  Keep a dry clean dressing on the anesthesia/puncture wound site if there is drainage.  Once the wound has quit draining you may leave it open to air.  Generally you should leave the bandage intact for twenty four hours unless there is drainage.  If the epidural site drains for more than 36-48 hours please call the anesthesia department.  QUESTIONS?:  Please feel free to call your physician or the hospital operator if you have any questions, and they will be happy to assist you.

## 2012-11-17 ENCOUNTER — Encounter (HOSPITAL_COMMUNITY)
Admission: RE | Admit: 2012-11-17 | Discharge: 2012-11-17 | Disposition: A | Discharge status: Home / Self Care | Payer: Medicare Other | Source: Ambulatory Visit | Attending: Orthopedic Surgery | Admitting: Orthopedic Surgery

## 2012-11-17 ENCOUNTER — Encounter (HOSPITAL_COMMUNITY): Payer: Self-pay

## 2012-11-17 LAB — BASIC METABOLIC PANEL
BUN: 25 mg/dL — ABNORMAL HIGH (ref 6–23)
CO2: 26 mEq/L (ref 19–32)
Calcium: 9.5 mg/dL (ref 8.4–10.5)
GFR calc non Af Amer: 47 mL/min — ABNORMAL LOW (ref >90)
Glucose, Bld: 105 mg/dL — ABNORMAL HIGH (ref 70–99)

## 2012-11-17 NOTE — Progress Notes (Signed)
11/17/12 0959  OBSTRUCTIVE SLEEP APNEA  Have you ever been diagnosed with sleep apnea through a sleep study? No  Do you snore loudly (loud enough to be heard through closed doors)?  1  Do you often feel tired, fatigued, or sleepy during the daytime? 0  Has anyone observed you stop breathing during your sleep? 0  Do you have, or are you being treated for high blood pressure? 1  BMI more than 35 kg/m2? 1  Age over 72 years old? 1  Neck circumference greater than 40 cm/18 inches? 1  Obstructive Sleep Apnea Score 5

## 2012-11-17 NOTE — Progress Notes (Signed)
1048 Spoke to Dr. Marcos Eke about patient taking Levemir 30 units SQ at bedtime. Patient stated he usually runs high.Patient instructed to eat snack before bedtime. Dr. Marcos Eke stated he can take full dose Levemir at bedtime night before surgery. Dr. Marcos Eke also stated patient can discontinue Aspirin 81mg  today (used as a preventive; not ordered by cardiology).

## 2012-11-20 ENCOUNTER — Ambulatory Visit: Payer: Medicare Other | Admitting: Orthopedic Surgery

## 2012-11-20 NOTE — H&P (Addendum)
[name] is an 72 y.o. male.   Knee Pain     Left knee pain   72 year old male with increasing knee pain history lesser arthritis of the left knee continues with severe medial knee pain worse over last month or so. He did take some prednisone because of history of gout did not help he would like some medicine for his knee and further evaluation  He does report some catching and locking in the left knee his x-ray shows what was a moderate arthritis nothing severe  His exam shows that he has tenderness over the medial joint line of the left knee, quite exquisite. His range of motion is limited with a 10 flexion contracture and about 120 of knee flexion. The ligaments are stable the muscle tone is normal the strength is normal skin is intact he has normal sensation he does have a McMurray sign MRI reviwed shows medial meniscus torn and OA knee moderate   Past Medical History  Diagnosis Date  . Hypothyroidism   . HTN (hypertension)   . CRI (chronic renal insufficiency)   . DM (diabetes mellitus)     Type II  . Anemia   . NASH (nonalcoholic steatohepatitis) 2006    liver transplant  . Squamous cell cancer of external ear     pinna  . S/P colonoscopy with polypectomy 2004    Dr Jena Gauss  . S/P endoscopy 03/2004    Hx esophgaeal varices, pyloric channel ulcer prior to transplant  . History of ERCP 03/21/2011    Extrahepatic biliary obstruction secondary to occluded biliary stent and colon no cholelithiasis, status post sphincterotomy, stent removal and replacement and stone extraction.  . Biliary calculi, common bile duct   . Esophageal varices 03/19/11    mrcp  . Pancreatitis chronic 03/19/11    mrcp  . Elevated liver enzymes     Past Surgical History  Procedure Laterality Date  . Liver transplant  2006    UVA-NASH cirrhosis  . Mouth surgery  may 2012  . Ercp  03/21/2011    Dr. Jena Gauss- ERCP with removal of pediatric feeding tube, endoscopic sphinecterotomy with balloon dilation of  ampulla followed by stone extraction and stent placement  . Sphincterotomy  03/21/2011    Procedure: SPHINCTEROTOMY;  Surgeon: Corbin Ade, MD;  Location: [facility];  Service: Gastroenterology;;  . Mrcp  03/19/11    biliary ductal dilatation- stones. esophageal varices  . Bile duct stent placement  03/21/11    Dr. Jena Gauss  . Esophagogastroduodenoscopy  03/20/2004    Dr. Jolyn Nap 2 esophageal varices o/w normal esophageal mucosa, mild changes of snake skin in the gastric mucosa consistent with portal gastropathy, multiple antral erosions  and  a single pyloric channel ulcer benign appearance o/w normal gastric mucosa, normal D1 and S2  . Colonoscopy  08/12/2002    Dr. Jena Gauss- somewhat diffusely friable rectum and colon, polyp at 30 cm.  . Colonoscopy N/A 09/08/2012    Procedure: COLONOSCOPY;  Surgeon: Corbin Ade, MD;  Location: [facility];  Service: Endoscopy;  Laterality: N/A;  10:30  . Cholecystectomy  2006    Family History  Problem Relation Age of Onset  . Cirrhosis Father     nash  . Lymphoma Mother   . Diabetes Daughter    Social History:  reports that he has never smoked. He has never used smokeless tobacco. He reports that he does not drink alcohol or use illicit drugs.  Allergies:  Allergies  Allergen Reactions  .  Insulin Glargine Rash    Itching, reaction required epinephrine shot  . Contrast Media (Iodinated Diagnostic Agents) Rash    No prescriptions prior to admission    No results found for this or any previous visit (from the past 48 hour(s)). No results found.  Review of Systems  Musculoskeletal: Positive for joint pain.  All other systems reviewed and are negative.    There were no vitals taken for this visit. Physical Exam   General appearance is normal, the patient is alert and oriented x3 with normal mood and affect. Gait slight reality abnormality  Upper extremity exam  The right and left upper extremity:   Inspection and palpation revealed  no abnormalities in the upper extremities.   Range of motion is full without contracture.  Motor exam is normal with grade 5 strength.  The joints are fully reduced without subluxation.  There is no atrophy or tremor and muscle tone is normal.  All joints are stable.   Right lower extremity inspection reveals no gross abnormalities, range of motion is full muscle strength is normal joints are reduced without subluxation no atrophy noted  Left knee tenderness over the medial joint line of the left knee, quite exquisite. His range of motion is limited with a 10 flexion contracture and about 120 of knee flexion. The ligaments are stable the muscle tone is normal the strength is normal skin is intact he has normal sensation he does have a McMurray sign   Assessment/Plan Torn medial meniscus left knee  Left knee arthroscopy partial medial meniscectomy  Fuller Canada 11/20/2012, 1:51 PM  IMPRESSION:  1. Complex tear of the medial meniscus body and posterior horn,  bordering on maceration. Large parameniscal cysts around the body  and posterior horn.  2. Moderate patellofemoral and medial compartment osteoarthritis.  3. Low signal fragment in the suprapatellar pouch probably  represents either cartilage or meniscal fragment.

## 2012-11-21 ENCOUNTER — Ambulatory Visit (HOSPITAL_COMMUNITY)
Admission: RE | Admit: 2012-11-21 | Discharge: 2012-11-21 | Disposition: A | Discharge status: Home / Self Care | Payer: Medicare Other | Source: Ambulatory Visit | Attending: Orthopedic Surgery | Admitting: Orthopedic Surgery

## 2012-11-21 ENCOUNTER — Encounter (HOSPITAL_COMMUNITY): Payer: Self-pay | Admitting: Anesthesiology

## 2012-11-21 ENCOUNTER — Encounter (HOSPITAL_COMMUNITY)
Admission: RE | Disposition: A | Discharge status: Home / Self Care | Payer: Self-pay | Source: Ambulatory Visit | Attending: Orthopedic Surgery

## 2012-11-21 ENCOUNTER — Ambulatory Visit (HOSPITAL_COMMUNITY): Payer: Medicare Other | Admitting: Anesthesiology

## 2012-11-21 ENCOUNTER — Encounter (HOSPITAL_COMMUNITY): Payer: Self-pay | Admitting: *Deleted

## 2012-11-21 DIAGNOSIS — M23301 Other meniscus derangements, unspecified lateral meniscus, left knee: Secondary | ICD-10-CM

## 2012-11-21 DIAGNOSIS — E119 Type 2 diabetes mellitus without complications: Secondary | ICD-10-CM | POA: Insufficient documentation

## 2012-11-21 DIAGNOSIS — M659 Unspecified synovitis and tenosynovitis, unspecified site: Secondary | ICD-10-CM | POA: Insufficient documentation

## 2012-11-21 DIAGNOSIS — Z944 Liver transplant status: Secondary | ICD-10-CM | POA: Insufficient documentation

## 2012-11-21 DIAGNOSIS — M23329 Other meniscus derangements, posterior horn of medial meniscus, unspecified knee: Secondary | ICD-10-CM

## 2012-11-21 DIAGNOSIS — Z85828 Personal history of other malignant neoplasm of skin: Secondary | ICD-10-CM | POA: Insufficient documentation

## 2012-11-21 DIAGNOSIS — M23302 Other meniscus derangements, unspecified lateral meniscus, unspecified knee: Secondary | ICD-10-CM

## 2012-11-21 DIAGNOSIS — IMO0002 Reserved for concepts with insufficient information to code with codable children: Secondary | ICD-10-CM | POA: Insufficient documentation

## 2012-11-21 DIAGNOSIS — Z794 Long term (current) use of insulin: Secondary | ICD-10-CM | POA: Insufficient documentation

## 2012-11-21 DIAGNOSIS — M23322 Other meniscus derangements, posterior horn of medial meniscus, left knee: Secondary | ICD-10-CM

## 2012-11-21 DIAGNOSIS — K7689 Other specified diseases of liver: Secondary | ICD-10-CM | POA: Insufficient documentation

## 2012-11-21 DIAGNOSIS — E669 Obesity, unspecified: Secondary | ICD-10-CM | POA: Insufficient documentation

## 2012-11-21 DIAGNOSIS — M6750 Plica syndrome, unspecified knee: Secondary | ICD-10-CM | POA: Insufficient documentation

## 2012-11-21 DIAGNOSIS — I129 Hypertensive chronic kidney disease with stage 1 through stage 4 chronic kidney disease, or unspecified chronic kidney disease: Secondary | ICD-10-CM | POA: Insufficient documentation

## 2012-11-21 DIAGNOSIS — Z91041 Radiographic dye allergy status: Secondary | ICD-10-CM | POA: Insufficient documentation

## 2012-11-21 DIAGNOSIS — D649 Anemia, unspecified: Secondary | ICD-10-CM | POA: Insufficient documentation

## 2012-11-21 DIAGNOSIS — S83289A Other tear of lateral meniscus, current injury, unspecified knee, initial encounter: Secondary | ICD-10-CM | POA: Insufficient documentation

## 2012-11-21 DIAGNOSIS — M1710 Unilateral primary osteoarthritis, unspecified knee: Secondary | ICD-10-CM | POA: Insufficient documentation

## 2012-11-21 DIAGNOSIS — K759 Inflammatory liver disease, unspecified: Secondary | ICD-10-CM | POA: Insufficient documentation

## 2012-11-21 DIAGNOSIS — E039 Hypothyroidism, unspecified: Secondary | ICD-10-CM | POA: Insufficient documentation

## 2012-11-21 DIAGNOSIS — Z888 Allergy status to other drugs, medicaments and biological substances status: Secondary | ICD-10-CM | POA: Insufficient documentation

## 2012-11-21 DIAGNOSIS — Z6837 Body mass index (BMI) 37.0-37.9, adult: Secondary | ICD-10-CM | POA: Insufficient documentation

## 2012-11-21 DIAGNOSIS — N189 Chronic kidney disease, unspecified: Secondary | ICD-10-CM | POA: Insufficient documentation

## 2012-11-21 DIAGNOSIS — X58XXXA Exposure to other specified factors, initial encounter: Secondary | ICD-10-CM | POA: Insufficient documentation

## 2012-11-21 DIAGNOSIS — M179 Osteoarthritis of knee, unspecified: Secondary | ICD-10-CM

## 2012-11-21 HISTORY — PX: KNEE ARTHROSCOPY WITH MEDIAL MENISECTOMY: SHX5651

## 2012-11-21 HISTORY — PX: KNEE ARTHROSCOPY WITH EXCISION PLICA: SHX5647

## 2012-11-21 SURGERY — ARTHROSCOPY, KNEE, WITH MEDIAL MENISCECTOMY
Anesthesia: Spinal | Site: Knee | Laterality: Left | Wound class: Clean

## 2012-11-21 MED ORDER — FENTANYL CITRATE 0.05 MG/ML IJ SOLN
INTRAMUSCULAR | Status: AC
  Filled 2012-11-21: qty 5

## 2012-11-21 MED ORDER — CEFAZOLIN SODIUM-DEXTROSE 2-3 GM-% IV SOLR
INTRAVENOUS | Status: AC
  Filled 2012-11-21: qty 50

## 2012-11-21 MED ORDER — PROPOFOL 10 MG/ML IV EMUL
INTRAVENOUS | Status: AC
  Filled 2012-11-21: qty 20

## 2012-11-21 MED ORDER — SODIUM CHLORIDE 0.9 % IR SOLN
Status: DC | PRN
  Administered 2012-11-21: 1000 mL

## 2012-11-21 MED ORDER — FENTANYL CITRATE 0.05 MG/ML IJ SOLN: 25.0000 ug | INTRAMUSCULAR | Status: DC | PRN

## 2012-11-21 MED ORDER — DEXTROSE 5 % IV SOLN
3.0000 g | INTRAVENOUS | Status: AC
  Administered 2012-11-21: 3 g via INTRAVENOUS
  Filled 2012-11-21: qty 3000

## 2012-11-21 MED ORDER — CHLORHEXIDINE GLUCONATE 4 % EX LIQD: 60.0000 mL | Freq: Once | CUTANEOUS | Status: DC

## 2012-11-21 MED ORDER — PHENYLEPHRINE HCL 10 MG/ML IJ SOLN
INTRAMUSCULAR | Status: AC
  Filled 2012-11-21: qty 1

## 2012-11-21 MED ORDER — BUPIVACAINE-EPINEPHRINE PF 0.5-1:200000 % IJ SOLN
INTRAMUSCULAR | Status: AC
  Filled 2012-11-21: qty 20

## 2012-11-21 MED ORDER — EPINEPHRINE HCL 1 MG/ML IJ SOLN
INTRAMUSCULAR | Status: AC
  Filled 2012-11-21: qty 4

## 2012-11-21 MED ORDER — ONDANSETRON HCL 4 MG/2ML IJ SOLN
4.0000 mg | Freq: Once | INTRAMUSCULAR | Status: AC
  Administered 2012-11-21: 4 mg via INTRAVENOUS

## 2012-11-21 MED ORDER — ONDANSETRON HCL 4 MG/2ML IJ SOLN
INTRAMUSCULAR | Status: AC
  Filled 2012-11-21: qty 2

## 2012-11-21 MED ORDER — BUPIVACAINE-EPINEPHRINE PF 0.5-1:200000 % IJ SOLN
INTRAMUSCULAR | Status: DC | PRN
  Administered 2012-11-21: 60 mL

## 2012-11-21 MED ORDER — FENTANYL CITRATE 0.05 MG/ML IJ SOLN
INTRAMUSCULAR | Status: DC | PRN
  Administered 2012-11-21: 20 ug via INTRATHECAL
  Administered 2012-11-21: 25 ug via INTRAVENOUS

## 2012-11-21 MED ORDER — GLYCOPYRROLATE 0.2 MG/ML IJ SOLN
INTRAMUSCULAR | Status: AC
  Filled 2012-11-21: qty 1

## 2012-11-21 MED ORDER — LACTATED RINGERS IV SOLN
INTRAVENOUS | Status: DC
  Administered 2012-11-21: 07:00:00 via INTRAVENOUS

## 2012-11-21 MED ORDER — PHENYLEPHRINE HCL 10 MG/ML IJ SOLN
INTRAMUSCULAR | Status: DC | PRN
  Administered 2012-11-21 (×5): 100 ug via INTRAVENOUS

## 2012-11-21 MED ORDER — MIDAZOLAM HCL 2 MG/2ML IJ SOLN
1.0000 mg | INTRAMUSCULAR | Status: DC | PRN
  Administered 2012-11-21: 2 mg via INTRAVENOUS

## 2012-11-21 MED ORDER — SODIUM CHLORIDE 0.9 % IR SOLN
Status: DC | PRN
  Administered 2012-11-21 (×3)

## 2012-11-21 MED ORDER — MIDAZOLAM HCL 2 MG/2ML IJ SOLN
INTRAMUSCULAR | Status: AC
  Filled 2012-11-21: qty 2

## 2012-11-21 MED ORDER — OXYCODONE-ACETAMINOPHEN 5-325 MG PO TABS
1.0000 | ORAL_TABLET | ORAL | Status: DC | PRN
  Administered 2012-11-21: 1 via ORAL

## 2012-11-21 MED ORDER — EPHEDRINE SULFATE 50 MG/ML IJ SOLN
INTRAMUSCULAR | Status: DC | PRN
  Administered 2012-11-21: 10 mg via INTRAVENOUS
  Administered 2012-11-21: 5 mg via INTRAVENOUS

## 2012-11-21 MED ORDER — GLYCOPYRROLATE 0.2 MG/ML IJ SOLN
0.2000 mg | Freq: Once | INTRAMUSCULAR | Status: AC
  Administered 2012-11-21: 0.2 mg via INTRAVENOUS

## 2012-11-21 MED ORDER — PROPOFOL INFUSION 10 MG/ML OPTIME
INTRAVENOUS | Status: DC | PRN
  Administered 2012-11-21: 25 ug/kg/min via INTRAVENOUS

## 2012-11-21 MED ORDER — ONDANSETRON HCL 4 MG/2ML IJ SOLN: 4.0000 mg | Freq: Once | INTRAMUSCULAR | Status: DC | PRN

## 2012-11-21 MED ORDER — OXYCODONE-ACETAMINOPHEN 5-325 MG PO TABS
ORAL_TABLET | ORAL | Status: AC
  Filled 2012-11-21: qty 1

## 2012-11-21 MED ORDER — CEFAZOLIN SODIUM 1-5 GM-% IV SOLN
INTRAVENOUS | Status: AC
  Filled 2012-11-21: qty 50

## 2012-11-21 MED ORDER — EPHEDRINE SULFATE 50 MG/ML IJ SOLN
INTRAMUSCULAR | Status: AC
  Filled 2012-11-21: qty 1

## 2012-11-21 MED ORDER — LIDOCAINE IN DEXTROSE 5-7.5 % IV SOLN
INTRAVENOUS | Status: DC | PRN
  Administered 2012-11-21: 75 mg via INTRATHECAL

## 2012-11-21 MED ORDER — OXYCODONE-ACETAMINOPHEN 5-325 MG PO TABS: 1.0000 | ORAL_TABLET | ORAL | Status: DC | PRN

## 2012-11-21 SURGICAL SUPPLY — 43 items
BAG HAMPER (MISCELLANEOUS) ×2 IMPLANT
BLADE AGGRESSIVE PLUS 4.0 (BLADE) ×2 IMPLANT
BLADE SURG SZ11 CARB STEEL (BLADE) ×2 IMPLANT
CHLORAPREP W/TINT 26ML (MISCELLANEOUS) ×4 IMPLANT
CLOTH BEACON ORANGE TIMEOUT ST (SAFETY) ×2 IMPLANT
COOLER CRYO IC GRAV AND TUBE (ORTHOPEDIC SUPPLIES) ×2 IMPLANT
CUFF CRYO KNEE18X23 MED (MISCELLANEOUS) ×2 IMPLANT
CUFF TOURNIQUET SINGLE 34IN LL (TOURNIQUET CUFF) ×2 IMPLANT
DECANTER SPIKE VIAL GLASS SM (MISCELLANEOUS) ×4 IMPLANT
GAUZE SPONGE 4X4 16PLY XRAY LF (GAUZE/BANDAGES/DRESSINGS) ×2 IMPLANT
GAUZE XEROFORM 5X9 LF (GAUZE/BANDAGES/DRESSINGS) ×2 IMPLANT
GLOVE BIOGEL PI IND STRL 7.0 (GLOVE) ×2 IMPLANT
GLOVE BIOGEL PI INDICATOR 7.0 (GLOVE) ×2
GLOVE EXAM NITRILE MD LF STRL (GLOVE) ×2 IMPLANT
GLOVE SKINSENSE NS SZ8.0 LF (GLOVE) ×1
GLOVE SKINSENSE STRL SZ8.0 LF (GLOVE) ×1 IMPLANT
GLOVE SS BIOGEL STRL SZ 6.5 (GLOVE) ×1 IMPLANT
GLOVE SS N UNI LF 8.5 STRL (GLOVE) ×2 IMPLANT
GLOVE SUPERSENSE BIOGEL SZ 6.5 (GLOVE) ×1
GOWN STRL REIN XL XLG (GOWN DISPOSABLE) ×6 IMPLANT
HLDR LEG FOAM (MISCELLANEOUS) ×1 IMPLANT
IV NS IRRIG 3000ML ARTHROMATIC (IV SOLUTION) ×6 IMPLANT
KIT BLADEGUARD II DBL (SET/KITS/TRAYS/PACK) ×2 IMPLANT
KIT ROOM TURNOVER AP CYSTO (KITS) ×2 IMPLANT
LEG HOLDER FOAM (MISCELLANEOUS) ×1
MANIFOLD NEPTUNE II (INSTRUMENTS) ×2 IMPLANT
MARKER SKIN DUAL TIP RULER LAB (MISCELLANEOUS) ×2 IMPLANT
NEEDLE HYPO 18GX1.5 BLUNT FILL (NEEDLE) ×2 IMPLANT
NEEDLE HYPO 21X1.5 SAFETY (NEEDLE) ×2 IMPLANT
NEEDLE SPNL 18GX3.5 QUINCKE PK (NEEDLE) ×2 IMPLANT
NS IRRIG 1000ML POUR BTL (IV SOLUTION) ×2 IMPLANT
PACK ARTHRO LIMB DRAPE STRL (MISCELLANEOUS) ×2 IMPLANT
PAD ABD 5X9 TENDERSORB (GAUZE/BANDAGES/DRESSINGS) ×2 IMPLANT
PAD ARMBOARD 7.5X6 YLW CONV (MISCELLANEOUS) ×2 IMPLANT
PADDING CAST COTTON 6X4 STRL (CAST SUPPLIES) ×2 IMPLANT
SET ARTHROSCOPY INST (INSTRUMENTS) ×2 IMPLANT
SET ARTHROSCOPY PUMP TUBE (IRRIGATION / IRRIGATOR) ×2 IMPLANT
SET BASIN LINEN APH (SET/KITS/TRAYS/PACK) ×2 IMPLANT
SUT ETHILON 3 0 FSL (SUTURE) ×2 IMPLANT
SYR 30ML LL (SYRINGE) ×2 IMPLANT
SYRINGE 10CC LL (SYRINGE) ×2 IMPLANT
WAND 50 DEG COVAC W/CORD (SURGICAL WAND) ×2 IMPLANT
YANKAUER SUCT BULB TIP 10FT TU (MISCELLANEOUS) ×8 IMPLANT

## 2012-11-21 NOTE — Anesthesia Procedure Notes (Addendum)
Spinal  Patient location during procedure: OR Start time: 11/21/2012 7:59 AM Staffing CRNA/Resident: Glynn Octave E Preanesthetic Checklist Completed: patient identified, site marked, surgical consent, pre-op evaluation, timeout performed, IV checked, risks and benefits discussed and monitors and equipment checked Spinal Block Patient position: left lateral decubitus Prep: Betadine Patient monitoring: heart rate, cardiac monitor, continuous pulse ox and blood pressure Approach: left paramedian Location: L3-4 Injection technique: single-shot Needle Needle type: Quincke  Needle gauge: 22 G Needle length: 12.7 cm Assessment Sensory level: T8 Additional Notes  ATTEMPTS:2 TRAY ZO:[id number] TRAY EXPIRATION DATE:06/2013

## 2012-11-21 NOTE — Brief Op Note (Addendum)
11/21/2012  9:00 AM  PATIENT:  [name]  71 y.o. male  PRE-OPERATIVE DIAGNOSIS:  torn medial meniscus left knee  POST-OPERATIVE DIAGNOSIS:  torn medial and lateral meniscus left knee; medial plica; osteoarthritis  FINDINGS:  TORN MEDIAL MENISCUS  TORN LATERAL MENISCUS  MEDIAL PLICA MODERATE SYNOVITIS MODERATE OSTEOARTHRITIS   PROCEDURE:  Procedure(s): KNEE ARTHROSCOPY WITH MEDIAL AND LATERAL MENISECTOMY (Left) KNEE ARTHROSCOPY WITH EXCISION PLICA (Left)  DICTATION:  The patient was identified in the preop holding area and the left knee was confirmed as a surgical site. The knee was signed. The consent was reviewed and updated. The chart was updated.  The patient was taken to the operating room for spinal anesthetic. He was given 3 g of IV Ancef per protocol. The left knee was placed in an arthroscopic leg holder the right knee was placed in a heavily padded device.  The left knee was then prepped and draped sterilely.  The timeout was completed.  Standard lateral portal was established a diagnostic arthroscopy was performed from the lateral portal. The knee was evaluated circumferentially. The findings were as follows. The posterior horn of the medial meniscus was torn there was a great 3 lesion of the medial femoral condyle. The anterior cruciate ligament was intact. There was a tear the posterior horn the lateral meniscus. There was grade 2 lesion of the trochlea. There is a moderate amount of synovitis.  And medial portal was established and a probe was placed into the joint to further evaluate the intra-articular structures. The meniscal tear was defined. The arthroscopic shaver was used to resect the torn meniscal tissue and this was further debrided and balanced with an arthroscopic wand.  A shaver was then used to perform a chondroplasty of the medial femoral condyle and resect synovial tissue including the medial plica.  The knee was then lavaged. We injected Marcaine  with epinephrine.  The portals were closed with 3-0 nylon and the knee was dressed sterilely.  A Cryo/Cuff was placed and activated.  SURGEON:  Surgeon(s) and Role:    * Lizvet Chunn E Ethelda Deangelo, MD - Primary  PHYSICIAN ASSISTANT:   ASSISTANTS: none   ANESTHESIA:   spinal  EBL:  Total I/O In: 500 [I.V.:500] Out: 0   BLOOD ADMINISTERED:none  DRAINS: none   LOCAL MEDICATIONS USED:  MARCAINE 0.5%   and Amount: 60 ml  SPECIMEN:  No Specimen  DISPOSITION OF SPECIMEN:  N/A  COUNTS:  YES  TOURNIQUET:    DICTATION: .Dragon Dictation  PLAN OF CARE: Discharge to home after PACU  PATIENT DISPOSITION:  PACU stable    Delay start of Pharmacological VTE agent (>24hrs) due to surgical blood loss or risk of bleeding: not applicable 

## 2012-11-21 NOTE — Anesthesia Postprocedure Evaluation (Signed)
  Anesthesia Post-op Note  Patient: [name]  Procedure(s) Performed: Procedure(s): KNEE ARTHROSCOPY WITH MEDIAL AND LATERAL MENISECTOMY (Left) KNEE ARTHROSCOPY WITH EXCISION PLICA (Left)  Patient Location: PACU  Anesthesia Type:Spinal  Level of Consciousness: awake, alert  and oriented  Airway and Oxygen Therapy: Patient Spontanous Breathing  Post-op Pain: none  Post-op Assessment: Post-op Vital signs reviewed, Patient's Cardiovascular Status Stable, Respiratory Function Stable, Patent Airway and No signs of Nausea or vomiting  Post-op Vital Signs: Reviewed  Complications: No apparent anesthesia complications

## 2012-11-21 NOTE — Anesthesia Preprocedure Evaluation (Signed)
Anesthesia Evaluation  Patient identified by MRN, date of birth, ID band Patient awake    Reviewed: Allergy & Precautions, H&P , NPO status , Patient's Chart, lab work & pertinent test results  History of Anesthesia Complications Negative for: history of anesthetic complications  Airway Mallampati: I TM Distance: >3 FB Neck ROM: Full    Dental no notable dental hx. (+) Teeth Intact   Pulmonary neg pulmonary ROS,    Pulmonary exam normal       Cardiovascular hypertension, Pt. on medications Rhythm:Regular Rate:Normal     Neuro/Psych negative neurological ROS  negative psych ROS   GI/Hepatic (+) Hepatitis - (NASH, s/p Transplant)  Endo/Other  diabetes, Type 2, Insulin DependentHypothyroidism   Renal/GU CRFRenal disease     Musculoskeletal   Abdominal (+) + obese,   Peds  Hematology negative hematology ROS (+)   Anesthesia Other Findings   Reproductive/Obstetrics                           Anesthesia Physical Anesthesia Plan  ASA: III  Anesthesia Plan: Spinal   Post-op Pain Management:    Induction: Intravenous  Airway Management Planned: Nasal Cannula  Additional Equipment:   Intra-op Plan:   Post-operative Plan:   Informed Consent: I have reviewed the patients History and Physical, chart, labs and discussed the procedure including the risks, benefits and alternatives for the proposed anesthesia with the patient or authorized representative who has indicated his/her understanding and acceptance.     Plan Discussed with: CRNA  Anesthesia Plan Comments:         Anesthesia Quick Evaluation

## 2012-11-21 NOTE — Progress Notes (Signed)
In postop on stretcher. Sensation level to bilateral toes. Unable to raise hips off bed. Diet coke and grahman crackers given. Tolerated well. Wife at side. Will continue to monitor.

## 2012-11-21 NOTE — Progress Notes (Signed)
Awake. Sensation level at bilateral toes. Bilateral toes tingling. Able to raise hips off bed. Transferred to recliner. Gait steady. Tolerated well. Dressed self.

## 2012-11-21 NOTE — Op Note (Signed)
11/21/2012  9:00 AM  PATIENT:  [name]  72 y.o. male  PRE-OPERATIVE DIAGNOSIS:  torn medial meniscus left knee  POST-OPERATIVE DIAGNOSIS:  torn medial and lateral meniscus left knee; medial plica; osteoarthritis  FINDINGS:  TORN MEDIAL MENISCUS  TORN LATERAL MENISCUS  MEDIAL PLICA MODERATE SYNOVITIS MODERATE OSTEOARTHRITIS   PROCEDURE:  Procedure(s): KNEE ARTHROSCOPY WITH MEDIAL AND LATERAL MENISECTOMY (Left) KNEE ARTHROSCOPY WITH EXCISION PLICA (Left)  DICTATION:  The patient was identified in the preop holding area and the left knee was confirmed as a surgical site. The knee was signed. The consent was reviewed and updated. The chart was updated.  The patient was taken to the operating room for spinal anesthetic. He was given 3 g of IV Ancef per protocol. The left knee was placed in an arthroscopic leg holder the right knee was placed in a heavily padded device.  The left knee was then prepped and draped sterilely.  The timeout was completed.  Standard lateral portal was established a diagnostic arthroscopy was performed from the lateral portal. The knee was evaluated circumferentially. The findings were as follows. The posterior horn of the medial meniscus was torn there was a great 3 lesion of the medial femoral condyle. The anterior cruciate ligament was intact. There was a tear the posterior horn the lateral meniscus. There was grade 2 lesion of the trochlea. There is a moderate amount of synovitis.  And medial portal was established and a probe was placed into the joint to further evaluate the intra-articular structures. The meniscal tear was defined. The arthroscopic shaver was used to resect the torn meniscal tissue and this was further debrided and balanced with an arthroscopic wand.  A shaver was then used to perform a chondroplasty of the medial femoral condyle and resect synovial tissue including the medial plica.  The knee was then lavaged. We injected Marcaine  with epinephrine.  The portals were closed with 3-0 nylon and the knee was dressed sterilely.  A Cryo/Cuff was placed and activated.  SURGEON:  Surgeon(s) and Role:    * Vickki Hearing, MD - Primary  PHYSICIAN ASSISTANT:   ASSISTANTS: none   ANESTHESIA:   spinal  EBL:  Total I/O In: 500 [I.V.:500] Out: 0   BLOOD ADMINISTERED:none  DRAINS: none   LOCAL MEDICATIONS USED:  MARCAINE 0.5%   and Amount: 60 ml  SPECIMEN:  No Specimen  DISPOSITION OF SPECIMEN:  N/A  COUNTS:  YES  TOURNIQUET:    DICTATION: .Dragon Dictation  PLAN OF CARE: Discharge to home after PACU  PATIENT DISPOSITION:  PACU stable    Delay start of Pharmacological VTE agent (>24hrs) due to surgical blood loss or risk of bleeding: not applicable

## 2012-11-21 NOTE — Progress Notes (Signed)
Awake. Denies pain. drsg to left knee dry/intact. Left pedal pulse palpable. Moves left toes without difficulty. Cryo cuff in place. Instructions given. Voiced understanding.

## 2012-11-21 NOTE — Transfer of Care (Signed)
Immediate Anesthesia Transfer of Care Note  Patient: [name]  Procedure(s) Performed: Procedure(s): KNEE ARTHROSCOPY WITH MEDIAL AND LATERAL MENISECTOMY (Left) KNEE ARTHROSCOPY WITH EXCISION PLICA (Left)  Patient Location: PACU  Anesthesia Type:Spinal  Level of Consciousness: awake, alert  and oriented  Airway & Oxygen Therapy: Patient Spontanous Breathing  Post-op Assessment: Report given to PACU RN  Post vital signs: Reviewed  SBP 80, neo 100 mcg given, Dr. Tollie Eth aware.  Complications: No apparent anesthesia complications

## 2012-11-21 NOTE — Addendum Note (Signed)
Addendum created 11/21/12 1001 by Moshe Salisbury, CRNA   Modules edited: Anesthesia Medication Administration

## 2012-11-21 NOTE — Interval H&P Note (Signed)
History and Physical Interval Note:  11/21/2012 7:14 AM  [name]  has presented today for surgery, with the diagnosis of torn medial meniscus left knee  The various methods of treatment have been discussed with the patient and family. After consideration of risks, benefits and other options for treatment, the patient has consented to  Procedure(s): KNEE ARTHROSCOPY WITH MEDIAL MENISECTOMY (Left) as a surgical intervention .  The patient's history has been reviewed, patient examined, no change in status, stable for surgery.  I have reviewed the patient's chart and labs.  Questions were answered to the patient's satisfaction.     Fuller Canada

## 2012-11-24 ENCOUNTER — Encounter (HOSPITAL_COMMUNITY): Payer: Self-pay | Admitting: Orthopedic Surgery

## 2012-11-24 ENCOUNTER — Ambulatory Visit (INDEPENDENT_AMBULATORY_CARE_PROVIDER_SITE_OTHER): Payer: Medicare Other | Admitting: Orthopedic Surgery

## 2012-11-24 VITALS — BP 130/84 | Ht 69.5 in | Wt 250.0 lb

## 2012-11-24 DIAGNOSIS — S83242S Other tear of medial meniscus, current injury, left knee, sequela: Secondary | ICD-10-CM

## 2012-11-24 DIAGNOSIS — S83289A Other tear of lateral meniscus, current injury, unspecified knee, initial encounter: Secondary | ICD-10-CM | POA: Insufficient documentation

## 2012-11-24 DIAGNOSIS — S83249A Other tear of medial meniscus, current injury, unspecified knee, initial encounter: Secondary | ICD-10-CM | POA: Insufficient documentation

## 2012-11-24 DIAGNOSIS — IMO0002 Reserved for concepts with insufficient information to code with codable children: Secondary | ICD-10-CM

## 2012-11-24 DIAGNOSIS — M1710 Unilateral primary osteoarthritis, unspecified knee: Secondary | ICD-10-CM

## 2012-11-24 DIAGNOSIS — S83282S Other tear of lateral meniscus, current injury, left knee, sequela: Secondary | ICD-10-CM

## 2012-11-24 NOTE — Progress Notes (Signed)
Patient ID: [name], male   DOB: 10/01/1940, 72 y.o.   MRN: [id number] Chief Complaint  Patient presents with  . Follow-up    Post op 1 SALK DOS 11/21/12    BP 130/84  Ht 5' 9.5" (1.765 m)  Wt 250 lb (113.399 kg)  BMI 36.4 kg/m2  Postop torn medial and lateral meniscus medial plica osteoarthritis moderate synovitis moderate moderate osteoarthritis partial medial meniscectomy partial lateral meniscectomy excision of plica  He is doing incredibly well has no swelling portals are clean knee flexion is 95-100 he is ambulating without assistive device  Return 3 weeks it's okay to start therapy 1-2 visits then home program

## 2012-11-25 ENCOUNTER — Ambulatory Visit: Payer: Medicare Other | Admitting: Orthopedic Surgery

## 2012-12-03 ENCOUNTER — Ambulatory Visit (HOSPITAL_COMMUNITY)
Admission: RE | Admit: 2012-12-03 | Discharge: 2012-12-03 | Disposition: A | Discharge status: Home / Self Care | Payer: Medicare Other | Source: Ambulatory Visit | Attending: Orthopedic Surgery | Admitting: Orthopedic Surgery

## 2012-12-03 DIAGNOSIS — R262 Difficulty in walking, not elsewhere classified: Secondary | ICD-10-CM | POA: Insufficient documentation

## 2012-12-03 DIAGNOSIS — IMO0001 Reserved for inherently not codable concepts without codable children: Secondary | ICD-10-CM | POA: Insufficient documentation

## 2012-12-03 DIAGNOSIS — I10 Essential (primary) hypertension: Secondary | ICD-10-CM | POA: Insufficient documentation

## 2012-12-03 DIAGNOSIS — E119 Type 2 diabetes mellitus without complications: Secondary | ICD-10-CM | POA: Insufficient documentation

## 2012-12-03 DIAGNOSIS — M25569 Pain in unspecified knee: Secondary | ICD-10-CM | POA: Insufficient documentation

## 2012-12-03 NOTE — Evaluation (Signed)
Physical Therapy Evaluation  Patient Details  Name: [name] MRN: [id number] Date of Birth: 1941/03/12 Charge eval Today's Date: 12/03/2012 Time: 1445-1530 PT Time Calculation (min): 45 min              Visit#: 1 of 12  Re-eval: 01/02/13 Assessment Diagnosis: L arthroscopic surgery  Surgical Date: 11/21/12 Next MD Visit: 12/16/2012 Prior Therapy: none  Authorization: [employer] medicare     Authorization Visit#: 1 of 10   Past Medical History:  Past Medical History  Diagnosis Date  . Hypothyroidism   . HTN (hypertension)   . CRI (chronic renal insufficiency)   . DM (diabetes mellitus)     Type II  . Anemia   . NASH (nonalcoholic steatohepatitis) 2006    liver transplant  . Squamous cell cancer of external ear     pinna  . S/P colonoscopy with polypectomy 2004    Dr Jena Gauss  . S/P endoscopy 03/2004    Hx esophgaeal varices, pyloric channel ulcer prior to transplant  . History of ERCP 03/21/2011    Extrahepatic biliary obstruction secondary to occluded biliary stent and colon no cholelithiasis, status post sphincterotomy, stent removal and replacement and stone extraction.  . Biliary calculi, common bile duct   . Esophageal varices 03/19/11    mrcp  . Pancreatitis chronic 03/19/11    mrcp  . Elevated liver enzymes    Past Surgical History:  Past Surgical History  Procedure Laterality Date  . Liver transplant  2006    [facility] cirrhosis  . Mouth surgery  may 2012  . Ercp  03/21/2011    Dr. Jena Gauss- ERCP with removal of pediatric feeding tube, endoscopic sphinecterotomy with balloon dilation of ampulla followed by stone extraction and stent placement  . Sphincterotomy  03/21/2011    Procedure: SPHINCTEROTOMY;  Surgeon: Corbin Ade, MD;  Location: [facility];  Service: Gastroenterology;;  . Mrcp  03/19/11    biliary ductal dilatation- stones. esophageal varices  . Bile duct stent placement  03/21/11    Dr. Jena Gauss  . Esophagogastroduodenoscopy  03/20/2004    Dr. Jolyn Nap 2  esophageal varices o/w normal esophageal mucosa, mild changes of snake skin in the gastric mucosa consistent with portal gastropathy, multiple antral erosions  and  a single pyloric channel ulcer benign appearance o/w normal gastric mucosa, normal D1 and S2  . Colonoscopy  08/12/2002    Dr. Jena Gauss- somewhat diffusely friable rectum and colon, polyp at 30 cm.  . Colonoscopy N/A 09/08/2012    Procedure: COLONOSCOPY;  Surgeon: Corbin Ade, MD;  Location: [facility];  Service: Endoscopy;  Laterality: N/A;  10:30  . Cholecystectomy  2006  . Knee arthroscopy with medial menisectomy Left 11/21/2012    Procedure: KNEE ARTHROSCOPY WITH MEDIAL AND LATERAL MENISECTOMY;  Surgeon: Vickki Hearing, MD;  Location: [facility];  Service: Orthopedics;  Laterality: Left;  . Knee arthroscopy with excision plica Left 11/21/2012    Procedure: KNEE ARTHROSCOPY WITH EXCISION PLICA;  Surgeon: Vickki Hearing, MD;  Location: [facility];  Service: Orthopedics;  Laterality: Left;    Subjective Symptoms/Limitations Symptoms: Mr. Fritsch states that he was working out in the garden and his knee started bothering him.  He states that by December he could no longer play golf.   He continued to have pain so he opted to have arthroscopic surgery on 11/21/2012.  He states that his knee feels about the same as prior to surgery therefore he is being referred to therapy. How long  can you sit comfortably?: no problem; pt does have difficulty coming from sit to stand .  How long can you stand comfortably?: He has increased pain after standing more than 30 minutes. How long can you walk comfortably?: The patient is not using an assistive device and is able to walk for over an hour.   Patient Stated Goals: Pt would like to be able to play golf again, be able to come sit to stand without difficulty. Pain Assessment Currently in Pain?: Yes Pain Score:   1 (worst pain has been a 5/10) Pain Location: Knee Pain Orientation: Left Pain Type:  Chronic pain Pain Onset: 1 to 4 weeks ago Pain Frequency: Constant (but varies in intensitiy ) Pain Relieving Factors: icing, pain meds   Balance Screening Balance Screen Has the patient fallen in the past 6 months: No     Sensation/Coordination/Flexibility/Functional Tests Flexibility 90/90: Positive Functional Tests Functional Tests: LEFS 54/80  Assessment LLE AROM (degrees) Left Knee Extension: 5 Left Knee Flexion: 120 LLE Strength Left Hip Flexion: 3/5 Left Hip Extension: 4/5 Left Hip ABduction: 4/5 Left Hip ADduction: 4/5 Left Knee Extension:  (4+/5) Left Ankle Dorsiflexion: 3+/5    Stretches Active Hamstring Stretch: 2 reps;30 seconds   Seated Long Arc Quad: 10 reps Supine Quad Sets: 10 reps Straight Leg Raises: 10 reps Sidelying Hip ABduction: 10 reps Prone  Hamstring Curl: 10 reps Hip Extension: 10 reps    Physical Therapy Assessment and Plan PT Assessment and Plan Clinical Impression Statement: Pt with decreased strength, decreased activity tolerance and increased pain affecting his normal activities.  Pt will benefit from skilled PT to improve pt strength and confidence in his R knee to be able to participate in desired activites to improve pt quality of life. Pt will benefit from skilled therapeutic intervention in order to improve on the following deficits: Pain;Impaired flexibility;Decreased strength Rehab Potential: Good PT Frequency: Min 3X/week PT Duration: 4 weeks PT Treatment/Interventions: Therapeutic activities;Therapeutic exercise;Manual techniques;Modalities PT Plan: begin Nu-step, terminal standing and supine extension, heel raise and minisquats progess as needed to achieve pt goal of playing golf.    Goals Home Exercise Program Pt will Perform Home Exercise Program: Independently PT Short Term Goals Time to Complete Short Term Goals: 2 weeks PT Short Term Goal 1: Strength to be improved 1/2 grade PT Short Term Goal 2: able to come  sit to stand with ease even from a soft chair PT Long Term Goals Time to Complete Long Term Goals: 4 weeks PT Long Term Goal 1: Strength to be improved one grade PT Long Term Goal 2: Pt to be playing 9 holes of golf Long Term Goal 3: Pt to be gardening  Problem List Patient Active Problem List   Diagnosis Date Noted  . Medial meniscus tear 11/24/2012  . Lateral meniscal tear 11/24/2012  . OA (osteoarthritis) of knee 02/28/2012  . Obstructive hyperbilirubinemia 03/27/2011  . Papular urticaria 03/18/2011  . LFTs abnormal 03/17/2011  . Liver replaced by transplant 01/05/2009  . GOUT, UNSPECIFIED 01/04/2009  . RADIATION THERAPY, HX OF 01/04/2009  . HYPOTHYROIDISM 12/31/2008  . DIABETES MELLITUS, TYPE II 12/31/2008  . ANEMIA, NORMOCYTIC 12/31/2008  . HYPERTENSION 12/31/2008  . LIVER FAILURE, ACUTE 12/31/2008  . Other chronic nonalcoholic liver disease 12/31/2008  . RENAL INSUFFICIENCY, CHRONIC 12/31/2008    General Behavior During Therapy: [facility] for tasks assessed/performed Cognition: [facility] for tasks performed PT Plan of Care PT Home Exercise Plan: given  GP Functional Assessment Tool Used: LEFS Functional Limitation:  Mobility: Walking and moving around Mobility: Walking and Moving Around Current Status [phone]): At least 20 percent but less than 40 percent impaired, limited or restricted Mobility: Walking and Moving Around Goal Status [phone]): At least 1 percent but less than 20 percent impaired, limited or restricted  Marqual Mi,CINDY 12/03/2012, 5:20 PM  Physician Documentation Your signature is required to indicate approval of the treatment plan as stated above.  Please sign and either send electronically or make a copy of this report for your files and return this physician signed original.   Please mark one 1.__approve of plan  2. ___approve of plan with the following conditions.   ______________________________                                                           _____________________ Physician Signature                                                                                                             Date

## 2012-12-08 ENCOUNTER — Ambulatory Visit (HOSPITAL_COMMUNITY)
Admission: RE | Admit: 2012-12-08 | Discharge: 2012-12-08 | Disposition: A | Discharge status: Home / Self Care | Payer: Medicare Other | Source: Ambulatory Visit | Attending: Orthopedic Surgery | Admitting: Orthopedic Surgery

## 2012-12-08 DIAGNOSIS — R262 Difficulty in walking, not elsewhere classified: Secondary | ICD-10-CM | POA: Insufficient documentation

## 2012-12-08 DIAGNOSIS — I10 Essential (primary) hypertension: Secondary | ICD-10-CM | POA: Insufficient documentation

## 2012-12-08 DIAGNOSIS — E119 Type 2 diabetes mellitus without complications: Secondary | ICD-10-CM | POA: Insufficient documentation

## 2012-12-08 DIAGNOSIS — M25569 Pain in unspecified knee: Secondary | ICD-10-CM | POA: Insufficient documentation

## 2012-12-08 DIAGNOSIS — IMO0001 Reserved for inherently not codable concepts without codable children: Secondary | ICD-10-CM | POA: Insufficient documentation

## 2012-12-08 NOTE — Progress Notes (Signed)
Physical Therapy Treatment Patient Details  Name: [name] MRN: [id number] Date of Birth: 12/01/1940  Today's Date: 12/08/2012 Time: 1428-1510 PT Time Calculation (min): 42 min Charge: Therex 42  Visit#: 2 of 12  Re-eval: 01/02/13    Authorization: [employer] medicare  Authorization Time Period:    Authorization Visit#: 2 of 10   Subjective: Symptoms/Limitations Symptoms: Pt reported compliance with HEP twice daily.  Stated knee musculature soreness, no real pain today. Pain Assessment Currently in Pain?: Yes Pain Score:   1 (soreneess) Pain Location: Knee Pain Orientation: Left  Objective:   Exercise/Treatments Stretches Active Hamstring Stretch: 3 reps;30 seconds;Limitations Active Hamstring Stretch Limitations: with rope Quad Stretch: 3 reps;30 seconds Aerobic Stationary Bike: [place] level 3 resistance 3 SPM average 93 Standing Heel Raises: Limitations Heel Raises Limitations: heel walking 2RT Terminal Knee Extension: Left;10 reps;Theraband Theraband Level (Terminal Knee Extension): Level 4 (Blue) Functional Squat: 10 reps;Limitations Functional Squat Limitations: minisquats with cueing for form Supine Quad Sets: 10 reps Short Arc Quad Sets: Left;10 reps Terminal Knee Extension: Left;10 reps Straight Leg Raises: 10 reps Sidelying Hip ABduction: 10 reps Prone  Hamstring Curl: 10 reps Hip Extension: 10 reps Other Prone Exercises: TKE 10x 10"      Physical Therapy Assessment and Plan PT Assessment and Plan Clinical Impression Statement: Began treatment per PT POC for knee strengthening and to improve AROM focus on extension today.  Pt with min cueing for technique and form with exercises.  Pt educated on benefits of ice to assist with edema and pain control, pt plans to ice with cryocuff when home.   PT Plan: Continue with current POC, progress as able.  Begin prone knee hang next session to improve knee extension and stair training when ready.  Continue  to progress therex to acheive pt's personal goal of returning to playing golf.    Goals    Problem List Patient Active Problem List   Diagnosis Date Noted  . Medial meniscus tear 11/24/2012  . Lateral meniscal tear 11/24/2012  . OA (osteoarthritis) of knee 02/28/2012  . Obstructive hyperbilirubinemia 03/27/2011  . Papular urticaria 03/18/2011  . LFTs abnormal 03/17/2011  . Liver replaced by transplant 01/05/2009  . GOUT, UNSPECIFIED 01/04/2009  . RADIATION THERAPY, HX OF 01/04/2009  . HYPOTHYROIDISM 12/31/2008  . DIABETES MELLITUS, TYPE II 12/31/2008  . ANEMIA, NORMOCYTIC 12/31/2008  . HYPERTENSION 12/31/2008  . LIVER FAILURE, ACUTE 12/31/2008  . Other chronic nonalcoholic liver disease 12/31/2008  . RENAL INSUFFICIENCY, CHRONIC 12/31/2008    PT - End of Session Activity Tolerance: Patient tolerated treatment well General Behavior During Therapy: [facility] for tasks assessed/performed Cognition: WFL for tasks performed  GP    Juel Burrow 12/08/2012, 3:14 PM

## 2012-12-10 ENCOUNTER — Ambulatory Visit (HOSPITAL_COMMUNITY)
Admission: RE | Admit: 2012-12-10 | Discharge: 2012-12-10 | Disposition: A | Discharge status: Home / Self Care | Payer: Medicare Other | Source: Ambulatory Visit | Attending: Orthopedic Surgery | Admitting: Orthopedic Surgery

## 2012-12-10 NOTE — Progress Notes (Signed)
Physical Therapy Treatment Patient Details  Name: [name] MRN: [id number] Date of Birth: Aug 30, 1940  Today's Date: 12/10/2012 Time: 4098-1191 PT Time Calculation (min): 45 min Charge:  Therex 45' [phone])  Visit#: 3 of 12  Re-eval: 01/02/13    Authorization: [employer] medicare  Authorization Time Period:    Authorization Visit#: 3 of 10   Subjective: Symptoms/Limitations Symptoms: Pt continues to compliant with HEP.  Stated knee is stiff no real pain today.   Pain Assessment Currently in Pain?: Yes  Objective: Exercise/Treatments Stretches Gastroc Stretch: 3 reps;30 seconds;Limitations Gastroc Stretch Limitations: slant board Aerobic Stationary Bike: Nustep x 10' Hill level 3 resistance 3 SPM average 93 Standing Heel Raises: Limitations Heel Raises Limitations: heel walking 2RT Terminal Knee Extension: Left;15 reps;Theraband Theraband Level (Terminal Knee Extension): Level 4 (Blue) Lateral Step Up: Left;10 reps;Hand Hold: 2;Step Height: 4" Forward Step Up: Left;10 reps;Hand Hold: 0;Step Height: 4" Functional Squat: 15 reps Other Standing Knee Exercises: tandem stance 2x 30" Supine Quad Sets: 10 reps Short Arc Quad Sets: Left;15 reps Terminal Knee Extension: Left;10 reps Straight Leg Raises: 10 reps Sidelying Hip ABduction: 15 reps Prone  Hamstring Curl: 15 reps Hip Extension: 15 reps Prone Knee Hang: 2 minutes;Limitations Prone Knee Hang Limitations: with hamstring massage to decrease tightness Other Prone Exercises: TKE 10x 10"      Physical Therapy Assessment and Plan PT Assessment and Plan Clinical Impression Statement: Added prone knee hang to improve knee extension with manual soft tissue massage to hamstrings to reduce tightness and encourage full knee extesion, pt tolerated well with no complains.  Pt able to performed full therex with no rest breaks and min cueing for form with prone hip extension, functional squats and technique with new stair  training added today as well.  Noted visible musculature fatgue with new activities, no reports of increased pain.  Pt plans to apply ice at home for pain and edema control.   PT Plan: Continue with current POC, progress as able.  Begin step down next session.  Continue to progress therex to acheive pt's personal goal of returning to playing golf.    Goals    Problem List Patient Active Problem List   Diagnosis Date Noted  . Medial meniscus tear 11/24/2012  . Lateral meniscal tear 11/24/2012  . OA (osteoarthritis) of knee 02/28/2012  . Obstructive hyperbilirubinemia 03/27/2011  . Papular urticaria 03/18/2011  . LFTs abnormal 03/17/2011  . Liver replaced by transplant 01/05/2009  . GOUT, UNSPECIFIED 01/04/2009  . RADIATION THERAPY, HX OF 01/04/2009  . HYPOTHYROIDISM 12/31/2008  . DIABETES MELLITUS, TYPE II 12/31/2008  . ANEMIA, NORMOCYTIC 12/31/2008  . HYPERTENSION 12/31/2008  . LIVER FAILURE, ACUTE 12/31/2008  . Other chronic nonalcoholic liver disease 12/31/2008  . RENAL INSUFFICIENCY, CHRONIC 12/31/2008    PT - End of Session Activity Tolerance: Patient tolerated treatment well General Behavior During Therapy: [facility] for tasks assessed/performed Cognition: WFL for tasks performed  GP    Juel Burrow 12/10/2012, 5:20 PM

## 2012-12-12 ENCOUNTER — Ambulatory Visit (HOSPITAL_COMMUNITY)
Admission: RE | Admit: 2012-12-12 | Discharge: 2012-12-12 | Disposition: A | Discharge status: Home / Self Care | Payer: Medicare Other | Source: Ambulatory Visit | Attending: Orthopedic Surgery | Admitting: Orthopedic Surgery

## 2012-12-12 NOTE — Progress Notes (Signed)
Physical Therapy Treatment Patient Details  Name: [name] MRN: [id number] Date of Birth: 1941-05-24  Today's Date: 12/12/2012 Time: 1430-1510 PT Time Calculation (min): 40 min  Visit#: 4 of 12  Re-eval: 01/02/13 Charges: Therex x 38'  Authorization: [employer] medicare  Authorization Visit#: 4 of 10   Subjective: Symptoms/Limitations Symptoms: Pt reports no pain only stiffness. Pain Assessment Currently in Pain?: No/denies   Exercise/Treatments Aerobic Stationary Bike: Nustep x 10' Hill level 3 resistance 3 SPM average 93 Standing Heel Raises Limitations: heel walking 2RT Lateral Step Up: Left;Hand Hold: 2;Step Height: 4";15 reps Forward Step Up: Left;Hand Hold: 0;Step Height: 4";15 reps Functional Squat: 15 reps Other Standing Knee Exercises: Tandem gait 1 RT Seated Other Seated Knee Exercises: Sit to stand from standar surface without UE assist x 5 Supine Quad Sets: 10 reps Straight Leg Raises: 10 reps  Physical Therapy Assessment and Plan PT Assessment and Plan Clinical Impression Statement: Pt requires multimodal cueing to improve form with steps ups and functional squats. Pt is very sensitive to touch at medial incision. Educated pt on desensitization techniques. Pt requires multimodal cueing to facilitate distal quad contraction. PT Plan: Continue with current POC, progress as able.  Begin step down next session.  Continue to progress therex to acheive pt's personal goal of returning to playing golf.     Problem List Patient Active Problem List   Diagnosis Date Noted  . Medial meniscus tear 11/24/2012  . Lateral meniscal tear 11/24/2012  . OA (osteoarthritis) of knee 02/28/2012  . Obstructive hyperbilirubinemia 03/27/2011  . Papular urticaria 03/18/2011  . LFTs abnormal 03/17/2011  . Liver replaced by transplant 01/05/2009  . GOUT, UNSPECIFIED 01/04/2009  . RADIATION THERAPY, HX OF 01/04/2009  . HYPOTHYROIDISM 12/31/2008  . DIABETES MELLITUS, TYPE II  12/31/2008  . ANEMIA, NORMOCYTIC 12/31/2008  . HYPERTENSION 12/31/2008  . LIVER FAILURE, ACUTE 12/31/2008  . Other chronic nonalcoholic liver disease 12/31/2008  . RENAL INSUFFICIENCY, CHRONIC 12/31/2008    PT - End of Session Activity Tolerance: Patient tolerated treatment well General Behavior During Therapy: [facility] for tasks assessed/performed Cognition: WFL for tasks performed  Seth Bake, PTA 12/12/2012, 3:33 PM

## 2012-12-15 ENCOUNTER — Ambulatory Visit (HOSPITAL_COMMUNITY)
Admission: RE | Admit: 2012-12-15 | Discharge: 2012-12-15 | Disposition: A | Discharge status: Home / Self Care | Payer: Medicare Other | Source: Ambulatory Visit | Attending: Orthopedic Surgery | Admitting: Orthopedic Surgery

## 2012-12-15 NOTE — Progress Notes (Signed)
Physical Therapy Treatment Patient Details  Name: [name] MRN: [id number] Date of Birth: [birth year]  Today's Date: 12/15/2012 Time: 4098-1191 PT Time Calculation (min): 41 min Charge there ex [id number] Visit#: 5 of 12  Re-eval: 01/02/13    Authorization: [employer] medicare   Authorization Visit#:  5 of   10  Subjective: Symptoms/Limitations Symptoms: Pt states he vacuumed this morning and is going up and down his steps easier. Pain Assessment Pain Score:   2  More soreness Pain Location: Knee Pain Orientation: Left     Exercise/Treatments    Stretches Active Hamstring Stretch: 3 reps;30 seconds Passive Hamstring Stretch: 60 seconds;Limitations Passive Hamstring Stretch Limitations: long sitting Aerobic Stationary Bike: Nustep x 8' Hill level 3 resistance 3 SPM average 93   Standing Heel Raises: 10 reps Heel Raises Limitations: pt not strong enough for heel walking Lateral Step Up: Left;15 reps;Hand Hold: 1;Step Height: 6" Forward Step Up: Left;15 reps;Hand Hold: 0;Step Height: 6" Step Down: Left;10 reps Functional Squat: 15 reps Rocker Board: 2 minutes SLS with Vectors: 3 sec x 3  Other Standing Knee Exercises: Tandem gait 1 RT Other Standing Knee Exercises: forward and lateral lunging x 10 Seated Other Seated Knee Exercises: Sit to stand from standar surface without UE assist x 5 Supine Quad Sets: 10 reps Straight Leg Raises: 10 reps Knee Extension: PROM    Physical Therapy Assessment and Plan PT Assessment and Plan Clinical Impression Statement: Pt increased in step height added vector stance for improved balance; added long sit hamstring stretch due to extremely tight hamstrings.  Flexion wnl worked on [employer] extension. Pt will benefit from skilled therapeutic intervention in order to improve on the following deficits: Pain;Impaired flexibility;Decreased strength PT Plan: Continue with current POC, progress as able.   Continue to progress therex to acheive  pt's personal goal of returning to playing golf.    Goals Home Exercise Program Pt will Perform Home Exercise Program: Independently PT Goal: Perform Home Exercise Program - Progress: Met PT Short Term Goals PT Short Term Goal 1: Strength to be improved 1/2 grade PT Short Term Goal 1 - Progress: Met PT Short Term Goal 2: able to come sit to stand with ease even from a soft chair PT Short Term Goal 2 - Progress: Progressing toward goal PT Long Term Goals PT Long Term Goal 1: Strength to be improved one grade PT Long Term Goal 1 - Progress: Progressing toward goal PT Long Term Goal 2: Pt to be playing 9 holes of golf PT Long Term Goal 2 - Progress: Not met Long Term Goal 3: Pt to be gardening Long Term Goal 3 Progress: Met  Problem List Patient Active Problem List   Diagnosis Date Noted  . Medial meniscus tear 11/24/2012  . Lateral meniscal tear 11/24/2012  . OA (osteoarthritis) of knee 02/28/2012  . Obstructive hyperbilirubinemia 03/27/2011  . Papular urticaria 03/18/2011  . LFTs abnormal 03/17/2011  . Liver replaced by transplant 01/05/2009  . GOUT, UNSPECIFIED 01/04/2009  . RADIATION THERAPY, HX OF 01/04/2009  . HYPOTHYROIDISM 12/31/2008  . DIABETES MELLITUS, TYPE II 12/31/2008  . ANEMIA, NORMOCYTIC 12/31/2008  . HYPERTENSION 12/31/2008  . LIVER FAILURE, ACUTE 12/31/2008  . Other chronic nonalcoholic liver disease 12/31/2008  . RENAL INSUFFICIENCY, CHRONIC 12/31/2008    PT - End of Session Activity Tolerance: Patient tolerated treatment well General Behavior During Therapy: [facility] for tasks assessed/performed Cognition: WFL for tasks performed  GP    RUSSELL,CINDY 12/15/2012, 3:10 PM

## 2012-12-16 ENCOUNTER — Ambulatory Visit (INDEPENDENT_AMBULATORY_CARE_PROVIDER_SITE_OTHER): Payer: Medicare Other | Admitting: Orthopedic Surgery

## 2012-12-16 VITALS — BP 124/88 | Ht 69.5 in | Wt 250.0 lb

## 2012-12-16 DIAGNOSIS — Z9889 Other specified postprocedural states: Secondary | ICD-10-CM

## 2012-12-16 DIAGNOSIS — M1710 Unilateral primary osteoarthritis, unspecified knee: Secondary | ICD-10-CM

## 2012-12-16 NOTE — Progress Notes (Signed)
Patient ID: [name], male   DOB: [birth year], 72 y.o.   MRN: [id number] Chief Complaint  Patient presents with  . Follow-up    Post op 2 SARK DOS 11/21/12    Status post right knee arthroscopy and meniscectomy had arthritis doing well he has a few more visits in therapy left  His portal sites are tender but clean dry and intact no erythema. He has a flexion contracture chronic prior to surgery  He has good flexion and his knee no swelling  Exam bleeding with no assistive devices  He will followup with [place] as needed

## 2012-12-16 NOTE — Patient Instructions (Addendum)
Finish therapy    

## 2012-12-17 ENCOUNTER — Ambulatory Visit (HOSPITAL_COMMUNITY)
Admission: RE | Admit: 2012-12-17 | Discharge: 2012-12-17 | Disposition: A | Discharge status: Home / Self Care | Payer: Medicare Other | Source: Ambulatory Visit | Attending: Orthopedic Surgery | Admitting: Orthopedic Surgery

## 2012-12-17 NOTE — Progress Notes (Signed)
Physical Therapy Treatment Patient Details  Name: [name] MRN: [id number] Date of Birth: [birth year]  Today's Date: 12/17/2012 Time: 1430-1510 PT Time Calculation (min): 40 min  Visit#: 6 of 12  Re-eval: 01/02/13 Charges: Therex x 38'  Authorization: [employer] medicare    Subjective: Symptoms/Limitations Symptoms: Pt states that he mowed his yaard yesterday. Pain Assessment Currently in Pain?: No/denies   Exercise/Treatments Aerobic Stationary Bike: Nustep x 8' Hill level 3 resistance 3 SPM average 93 Standing Heel Raises: 10 reps Heel Raises Limitations: toe raises  x 10 Forward Lunges: 10 reps Side Lunges: 10 reps Lateral Step Up: Left;15 reps;Hand Hold: 1;Step Height: 6" Forward Step Up: Left;15 reps;Hand Hold: 0;Step Height: 6" Step Down: Left;10 reps;Step Height: 6" Functional Squat: 15 reps Rocker Board: 2 minutes SLS with Vectors: Max of 3 7" Other Standing Knee Exercises: Tandem gait 1 RT Other Standing Knee Exercises: Practicing golf swing with 1# wand  Physical Therapy Assessment and Plan PT Assessment and Plan Clinical Impression Statement: Pt competes therex well after initial cueing and demo. PT requires frequent cueing to avoid looking down throughout session. Pt displays improved stability with SLS and tandem gait after vc's to find focal point. Pt denies ice and states that he will ice his knee at home. Pt will benefit from skilled therapeutic intervention in order to improve on the following deficits: Pain;Impaired flexibility;Decreased strength PT Plan: Continue with current POC, progress as able.   Continue to progress therex to acheive pt's personal goal of returning to playing golf.     Problem List Patient Active Problem List   Diagnosis Date Noted  . Medial meniscus tear 11/24/2012  . Lateral meniscal tear 11/24/2012  . OA (osteoarthritis) of knee 02/28/2012  . Obstructive hyperbilirubinemia 03/27/2011  . Papular urticaria 03/18/2011  . LFTs  abnormal 03/17/2011  . Liver replaced by transplant 01/05/2009  . GOUT, UNSPECIFIED 01/04/2009  . RADIATION THERAPY, HX OF 01/04/2009  . HYPOTHYROIDISM 12/31/2008  . DIABETES MELLITUS, TYPE II 12/31/2008  . ANEMIA, NORMOCYTIC 12/31/2008  . HYPERTENSION 12/31/2008  . LIVER FAILURE, ACUTE 12/31/2008  . Other chronic nonalcoholic liver disease 12/31/2008  . RENAL INSUFFICIENCY, CHRONIC 12/31/2008    PT - End of Session Activity Tolerance: Patient tolerated treatment well General Behavior During Therapy: [facility] for tasks assessed/performed Cognition: WFL for tasks performed  Seth Bake, PTA 12/17/2012, 4:41 PM

## 2012-12-18 ENCOUNTER — Ambulatory Visit (HOSPITAL_COMMUNITY)
Admission: RE | Admit: 2012-12-18 | Discharge: 2012-12-18 | Disposition: A | Discharge status: Home / Self Care | Payer: Medicare Other | Source: Ambulatory Visit | Attending: Orthopedic Surgery | Admitting: Orthopedic Surgery

## 2012-12-18 NOTE — Progress Notes (Signed)
Physical Therapy Treatment Patient Details  Name: [name] MRN: [id number] Date of Birth: 10-01-1940  Today's Date: 12/18/2012 Time: 1308-6578 PT Time Calculation (min): 40 min  Visit#: 7 of 12  Re-eval: 01/02/13 Charges: Therex x 38'  Authorization: [employer] medicare  Authorization Visit#: 7 of 10   Subjective: Symptoms/Limitations Symptoms: Pt states that he only has pain with certain movements. Pain Assessment Currently in Pain?: No/denies   Exercise/Treatments Aerobic Stationary Bike: Nustep x 10' Hill level 3 resistance 3 SPM average 93 to improve strength and activity tolerance Standing Forward Lunges: 10 reps Side Lunges: 10 reps Lateral Step Up: Left;15 reps;Hand Hold: 1;Step Height: 6" Forward Step Up: Left;15 reps;Hand Hold: 0;Step Height: 6" Step Down: Left;10 reps;Step Height: 6" Functional Squat: 15 reps Rocker Board: 2 minutes SLS with Vectors: Max of 3 21" Other Standing Knee Exercises: Tandem gait 2 RT Supine Quad Sets: 10 reps;Limitations Quad Sets Limitations: with multimodal cueing to facilitate distal quad contraction  Short Arc Quad Sets: 10 reps;Limitations Short Arc Quad Sets Limitations: 10" holds Straight Leg Raises: 10 reps Knee Extension: PROM  Physical Therapy Assessment and Plan PT Assessment and Plan Clinical Impression Statement: Pt displays improved control and power with step ups. Decreased swelling in left knee noted. Pt requires multimodal cueing to facilitate distal quad contraction with quad sets. Pt displays improved proprioceptive control with SLS. Pt plans to ice knee at home. Pt will benefit from skilled therapeutic intervention in order to improve on the following deficits: Pain;Impaired flexibility;Decreased strength PT Plan: Continue with current POC, progress as able.   Continue to progress therex to acheive pt's personal goal of returning to playing golf.     Problem List Patient Active Problem List   Diagnosis Date  Noted  . Medial meniscus tear 11/24/2012  . Lateral meniscal tear 11/24/2012  . OA (osteoarthritis) of knee 02/28/2012  . Obstructive hyperbilirubinemia 03/27/2011  . Papular urticaria 03/18/2011  . LFTs abnormal 03/17/2011  . Liver replaced by transplant 01/05/2009  . GOUT, UNSPECIFIED 01/04/2009  . RADIATION THERAPY, HX OF 01/04/2009  . HYPOTHYROIDISM 12/31/2008  . DIABETES MELLITUS, TYPE II 12/31/2008  . ANEMIA, NORMOCYTIC 12/31/2008  . HYPERTENSION 12/31/2008  . LIVER FAILURE, ACUTE 12/31/2008  . Other chronic nonalcoholic liver disease 12/31/2008  . RENAL INSUFFICIENCY, CHRONIC 12/31/2008    PT - End of Session Activity Tolerance: Patient tolerated treatment well General Behavior During Therapy: [facility] for tasks assessed/performed Cognition: WFL for tasks performed  Seth Bake, PTA 12/18/2012, 2:46 PM

## 2012-12-22 ENCOUNTER — Ambulatory Visit (HOSPITAL_COMMUNITY)
Admission: RE | Admit: 2012-12-22 | Discharge: 2012-12-22 | Disposition: A | Discharge status: Home / Self Care | Payer: Medicare Other | Source: Ambulatory Visit | Attending: Orthopedic Surgery | Admitting: Orthopedic Surgery

## 2012-12-22 NOTE — Progress Notes (Signed)
Physical Therapy Treatment Patient Details  Name: [name] MRN: [id number] Date of Birth: [birth year]  Today's Date: 12/22/2012 Time: 1430-1510 PT Time Calculation (min): 40 min  Visit#: 8 of 12  Re-eval: 01/02/13 Authorization: [employer] medicare  Authorization Visit#: 8 of 10  Charges:  therex 38'  Subjective:  Symptoms/Limitations Symptoms: Pt reports he may try to go play golf this Thursday.  Currently without pain. Pain Assessment Currently in Pain?: No/denies   Exercise/Treatments Aerobic Stationary Bike: Nustep x 10' Hill level 3 resistance 3 SPM average 93 to improve strength and activity tolerance Standing Forward Lunges: 10 reps Side Lunges: 10 reps Lateral Step Up: Left;15 reps;Hand Hold: 1;Step Height: 6" Forward Step Up: Left;15 reps;Hand Hold: 0;Step Height: 6" Step Down: Left;10 reps;Step Height: 6" Functional Squat: 15 reps Rocker Board: 2 minutes SLS: 21" max of 3 Other Standing Knee Exercises: Tandem gait 2 RT Supine Quad Sets: 10 reps;Limitations Quad Sets Limitations: cues to facilitate distal quad Short Arc Quad Sets: 10 reps;Limitations Short Arc Quad Sets Limitations: 10"holds Straight Leg Raises: 10 reps;2 sets Knee Extension: PROM      Physical Therapy Assessment and Plan PT Assessment and Plan Clinical Impression Statement: Pt. able to complete all exercises with minimal rest breaks.  Continued weak quad and unable to keep knee extended with SLR however able to extend knee better with improved distal quad recruitment.   Pt will benefit from skilled therapeutic intervention in order to improve on the following deficits: Pain;Impaired flexibility;Decreased strength PT Plan: Continue with current POC, progress as able.   Continue to progress therex to acheive pt's personal goal of returning to playing golf.     Problem List Patient Active Problem List   Diagnosis Date Noted  . Medial meniscus tear 11/24/2012  . Lateral meniscal tear  11/24/2012  . OA (osteoarthritis) of knee 02/28/2012  . Obstructive hyperbilirubinemia 03/27/2011  . Papular urticaria 03/18/2011  . LFTs abnormal 03/17/2011  . Liver replaced by transplant 01/05/2009  . GOUT, UNSPECIFIED 01/04/2009  . RADIATION THERAPY, HX OF 01/04/2009  . HYPOTHYROIDISM 12/31/2008  . DIABETES MELLITUS, TYPE II 12/31/2008  . ANEMIA, NORMOCYTIC 12/31/2008  . HYPERTENSION 12/31/2008  . LIVER FAILURE, ACUTE 12/31/2008  . Other chronic nonalcoholic liver disease 12/31/2008  . RENAL INSUFFICIENCY, CHRONIC 12/31/2008    PT - End of Session Activity Tolerance: Patient tolerated treatment well General Behavior During Therapy: [facility] for tasks assessed/performed Cognition: WFL for tasks performed     Lurena Nida, PTA/CLT 12/22/2012, 3:18 PM

## 2012-12-24 ENCOUNTER — Ambulatory Visit (HOSPITAL_COMMUNITY)
Admission: RE | Admit: 2012-12-24 | Discharge: 2012-12-24 | Disposition: A | Discharge status: Home / Self Care | Payer: Medicare Other | Source: Ambulatory Visit | Attending: Orthopedic Surgery | Admitting: Orthopedic Surgery

## 2012-12-24 NOTE — Evaluation (Addendum)
Physical Therapy Re-evaluation / discharge  Patient Details  Name: [name] MRN: [id number] Date of Birth: 06-Aug-1940  Today's Date: 12/24/2012 Time: 1432-1501 PT Time Calculation (min): 29 min              Visit#: 9 of 12  Re-eval: 01/02/13 Diagnosis: L arthroscopic surgery  Surgical Date: 11/21/12 Next MD Visit: not scheduled; released Authorization: [facility] medicare    Authorization Visit#: 9 of 10  Charges:  MMT X 1 unit, physical performance testing 15'  Subjective Symptoms/Limitations Symptoms: Pt. reports he's been shopping at [employer] today with his wife, walking approximately 2 hours.  No pain or difficulty today.  Pt. states he anticipates playing golf this Thursday.  States he feels he can be done with therapy.   Objective: Sensation/Coordination/Flexibility/Functional Tests Functional Tests: LEFS  80/80 (was 54/80)  LLE AROM (degrees) Left Knee Extension: 0 (was lacking 5 degrees from neutral) Left Knee Flexion: 125 (was 120 degrees)  LLE Strength Left Hip Flexion: 5/5 (was 3/5) Left Hip Extension: 5/5 (was 4/5) Left Hip ABduction: 5/5 (was 4/5) Left Hip ADduction: 5/5 (was 4/5) Left Knee Flexion: 5/5 Left Knee Extension: 5/5 Left Ankle Dorsiflexion: 4/5 (was 3+/5)   Physical Therapy Assessment and Plan PT Assessment and Plan Clinical Impression Statement: [name] has completed 9 visits of physical therapy for Lt. LE knee arthroscopy.  Pt has met all goals with exception of playing 9 holes of golf, which he intends to play tomorrow anticipatiing no problems.  Pt .has been practicing his golf swing without difficulty.  LLE strength and ROM is now WNL and his LEFS score is 80/80.  Pt is painfree. Pt will benefit from skilled therapeutic intervention in order to improve on the following deficits: Pain;Impaired flexibility;Decreased strength PT Plan: Discharge to HEP per completion of goals/pt request.     Goals Home Exercise Program Pt will Perform Home  Exercise Program: Independently - Progress: Met  PT Short Term Goals PT Short Term Goal 1: Strength to be improved 1/2 grade- Progress: Met PT Short Term Goal 2: able to come sit to stand with ease even from a soft chair- Progress: Met  PT Long Term Goals PT Long Term Goal 1: Strength to be improved one grade- Progress: Met PT Long Term Goal 2: Pt to be playing 9 holes of golf- Progress: Partly met (going to play tomorrow but anticipates no difficulty) Long Term Goal 3: Pt to be gardening- Progress: Met  Problem List Patient Active Problem List   Diagnosis Date Noted  . Medial meniscus tear 11/24/2012  . Lateral meniscal tear 11/24/2012  . OA (osteoarthritis) of knee 02/28/2012  . Obstructive hyperbilirubinemia 03/27/2011  . Papular urticaria 03/18/2011  . LFTs abnormal 03/17/2011  . Liver replaced by transplant 01/05/2009  . GOUT, UNSPECIFIED 01/04/2009  . RADIATION THERAPY, HX OF 01/04/2009  . HYPOTHYROIDISM 12/31/2008  . DIABETES MELLITUS, TYPE II 12/31/2008  . ANEMIA, NORMOCYTIC 12/31/2008  . HYPERTENSION 12/31/2008  . LIVER FAILURE, ACUTE 12/31/2008  . Other chronic nonalcoholic liver disease 12/31/2008  . RENAL INSUFFICIENCY, CHRONIC 12/31/2008    PT - End of Session Activity Tolerance: Patient tolerated treatment well General Behavior During Therapy: WFL for tasks assessed/performed Cognition: WFL for tasks performed PT Plan of Care Consulted and Agree with Plan of Care: Patient  GP Functional Assessment Tool Used: LEFS Functional Limitation: Mobility: Walking and moving around Mobility: Walking and Moving Around Current Status ([id number]): 0 percent impaired, limited or restricted Mobility: Walking and Moving Around Goal Status (  [id number]): At least 1 percent but less than 20 percent impaired, limited or restricted Mobility: Walking and Moving Around Discharge Status [phone]): 0 percent impaired, limited or restricted   Lurena Nida, PTA/CLT 12/24/2012, 3:05 PM

## 2012-12-26 ENCOUNTER — Ambulatory Visit (HOSPITAL_COMMUNITY): Payer: Medicare Other | Admitting: Physical Therapy

## 2012-12-29 ENCOUNTER — Ambulatory Visit (HOSPITAL_COMMUNITY): Payer: Medicare Other | Admitting: *Deleted

## 2012-12-31 ENCOUNTER — Ambulatory Visit (HOSPITAL_COMMUNITY): Payer: Medicare Other

## 2013-01-02 ENCOUNTER — Ambulatory Visit (HOSPITAL_COMMUNITY): Payer: Medicare Other

## 2014-01-25 ENCOUNTER — Encounter: Payer: Self-pay | Admitting: Family Medicine

## 2015-02-08 LAB — HEMOGLOBIN A1C: HEMOGLOBIN A1C: 7.2 % — AB (ref 4.0–6.0)

## 2015-03-16 ENCOUNTER — Encounter: Payer: Self-pay | Admitting: "Endocrinology

## 2015-04-20 ENCOUNTER — Other Ambulatory Visit: Payer: Self-pay | Admitting: "Endocrinology

## 2015-05-16 ENCOUNTER — Other Ambulatory Visit: Payer: Self-pay | Admitting: "Endocrinology

## 2015-05-17 LAB — TSH+FREE T4
FREE T4: 1.08 ng/dL (ref 0.82–1.77)
TSH: 2.28 u[IU]/mL (ref 0.450–4.500)

## 2015-05-17 LAB — HGB A1C W/O EAG: Hgb A1c MFr Bld: 7.1 % — ABNORMAL HIGH (ref 4.8–5.6)

## 2015-06-05 ENCOUNTER — Encounter: Payer: Self-pay | Admitting: Internal Medicine

## 2015-06-05 NOTE — Progress Notes (Unsigned)
Patient ID: [name], male   DOB: 07-Apr-1941, 74 y.o.   MRN: [id number] reveiwed cc'd labs ordered and followed by [facility],  Cbc, lfts, ok; cre up but chronic.  Hard copy to be filed in epic

## 2015-06-15 ENCOUNTER — Ambulatory Visit (INDEPENDENT_AMBULATORY_CARE_PROVIDER_SITE_OTHER): Payer: Medicare Other | Admitting: "Endocrinology

## 2015-06-15 ENCOUNTER — Encounter: Payer: Self-pay | Admitting: "Endocrinology

## 2015-06-15 VITALS — BP 116/84 | HR 86 | Ht 69.5 in | Wt 261.0 lb

## 2015-06-15 DIAGNOSIS — E785 Hyperlipidemia, unspecified: Secondary | ICD-10-CM | POA: Diagnosis not present

## 2015-06-15 DIAGNOSIS — I10 Essential (primary) hypertension: Secondary | ICD-10-CM

## 2015-06-15 DIAGNOSIS — E118 Type 2 diabetes mellitus with unspecified complications: Secondary | ICD-10-CM

## 2015-06-15 DIAGNOSIS — E039 Hypothyroidism, unspecified: Secondary | ICD-10-CM | POA: Diagnosis not present

## 2015-06-15 DIAGNOSIS — Z794 Long term (current) use of insulin: Secondary | ICD-10-CM

## 2015-06-15 DIAGNOSIS — E1165 Type 2 diabetes mellitus with hyperglycemia: Secondary | ICD-10-CM

## 2015-06-15 DIAGNOSIS — IMO0002 Reserved for concepts with insufficient information to code with codable children: Secondary | ICD-10-CM

## 2015-06-15 DIAGNOSIS — N183 Chronic kidney disease, stage 3 unspecified: Secondary | ICD-10-CM | POA: Insufficient documentation

## 2015-06-15 DIAGNOSIS — E782 Mixed hyperlipidemia: Secondary | ICD-10-CM | POA: Insufficient documentation

## 2015-06-15 DIAGNOSIS — E1122 Type 2 diabetes mellitus with diabetic chronic kidney disease: Secondary | ICD-10-CM | POA: Insufficient documentation

## 2015-06-15 MED ORDER — INSULIN GLARGINE 100 UNIT/ML SOLOSTAR PEN: 32.0000 [IU] | PEN_INJECTOR | Freq: Every day | SUBCUTANEOUS | Status: DC

## 2015-06-15 MED ORDER — INSULIN ASPART 100 UNIT/ML FLEXPEN: 12.0000 [IU] | PEN_INJECTOR | Freq: Three times a day (TID) | SUBCUTANEOUS | Status: DC

## 2015-06-15 MED ORDER — GLUCOSE BLOOD VI STRP: ORAL_STRIP | Status: DC

## 2015-06-15 NOTE — Progress Notes (Signed)
Subjective:    Patient ID: [name], male    DOB: January 07, 1941, PCP Purvis Kilts, MD   Past Medical History  Diagnosis Date  . Hypothyroidism   . HTN (hypertension)   . CRI (chronic renal insufficiency)   . DM (diabetes mellitus) ([place])     Type II  . Anemia   . NASH (nonalcoholic steatohepatitis) 2006    liver transplant  . Squamous cell cancer of external ear     pinna  . S/P colonoscopy with polypectomy 2004    Dr Gala Romney  . S/P endoscopy 03/2004    Hx esophgaeal varices, pyloric channel ulcer prior to transplant  . History of ERCP 03/21/2011    Extrahepatic biliary obstruction secondary to occluded biliary stent and colon no cholelithiasis, status post sphincterotomy, stent removal and replacement and stone extraction.  . Biliary calculi, common bile duct   . Esophageal varices ([place]) 03/19/11    mrcp  . Pancreatitis chronic 03/19/11    mrcp  . Elevated liver enzymes    Past Surgical History  Procedure Laterality Date  . Liver transplant  2006    UVA-NASH cirrhosis  . Mouth surgery  may 2012  . Ercp  03/21/2011    Dr. Gala Romney- ERCP with removal of pediatric feeding tube, endoscopic sphinecterotomy with balloon dilation of ampulla followed by stone extraction and stent placement  . Sphincterotomy  03/21/2011    Procedure: SPHINCTEROTOMY;  Surgeon: Daneil Dolin, MD;  Location: [facility];  Service: Gastroenterology;;  . Mrcp  03/19/11    biliary ductal dilatation- stones. esophageal varices  . Bile duct stent placement  03/21/11    Dr. Gala Romney  . Esophagogastroduodenoscopy  03/20/2004    Dr. Nash Mantis 2 esophageal varices o/w normal esophageal mucosa, mild changes of snake skin in the gastric mucosa consistent with portal gastropathy, multiple antral erosions  and  a single pyloric channel ulcer benign appearance o/w normal gastric mucosa, normal D1 and S2  . Colonoscopy  08/12/2002    Dr. Gala Romney- somewhat diffusely friable rectum and colon, polyp at 30 cm.  . Colonoscopy  N/A 09/08/2012    Procedure: COLONOSCOPY;  Surgeon: Daneil Dolin, MD;  Location: [facility];  Service: Endoscopy;  Laterality: N/A;  10:30  . Cholecystectomy  2006  . Knee arthroscopy with medial menisectomy Left 11/21/2012    Procedure: KNEE ARTHROSCOPY WITH MEDIAL AND LATERAL MENISECTOMY;  Surgeon: Carole Civil, MD;  Location: [facility];  Service: Orthopedics;  Laterality: Left;  . Knee arthroscopy with excision plica Left 0000000    Procedure: KNEE ARTHROSCOPY WITH EXCISION PLICA;  Surgeon: Carole Civil, MD;  Location: [facility];  Service: Orthopedics;  Laterality: Left;   Social History   Social History  . Marital Status: Married    Spouse Name: N/A  . Number of Children: 1  . Years of Education: 14   Occupational History  . RETIRED   .     Social History Main Topics  . Smoking status: Never Smoker   . Smokeless tobacco: Never Used  . Alcohol Use: No  . Drug Use: No  . Sexual Activity: Not Asked   Other Topics Concern  . None   Social History Narrative   Outpatient Encounter Prescriptions as of 06/15/2015  Medication Sig  . aspirin 81 MG tablet Take 81 mg by mouth daily.    Marland Kitchen docusate sodium (COLACE) 100 MG capsule Take 100 mg by mouth at bedtime.  Marland Kitchen glucose blood (ONE TOUCH  ULTRA TEST) test strip Use as instructed  . insulin aspart (NOVOLOG) 100 UNIT/ML FlexPen Inject 12-18 Units into the skin 3 (three) times daily with meals.  . Insulin Glargine (LANTUS SOLOSTAR) 100 UNIT/ML Solostar Pen Inject 32 Units into the skin daily at 10 pm.  . levothyroxine (SYNTHROID, LEVOTHROID) 100 MCG tablet Take 112 mcg by mouth daily.  Marland Kitchen lisinopril (PRINIVIL,ZESTRIL) 20 MG tablet Take 20 mg by mouth daily.   . Multiple Vitamins-Minerals (MULTIVITAMIN WITH MINERALS) tablet Take 1 tablet by mouth daily.    Marland Kitchen oxyCODONE-acetaminophen (PERCOCET/ROXICET) 5-325 MG per tablet Take 1 tablet by mouth every 4 (four) hours as needed for pain.  . predniSONE (DELTASONE) 10 MG tablet Take 10  mg by mouth as needed.  Marland Kitchen Specialty Vitamins Products (MAGNESIUM, AMINO ACID CHELATE,) 133 MG tablet Take 1 tablet by mouth 2 (two) times daily. Magnesium 133mg    . tacrolimus (PROGRAF) 1 MG capsule Take 2-3 mg by mouth 2 (two) times daily. Takes 3 pills every morning and 2 pills at supper  . [DISCONTINUED] insulin lispro (HUMALOG KWIKPEN) 100 UNIT/ML KiwkPen Inject into the skin 3 (three) times daily.  . [DISCONTINUED] insulin aspart (NOVOLOG) 100 UNIT/ML injection Inject 10-16 Units into the skin 3 (three) times daily before meals. Based on insulin sliding scale.  . [DISCONTINUED] insulin detemir (LEVEMIR) 100 UNIT/ML injection Inject 30 Units into the skin at bedtime.   . [DISCONTINUED] LANTUS SOLOSTAR 100 UNIT/ML Solostar Pen INJECT 30 UNITS SUBCUTANEOUSLY EVERY NIGHT AT BEDTIME  . [DISCONTINUED] mycophenolate (CELLCEPT) 250 MG capsule Take 500 mg by mouth 2 (two) times daily.   . [DISCONTINUED] oxyCODONE-acetaminophen (PERCOCET/ROXICET) 5-325 MG per tablet Take 1 tablet by mouth every 4 (four) hours as needed for pain.   No facility-administered encounter medications on file as of 06/15/2015.   ALLERGIES: Allergies  Allergen Reactions  . Insulin Glargine Rash    Itching, reaction required epinephrine shot  . Contrast Media [Iodinated Diagnostic Agents] Rash   VACCINATION STATUS: Immunization History  Administered Date(s) Administered  . Influenza-Unspecified 04/08/2013  . Pneumococcal Polysaccharide-23 03/17/2010    Diabetes He presents for his follow-up diabetic visit. He has type 2 diabetes mellitus. Onset time: He was diagnosed at approximate age of 2 years. His disease course has been stable. There are no hypoglycemic associated symptoms. Pertinent negatives for hypoglycemia include no confusion, headaches, pallor or seizures. There are no diabetic associated symptoms. Pertinent negatives for diabetes include no chest pain, no fatigue, no polydipsia, no polyphagia, no polyuria and  no weakness. There are no hypoglycemic complications. Symptoms are stable. There are no diabetic complications. Risk factors for coronary artery disease include diabetes mellitus, dyslipidemia, hypertension, male sex, obesity, sedentary lifestyle and tobacco exposure. Current diabetic treatment includes intensive insulin program. He is compliant with treatment most of the time. His weight is stable. He is following a generally unhealthy diet. He rarely participates in exercise. His home blood glucose trend is fluctuating minimally. His breakfast blood glucose range is generally 140-180 mg/dl. His lunch blood glucose range is generally 140-180 mg/dl. His dinner blood glucose range is generally 140-180 mg/dl. His overall blood glucose range is 140-180 mg/dl. An ACE inhibitor/angiotensin II receptor blocker is being taken.  Hyperlipidemia This is a chronic problem. The current episode started more than 1 year ago. Pertinent negatives include no chest pain, myalgias or shortness of breath. He is currently on no antihyperlipidemic treatment.  Hypertension This is a chronic problem. The current episode started more than 1 year ago. Pertinent negatives include  no chest pain, headaches, neck pain, palpitations or shortness of breath. Past treatments include ACE inhibitors. Hypertensive end-organ damage includes a thyroid problem. He is status post liver transplant..  Thyroid Problem Presents for follow-up visit. Patient reports no constipation, diarrhea, fatigue or palpitations. The symptoms have been improving. Past treatments include levothyroxine. The following procedures have not been performed: thyroidectomy. His past medical history is significant for hyperlipidemia.     Review of Systems  Constitutional: Negative for fatigue and unexpected weight change.  HENT: Negative for dental problem, mouth sores and trouble swallowing.   Eyes: Negative for visual disturbance.  Respiratory: Negative for cough,  choking, chest tightness, shortness of breath and wheezing.   Cardiovascular: Negative for chest pain, palpitations and leg swelling.  Gastrointestinal: Negative for nausea, vomiting, abdominal pain, diarrhea, constipation and abdominal distention.  Endocrine: Negative for polydipsia, polyphagia and polyuria.  Genitourinary: Negative for dysuria, urgency, hematuria and flank pain.  Musculoskeletal: Negative for myalgias, back pain, gait problem and neck pain.  Skin: Negative for pallor, rash and wound.  Neurological: Negative for seizures, syncope, weakness, numbness and headaches.  Psychiatric/Behavioral: Negative.  Negative for confusion and dysphoric mood.    Objective:    BP 116/84 mmHg  Pulse 86  Ht 5' 9.5" (1.765 m)  Wt 261 lb (118.389 kg)  BMI 38.00 kg/m2  SpO2 95%  Wt Readings from Last 3 Encounters:  06/15/15 261 lb (118.389 kg)  12/16/12 250 lb (113.399 kg)  11/24/12 250 lb (113.399 kg)    Physical Exam  Constitutional: He is oriented to person, place, and time. He appears well-developed and well-nourished. He is cooperative. No distress.  HENT:  Head: Normocephalic and atraumatic.  Eyes: EOM are normal.  Neck: Normal range of motion. Neck supple. No tracheal deviation present. No thyromegaly present.  Cardiovascular: Normal rate, S1 normal, S2 normal and normal heart sounds.  Exam reveals no gallop.   No murmur heard. Pulses:      Dorsalis pedis pulses are 1+ on the right side, and 1+ on the left side.       Posterior tibial pulses are 1+ on the right side, and 1+ on the left side.  Pulmonary/Chest: Breath sounds normal. No respiratory distress. He has no wheezes.  Abdominal: Soft. Bowel sounds are normal. He exhibits no distension. There is no tenderness. There is no guarding and no CVA tenderness.  Musculoskeletal: He exhibits no edema.       Right shoulder: He exhibits no swelling and no deformity.  Neurological: He is alert and oriented to person, place, and  time. He has normal strength and normal reflexes. No cranial nerve deficit or sensory deficit. Gait normal.  Skin: Skin is warm and dry. No rash noted. No cyanosis. Nails show no clubbing.  Psychiatric: He has a normal mood and affect. His speech is normal and behavior is normal. Judgment and thought content normal. Cognition and memory are normal.    Results for orders placed or performed in visit on 05/16/15  TSH + free T4  Result Value Ref Range   TSH 2.280 0.450 - 4.500 uIU/mL   Free T4 1.08 0.82 - 1.77 ng/dL  Hgb A1c w/o eAG  Result Value Ref Range   Hgb A1c MFr Bld 7.1 (H) 4.8 - 5.6 %   Complete Blood Count (Most recent): Lab Results  Component Value Date   WBC 11.4* 03/20/2011   HGB 14.7 11/17/2012   HCT 42.4 11/17/2012   MCV 91.7 03/20/2011   PLT 242 03/20/2011  Chemistry (most recent): Lab Results  Component Value Date   NA 140 11/17/2012   K 4.3 11/17/2012   CL 105 11/17/2012   CO2 26 11/17/2012   BUN 25* 11/17/2012   CREATININE 1.46* 11/17/2012   Diabetic Labs (most recent): Lab Results  Component Value Date   HGBA1C 7.1* 05/16/2015   HGBA1C 7.2* 02/08/2015   HGBA1C 9.6* 03/20/2011     Assessment & Plan:   1. Uncontrolled type 2 diabetes mellitus with complication, with long-term current use of insulin ([place])  - patient remains at a high risk for more acute and chronic complications of diabetes which include CAD, CVA, CKD, retinopathy, and neuropathy. These are all discussed in detail with the patient.  Patient came with controlled glucose profile, and  recent A1c of 7.1 %.  Glucose logs and insulin administration records pertaining to this visit,  to be scanned into patient's records.  Recent labs reviewed.   - I have re-counseled the patient on diet management   by adopting a carbohydrate restricted / protein rich  Diet.  - Suggestion is made for patient to avoid simple carbohydrates   from their diet including Cakes , Desserts, Ice Cream,  Soda (   diet and regular) , Sweet Tea , Candies,  Chips, Cookies, Artificial Sweeteners,   and "Sugar-free" Products .  This will help patient to have stable blood glucose profile and potentially avoid unintended  Weight gain.  - Patient is advised to stick to a routine mealtimes to eat 3 meals  a day and avoid unnecessary snacks ( to snack only to correct hypoglycemia).  - I have approached patient with the following individualized plan to manage diabetes and patient agrees.  -I will continue Lantus  32 units qhs, and Novolog 12 units TIDAC plus SSI. - He will continue to monitor BG ac and HS. I advised him to contact me for hypoglycemia or hyperglycemia above 200.  -Patient is not a candidate for metformin, SGLT2 inhibitors, and incretin in therapy due to history of liver transplant.   - Patient specific target  for A1c; LDL, HDL, Triglycerides, and  Waist Circumference were discussed in detail.  2) BP/HTN: Controlled. Continue current medications including ACEI/ARB. 3) Lipids/HPL:  Controlled, not on statins. 4)  Weight/Diet:  exercise, and carbohydrates information provided. 5) Primary hypothyroidism:  He is clinically euthyroid and his thyroid function tests are consistent with appropriate replacement. I will continue levothyroxine 112 g by mouth every morning.  - We discussed about correct intake of levothyroxine, at fasting, with water, separated by at least 30 minutes from breakfast, and separated by more than 4 hours from calcium, iron, multivitamins, acid reflux medications (PPIs). -Patient is made aware of the fact that thyroid hormone replacement is needed for life, dose to be adjusted by periodic monitoring of thyroid function tests.  5) Chronic Care/Health Maintenance:  -Patient is on ACEI/ARB  medications and encouraged to continue to follow up with Ophthalmology, Podiatrist at least yearly or according to recommendations, and advised to  stay away from smoking. I have recommended  yearly flu vaccine and pneumonia vaccination at least every 5 years; and  sleep for at least 7 hours a day.  - 25 minutes of time was spent on the care of this patient , 50% of which was applied for counseling on diabetes complications and their preventions.  - I advised patient to maintain close follow up with Purvis Kilts, MD for primary care needs.  Patient is asked to bring  meter and  blood glucose logs during their next visit.   Follow up plan: -Return in about 3 months (around 09/13/2015) for diabetes, high blood pressure, high cholesterol, underactive thyroid, follow up with pre-visit labs, meter, and logs.  Glade Lloyd, MD Phone: [phone]  Fax: [phone]   06/15/2015, 10:46 AM

## 2015-06-15 NOTE — Patient Instructions (Signed)

## 2015-07-22 ENCOUNTER — Other Ambulatory Visit: Payer: Self-pay

## 2015-07-22 DIAGNOSIS — Z1389 Encounter for screening for other disorder: Secondary | ICD-10-CM | POA: Diagnosis not present

## 2015-07-22 DIAGNOSIS — J209 Acute bronchitis, unspecified: Secondary | ICD-10-CM | POA: Diagnosis not present

## 2015-07-22 DIAGNOSIS — J069 Acute upper respiratory infection, unspecified: Secondary | ICD-10-CM | POA: Diagnosis not present

## 2015-07-22 DIAGNOSIS — Z6839 Body mass index (BMI) 39.0-39.9, adult: Secondary | ICD-10-CM | POA: Diagnosis not present

## 2015-07-22 MED ORDER — INSULIN ASPART 100 UNIT/ML FLEXPEN: 12.0000 [IU] | PEN_INJECTOR | Freq: Three times a day (TID) | SUBCUTANEOUS | Status: DC

## 2015-07-22 MED ORDER — GLUCOSE BLOOD VI STRP: ORAL_STRIP | Status: DC

## 2015-07-22 MED ORDER — INSULIN GLARGINE 100 UNIT/ML SOLOSTAR PEN: 32.0000 [IU] | PEN_INJECTOR | Freq: Every day | SUBCUTANEOUS | Status: DC

## 2015-07-27 DIAGNOSIS — L080 Pyoderma: Secondary | ICD-10-CM | POA: Diagnosis not present

## 2015-07-27 DIAGNOSIS — L281 Prurigo nodularis: Secondary | ICD-10-CM | POA: Diagnosis not present

## 2015-08-01 ENCOUNTER — Telehealth: Payer: Self-pay | Admitting: "Endocrinology

## 2015-08-01 MED ORDER — GLUCOSE BLOOD VI STRP: ORAL_STRIP | Status: DC

## 2015-08-01 NOTE — Telephone Encounter (Signed)
mail order strips aren't covered. Please send 1 touch ulta strips to [facility] for 4x a day Thanks!

## 2015-08-02 ENCOUNTER — Other Ambulatory Visit: Payer: Self-pay | Admitting: "Endocrinology

## 2015-08-04 DIAGNOSIS — Z48298 Encounter for aftercare following other organ transplant: Secondary | ICD-10-CM | POA: Diagnosis not present

## 2015-08-04 DIAGNOSIS — Z944 Liver transplant status: Secondary | ICD-10-CM | POA: Diagnosis not present

## 2015-08-04 DIAGNOSIS — Z79899 Other long term (current) drug therapy: Secondary | ICD-10-CM | POA: Diagnosis not present

## 2015-08-15 DIAGNOSIS — Z1389 Encounter for screening for other disorder: Secondary | ICD-10-CM | POA: Diagnosis not present

## 2015-08-15 DIAGNOSIS — Z6839 Body mass index (BMI) 39.0-39.9, adult: Secondary | ICD-10-CM | POA: Diagnosis not present

## 2015-08-15 DIAGNOSIS — Z79899 Other long term (current) drug therapy: Secondary | ICD-10-CM | POA: Diagnosis not present

## 2015-08-15 DIAGNOSIS — J069 Acute upper respiratory infection, unspecified: Secondary | ICD-10-CM | POA: Diagnosis not present

## 2015-08-15 DIAGNOSIS — Z944 Liver transplant status: Secondary | ICD-10-CM | POA: Diagnosis not present

## 2015-08-15 DIAGNOSIS — J209 Acute bronchitis, unspecified: Secondary | ICD-10-CM | POA: Diagnosis not present

## 2015-08-15 DIAGNOSIS — Z48298 Encounter for aftercare following other organ transplant: Secondary | ICD-10-CM | POA: Diagnosis not present

## 2015-08-16 LAB — TSH: TSH: 3.38 u[IU]/mL (ref 0.450–4.500)

## 2015-08-16 LAB — T4, FREE: FREE T4: 1.12 ng/dL (ref 0.82–1.77)

## 2015-08-16 LAB — HEMOGLOBIN A1C
ESTIMATED AVERAGE GLUCOSE: 186 mg/dL
Hgb A1c MFr Bld: 8.1 % — ABNORMAL HIGH (ref 4.8–5.6)

## 2015-09-21 ENCOUNTER — Ambulatory Visit (INDEPENDENT_AMBULATORY_CARE_PROVIDER_SITE_OTHER): Payer: PPO | Admitting: "Endocrinology

## 2015-09-21 ENCOUNTER — Encounter: Payer: Self-pay | Admitting: "Endocrinology

## 2015-09-21 VITALS — BP 137/78 | HR 85 | Ht 69.5 in | Wt 263.0 lb

## 2015-09-21 DIAGNOSIS — Z794 Long term (current) use of insulin: Secondary | ICD-10-CM | POA: Diagnosis not present

## 2015-09-21 DIAGNOSIS — E1165 Type 2 diabetes mellitus with hyperglycemia: Secondary | ICD-10-CM | POA: Diagnosis not present

## 2015-09-21 DIAGNOSIS — E118 Type 2 diabetes mellitus with unspecified complications: Secondary | ICD-10-CM

## 2015-09-21 DIAGNOSIS — E039 Hypothyroidism, unspecified: Secondary | ICD-10-CM | POA: Diagnosis not present

## 2015-09-21 DIAGNOSIS — I10 Essential (primary) hypertension: Secondary | ICD-10-CM | POA: Diagnosis not present

## 2015-09-21 DIAGNOSIS — E785 Hyperlipidemia, unspecified: Secondary | ICD-10-CM

## 2015-09-21 DIAGNOSIS — IMO0002 Reserved for concepts with insufficient information to code with codable children: Secondary | ICD-10-CM

## 2015-09-21 MED ORDER — INSULIN GLARGINE 100 UNIT/ML SOLOSTAR PEN: 32.0000 [IU] | PEN_INJECTOR | Freq: Every day | SUBCUTANEOUS | Status: DC

## 2015-09-21 MED ORDER — INSULIN ASPART 100 UNIT/ML FLEXPEN: 12.0000 [IU] | PEN_INJECTOR | Freq: Three times a day (TID) | SUBCUTANEOUS | Status: DC

## 2015-09-21 MED ORDER — LEVOTHYROXINE SODIUM 112 MCG PO TABS: 112.0000 ug | ORAL_TABLET | Freq: Every day | ORAL | Status: DC

## 2015-09-21 MED ORDER — GLUCOSE BLOOD VI STRP: ORAL_STRIP | Status: DC

## 2015-09-21 NOTE — Patient Instructions (Signed)

## 2015-09-21 NOTE — Progress Notes (Signed)
Subjective:    Patient ID: [name], male    DOB: Sep 14, 1940, PCP Purvis Kilts, MD   Past Medical History  Diagnosis Date  . Hypothyroidism   . HTN (hypertension)   . CRI (chronic renal insufficiency)   . DM (diabetes mellitus) ([place])     Type II  . Anemia   . NASH (nonalcoholic steatohepatitis) 2006    liver transplant  . Squamous cell cancer of external ear     pinna  . S/P colonoscopy with polypectomy 2004    Dr Gala Romney  . S/P endoscopy 03/2004    Hx esophgaeal varices, pyloric channel ulcer prior to transplant  . History of ERCP 03/21/2011    Extrahepatic biliary obstruction secondary to occluded biliary stent and colon no cholelithiasis, status post sphincterotomy, stent removal and replacement and stone extraction.  . Biliary calculi, common bile duct   . Esophageal varices ([place]) 03/19/11    mrcp  . Pancreatitis chronic 03/19/11    mrcp  . Elevated liver enzymes    Past Surgical History  Procedure Laterality Date  . Liver transplant  2006    UVA-NASH cirrhosis  . Mouth surgery  may 2012  . Ercp  03/21/2011    Dr. Gala Romney- ERCP with removal of pediatric feeding tube, endoscopic sphinecterotomy with balloon dilation of ampulla followed by stone extraction and stent placement  . Sphincterotomy  03/21/2011    Procedure: SPHINCTEROTOMY;  Surgeon: Daneil Dolin, MD;  Location: [facility];  Service: Gastroenterology;;  . Mrcp  03/19/11    biliary ductal dilatation- stones. esophageal varices  . Bile duct stent placement  03/21/11    Dr. Gala Romney  . Esophagogastroduodenoscopy  03/20/2004    Dr. Nash Mantis 2 esophageal varices o/w normal esophageal mucosa, mild changes of snake skin in the gastric mucosa consistent with portal gastropathy, multiple antral erosions  and  a single pyloric channel ulcer benign appearance o/w normal gastric mucosa, normal D1 and S2  . Colonoscopy  08/12/2002    Dr. Gala Romney- somewhat diffusely friable rectum and colon, polyp at 30 cm.  . Colonoscopy  N/A 09/08/2012    Procedure: COLONOSCOPY;  Surgeon: Daneil Dolin, MD;  Location: [facility];  Service: Endoscopy;  Laterality: N/A;  10:30  . Cholecystectomy  2006  . Knee arthroscopy with medial menisectomy Left 11/21/2012    Procedure: KNEE ARTHROSCOPY WITH MEDIAL AND LATERAL MENISECTOMY;  Surgeon: Carole Civil, MD;  Location: [facility];  Service: Orthopedics;  Laterality: Left;  . Knee arthroscopy with excision plica Left 0000000    Procedure: KNEE ARTHROSCOPY WITH EXCISION PLICA;  Surgeon: Carole Civil, MD;  Location: [facility];  Service: Orthopedics;  Laterality: Left;   Social History   Social History  . Marital Status: Married    Spouse Name: N/A  . Number of Children: 1  . Years of Education: 14   Occupational History  . RETIRED   .     Social History Main Topics  . Smoking status: Never Smoker   . Smokeless tobacco: Never Used  . Alcohol Use: No  . Drug Use: No  . Sexual Activity: Not Asked   Other Topics Concern  . None   Social History Narrative   Outpatient Encounter Prescriptions as of 09/21/2015  Medication Sig  . aspirin 81 MG tablet Take 81 mg by mouth daily.    Marland Kitchen docusate sodium (COLACE) 100 MG capsule Take 100 mg by mouth at bedtime.  Marland Kitchen glucose blood (ONE TOUCH  ULTRA TEST) test strip USE AS DIRECTED TO TEST BLOOD SUGAR 4 TIMES DAILY.  Marland Kitchen insulin aspart (NOVOLOG) 100 UNIT/ML FlexPen Inject 12-18 Units into the skin 3 (three) times daily with meals.  . Insulin Glargine (LANTUS SOLOSTAR) 100 UNIT/ML Solostar Pen Inject 32 Units into the skin daily at 10 pm.  . levothyroxine (SYNTHROID, LEVOTHROID) 112 MCG tablet Take 1 tablet (112 mcg total) by mouth daily.  Marland Kitchen lisinopril (PRINIVIL,ZESTRIL) 20 MG tablet Take 20 mg by mouth daily.   . Multiple Vitamins-Minerals (MULTIVITAMIN WITH MINERALS) tablet Take 1 tablet by mouth daily.    Marland Kitchen oxyCODONE-acetaminophen (PERCOCET/ROXICET) 5-325 MG per tablet Take 1 tablet by mouth every 4 (four) hours as needed for  pain.  . predniSONE (DELTASONE) 10 MG tablet Take 10 mg by mouth as needed.  Marland Kitchen Specialty Vitamins Products (MAGNESIUM, AMINO ACID CHELATE,) 133 MG tablet Take 1 tablet by mouth 2 (two) times daily. Magnesium 133mg    . tacrolimus (PROGRAF) 1 MG capsule Take 2-3 mg by mouth 2 (two) times daily. Takes 3 pills every morning and 2 pills at supper  . [DISCONTINUED] insulin aspart (NOVOLOG) 100 UNIT/ML FlexPen Inject 12-18 Units into the skin 3 (three) times daily with meals.  . [DISCONTINUED] Insulin Glargine (LANTUS SOLOSTAR) 100 UNIT/ML Solostar Pen Inject 32 Units into the skin daily at 10 pm.  . [DISCONTINUED] levothyroxine (SYNTHROID, LEVOTHROID) 100 MCG tablet Take 112 mcg by mouth daily.  . [DISCONTINUED] ONE TOUCH ULTRA TEST test strip USE AS DIRECTED TO TEST BLOOD SUGAR 4 TIMES DAILY.   No facility-administered encounter medications on file as of 09/21/2015.   ALLERGIES: Allergies  Allergen Reactions  . Insulin Glargine Rash    Itching, reaction required epinephrine shot  . Contrast Media [Iodinated Diagnostic Agents] Rash   VACCINATION STATUS: Immunization History  Administered Date(s) Administered  . Influenza-Unspecified 04/08/2013  . Pneumococcal Polysaccharide-23 03/17/2010    Diabetes He presents for his follow-up diabetic visit. He has type 2 diabetes mellitus. Onset time: He was diagnosed at approximate age of 73 years. His disease course has been stable. There are no hypoglycemic associated symptoms. Pertinent negatives for hypoglycemia include no confusion, headaches, pallor or seizures. There are no diabetic associated symptoms. Pertinent negatives for diabetes include no chest pain, no fatigue, no polydipsia, no polyphagia, no polyuria and no weakness. There are no hypoglycemic complications. Symptoms are stable. There are no diabetic complications. Risk factors for coronary artery disease include diabetes mellitus, dyslipidemia, hypertension, male sex, obesity, sedentary  lifestyle and tobacco exposure. Current diabetic treatment includes intensive insulin program. He is compliant with treatment most of the time. His weight is stable. He is following a generally unhealthy diet. He rarely participates in exercise. His home blood glucose trend is fluctuating minimally. His breakfast blood glucose range is generally 140-180 mg/dl. His lunch blood glucose range is generally 140-180 mg/dl. His dinner blood glucose range is generally 140-180 mg/dl. His overall blood glucose range is 140-180 mg/dl. An ACE inhibitor/angiotensin II receptor blocker is being taken.  Hyperlipidemia This is a chronic problem. The current episode started more than 1 year ago. Pertinent negatives include no chest pain, myalgias or shortness of breath. He is currently on no antihyperlipidemic treatment.  Hypertension This is a chronic problem. The current episode started more than 1 year ago. Pertinent negatives include no chest pain, headaches, neck pain, palpitations or shortness of breath. Past treatments include ACE inhibitors. Hypertensive end-organ damage includes a thyroid problem. He is status post liver transplant..  Thyroid Problem Presents  for follow-up visit. Patient reports no constipation, diarrhea, fatigue or palpitations. The symptoms have been improving. Past treatments include levothyroxine. The following procedures have not been performed: thyroidectomy. His past medical history is significant for hyperlipidemia.     Review of Systems  Constitutional: Negative for fatigue and unexpected weight change.  HENT: Negative for dental problem, mouth sores and trouble swallowing.   Eyes: Negative for visual disturbance.  Respiratory: Negative for cough, choking, chest tightness, shortness of breath and wheezing.   Cardiovascular: Negative for chest pain, palpitations and leg swelling.  Gastrointestinal: Negative for nausea, vomiting, abdominal pain, diarrhea, constipation and abdominal  distention.  Endocrine: Negative for polydipsia, polyphagia and polyuria.  Genitourinary: Negative for dysuria, urgency, hematuria and flank pain.  Musculoskeletal: Negative for myalgias, back pain, gait problem and neck pain.  Skin: Negative for pallor, rash and wound.  Neurological: Negative for seizures, syncope, weakness, numbness and headaches.  Psychiatric/Behavioral: Negative.  Negative for confusion and dysphoric mood.    Objective:    BP 137/78 mmHg  Pulse 85  Ht 5' 9.5" (1.765 m)  Wt 263 lb (119.296 kg)  BMI 38.29 kg/m2  SpO2 95%  Wt Readings from Last 3 Encounters:  09/21/15 263 lb (119.296 kg)  06/15/15 261 lb (118.389 kg)  12/16/12 250 lb (113.399 kg)    Physical Exam  Constitutional: He is oriented to person, place, and time. He appears well-developed and well-nourished. He is cooperative. No distress.  HENT:  Head: Normocephalic and atraumatic.  Eyes: EOM are normal.  Neck: Normal range of motion. Neck supple. No tracheal deviation present. No thyromegaly present.  Cardiovascular: Normal rate, S1 normal, S2 normal and normal heart sounds.  Exam reveals no gallop.   No murmur heard. Pulses:      Dorsalis pedis pulses are 1+ on the right side, and 1+ on the left side.       Posterior tibial pulses are 1+ on the right side, and 1+ on the left side.  Pulmonary/Chest: Breath sounds normal. No respiratory distress. He has no wheezes.  Abdominal: Soft. Bowel sounds are normal. He exhibits no distension. There is no tenderness. There is no guarding and no CVA tenderness.  Musculoskeletal: He exhibits no edema.       Right shoulder: He exhibits no swelling and no deformity.  Neurological: He is alert and oriented to person, place, and time. He has normal strength and normal reflexes. No cranial nerve deficit or sensory deficit. Gait normal.  Skin: Skin is warm and dry. No rash noted. No cyanosis. Nails show no clubbing.  Psychiatric: He has a normal mood and affect. His  speech is normal and behavior is normal. Judgment and thought content normal. Cognition and memory are normal.    Results for orders placed or performed in visit on 06/15/15  Hemoglobin A1c  Result Value Ref Range   Hgb A1c MFr Bld 8.1 (H) 4.8 - 5.6 %   Est. average glucose Bld gHb Est-mCnc 186 mg/dL  T4, free  Result Value Ref Range   Free T4 1.12 0.82 - 1.77 ng/dL  TSH  Result Value Ref Range   TSH 3.380 0.450 - 4.500 uIU/mL   Diabetic Labs (most recent): Lab Results  Component Value Date   HGBA1C 8.1* 08/15/2015   HGBA1C 7.1* 05/16/2015   HGBA1C 7.2* 02/08/2015    Results for REINOLD, FOGLEMAN (MRN [id number]) as of 09/21/2015 09:19  Ref. Range 05/16/2015 07:28 08/15/2015 07:30  TSH Latest Ref Range: 0.450-4.500 uIU/mL 2.280 3.380  T4,Free(Direct) Latest Ref Range: 0.82-1.77 ng/dL 1.08 1.12     Assessment & Plan:   1. Uncontrolled type 2 diabetes mellitus with complication, with long-term current use of insulin ([place])  - patient remains at a high risk for more acute and chronic complications of diabetes which include CAD, CVA, CKD, retinopathy, and neuropathy. These are all discussed in detail with the patient.  Patient came with A1c of 8.1% increasing from 7.1 %. This is mainly due to multiple episodes of upper respiratory tract infections since his last visit. His most recent blood glucose readings are near target.  Glucose logs and insulin administration records pertaining to this visit,  to be scanned into patient's records.  Recent labs reviewed.   - I have re-counseled the patient on diet management   by adopting a carbohydrate restricted / protein rich  Diet.  - Suggestion is made for patient to avoid simple carbohydrates   from their diet including Cakes , Desserts, Ice Cream,  Soda (  diet and regular) , Sweet Tea , Candies,  Chips, Cookies, Artificial Sweeteners,   and "Sugar-free" Products .  This will help patient to have stable blood glucose profile and potentially  avoid unintended  Weight gain.  - Patient is advised to stick to a routine mealtimes to eat 3 meals  a day and avoid unnecessary snacks ( to snack only to correct hypoglycemia).  - I have approached patient with the following individualized plan to manage diabetes and patient agrees.  -I will continue Lantus  32 units qhs, and Novolog 12 units TIDAC plus SSI. - He will continue to monitor BG ac and HS. I advised him to contact me for hypoglycemia or hyperglycemia above 200.  -Patient is not a candidate for metformin, SGLT2 inhibitors, and incretin in therapy due to history of liver transplant.   - Patient specific target  for A1c; LDL, HDL, Triglycerides, and  Waist Circumference were discussed in detail.  2) BP/HTN: Controlled. Continue current medications including ACEI/ARB. 3) Lipids/HPL:  Controlled, not on statins. 4)  Weight/Diet:  exercise, and carbohydrates information provided. 5) Primary hypothyroidism:  He is clinically euthyroid and his thyroid function tests are consistent with appropriate replacement. I will continue levothyroxine 112 g by mouth every morning.  - We discussed about correct intake of levothyroxine, at fasting, with water, separated by at least 30 minutes from breakfast, and separated by more than 4 hours from calcium, iron, multivitamins, acid reflux medications (PPIs). -Patient is made aware of the fact that thyroid hormone replacement is needed for life, dose to be adjusted by periodic monitoring of thyroid function tests.  5) Chronic Care/Health Maintenance:  -Patient is on ACEI/ARB  medications and encouraged to continue to follow up with Ophthalmology, Podiatrist at least yearly or according to recommendations, and advised to  stay away from smoking. I have recommended yearly flu vaccine and pneumonia vaccination at least every 5 years; and  sleep for at least 7 hours a day.  - 25 minutes of time was spent on the care of this patient , 50% of which was  applied for counseling on diabetes complications and their preventions.  - I advised patient to maintain close follow up with Purvis Kilts, MD for primary care needs.  Patient is asked to bring meter and  blood glucose logs during their next visit.   Follow up plan: -Return in about 3 months (around 12/22/2015) for diabetes, high blood pressure, high cholesterol, underactive thyroid, follow up with pre-visit labs,  meter, and logs.  Glade Lloyd, MD Phone: [phone]  Fax: [phone]   09/21/2015, 9:18 AM

## 2015-09-30 ENCOUNTER — Encounter: Payer: Self-pay | Admitting: "Endocrinology

## 2015-11-14 ENCOUNTER — Other Ambulatory Visit: Payer: Self-pay | Admitting: "Endocrinology

## 2015-11-14 DIAGNOSIS — Z79899 Other long term (current) drug therapy: Secondary | ICD-10-CM | POA: Diagnosis not present

## 2015-11-14 DIAGNOSIS — Z48298 Encounter for aftercare following other organ transplant: Secondary | ICD-10-CM | POA: Diagnosis not present

## 2015-11-14 DIAGNOSIS — E1165 Type 2 diabetes mellitus with hyperglycemia: Secondary | ICD-10-CM | POA: Diagnosis not present

## 2015-11-14 DIAGNOSIS — Z794 Long term (current) use of insulin: Secondary | ICD-10-CM | POA: Diagnosis not present

## 2015-11-14 DIAGNOSIS — Z944 Liver transplant status: Secondary | ICD-10-CM | POA: Diagnosis not present

## 2015-11-14 DIAGNOSIS — E039 Hypothyroidism, unspecified: Secondary | ICD-10-CM | POA: Diagnosis not present

## 2015-11-14 DIAGNOSIS — E118 Type 2 diabetes mellitus with unspecified complications: Secondary | ICD-10-CM | POA: Diagnosis not present

## 2015-11-15 LAB — T4, FREE: Free T4: 1.07 ng/dL (ref 0.82–1.77)

## 2015-11-15 LAB — TSH: TSH: 3.13 u[IU]/mL (ref 0.450–4.500)

## 2015-11-15 LAB — HGB A1C W/O EAG: HEMOGLOBIN A1C: 7.5 % — AB (ref 4.8–5.6)

## 2015-12-28 ENCOUNTER — Ambulatory Visit (INDEPENDENT_AMBULATORY_CARE_PROVIDER_SITE_OTHER): Payer: PPO | Admitting: "Endocrinology

## 2015-12-28 ENCOUNTER — Other Ambulatory Visit: Payer: Self-pay

## 2015-12-28 ENCOUNTER — Encounter: Payer: Self-pay | Admitting: "Endocrinology

## 2015-12-28 VITALS — BP 137/86 | HR 80 | Ht 69.5 in | Wt 259.0 lb

## 2015-12-28 DIAGNOSIS — E118 Type 2 diabetes mellitus with unspecified complications: Secondary | ICD-10-CM

## 2015-12-28 DIAGNOSIS — E785 Hyperlipidemia, unspecified: Secondary | ICD-10-CM | POA: Diagnosis not present

## 2015-12-28 DIAGNOSIS — I10 Essential (primary) hypertension: Secondary | ICD-10-CM | POA: Diagnosis not present

## 2015-12-28 DIAGNOSIS — E1165 Type 2 diabetes mellitus with hyperglycemia: Secondary | ICD-10-CM

## 2015-12-28 DIAGNOSIS — Z794 Long term (current) use of insulin: Secondary | ICD-10-CM | POA: Diagnosis not present

## 2015-12-28 DIAGNOSIS — IMO0002 Reserved for concepts with insufficient information to code with codable children: Secondary | ICD-10-CM

## 2015-12-28 DIAGNOSIS — E039 Hypothyroidism, unspecified: Secondary | ICD-10-CM

## 2015-12-28 MED ORDER — GLUCOSE BLOOD VI STRP: ORAL_STRIP | Status: DC

## 2015-12-28 NOTE — Progress Notes (Signed)
Subjective:    Patient ID: [name], male    DOB: 01-15-1941, PCP Kyle Kilts, MD   Past Medical History  Diagnosis Date  . Hypothyroidism   . HTN (hypertension)   . CRI (chronic renal insufficiency)   . DM (diabetes mellitus) ([place])     Type II  . Anemia   . NASH (nonalcoholic steatohepatitis) 2006    liver transplant  . Squamous cell cancer of external ear     pinna  . S/P colonoscopy with polypectomy 2004    Dr Gala Romney  . S/P endoscopy 03/2004    Hx esophgaeal varices, pyloric channel ulcer prior to transplant  . History of ERCP 03/21/2011    Extrahepatic biliary obstruction secondary to occluded biliary stent and colon no cholelithiasis, status post sphincterotomy, stent removal and replacement and stone extraction.  . Biliary calculi, common bile duct   . Esophageal varices ([place]) 03/19/11    mrcp  . Pancreatitis chronic 03/19/11    mrcp  . Elevated liver enzymes    Past Surgical History  Procedure Laterality Date  . Liver transplant  2006    UVA-NASH cirrhosis  . Mouth surgery  may 2012  . Ercp  03/21/2011    Dr. Gala Romney- ERCP with removal of pediatric feeding tube, endoscopic sphinecterotomy with balloon dilation of ampulla followed by stone extraction and stent placement  . Sphincterotomy  03/21/2011    Procedure: SPHINCTEROTOMY;  Surgeon: Daneil Dolin, MD;  Location: [facility];  Service: Gastroenterology;;  . Mrcp  03/19/11    biliary ductal dilatation- stones. esophageal varices  . Bile duct stent placement  03/21/11    Dr. Gala Romney  . Esophagogastroduodenoscopy  03/20/2004    Dr. Nash Mantis 2 esophageal varices o/w normal esophageal mucosa, mild changes of snake skin in the gastric mucosa consistent with portal gastropathy, multiple antral erosions  and  a single pyloric channel ulcer benign appearance o/w normal gastric mucosa, normal D1 and S2  . Colonoscopy  08/12/2002    Dr. Gala Romney- somewhat diffusely friable rectum and colon, polyp at 30 cm.  . Colonoscopy  N/A 09/08/2012    Procedure: COLONOSCOPY;  Surgeon: Daneil Dolin, MD;  Location: [facility];  Service: Endoscopy;  Laterality: N/A;  10:30  . Cholecystectomy  2006  . Knee arthroscopy with medial menisectomy Left 11/21/2012    Procedure: KNEE ARTHROSCOPY WITH MEDIAL AND LATERAL MENISECTOMY;  Surgeon: Carole Civil, MD;  Location: [facility];  Service: Orthopedics;  Laterality: Left;  . Knee arthroscopy with excision plica Left 0000000    Procedure: KNEE ARTHROSCOPY WITH EXCISION PLICA;  Surgeon: Carole Civil, MD;  Location: [facility];  Service: Orthopedics;  Laterality: Left;   Social History   Social History  . Marital Status: Married    Spouse Name: N/A  . Number of Children: 1  . Years of Education: 14   Occupational History  . RETIRED   .     Social History Main Topics  . Smoking status: Never Smoker   . Smokeless tobacco: Never Used  . Alcohol Use: No  . Drug Use: No  . Sexual Activity: Not Asked   Other Topics Concern  . None   Social History Narrative   Outpatient Encounter Prescriptions as of 12/28/2015  Medication Sig  . aspirin 81 MG tablet Take 81 mg by mouth daily.    Marland Kitchen docusate sodium (COLACE) 100 MG capsule Take 100 mg by mouth at bedtime.  . insulin aspart (NOVOLOG) 100  UNIT/ML FlexPen Inject 12-18 Units into the skin 3 (three) times daily with meals.  . Insulin Glargine (LANTUS SOLOSTAR) 100 UNIT/ML Solostar Pen Inject 32 Units into the skin daily at 10 pm.  . levothyroxine (SYNTHROID, LEVOTHROID) 112 MCG tablet Take 1 tablet (112 mcg total) by mouth daily.  Marland Kitchen lisinopril (PRINIVIL,ZESTRIL) 20 MG tablet Take 20 mg by mouth daily.   . Multiple Vitamins-Minerals (MULTIVITAMIN WITH MINERALS) tablet Take 1 tablet by mouth daily.    Marland Kitchen oxyCODONE-acetaminophen (PERCOCET/ROXICET) 5-325 MG per tablet Take 1 tablet by mouth every 4 (four) hours as needed for pain.  . predniSONE (DELTASONE) 10 MG tablet Take 10 mg by mouth as needed.  Marland Kitchen Specialty Vitamins  Products (MAGNESIUM, AMINO ACID CHELATE,) 133 MG tablet Take 1 tablet by mouth 2 (two) times daily. Magnesium 133mg    . tacrolimus (PROGRAF) 1 MG capsule Take 2-3 mg by mouth 2 (two) times daily. Takes 3 pills every morning and 2 pills at supper  . [DISCONTINUED] glucose blood (ONE TOUCH ULTRA TEST) test strip USE AS DIRECTED TO TEST BLOOD SUGAR 4 TIMES DAILY.   No facility-administered encounter medications on file as of 12/28/2015.   ALLERGIES: Allergies  Allergen Reactions  . Insulin Glargine Rash    Itching, reaction required epinephrine shot  . Contrast Media [Iodinated Diagnostic Agents] Rash   VACCINATION STATUS: Immunization History  Administered Date(s) Administered  . Influenza-Unspecified 04/08/2013  . Pneumococcal Polysaccharide-23 03/17/2010    Diabetes He presents for his follow-up diabetic visit. He has type 2 diabetes mellitus. Onset time: He was diagnosed at approximate age of 10 years. His disease course has been improving. There are no hypoglycemic associated symptoms. Pertinent negatives for hypoglycemia include no confusion, headaches, pallor or seizures. There are no diabetic associated symptoms. Pertinent negatives for diabetes include no chest pain, no fatigue, no polydipsia, no polyphagia, no polyuria and no weakness. There are no hypoglycemic complications. Symptoms are improving. There are no diabetic complications. Risk factors for coronary artery disease include diabetes mellitus, dyslipidemia, hypertension, male sex, obesity, sedentary lifestyle and tobacco exposure. Current diabetic treatment includes intensive insulin program. He is compliant with treatment most of the time. His weight is stable. He is following a generally unhealthy diet. He rarely participates in exercise. His home blood glucose trend is decreasing steadily. His breakfast blood glucose range is generally 140-180 mg/dl. His lunch blood glucose range is generally 140-180 mg/dl. His dinner blood  glucose range is generally 140-180 mg/dl. His overall blood glucose range is 140-180 mg/dl. An ACE inhibitor/angiotensin II receptor blocker is being taken.  Hyperlipidemia This is a chronic problem. The current episode started more than 1 year ago. Pertinent negatives include no chest pain, myalgias or shortness of breath. He is currently on no antihyperlipidemic treatment.  Hypertension This is a chronic problem. The current episode started more than 1 year ago. Pertinent negatives include no chest pain, headaches, neck pain, palpitations or shortness of breath. Past treatments include ACE inhibitors. Hypertensive end-organ damage includes a thyroid problem. He is status post liver transplant..  Thyroid Problem Presents for follow-up visit. Patient reports no constipation, diarrhea, fatigue or palpitations. The symptoms have been improving. Past treatments include levothyroxine. The following procedures have not been performed: thyroidectomy. His past medical history is significant for hyperlipidemia.     Review of Systems  Constitutional: Negative for fatigue and unexpected weight change.  HENT: Negative for dental problem, mouth sores and trouble swallowing.   Eyes: Negative for visual disturbance.  Respiratory: Negative for cough,  choking, chest tightness, shortness of breath and wheezing.   Cardiovascular: Negative for chest pain, palpitations and leg swelling.  Gastrointestinal: Negative for nausea, vomiting, abdominal pain, diarrhea, constipation and abdominal distention.  Endocrine: Negative for polydipsia, polyphagia and polyuria.  Genitourinary: Negative for dysuria, urgency, hematuria and flank pain.  Musculoskeletal: Negative for myalgias, back pain, gait problem and neck pain.  Skin: Negative for pallor, rash and wound.  Neurological: Negative for seizures, syncope, weakness, numbness and headaches.  Psychiatric/Behavioral: Negative.  Negative for confusion and dysphoric mood.     Objective:    BP 137/86 mmHg  Pulse 80  Ht 5' 9.5" (1.765 m)  Wt 259 lb (117.482 kg)  BMI 37.71 kg/m2  Wt Readings from Last 3 Encounters:  12/28/15 259 lb (117.482 kg)  09/21/15 263 lb (119.296 kg)  06/15/15 261 lb (118.389 kg)    Physical Exam  Constitutional: He is oriented to person, place, and time. He appears well-developed and well-nourished. He is cooperative. No distress.  HENT:  Head: Normocephalic and atraumatic.  Eyes: EOM are normal.  Neck: Normal range of motion. Neck supple. No tracheal deviation present. No thyromegaly present.  Cardiovascular: Normal rate, S1 normal, S2 normal and normal heart sounds.  Exam reveals no gallop.   No murmur heard. Pulses:      Dorsalis pedis pulses are 1+ on the right side, and 1+ on the left side.       Posterior tibial pulses are 1+ on the right side, and 1+ on the left side.  Pulmonary/Chest: Breath sounds normal. No respiratory distress. He has no wheezes.  Abdominal: Soft. Bowel sounds are normal. He exhibits no distension. There is no tenderness. There is no guarding and no CVA tenderness.  Musculoskeletal: He exhibits no edema.       Right shoulder: He exhibits no swelling and no deformity.  Neurological: He is alert and oriented to person, place, and time. He has normal strength and normal reflexes. No cranial nerve deficit or sensory deficit. Gait normal.  Skin: Skin is warm and dry. No rash noted. No cyanosis. Nails show no clubbing.  Psychiatric: He has a normal mood and affect. His speech is normal and behavior is normal. Judgment and thought content normal. Cognition and memory are normal.    Results for orders placed or performed in visit on 11/14/15  Hgb A1c w/o eAG  Result Value Ref Range   Hgb A1c MFr Bld 7.5 (H) 4.8 - 5.6 %  T4, free  Result Value Ref Range   Free T4 1.07 0.82 - 1.77 ng/dL  TSH  Result Value Ref Range   TSH 3.130 0.450 - 4.500 uIU/mL   Diabetic Labs (most recent): Lab Results   Component Value Date   HGBA1C 7.5* 11/14/2015   HGBA1C 8.1* 08/15/2015   HGBA1C 7.1* 05/16/2015      Assessment & Plan:   1. Uncontrolled type 2 diabetes mellitus with complication, with long-term current use of insulin ([place])  - patient remains at a high risk for more acute and chronic complications of diabetes which include CAD, CVA, CKD, retinopathy, and neuropathy. These are all discussed in detail with the patient.  Patient came with A1c of  7.5% improving from 8.1%. This is mainly due to multiple episodes of upper respiratory tract infections since his last visit. His most recent blood glucose readings are near target.  Glucose logs and insulin administration records pertaining to this visit,  to be scanned into patient's records.  Recent labs reviewed.   -  I have re-counseled the patient on diet management   by adopting a carbohydrate restricted / protein rich  Diet.  - Suggestion is made for patient to avoid simple carbohydrates   from their diet including Cakes , Desserts, Ice Cream,  Soda (  diet and regular) , Sweet Tea , Candies,  Chips, Cookies, Artificial Sweeteners,   and "Sugar-free" Products .  This will help patient to have stable blood glucose profile and potentially avoid unintended  Weight gain.  - Patient is advised to stick to a routine mealtimes to eat 3 meals  a day and avoid unnecessary snacks ( to snack only to correct hypoglycemia).  - I have approached patient with the following individualized plan to manage diabetes and patient agrees.  -I will continue Lantus  32 units qhs, and Novolog 12 units TIDAC plus SSI. - He will continue to monitor BG ac and HS. I advised him to contact me for hypoglycemia or hyperglycemia above 200.  -Patient is not a candidate for metformin, SGLT2 inhibitors, and incretin in therapy due to history of liver transplant.   - Patient specific target  for A1c; LDL, HDL, Triglycerides, and  Waist Circumference were discussed in  detail.  2) BP/HTN: Controlled. Continue current medications including ACEI/ARB. 3) Lipids/HPL:  Controlled, not on statins. 4)  Weight/Diet:  exercise, and carbohydrates information provided.  5) Primary hypothyroidism:  He is clinically euthyroid and his thyroid function tests are consistent with appropriate replacement. I will continue levothyroxine 112 g by mouth every morning.  - We discussed about correct intake of levothyroxine, at fasting, with water, separated by at least 30 minutes from breakfast, and separated by more than 4 hours from calcium, iron, multivitamins, acid reflux medications (PPIs). -Patient is made aware of the fact that thyroid hormone replacement is needed for life, dose to be adjusted by periodic monitoring of thyroid function tests.  5) Chronic Care/Health Maintenance:  -Patient is on ACEI/ARB  medications and encouraged to continue to follow up with Ophthalmology, Podiatrist at least yearly or according to recommendations, and advised to  stay away from smoking. I have recommended yearly flu vaccine and pneumonia vaccination at least every 5 years; and  sleep for at least 7 hours a day.  - 25 minutes of time was spent on the care of this patient , 50% of which was applied for counseling on diabetes complications and their preventions.  - I advised patient to maintain close follow up with Kyle Kilts, MD for primary care needs.  Patient is asked to bring meter and  blood glucose logs during their next visit.   Follow up plan: -Return in about 3 months (around 03/29/2016) for follow up with pre-visit labs, meter, and logs.  Glade Lloyd, MD Phone: [phone]  Fax: [phone]   12/28/2015, 10:17 AM

## 2015-12-28 NOTE — Patient Instructions (Signed)
Advice for weight management -For most of us the best way to lose weight is by diet management. Generally speaking, diet management means restricting carbohydrate consumption to minimum possible (and to unprocessed or minimally processed complex starch) and increasing protein intake (animal or plant source), fruits, and vegetables.  -Sticking to a routine mealtime to eat 3 meals a day and avoiding unnecessary snacks is shown to have a big role in weight control.  -It is better to avoid simple carbohydrates including: Cakes, Desserts, Ice Cream, Soda (diet and regular), Sweet Tea, Candies, Chips, Cookies, Artificial Sweeteners, and "Sugar-free" Products.   -Exercise: 30 minutes a day 3-4 days a week, or 150 minutes a week. Combine stretch, strength, and aerobic activities. You may seek evaluation by your heart doctor prior to initiating exercise if you have high risk for heart disease.  -If you are interested, we can schedule a visit with [name], RDN, CDE for individualized nutrition education.  

## 2016-02-13 ENCOUNTER — Other Ambulatory Visit: Payer: Self-pay | Admitting: "Endocrinology

## 2016-02-13 DIAGNOSIS — Z79899 Other long term (current) drug therapy: Secondary | ICD-10-CM | POA: Diagnosis not present

## 2016-02-13 DIAGNOSIS — Z48298 Encounter for aftercare following other organ transplant: Secondary | ICD-10-CM | POA: Diagnosis not present

## 2016-02-13 DIAGNOSIS — Z944 Liver transplant status: Secondary | ICD-10-CM | POA: Diagnosis not present

## 2016-02-14 LAB — HGB A1C W/O EAG: HEMOGLOBIN A1C: 7.3 % — AB (ref 4.8–5.6)

## 2016-03-26 ENCOUNTER — Encounter: Payer: Self-pay | Admitting: "Endocrinology

## 2016-03-26 ENCOUNTER — Ambulatory Visit (INDEPENDENT_AMBULATORY_CARE_PROVIDER_SITE_OTHER): Payer: PPO | Admitting: "Endocrinology

## 2016-03-26 VITALS — BP 138/84 | HR 88 | Ht 69.5 in | Wt 257.0 lb

## 2016-03-26 DIAGNOSIS — Z794 Long term (current) use of insulin: Secondary | ICD-10-CM

## 2016-03-26 DIAGNOSIS — IMO0002 Reserved for concepts with insufficient information to code with codable children: Secondary | ICD-10-CM

## 2016-03-26 DIAGNOSIS — E118 Type 2 diabetes mellitus with unspecified complications: Secondary | ICD-10-CM

## 2016-03-26 DIAGNOSIS — E039 Hypothyroidism, unspecified: Secondary | ICD-10-CM

## 2016-03-26 DIAGNOSIS — E1165 Type 2 diabetes mellitus with hyperglycemia: Secondary | ICD-10-CM | POA: Diagnosis not present

## 2016-03-26 DIAGNOSIS — E785 Hyperlipidemia, unspecified: Secondary | ICD-10-CM

## 2016-03-26 MED ORDER — GLUCOSE BLOOD VI STRP: ORAL_STRIP | 1 refills | Status: DC

## 2016-03-26 NOTE — Progress Notes (Signed)
Subjective:    Patient ID: [name], male    DOB: 1940/11/23, PCP Purvis Kilts, MD   Past Medical History:  Diagnosis Date  . Anemia   . Biliary calculi, common bile duct   . CRI (chronic renal insufficiency)   . DM (diabetes mellitus) ([place])    Type II  . Elevated liver enzymes   . Esophageal varices ([place]) 03/19/11   mrcp  . History of ERCP 03/21/2011   Extrahepatic biliary obstruction secondary to occluded biliary stent and colon no cholelithiasis, status post sphincterotomy, stent removal and replacement and stone extraction.  Marland Kitchen HTN (hypertension)   . Hypothyroidism   . NASH (nonalcoholic steatohepatitis) 2006   liver transplant  . Pancreatitis chronic 03/19/11   mrcp  . S/P colonoscopy with polypectomy 2004   Dr Gala Romney  . S/P endoscopy 03/2004   Hx esophgaeal varices, pyloric channel ulcer prior to transplant  . Squamous cell cancer of external ear    pinna   Past Surgical History:  Procedure Laterality Date  . BILE DUCT STENT PLACEMENT  03/21/11   Dr. Gala Romney  . CHOLECYSTECTOMY  2006  . COLONOSCOPY  08/12/2002   Dr. Gala Romney- somewhat diffusely friable rectum and colon, polyp at 30 cm.  . COLONOSCOPY N/A 09/08/2012   Procedure: COLONOSCOPY;  Surgeon: Daneil Dolin, MD;  Location: [facility];  Service: Endoscopy;  Laterality: N/A;  10:30  . ERCP  03/21/2011   Dr. Gala Romney- ERCP with removal of pediatric feeding tube, endoscopic sphinecterotomy with balloon dilation of ampulla followed by stone extraction and stent placement  . ESOPHAGOGASTRODUODENOSCOPY  03/20/2004   Dr. Nash Mantis 2 esophageal varices o/w normal esophageal mucosa, mild changes of snake skin in the gastric mucosa consistent with portal gastropathy, multiple antral erosions  and  a single pyloric channel ulcer benign appearance o/w normal gastric mucosa, normal D1 and S2  . KNEE ARTHROSCOPY WITH EXCISION PLICA Left 0000000   Procedure: KNEE ARTHROSCOPY WITH EXCISION PLICA;  Surgeon: Carole Civil, MD;  Location: [facility];  Service: Orthopedics;  Laterality: Left;  . KNEE ARTHROSCOPY WITH MEDIAL MENISECTOMY Left 11/21/2012   Procedure: KNEE ARTHROSCOPY WITH MEDIAL AND LATERAL MENISECTOMY;  Surgeon: Carole Civil, MD;  Location: [facility];  Service: Orthopedics;  Laterality: Left;  . LIVER TRANSPLANT  2006   UVA-NASH cirrhosis  . MOUTH SURGERY  may 2012  . MRCP  03/19/11   biliary ductal dilatation- stones. esophageal varices  . SPHINCTEROTOMY  03/21/2011   Procedure: SPHINCTEROTOMY;  Surgeon: Daneil Dolin, MD;  Location: [facility];  Service: Gastroenterology;;   Social History   Social History  . Marital status: Married    Spouse name: N/A  . Number of children: 1  . Years of education: 40   Occupational History  . RETIRED   .  Retired   Social History Main Topics  . Smoking status: Never Smoker  . Smokeless tobacco: Never Used  . Alcohol use No  . Drug use: No  . Sexual activity: Not Asked   Other Topics Concern  . None   Social History Narrative  . None   Outpatient Encounter Prescriptions as of 03/26/2016  Medication Sig  . aspirin 81 MG tablet Take 81 mg by mouth daily.    Marland Kitchen docusate sodium (COLACE) 100 MG capsule Take 100 mg by mouth at bedtime.  Marland Kitchen glucose blood (ONE TOUCH ULTRA TEST) test strip USE AS DIRECTED TO TEST BLOOD SUGAR 4 TIMES DAILY.  Marland Kitchen  insulin aspart (NOVOLOG) 100 UNIT/ML FlexPen Inject 12-18 Units into the skin 3 (three) times daily with meals.  . Insulin Glargine (LANTUS SOLOSTAR) 100 UNIT/ML Solostar Pen Inject 32 Units into the skin daily at 10 pm.  . levothyroxine (SYNTHROID, LEVOTHROID) 112 MCG tablet Take 1 tablet (112 mcg total) by mouth daily.  Marland Kitchen lisinopril (PRINIVIL,ZESTRIL) 20 MG tablet Take 20 mg by mouth daily.   . Multiple Vitamins-Minerals (MULTIVITAMIN WITH MINERALS) tablet Take 1 tablet by mouth daily.    Marland Kitchen oxyCODONE-acetaminophen (PERCOCET/ROXICET) 5-325 MG per tablet Take 1 tablet by mouth every 4 (four) hours as needed for  pain.  . predniSONE (DELTASONE) 10 MG tablet Take 10 mg by mouth as needed.  Marland Kitchen Specialty Vitamins Products (MAGNESIUM, AMINO ACID CHELATE,) 133 MG tablet Take 1 tablet by mouth 2 (two) times daily. Magnesium 133mg    . tacrolimus (PROGRAF) 1 MG capsule Take 2-3 mg by mouth 2 (two) times daily. Takes 3 pills every morning and 2 pills at supper  . [DISCONTINUED] glucose blood (ONE TOUCH ULTRA TEST) test strip USE AS DIRECTED TO TEST BLOOD SUGAR 4 TIMES DAILY.   No facility-administered encounter medications on file as of 03/26/2016.    ALLERGIES: Allergies  Allergen Reactions  . Insulin Glargine Rash    Itching, reaction required epinephrine shot  . Contrast Media [Iodinated Diagnostic Agents] Rash   VACCINATION STATUS: Immunization History  Administered Date(s) Administered  . Influenza-Unspecified 04/08/2013  . Pneumococcal Polysaccharide-23 03/17/2010    Diabetes  He presents for his follow-up diabetic visit. He has type 2 diabetes mellitus. Onset time: He was diagnosed at approximate age of 58 years. His disease course has been improving. There are no hypoglycemic associated symptoms. Pertinent negatives for hypoglycemia include no confusion, headaches, pallor or seizures. There are no diabetic associated symptoms. Pertinent negatives for diabetes include no chest pain, no fatigue, no polydipsia, no polyphagia, no polyuria and no weakness. There are no hypoglycemic complications. Symptoms are improving. There are no diabetic complications. Risk factors for coronary artery disease include diabetes mellitus, dyslipidemia, hypertension, male sex, obesity, sedentary lifestyle and tobacco exposure. Current diabetic treatment includes intensive insulin program. He is compliant with treatment most of the time. His weight is stable. He is following a generally unhealthy diet. He rarely participates in exercise. His home blood glucose trend is decreasing steadily. His breakfast blood glucose range is  generally 140-180 mg/dl. His lunch blood glucose range is generally 140-180 mg/dl. His dinner blood glucose range is generally 140-180 mg/dl. His overall blood glucose range is 140-180 mg/dl. An ACE inhibitor/angiotensin II receptor blocker is being taken.  Hyperlipidemia  This is a chronic problem. The current episode started more than 1 year ago. Pertinent negatives include no chest pain, myalgias or shortness of breath. He is currently on no antihyperlipidemic treatment.  Hypertension  This is a chronic problem. The current episode started more than 1 year ago. Pertinent negatives include no chest pain, headaches, neck pain, palpitations or shortness of breath. Past treatments include ACE inhibitors. Hypertensive end-organ damage includes a thyroid problem. He is status post liver transplant..  Thyroid Problem  Presents for follow-up visit. Patient reports no constipation, diarrhea, fatigue or palpitations. The symptoms have been improving. Past treatments include levothyroxine. The following procedures have not been performed: thyroidectomy. His past medical history is significant for hyperlipidemia.     Review of Systems  Constitutional: Negative for fatigue and unexpected weight change.  HENT: Negative for dental problem, mouth sores and trouble swallowing.   Eyes:  Negative for visual disturbance.  Respiratory: Negative for cough, choking, chest tightness, shortness of breath and wheezing.   Cardiovascular: Negative for chest pain, palpitations and leg swelling.  Gastrointestinal: Negative for abdominal distention, abdominal pain, constipation, diarrhea, nausea and vomiting.  Endocrine: Negative for polydipsia, polyphagia and polyuria.  Genitourinary: Negative for dysuria, flank pain, hematuria and urgency.  Musculoskeletal: Negative for back pain, gait problem, myalgias and neck pain.  Skin: Negative for pallor, rash and wound.  Neurological: Negative for seizures, syncope, weakness,  numbness and headaches.  Psychiatric/Behavioral: Negative.  Negative for confusion and dysphoric mood.    Objective:    BP 138/84   Pulse 88   Ht 5' 9.5" (1.765 m)   Wt 257 lb (116.6 kg)   BMI 37.41 kg/m   Wt Readings from Last 3 Encounters:  03/26/16 257 lb (116.6 kg)  12/28/15 259 lb (117.5 kg)  09/21/15 263 lb (119.3 kg)    Physical Exam  Constitutional: He is oriented to person, place, and time. He appears well-developed and well-nourished. He is cooperative. No distress.  HENT:  Head: Normocephalic and atraumatic.  Eyes: EOM are normal.  Neck: Normal range of motion. Neck supple. No tracheal deviation present. No thyromegaly present.  Cardiovascular: Normal rate, S1 normal, S2 normal and normal heart sounds.  Exam reveals no gallop.   No murmur heard. Pulses:      Dorsalis pedis pulses are 1+ on the right side, and 1+ on the left side.       Posterior tibial pulses are 1+ on the right side, and 1+ on the left side.  Pulmonary/Chest: Breath sounds normal. No respiratory distress. He has no wheezes.  Abdominal: Soft. Bowel sounds are normal. He exhibits no distension. There is no tenderness. There is no guarding and no CVA tenderness.  Musculoskeletal: He exhibits no edema.       Right shoulder: He exhibits no swelling and no deformity.  Neurological: He is alert and oriented to person, place, and time. He has normal strength and normal reflexes. No cranial nerve deficit or sensory deficit. Gait normal.  Skin: Skin is warm and dry. No rash noted. No cyanosis. Nails show no clubbing.  Psychiatric: He has a normal mood and affect. His speech is normal and behavior is normal. Judgment and thought content normal. Cognition and memory are normal.    Results for orders placed or performed in visit on 02/13/16  Hgb A1c w/o eAG  Result Value Ref Range   Hgb A1c MFr Bld 7.3 (H) 4.8 - 5.6 %   Diabetic Labs (most recent): Lab Results  Component Value Date   HGBA1C 7.3 (H)  02/13/2016   HGBA1C 7.5 (H) 11/14/2015   HGBA1C 8.1 (H) 08/15/2015      Assessment & Plan:   1. Uncontrolled type 2 diabetes mellitus with complication, with long-term current use of insulin ([place])  - patient remains at a high risk for more acute and chronic complications of diabetes which include CAD, CVA, CKD, retinopathy, and neuropathy. These are all discussed in detail with the patient.  Patient came with A1c of  7.3% improving from 8.1%. This is mainly due to multiple episodes of upper respiratory tract infections since his last visit. His most recent blood glucose readings are near target.  Glucose logs and insulin administration records pertaining to this visit,  to be scanned into patient's records.  Recent labs reviewed.   - I have re-counseled the patient on diet management   by adopting a carbohydrate  restricted / protein rich  Diet.  - Suggestion is made for patient to avoid simple carbohydrates   from their diet including Cakes , Desserts, Ice Cream,  Soda (  diet and regular) , Sweet Tea , Candies,  Chips, Cookies, Artificial Sweeteners,   and "Sugar-free" Products .  This will help patient to have stable blood glucose profile and potentially avoid unintended  Weight gain.  - Patient is advised to stick to a routine mealtimes to eat 3 meals  a day and avoid unnecessary snacks ( to snack only to correct hypoglycemia).  - I have approached patient with the following individualized plan to manage diabetes and patient agrees.  -I will continue Lantus  32 units qhs, and Novolog 12 units TIDAC plus SSI. - He will continue to monitor BG ac and HS. I advised him to contact me for hypoglycemia or hyperglycemia above 200.  -Patient is not a candidate for metformin, SGLT2 inhibitors, and incretin in therapy due to history of liver transplant.   - Patient specific target  for A1c; LDL, HDL, Triglycerides, and  Waist Circumference were discussed in detail.  2) BP/HTN: Controlled.  Continue current medications including ACEI/ARB. 3) Lipids/HPL:  Controlled, not on statins. 4)  Weight/Diet:  exercise, and carbohydrates information provided.  5) Primary hypothyroidism:  He is clinically euthyroid and his thyroid function tests are consistent with appropriate replacement. I will continue levothyroxine 112 g by mouth every morning.  - We discussed about correct intake of levothyroxine, at fasting, with water, separated by at least 30 minutes from breakfast, and separated by more than 4 hours from calcium, iron, multivitamins, acid reflux medications (PPIs). -Patient is made aware of the fact that thyroid hormone replacement is needed for life, dose to be adjusted by periodic monitoring of thyroid function tests.  5) Chronic Care/Health Maintenance:  -Patient is on ACEI/ARB  medications and encouraged to continue to follow up with Ophthalmology, Podiatrist at least yearly or according to recommendations, and advised to  stay away from smoking. I have recommended yearly flu vaccine and pneumonia vaccination at least every 5 years; and  sleep for at least 7 hours a day.  - 25 minutes of time was spent on the care of this patient , 50% of which was applied for counseling on diabetes complications and their preventions.  - I advised patient to maintain close follow up with Purvis Kilts, MD for primary care needs.  Patient is asked to bring meter and  blood glucose logs during their next visit.   Follow up plan: -Return in about 3 months (around 06/25/2016) for follow up with pre-visit labs, meter, and logs.  Glade Lloyd, MD Phone: [phone]  Fax: [phone]   03/26/2016, 3:49 PM

## 2016-04-03 ENCOUNTER — Ambulatory Visit: Payer: PPO | Admitting: "Endocrinology

## 2016-04-06 DIAGNOSIS — E119 Type 2 diabetes mellitus without complications: Secondary | ICD-10-CM | POA: Diagnosis not present

## 2016-04-06 DIAGNOSIS — H2513 Age-related nuclear cataract, bilateral: Secondary | ICD-10-CM | POA: Diagnosis not present

## 2016-05-14 DIAGNOSIS — Z79899 Other long term (current) drug therapy: Secondary | ICD-10-CM | POA: Diagnosis not present

## 2016-05-14 DIAGNOSIS — Z944 Liver transplant status: Secondary | ICD-10-CM | POA: Diagnosis not present

## 2016-05-14 DIAGNOSIS — E118 Type 2 diabetes mellitus with unspecified complications: Secondary | ICD-10-CM | POA: Diagnosis not present

## 2016-05-14 DIAGNOSIS — E1165 Type 2 diabetes mellitus with hyperglycemia: Secondary | ICD-10-CM | POA: Diagnosis not present

## 2016-05-14 DIAGNOSIS — Z48298 Encounter for aftercare following other organ transplant: Secondary | ICD-10-CM | POA: Diagnosis not present

## 2016-05-14 DIAGNOSIS — Z794 Long term (current) use of insulin: Secondary | ICD-10-CM | POA: Diagnosis not present

## 2016-05-15 LAB — HEMOGLOBIN A1C
Est. average glucose Bld gHb Est-mCnc: 151 mg/dL
HEMOGLOBIN A1C: 6.9 % — AB (ref 4.8–5.6)

## 2016-05-15 LAB — COMPREHENSIVE METABOLIC PANEL
A/G RATIO: 1.2 (ref 1.2–2.2)
ALK PHOS: 71 IU/L (ref 39–117)
ALT: 16 IU/L (ref 0–44)
AST: 18 IU/L (ref 0–40)
Albumin: 3.7 g/dL (ref 3.5–4.8)
BILIRUBIN TOTAL: 0.5 mg/dL (ref 0.0–1.2)
BUN/Creatinine Ratio: 17 (ref 10–24)
BUN: 21 mg/dL (ref 8–27)
CHLORIDE: 103 mmol/L (ref 96–106)
CO2: 24 mmol/L (ref 18–29)
Calcium: 8.9 mg/dL (ref 8.6–10.2)
Creatinine, Ser: 1.21 mg/dL (ref 0.76–1.27)
GFR calc Af Amer: 67 mL/min/{1.73_m2} (ref >59)
GFR calc non Af Amer: 58 mL/min/{1.73_m2} — ABNORMAL LOW (ref >59)
GLUCOSE: 164 mg/dL — AB (ref 65–99)
Globulin, Total: 3.2 g/dL (ref 1.5–4.5)
POTASSIUM: 5.1 mmol/L (ref 3.5–5.2)
Sodium: 141 mmol/L (ref 134–144)
Total Protein: 6.9 g/dL (ref 6.0–8.5)

## 2016-06-29 ENCOUNTER — Encounter: Payer: Self-pay | Admitting: "Endocrinology

## 2016-06-29 ENCOUNTER — Ambulatory Visit (INDEPENDENT_AMBULATORY_CARE_PROVIDER_SITE_OTHER): Payer: PPO | Admitting: "Endocrinology

## 2016-06-29 VITALS — BP 160/91 | HR 80 | Ht 69.5 in | Wt 264.0 lb

## 2016-06-29 DIAGNOSIS — I10 Essential (primary) hypertension: Secondary | ICD-10-CM | POA: Diagnosis not present

## 2016-06-29 DIAGNOSIS — E782 Mixed hyperlipidemia: Secondary | ICD-10-CM

## 2016-06-29 DIAGNOSIS — IMO0002 Reserved for concepts with insufficient information to code with codable children: Secondary | ICD-10-CM

## 2016-06-29 DIAGNOSIS — Z794 Long term (current) use of insulin: Secondary | ICD-10-CM | POA: Diagnosis not present

## 2016-06-29 DIAGNOSIS — E039 Hypothyroidism, unspecified: Secondary | ICD-10-CM

## 2016-06-29 DIAGNOSIS — Z6837 Body mass index (BMI) 37.0-37.9, adult: Secondary | ICD-10-CM

## 2016-06-29 DIAGNOSIS — E118 Type 2 diabetes mellitus with unspecified complications: Secondary | ICD-10-CM | POA: Diagnosis not present

## 2016-06-29 DIAGNOSIS — E1165 Type 2 diabetes mellitus with hyperglycemia: Secondary | ICD-10-CM | POA: Diagnosis not present

## 2016-06-29 MED ORDER — GLUCOSE BLOOD VI STRP: ORAL_STRIP | 3 refills | Status: DC

## 2016-06-29 NOTE — Progress Notes (Signed)
Subjective:    Patient ID: [name], male    DOB: 03/30/1941, PCP Purvis Kilts, MD   Past Medical History:  Diagnosis Date  . Anemia   . Biliary calculi, common bile duct   . CRI (chronic renal insufficiency)   . DM (diabetes mellitus) ([place])    Type II  . Elevated liver enzymes   . Esophageal varices ([place]) 03/19/11   mrcp  . History of ERCP 03/21/2011   Extrahepatic biliary obstruction secondary to occluded biliary stent and colon no cholelithiasis, status post sphincterotomy, stent removal and replacement and stone extraction.  Marland Kitchen HTN (hypertension)   . Hypothyroidism   . NASH (nonalcoholic steatohepatitis) 2006   liver transplant  . Pancreatitis chronic 03/19/11   mrcp  . S/P colonoscopy with polypectomy 2004   Dr Gala Romney  . S/P endoscopy 03/2004   Hx esophgaeal varices, pyloric channel ulcer prior to transplant  . Squamous cell cancer of external ear    pinna   Past Surgical History:  Procedure Laterality Date  . BILE DUCT STENT PLACEMENT  03/21/11   Dr. Gala Romney  . CHOLECYSTECTOMY  2006  . COLONOSCOPY  08/12/2002   Dr. Gala Romney- somewhat diffusely friable rectum and colon, polyp at 30 cm.  . COLONOSCOPY N/A 09/08/2012   Procedure: COLONOSCOPY;  Surgeon: Daneil Dolin, MD;  Location: [facility];  Service: Endoscopy;  Laterality: N/A;  10:30  . ERCP  03/21/2011   Dr. Gala Romney- ERCP with removal of pediatric feeding tube, endoscopic sphinecterotomy with balloon dilation of ampulla followed by stone extraction and stent placement  . ESOPHAGOGASTRODUODENOSCOPY  03/20/2004   Dr. Nash Mantis 2 esophageal varices o/w normal esophageal mucosa, mild changes of snake skin in the gastric mucosa consistent with portal gastropathy, multiple antral erosions  and  a single pyloric channel ulcer benign appearance o/w normal gastric mucosa, normal D1 and S2  . KNEE ARTHROSCOPY WITH EXCISION PLICA Left 0000000   Procedure: KNEE ARTHROSCOPY WITH EXCISION PLICA;  Surgeon: Carole Civil, MD;  Location: [facility];  Service: Orthopedics;  Laterality: Left;  . KNEE ARTHROSCOPY WITH MEDIAL MENISECTOMY Left 11/21/2012   Procedure: KNEE ARTHROSCOPY WITH MEDIAL AND LATERAL MENISECTOMY;  Surgeon: Carole Civil, MD;  Location: [facility];  Service: Orthopedics;  Laterality: Left;  . LIVER TRANSPLANT  2006   UVA-NASH cirrhosis  . MOUTH SURGERY  may 2012  . MRCP  03/19/11   biliary ductal dilatation- stones. esophageal varices  . SPHINCTEROTOMY  03/21/2011   Procedure: SPHINCTEROTOMY;  Surgeon: Daneil Dolin, MD;  Location: [facility];  Service: Gastroenterology;;   Social History   Social History  . Marital status: Married    Spouse name: N/A  . Number of children: 1  . Years of education: 78   Occupational History  . RETIRED   .  Retired   Social History Main Topics  . Smoking status: Never Smoker  . Smokeless tobacco: Never Used  . Alcohol use No  . Drug use: No  . Sexual activity: Not Asked   Other Topics Concern  . None   Social History Narrative  . None   Outpatient Encounter Prescriptions as of 06/29/2016  Medication Sig  . aspirin 81 MG tablet Take 81 mg by mouth daily.    Marland Kitchen docusate sodium (COLACE) 100 MG capsule Take 100 mg by mouth at bedtime.  Marland Kitchen glucose blood (ONE TOUCH ULTRA TEST) test strip USE AS DIRECTED TO TEST BLOOD SUGAR 4 TIMES DAILY.  Marland Kitchen  glucose blood (ONE TOUCH ULTRA TEST) test strip Use as instructed  . insulin aspart (NOVOLOG) 100 UNIT/ML FlexPen Inject 12-18 Units into the skin 3 (three) times daily with meals.  . Insulin Glargine (LANTUS SOLOSTAR) 100 UNIT/ML Solostar Pen Inject 32 Units into the skin daily at 10 pm.  . levothyroxine (SYNTHROID, LEVOTHROID) 112 MCG tablet Take 1 tablet (112 mcg total) by mouth daily.  Marland Kitchen lisinopril (PRINIVIL,ZESTRIL) 20 MG tablet Take 20 mg by mouth daily.   . Multiple Vitamins-Minerals (MULTIVITAMIN WITH MINERALS) tablet Take 1 tablet by mouth daily.    Marland Kitchen oxyCODONE-acetaminophen (PERCOCET/ROXICET) 5-325  MG per tablet Take 1 tablet by mouth every 4 (four) hours as needed for pain.  . predniSONE (DELTASONE) 10 MG tablet Take 10 mg by mouth as needed.  Marland Kitchen Specialty Vitamins Products (MAGNESIUM, AMINO ACID CHELATE,) 133 MG tablet Take 1 tablet by mouth 2 (two) times daily. Magnesium 133mg    . tacrolimus (PROGRAF) 1 MG capsule Take 2-3 mg by mouth 2 (two) times daily. Takes 3 pills every morning and 2 pills at supper   No facility-administered encounter medications on file as of 06/29/2016.    ALLERGIES: Allergies  Allergen Reactions  . Insulin Glargine Rash    Itching, reaction required epinephrine shot  . Contrast Media [Iodinated Diagnostic Agents] Rash   VACCINATION STATUS: Immunization History  Administered Date(s) Administered  . Influenza-Unspecified 04/08/2013  . Pneumococcal Polysaccharide-23 03/17/2010    Diabetes  He presents for his follow-up diabetic visit. He has type 2 diabetes mellitus. Onset time: He was diagnosed at approximate age of 23 years. His disease course has been improving. There are no hypoglycemic associated symptoms. Pertinent negatives for hypoglycemia include no confusion, headaches, pallor or seizures. There are no diabetic associated symptoms. Pertinent negatives for diabetes include no chest pain, no fatigue, no polydipsia, no polyphagia, no polyuria and no weakness. There are no hypoglycemic complications. Symptoms are improving. There are no diabetic complications. Risk factors for coronary artery disease include diabetes mellitus, dyslipidemia, hypertension, male sex, obesity, sedentary lifestyle and tobacco exposure. Current diabetic treatment includes intensive insulin program. He is compliant with treatment most of the time. His weight is stable. He is following a generally unhealthy diet. He rarely participates in exercise. His home blood glucose trend is decreasing steadily. His breakfast blood glucose range is generally 140-180 mg/dl. His lunch blood  glucose range is generally 140-180 mg/dl. His dinner blood glucose range is generally 140-180 mg/dl. His overall blood glucose range is 140-180 mg/dl. An ACE inhibitor/angiotensin II receptor blocker is being taken.  Hyperlipidemia  This is a chronic problem. The current episode started more than 1 year ago. Pertinent negatives include no chest pain, myalgias or shortness of breath. He is currently on no antihyperlipidemic treatment.  Hypertension  This is a chronic problem. The current episode started more than 1 year ago. Pertinent negatives include no chest pain, headaches, neck pain, palpitations or shortness of breath. Past treatments include ACE inhibitors. Hypertensive end-organ damage includes a thyroid problem. He is status post liver transplant..  Thyroid Problem  Presents for follow-up visit. Patient reports no constipation, diarrhea, fatigue or palpitations. The symptoms have been improving. Past treatments include levothyroxine. The following procedures have not been performed: thyroidectomy. His past medical history is significant for hyperlipidemia.     Review of Systems  Constitutional: Negative for fatigue and unexpected weight change.  HENT: Negative for dental problem, mouth sores and trouble swallowing.   Eyes: Negative for visual disturbance.  Respiratory: Negative for  cough, choking, chest tightness, shortness of breath and wheezing.   Cardiovascular: Negative for chest pain, palpitations and leg swelling.  Gastrointestinal: Negative for abdominal distention, abdominal pain, constipation, diarrhea, nausea and vomiting.  Endocrine: Negative for polydipsia, polyphagia and polyuria.  Genitourinary: Negative for dysuria, flank pain, hematuria and urgency.  Musculoskeletal: Negative for back pain, gait problem, myalgias and neck pain.  Skin: Negative for pallor, rash and wound.  Neurological: Negative for seizures, syncope, weakness, numbness and headaches.   Psychiatric/Behavioral: Negative.  Negative for confusion and dysphoric mood.    Objective:    BP (!) 160/91   Pulse 80   Ht 5' 9.5" (1.765 m)   Wt 264 lb (119.7 kg)   BMI 38.43 kg/m   Wt Readings from Last 3 Encounters:  06/29/16 264 lb (119.7 kg)  03/26/16 257 lb (116.6 kg)  12/28/15 259 lb (117.5 kg)    Physical Exam  Constitutional: He is oriented to person, place, and time. He appears well-developed and well-nourished. He is cooperative. No distress.  HENT:  Head: Normocephalic and atraumatic.  Eyes: EOM are normal.  Neck: Normal range of motion. Neck supple. No tracheal deviation present. No thyromegaly present.  Cardiovascular: Normal rate, S1 normal, S2 normal and normal heart sounds.  Exam reveals no gallop.   No murmur heard. Pulses:      Dorsalis pedis pulses are 1+ on the right side, and 1+ on the left side.       Posterior tibial pulses are 1+ on the right side, and 1+ on the left side.  Pulmonary/Chest: Breath sounds normal. No respiratory distress. He has no wheezes.  Abdominal: Soft. Bowel sounds are normal. He exhibits no distension. There is no tenderness. There is no guarding and no CVA tenderness.  Musculoskeletal: He exhibits no edema.       Right shoulder: He exhibits no swelling and no deformity.  Neurological: He is alert and oriented to person, place, and time. He has normal strength and normal reflexes. No cranial nerve deficit or sensory deficit. Gait normal.  Skin: Skin is warm and dry. No rash noted. No cyanosis. Nails show no clubbing.  Psychiatric: He has a normal mood and affect. His speech is normal and behavior is normal. Judgment and thought content normal. Cognition and memory are normal.    Results for orders placed or performed in visit on 03/26/16  Comprehensive metabolic panel  Result Value Ref Range   Glucose 164 (H) 65 - 99 mg/dL   BUN 21 8 - 27 mg/dL   Creatinine, Ser 1.21 0.76 - 1.27 mg/dL   GFR calc non Af Amer 58 (L) >59  mL/min/1.73   GFR calc Af Amer 67 >59 mL/min/1.73   BUN/Creatinine Ratio 17 10 - 24   Sodium 141 134 - 144 mmol/L   Potassium 5.1 3.5 - 5.2 mmol/L   Chloride 103 96 - 106 mmol/L   CO2 24 18 - 29 mmol/L   Calcium 8.9 8.6 - 10.2 mg/dL   Total Protein 6.9 6.0 - 8.5 g/dL   Albumin 3.7 3.5 - 4.8 g/dL   Globulin, Total 3.2 1.5 - 4.5 g/dL   Albumin/Globulin Ratio 1.2 1.2 - 2.2   Bilirubin Total 0.5 0.0 - 1.2 mg/dL   Alkaline Phosphatase 71 39 - 117 IU/L   AST 18 0 - 40 IU/L   ALT 16 0 - 44 IU/L  Hemoglobin A1c  Result Value Ref Range   Hgb A1c MFr Bld 6.9 (H) 4.8 - 5.6 %  Est. average glucose Bld gHb Est-mCnc 151 mg/dL   Diabetic Labs (most recent): Lab Results  Component Value Date   HGBA1C 6.9 (H) 05/14/2016   HGBA1C 7.3 (H) 02/13/2016   HGBA1C 7.5 (H) 11/14/2015      Assessment & Plan:   1. Uncontrolled type 2 diabetes mellitus with complication, with long-term current use of insulin ([place])  - patient remains at a high risk for more acute and chronic complications of diabetes which include CAD, CVA, CKD, retinopathy, and neuropathy. These are all discussed in detail with the patient.  Patient came with A1c of  6.9% improving from 8.1%. His most recent blood glucose readings are near target.  Glucose logs and insulin administration records pertaining to this visit,  to be scanned into patient's records.  Recent labs reviewed.   - I have re-counseled the patient on diet management   by adopting a carbohydrate restricted / protein rich  Diet.  - Suggestion is made for patient to avoid simple carbohydrates   from their diet including Cakes , Desserts, Ice Cream,  Soda (  diet and regular) , Sweet Tea , Candies,  Chips, Cookies, Artificial Sweeteners,   and "Sugar-free" Products .  This will help patient to have stable blood glucose profile and potentially avoid unintended  Weight gain.  - Patient is advised to stick to a routine mealtimes to eat 3 meals  a day and avoid  unnecessary snacks ( to snack only to correct hypoglycemia).  - I have approached patient with the following individualized plan to manage diabetes and patient agrees.  -I will continue Lantus  32 units qhs, and Novolog 12 units TIDAC plus SSI. - He will continue to monitor BG ac and HS. I advised him to contact me for hypoglycemia or hyperglycemia above 200.  -Patient is not a candidate for metformin, SGLT2 inhibitors, and incretin in therapy due to history of liver transplant.   - Patient specific target  for A1c; LDL, HDL, Triglycerides, and  Waist Circumference were discussed in detail.  2) BP/HTN: Controlled. Continue current medications including ACEI/ARB. 3) Lipids/HPL:  Controlled, not on statins. 4)  Weight/Diet:  exercise, and carbohydrates information provided.  5) Primary hypothyroidism:  His thyroid function tests are consistent with appropriate replacement. I will continue levothyroxine 112 g by mouth every morning.  - We discussed about correct intake of levothyroxine, at fasting, with water, separated by at least 30 minutes from breakfast, and separated by more than 4 hours from calcium, iron, multivitamins, acid reflux medications (PPIs). -Patient is made aware of the fact that thyroid hormone replacement is needed for life, dose to be adjusted by periodic monitoring of thyroid function tests.  5) Chronic Care/Health Maintenance:  -Patient is on ACEI/ARB  medications and encouraged to continue to follow up with Ophthalmology, Podiatrist at least yearly or according to recommendations, and advised to  stay away from smoking. I have recommended yearly flu vaccine and pneumonia vaccination at least every 5 years; and  sleep for at least 7 hours a day.  - 25 minutes of time was spent on the care of this patient , 50% of which was applied for counseling on diabetes complications and theie preventions.  - I advised patient to maintain close follow up with Purvis Kilts,  MD for primary care needs.  Patient is asked to bring meter and  blood glucose logs during his next visit.   Follow up plan: -Return in about 3 months (around 09/27/2016) for follow up  with pre-visit labs, meter, and logs.  Glade Lloyd, MD Phone: [phone]  Fax: [phone]   06/29/2016, 9:18 AM

## 2016-06-29 NOTE — Patient Instructions (Signed)

## 2016-08-09 DIAGNOSIS — Z48298 Encounter for aftercare following other organ transplant: Secondary | ICD-10-CM | POA: Diagnosis not present

## 2016-08-09 DIAGNOSIS — Z79899 Other long term (current) drug therapy: Secondary | ICD-10-CM | POA: Diagnosis not present

## 2016-08-09 DIAGNOSIS — I10 Essential (primary) hypertension: Secondary | ICD-10-CM | POA: Diagnosis not present

## 2016-08-09 DIAGNOSIS — E119 Type 2 diabetes mellitus without complications: Secondary | ICD-10-CM | POA: Diagnosis not present

## 2016-08-09 DIAGNOSIS — Z944 Liver transplant status: Secondary | ICD-10-CM | POA: Diagnosis not present

## 2016-08-13 DIAGNOSIS — E039 Hypothyroidism, unspecified: Secondary | ICD-10-CM | POA: Diagnosis not present

## 2016-08-13 DIAGNOSIS — Z944 Liver transplant status: Secondary | ICD-10-CM | POA: Diagnosis not present

## 2016-08-13 DIAGNOSIS — Z794 Long term (current) use of insulin: Secondary | ICD-10-CM | POA: Diagnosis not present

## 2016-08-13 DIAGNOSIS — E1165 Type 2 diabetes mellitus with hyperglycemia: Secondary | ICD-10-CM | POA: Diagnosis not present

## 2016-08-13 DIAGNOSIS — E118 Type 2 diabetes mellitus with unspecified complications: Secondary | ICD-10-CM | POA: Diagnosis not present

## 2016-08-14 DIAGNOSIS — Z79899 Other long term (current) drug therapy: Secondary | ICD-10-CM | POA: Insufficient documentation

## 2016-08-14 DIAGNOSIS — Z7960 Long term (current) use of unspecified immunomodulators and immunosuppressants: Secondary | ICD-10-CM | POA: Insufficient documentation

## 2016-08-14 LAB — CMP14+EGFR
A/G RATIO: 1.3 (ref 1.2–2.2)
ALBUMIN: 3.9 g/dL (ref 3.5–4.8)
ALK PHOS: 75 IU/L (ref 39–117)
ALT: 15 IU/L (ref 0–44)
AST: 20 IU/L (ref 0–40)
BUN / CREAT RATIO: 18 (ref 10–24)
BUN: 24 mg/dL (ref 8–27)
Bilirubin Total: 0.8 mg/dL (ref 0.0–1.2)
CALCIUM: 9.2 mg/dL (ref 8.6–10.2)
CO2: 25 mmol/L (ref 18–29)
Chloride: 103 mmol/L (ref 96–106)
Creatinine, Ser: 1.36 mg/dL — ABNORMAL HIGH (ref 0.76–1.27)
GFR calc Af Amer: 58 mL/min/{1.73_m2} — ABNORMAL LOW (ref >59)
GFR, EST NON AFRICAN AMERICAN: 51 mL/min/{1.73_m2} — AB (ref >59)
GLOBULIN, TOTAL: 2.9 g/dL (ref 1.5–4.5)
Glucose: 141 mg/dL — ABNORMAL HIGH (ref 65–99)
POTASSIUM: 4.7 mmol/L (ref 3.5–5.2)
SODIUM: 144 mmol/L (ref 134–144)
Total Protein: 6.8 g/dL (ref 6.0–8.5)

## 2016-08-14 LAB — HEMOGLOBIN A1C
ESTIMATED AVERAGE GLUCOSE: 157 mg/dL
Hgb A1c MFr Bld: 7.1 % — ABNORMAL HIGH (ref 4.8–5.6)

## 2016-08-14 LAB — T4, FREE: FREE T4: 1.07 ng/dL (ref 0.82–1.77)

## 2016-08-14 LAB — TSH: TSH: 4.84 u[IU]/mL — ABNORMAL HIGH (ref 0.450–4.500)

## 2016-09-28 ENCOUNTER — Ambulatory Visit (INDEPENDENT_AMBULATORY_CARE_PROVIDER_SITE_OTHER): Payer: PPO | Admitting: "Endocrinology

## 2016-09-28 ENCOUNTER — Encounter: Payer: Self-pay | Admitting: "Endocrinology

## 2016-09-28 VITALS — BP 158/96 | HR 74 | Ht 69.5 in | Wt 266.0 lb

## 2016-09-28 DIAGNOSIS — E1165 Type 2 diabetes mellitus with hyperglycemia: Secondary | ICD-10-CM

## 2016-09-28 DIAGNOSIS — E782 Mixed hyperlipidemia: Secondary | ICD-10-CM

## 2016-09-28 DIAGNOSIS — E039 Hypothyroidism, unspecified: Secondary | ICD-10-CM

## 2016-09-28 DIAGNOSIS — I10 Essential (primary) hypertension: Secondary | ICD-10-CM

## 2016-09-28 DIAGNOSIS — E6609 Other obesity due to excess calories: Secondary | ICD-10-CM | POA: Diagnosis not present

## 2016-09-28 DIAGNOSIS — E118 Type 2 diabetes mellitus with unspecified complications: Secondary | ICD-10-CM

## 2016-09-28 DIAGNOSIS — Z794 Long term (current) use of insulin: Secondary | ICD-10-CM

## 2016-09-28 DIAGNOSIS — IMO0002 Reserved for concepts with insufficient information to code with codable children: Secondary | ICD-10-CM

## 2016-09-28 DIAGNOSIS — IMO0001 Reserved for inherently not codable concepts without codable children: Secondary | ICD-10-CM

## 2016-09-28 DIAGNOSIS — Z6837 Body mass index (BMI) 37.0-37.9, adult: Secondary | ICD-10-CM | POA: Diagnosis not present

## 2016-09-28 MED ORDER — INSULIN GLARGINE 100 UNIT/ML SOLOSTAR PEN: 32.0000 [IU] | PEN_INJECTOR | Freq: Every day | SUBCUTANEOUS | 1 refills | Status: DC

## 2016-09-28 MED ORDER — INSULIN ASPART 100 UNIT/ML FLEXPEN: 12.0000 [IU] | PEN_INJECTOR | Freq: Three times a day (TID) | SUBCUTANEOUS | 2 refills | Status: DC

## 2016-09-28 MED ORDER — GLUCOSE BLOOD VI STRP: ORAL_STRIP | 3 refills | Status: DC

## 2016-09-28 MED ORDER — LEVOTHYROXINE SODIUM 125 MCG PO TABS: 125.0000 ug | ORAL_TABLET | Freq: Every day | ORAL | 1 refills | Status: DC

## 2016-09-28 NOTE — Patient Instructions (Signed)

## 2016-09-28 NOTE — Progress Notes (Signed)
Subjective:    Patient ID: [name], male    DOB: 11/23/1940, PCP Purvis Kilts, MD   Past Medical History:  Diagnosis Date  . Anemia   . Biliary calculi, common bile duct   . CRI (chronic renal insufficiency)   . DM (diabetes mellitus) ([place])    Type II  . Elevated liver enzymes   . Esophageal varices ([place]) 03/19/11   mrcp  . History of ERCP 03/21/2011   Extrahepatic biliary obstruction secondary to occluded biliary stent and colon no cholelithiasis, status post sphincterotomy, stent removal and replacement and stone extraction.  Marland Kitchen HTN (hypertension)   . Hypothyroidism   . NASH (nonalcoholic steatohepatitis) 2006   liver transplant  . Pancreatitis chronic 03/19/11   mrcp  . S/P colonoscopy with polypectomy 2004   Dr Gala Romney  . S/P endoscopy 03/2004   Hx esophgaeal varices, pyloric channel ulcer prior to transplant  . Squamous cell cancer of external ear    pinna   Past Surgical History:  Procedure Laterality Date  . BILE DUCT STENT PLACEMENT  03/21/11   Dr. Gala Romney  . CHOLECYSTECTOMY  2006  . COLONOSCOPY  08/12/2002   Dr. Gala Romney- somewhat diffusely friable rectum and colon, polyp at 30 cm.  . COLONOSCOPY N/A 09/08/2012   Procedure: COLONOSCOPY;  Surgeon: Daneil Dolin, MD;  Location: [facility];  Service: Endoscopy;  Laterality: N/A;  10:30  . ERCP  03/21/2011   Dr. Gala Romney- ERCP with removal of pediatric feeding tube, endoscopic sphinecterotomy with balloon dilation of ampulla followed by stone extraction and stent placement  . ESOPHAGOGASTRODUODENOSCOPY  03/20/2004   Dr. Nash Mantis 2 esophageal varices o/w normal esophageal mucosa, mild changes of snake skin in the gastric mucosa consistent with portal gastropathy, multiple antral erosions  and  a single pyloric channel ulcer benign appearance o/w normal gastric mucosa, normal D1 and S2  . KNEE ARTHROSCOPY WITH EXCISION PLICA Left 7/58/8325   Procedure: KNEE ARTHROSCOPY WITH EXCISION PLICA;  Surgeon: Carole Civil, MD;  Location: [facility];  Service: Orthopedics;  Laterality: Left;  . KNEE ARTHROSCOPY WITH MEDIAL MENISECTOMY Left 11/21/2012   Procedure: KNEE ARTHROSCOPY WITH MEDIAL AND LATERAL MENISECTOMY;  Surgeon: Carole Civil, MD;  Location: [facility];  Service: Orthopedics;  Laterality: Left;  . LIVER TRANSPLANT  2006   UVA-NASH cirrhosis  . MOUTH SURGERY  may 2012  . MRCP  03/19/11   biliary ductal dilatation- stones. esophageal varices  . SPHINCTEROTOMY  03/21/2011   Procedure: SPHINCTEROTOMY;  Surgeon: Daneil Dolin, MD;  Location: [facility];  Service: Gastroenterology;;   Social History   Social History  . Marital status: Married    Spouse name: N/A  . Number of children: 1  . Years of education: 50   Occupational History  . RETIRED   .  Retired   Social History Main Topics  . Smoking status: Never Smoker  . Smokeless tobacco: Never Used  . Alcohol use No  . Drug use: No  . Sexual activity: Not Asked   Other Topics Concern  . None   Social History Narrative  . None   Outpatient Encounter Prescriptions as of 09/28/2016  Medication Sig  . aspirin 81 MG tablet Take 81 mg by mouth daily.    Marland Kitchen docusate sodium (COLACE) 100 MG capsule Take 100 mg by mouth at bedtime.  Marland Kitchen glucose blood (ONE TOUCH ULTRA TEST) test strip Use as instructed  . insulin aspart (NOVOLOG) 100 UNIT/ML FlexPen Inject  12-18 Units into the skin 3 (three) times daily with meals.  . Insulin Glargine (LANTUS SOLOSTAR) 100 UNIT/ML Solostar Pen Inject 32 Units into the skin daily at 10 pm.  . levothyroxine (SYNTHROID, LEVOTHROID) 125 MCG tablet Take 1 tablet (125 mcg total) by mouth daily.  Marland Kitchen lisinopril (PRINIVIL,ZESTRIL) 20 MG tablet Take 20 mg by mouth daily.   . Multiple Vitamins-Minerals (MULTIVITAMIN WITH MINERALS) tablet Take 1 tablet by mouth daily.    Marland Kitchen oxyCODONE-acetaminophen (PERCOCET/ROXICET) 5-325 MG per tablet Take 1 tablet by mouth every 4 (four) hours as needed for pain.  . predniSONE (DELTASONE)  10 MG tablet Take 10 mg by mouth as needed.  Marland Kitchen Specialty Vitamins Products (MAGNESIUM, AMINO ACID CHELATE,) 133 MG tablet Take 1 tablet by mouth 2 (two) times daily. Magnesium 185m   . tacrolimus (PROGRAF) 1 MG capsule Take 2-3 mg by mouth 2 (two) times daily. Takes 3 pills every morning and 2 pills at supper  . [DISCONTINUED] glucose blood (ONE TOUCH ULTRA TEST) test strip USE AS DIRECTED TO TEST BLOOD SUGAR 4 TIMES DAILY.  . [DISCONTINUED] glucose blood (ONE TOUCH ULTRA TEST) test strip Use as instructed  . [DISCONTINUED] insulin aspart (NOVOLOG) 100 UNIT/ML FlexPen Inject 12-18 Units into the skin 3 (three) times daily with meals.  . [DISCONTINUED] Insulin Glargine (LANTUS SOLOSTAR) 100 UNIT/ML Solostar Pen Inject 32 Units into the skin daily at 10 pm.  . [DISCONTINUED] levothyroxine (SYNTHROID, LEVOTHROID) 112 MCG tablet Take 1 tablet (112 mcg total) by mouth daily.   No facility-administered encounter medications on file as of 09/28/2016.    ALLERGIES: Allergies  Allergen Reactions  . Insulin Glargine Rash    Itching, reaction required epinephrine shot  . Contrast Media [Iodinated Diagnostic Agents] Rash   VACCINATION STATUS: Immunization History  Administered Date(s) Administered  . Influenza-Unspecified 04/08/2013  . Pneumococcal Polysaccharide-23 03/17/2010    Diabetes  He presents for his follow-up diabetic visit. He has type 2 diabetes mellitus. Onset time: He was diagnosed at approximate age of 89years. His disease course has been stable. There are no hypoglycemic associated symptoms. Pertinent negatives for hypoglycemia include no confusion, headaches, pallor or seizures. There are no diabetic associated symptoms. Pertinent negatives for diabetes include no chest pain, no fatigue, no polydipsia, no polyphagia, no polyuria and no weakness. There are no hypoglycemic complications. Symptoms are stable. There are no diabetic complications. Risk factors for coronary artery disease  include diabetes mellitus, dyslipidemia, hypertension, male sex, obesity, sedentary lifestyle and tobacco exposure. Current diabetic treatment includes intensive insulin program. He is compliant with treatment most of the time. His weight is stable. He is following a generally unhealthy diet. He rarely participates in exercise. His home blood glucose trend is decreasing steadily. His breakfast blood glucose range is generally 140-180 mg/dl. His lunch blood glucose range is generally 140-180 mg/dl. His dinner blood glucose range is generally 140-180 mg/dl. His overall blood glucose range is 140-180 mg/dl. An ACE inhibitor/angiotensin II receptor blocker is being taken.  Hyperlipidemia  This is a chronic problem. The current episode started more than 1 year ago. Pertinent negatives include no chest pain, myalgias or shortness of breath. He is currently on no antihyperlipidemic treatment.  Hypertension  This is a chronic problem. The current episode started more than 1 year ago. Pertinent negatives include no chest pain, headaches, neck pain, palpitations or shortness of breath. Past treatments include ACE inhibitors. He is status post liver transplant.. Identifiable causes of hypertension include a thyroid problem.  Thyroid Problem  Presents for follow-up visit. Patient reports no constipation, diarrhea, fatigue or palpitations. The symptoms have been improving. Past treatments include levothyroxine. The following procedures have not been performed: thyroidectomy. His past medical history is significant for hyperlipidemia.     Review of Systems  Constitutional: Negative for fatigue and unexpected weight change.  HENT: Negative for dental problem, mouth sores and trouble swallowing.   Eyes: Negative for visual disturbance.  Respiratory: Negative for cough, choking, chest tightness, shortness of breath and wheezing.   Cardiovascular: Negative for chest pain, palpitations and leg swelling.   Gastrointestinal: Negative for abdominal distention, abdominal pain, constipation, diarrhea, nausea and vomiting.  Endocrine: Negative for polydipsia, polyphagia and polyuria.  Genitourinary: Negative for dysuria, flank pain, hematuria and urgency.  Musculoskeletal: Negative for back pain, gait problem, myalgias and neck pain.  Skin: Negative for pallor, rash and wound.  Neurological: Negative for seizures, syncope, weakness, numbness and headaches.  Psychiatric/Behavioral: Negative.  Negative for confusion and dysphoric mood.    Objective:    BP (!) 158/96   Pulse 74   Ht 5' 9.5" (1.765 m)   Wt 266 lb (120.7 kg)   BMI 38.72 kg/m   Wt Readings from Last 3 Encounters:  09/28/16 266 lb (120.7 kg)  06/29/16 264 lb (119.7 kg)  03/26/16 257 lb (116.6 kg)    Physical Exam  Constitutional: He is oriented to person, place, and time. He appears well-developed and well-nourished. He is cooperative. No distress.  HENT:  Head: Normocephalic and atraumatic.  Eyes: EOM are normal.  Neck: Normal range of motion. Neck supple. No tracheal deviation present. No thyromegaly present.  Cardiovascular: Normal rate, S1 normal, S2 normal and normal heart sounds.  Exam reveals no gallop.   No murmur heard. Pulses:      Dorsalis pedis pulses are 1+ on the right side, and 1+ on the left side.       Posterior tibial pulses are 1+ on the right side, and 1+ on the left side.  Pulmonary/Chest: Breath sounds normal. No respiratory distress. He has no wheezes.  Abdominal: Soft. Bowel sounds are normal. He exhibits no distension. There is no tenderness. There is no guarding and no CVA tenderness.  Musculoskeletal: He exhibits no edema.       Right shoulder: He exhibits no swelling and no deformity.  Neurological: He is alert and oriented to person, place, and time. He has normal strength and normal reflexes. No cranial nerve deficit or sensory deficit. Gait normal.  Skin: Skin is warm and dry. No rash noted.  No cyanosis. Nails show no clubbing.  Psychiatric: He has a normal mood and affect. His speech is normal and behavior is normal. Judgment and thought content normal. Cognition and memory are normal.    Results for orders placed or performed in visit on 06/29/16  Hemoglobin A1c  Result Value Ref Range   Hgb A1c MFr Bld 7.1 (H) 4.8 - 5.6 %   Est. average glucose Bld gHb Est-mCnc 157 mg/dL  T4, free  Result Value Ref Range   Free T4 1.07 0.82 - 1.77 ng/dL  TSH  Result Value Ref Range   TSH 4.840 (H) 0.450 - 4.500 uIU/mL  CMP14+EGFR  Result Value Ref Range   Glucose 141 (H) 65 - 99 mg/dL   BUN 24 8 - 27 mg/dL   Creatinine, Ser 1.36 (H) 0.76 - 1.27 mg/dL   GFR calc non Af Amer 51 (L) >59 mL/min/1.73   GFR calc Af Amer 58 (L) >59  mL/min/1.73   BUN/Creatinine Ratio 18 10 - 24   Sodium 144 134 - 144 mmol/L   Potassium 4.7 3.5 - 5.2 mmol/L   Chloride 103 96 - 106 mmol/L   CO2 25 18 - 29 mmol/L   Calcium 9.2 8.6 - 10.2 mg/dL   Total Protein 6.8 6.0 - 8.5 g/dL   Albumin 3.9 3.5 - 4.8 g/dL   Globulin, Total 2.9 1.5 - 4.5 g/dL   Albumin/Globulin Ratio 1.3 1.2 - 2.2   Bilirubin Total 0.8 0.0 - 1.2 mg/dL   Alkaline Phosphatase 75 39 - 117 IU/L   AST 20 0 - 40 IU/L   ALT 15 0 - 44 IU/L   Diabetic Labs (most recent): Lab Results  Component Value Date   HGBA1C 7.1 (H) 08/13/2016   HGBA1C 6.9 (H) 05/14/2016   HGBA1C 7.3 (H) 02/13/2016   Results for [name], [name] (MRN [id number]) as of 09/28/2016 09:04  Ref. Range 08/13/2016 08:17  TSH Latest Ref Range: 0.450 - 4.500 uIU/mL 4.840 (H)  T4,Free(Direct) Latest Ref Range: 0.82 - 1.77 ng/dL 1.07     Assessment & Plan:   1. Uncontrolled type 2 diabetes mellitus with complication, with long-term current use of insulin ([place])  - patient remains at a high risk for more acute and chronic complications of diabetes which include CAD, CVA, CKD, retinopathy, and neuropathy. These are all discussed in detail with the patient.  Patient came with  A1c of   7.1% staying stable from 6.9%.  Marland Kitchen His most recent blood glucose readings are near target.  Glucose logs and insulin administration records pertaining to this visit,  to be scanned into patient's records.  Recent labs reviewed.   - I have re-counseled the patient on diet management   by adopting a carbohydrate restricted / protein rich  Diet.  - Suggestion is made for patient to avoid simple carbohydrates   from their diet including Cakes , Desserts, Ice Cream,  Soda (  diet and regular) , Sweet Tea , Candies,  Chips, Cookies, Artificial Sweeteners,   and "Sugar-free" Products .  This will help patient to have stable blood glucose profile and potentially avoid unintended  Weight gain.  - Patient is advised to stick to a routine mealtimes to eat 3 meals  a day and avoid unnecessary snacks ( to snack only to correct hypoglycemia).  - I have approached patient with the following individualized plan to manage diabetes and patient agrees.  -I will continue Lantus  32 units qhs, and Novolog 12 units TIDAC plus SSI. - He will continue to monitor BG ac and HS. I advised him to contact me for hypoglycemia or hyperglycemia above 200.  -Patient is not a candidate for metformin, SGLT2 inhibitors, and incretin in therapy due to history of liver transplant.   - Patient specific target  for A1c; LDL, HDL, Triglycerides, and  Waist Circumference were discussed in detail.  2) BP/HTN: Controlled. Continue current medications including ACEI/ARB. 3) Lipids/HPL:  Controlled, not on statins. 4)  Weight/Diet:  exercise, and carbohydrates information provided.  5) Primary hypothyroidism:  His thyroid function tests are consistent with under replacement. I will increase his levothyroxine to 125 g  by mouth every morning.  - We discussed about correct intake of levothyroxine, at fasting, with water, separated by at least 30 minutes from breakfast, and separated by more than 4 hours from calcium, iron,  multivitamins, acid reflux medications (PPIs). -Patient is made aware of the fact that thyroid hormone replacement  is needed for life, dose to be adjusted by periodic monitoring of thyroid function tests.  5) Chronic Care/Health Maintenance:  -Patient is on ACEI/ARB  medications and encouraged to continue to follow up with Ophthalmology, Podiatrist at least yearly or according to recommendations, and advised to  stay away from smoking. I have recommended yearly flu vaccine and pneumonia vaccination at least every 5 years; and  sleep for at least 7 hours a day.  - 25 minutes of time was spent on the care of this patient , 50% of which was applied for counseling on diabetes complications and theie preventions.  - I advised patient to maintain close follow up with Purvis Kilts, MD for primary care needs.  Patient is asked to bring meter and  blood glucose logs during his next visit.   Follow up plan: -Return in about 4 months (around 01/28/2017) for follow up with pre-visit labs, meter, and logs.  Glade Lloyd, MD Phone: [phone]  Fax: [phone]   09/28/2016, 9:15 AM

## 2016-11-05 DIAGNOSIS — E039 Hypothyroidism, unspecified: Secondary | ICD-10-CM | POA: Diagnosis not present

## 2016-11-05 DIAGNOSIS — E1165 Type 2 diabetes mellitus with hyperglycemia: Secondary | ICD-10-CM | POA: Diagnosis not present

## 2016-11-05 DIAGNOSIS — Z944 Liver transplant status: Secondary | ICD-10-CM | POA: Diagnosis not present

## 2016-11-05 DIAGNOSIS — Z1389 Encounter for screening for other disorder: Secondary | ICD-10-CM | POA: Diagnosis not present

## 2016-11-05 DIAGNOSIS — Z6841 Body Mass Index (BMI) 40.0 and over, adult: Secondary | ICD-10-CM | POA: Diagnosis not present

## 2016-11-05 DIAGNOSIS — E782 Mixed hyperlipidemia: Secondary | ICD-10-CM | POA: Diagnosis not present

## 2016-11-05 DIAGNOSIS — E1129 Type 2 diabetes mellitus with other diabetic kidney complication: Secondary | ICD-10-CM | POA: Diagnosis not present

## 2016-11-12 ENCOUNTER — Other Ambulatory Visit: Payer: Self-pay | Admitting: "Endocrinology

## 2016-11-12 DIAGNOSIS — E782 Mixed hyperlipidemia: Secondary | ICD-10-CM | POA: Diagnosis not present

## 2016-11-12 DIAGNOSIS — E1165 Type 2 diabetes mellitus with hyperglycemia: Secondary | ICD-10-CM | POA: Diagnosis not present

## 2016-11-12 DIAGNOSIS — Z944 Liver transplant status: Secondary | ICD-10-CM | POA: Diagnosis not present

## 2016-11-12 DIAGNOSIS — Z1389 Encounter for screening for other disorder: Secondary | ICD-10-CM | POA: Diagnosis not present

## 2016-11-12 DIAGNOSIS — E039 Hypothyroidism, unspecified: Secondary | ICD-10-CM | POA: Diagnosis not present

## 2016-11-12 DIAGNOSIS — Z794 Long term (current) use of insulin: Secondary | ICD-10-CM | POA: Diagnosis not present

## 2016-11-12 DIAGNOSIS — E118 Type 2 diabetes mellitus with unspecified complications: Secondary | ICD-10-CM | POA: Diagnosis not present

## 2016-11-13 LAB — CMP14+EGFR
A/G RATIO: 1.2 (ref 1.2–2.2)
ALBUMIN: 4 g/dL (ref 3.5–4.8)
ALT: 15 IU/L (ref 0–44)
AST: 15 IU/L (ref 0–40)
Alkaline Phosphatase: 76 IU/L (ref 39–117)
BUN / CREAT RATIO: 14 (ref 10–24)
BUN: 19 mg/dL (ref 8–27)
Bilirubin Total: 0.6 mg/dL (ref 0.0–1.2)
CALCIUM: 9.2 mg/dL (ref 8.6–10.2)
CO2: 25 mmol/L (ref 18–29)
Chloride: 104 mmol/L (ref 96–106)
Creatinine, Ser: 1.36 mg/dL — ABNORMAL HIGH (ref 0.76–1.27)
GFR, EST AFRICAN AMERICAN: 58 mL/min/{1.73_m2} — AB (ref >59)
GFR, EST NON AFRICAN AMERICAN: 51 mL/min/{1.73_m2} — AB (ref >59)
Globulin, Total: 3.4 g/dL (ref 1.5–4.5)
Glucose: 149 mg/dL — ABNORMAL HIGH (ref 65–99)
Potassium: 4.7 mmol/L (ref 3.5–5.2)
Sodium: 139 mmol/L (ref 134–144)
TOTAL PROTEIN: 7.4 g/dL (ref 6.0–8.5)

## 2016-11-13 LAB — TSH: TSH: 5.54 u[IU]/mL — ABNORMAL HIGH (ref 0.450–4.500)

## 2016-11-13 LAB — T4, FREE: Free T4: 0.98 ng/dL (ref 0.82–1.77)

## 2016-11-16 LAB — HGB A1C W/O EAG: Hgb A1c MFr Bld: 7.4 % — ABNORMAL HIGH (ref 4.8–5.6)

## 2017-01-11 ENCOUNTER — Other Ambulatory Visit: Payer: Self-pay | Admitting: "Endocrinology

## 2017-01-28 ENCOUNTER — Encounter: Payer: Self-pay | Admitting: "Endocrinology

## 2017-01-28 ENCOUNTER — Ambulatory Visit (INDEPENDENT_AMBULATORY_CARE_PROVIDER_SITE_OTHER): Payer: PPO | Admitting: "Endocrinology

## 2017-01-28 VITALS — BP 147/84 | HR 77 | Ht 69.5 in | Wt 261.0 lb

## 2017-01-28 DIAGNOSIS — E6609 Other obesity due to excess calories: Secondary | ICD-10-CM

## 2017-01-28 DIAGNOSIS — Z794 Long term (current) use of insulin: Secondary | ICD-10-CM | POA: Diagnosis not present

## 2017-01-28 DIAGNOSIS — E1165 Type 2 diabetes mellitus with hyperglycemia: Secondary | ICD-10-CM

## 2017-01-28 DIAGNOSIS — IMO0002 Reserved for concepts with insufficient information to code with codable children: Secondary | ICD-10-CM

## 2017-01-28 DIAGNOSIS — Z6837 Body mass index (BMI) 37.0-37.9, adult: Secondary | ICD-10-CM | POA: Diagnosis not present

## 2017-01-28 DIAGNOSIS — E782 Mixed hyperlipidemia: Secondary | ICD-10-CM | POA: Diagnosis not present

## 2017-01-28 DIAGNOSIS — IMO0001 Reserved for inherently not codable concepts without codable children: Secondary | ICD-10-CM

## 2017-01-28 DIAGNOSIS — E118 Type 2 diabetes mellitus with unspecified complications: Secondary | ICD-10-CM

## 2017-01-28 DIAGNOSIS — I10 Essential (primary) hypertension: Secondary | ICD-10-CM

## 2017-01-28 DIAGNOSIS — E039 Hypothyroidism, unspecified: Secondary | ICD-10-CM | POA: Diagnosis not present

## 2017-01-28 MED ORDER — LEVOTHYROXINE SODIUM 137 MCG PO TABS: 137.0000 ug | ORAL_TABLET | Freq: Every day | ORAL | 6 refills | Status: DC

## 2017-01-28 NOTE — Progress Notes (Signed)
Subjective:    Patient ID: [name], male    DOB: 06/25/1941, PCP Sharilyn Sites, MD   Past Medical History:  Diagnosis Date  . Anemia   . Biliary calculi, common bile duct   . CRI (chronic renal insufficiency)   . DM (diabetes mellitus) ([place])    Type II  . Elevated liver enzymes   . Esophageal varices ([place]) 03/19/11   mrcp  . History of ERCP 03/21/2011   Extrahepatic biliary obstruction secondary to occluded biliary stent and colon no cholelithiasis, status post sphincterotomy, stent removal and replacement and stone extraction.  Marland Kitchen HTN (hypertension)   . Hypothyroidism   . NASH (nonalcoholic steatohepatitis) 2006   liver transplant  . Pancreatitis chronic 03/19/11   mrcp  . S/P colonoscopy with polypectomy 2004   Dr Gala Romney  . S/P endoscopy 03/2004   Hx esophgaeal varices, pyloric channel ulcer prior to transplant  . Squamous cell cancer of external ear    pinna   Past Surgical History:  Procedure Laterality Date  . BILE DUCT STENT PLACEMENT  03/21/11   Dr. Gala Romney  . CHOLECYSTECTOMY  2006  . COLONOSCOPY  08/12/2002   Dr. Gala Romney- somewhat diffusely friable rectum and colon, polyp at 30 cm.  . COLONOSCOPY N/A 09/08/2012   Procedure: COLONOSCOPY;  Surgeon: Daneil Dolin, MD;  Location: [facility];  Service: Endoscopy;  Laterality: N/A;  10:30  . ERCP  03/21/2011   Dr. Gala Romney- ERCP with removal of pediatric feeding tube, endoscopic sphinecterotomy with balloon dilation of ampulla followed by stone extraction and stent placement  . ESOPHAGOGASTRODUODENOSCOPY  03/20/2004   Dr. Nash Mantis 2 esophageal varices o/w normal esophageal mucosa, mild changes of snake skin in the gastric mucosa consistent with portal gastropathy, multiple antral erosions  and  a single pyloric channel ulcer benign appearance o/w normal gastric mucosa, normal D1 and S2  . KNEE ARTHROSCOPY WITH EXCISION PLICA Left 6/76/1950   Procedure: KNEE ARTHROSCOPY WITH EXCISION PLICA;  Surgeon: Carole Civil,  MD;  Location: [facility];  Service: Orthopedics;  Laterality: Left;  . KNEE ARTHROSCOPY WITH MEDIAL MENISECTOMY Left 11/21/2012   Procedure: KNEE ARTHROSCOPY WITH MEDIAL AND LATERAL MENISECTOMY;  Surgeon: Carole Civil, MD;  Location: [facility];  Service: Orthopedics;  Laterality: Left;  . LIVER TRANSPLANT  2006   UVA-NASH cirrhosis  . MOUTH SURGERY  may 2012  . MRCP  03/19/11   biliary ductal dilatation- stones. esophageal varices  . SPHINCTEROTOMY  03/21/2011   Procedure: SPHINCTEROTOMY;  Surgeon: Daneil Dolin, MD;  Location: [facility];  Service: Gastroenterology;;   Social History   Social History  . Marital status: Married    Spouse name: N/A  . Number of children: 1  . Years of education: 71   Occupational History  . RETIRED   .  Retired   Social History Main Topics  . Smoking status: Never Smoker  . Smokeless tobacco: Never Used  . Alcohol use No  . Drug use: No  . Sexual activity: Not Asked   Other Topics Concern  . None   Social History Narrative  . None   Outpatient Encounter Prescriptions as of 01/28/2017  Medication Sig  . aspirin 81 MG tablet Take 81 mg by mouth daily.    Marland Kitchen docusate sodium (COLACE) 100 MG capsule Take 100 mg by mouth at bedtime.  . insulin aspart (NOVOLOG) 100 UNIT/ML FlexPen Inject 12-18 Units into the skin 3 (three) times daily with meals.  . Insulin  Glargine (LANTUS SOLOSTAR) 100 UNIT/ML Solostar Pen Inject 32 Units into the skin daily at 10 pm.  . levothyroxine (SYNTHROID, LEVOTHROID) 137 MCG tablet Take 1 tablet (137 mcg total) by mouth daily before breakfast.  . lisinopril (PRINIVIL,ZESTRIL) 20 MG tablet Take 20 mg by mouth daily.   . Multiple Vitamins-Minerals (MULTIVITAMIN WITH MINERALS) tablet Take 1 tablet by mouth daily.    . ONE TOUCH ULTRA TEST test strip USE AS DIRECTED TO TEST BLOOD SUGAR 4 TIMES DAILY.  Marland Kitchen oxyCODONE-acetaminophen (PERCOCET/ROXICET) 5-325 MG per tablet Take 1 tablet by mouth every 4 (four) hours as needed for pain.   . predniSONE (DELTASONE) 10 MG tablet Take 10 mg by mouth as needed.  Marland Kitchen Specialty Vitamins Products (MAGNESIUM, AMINO ACID CHELATE,) 133 MG tablet Take 1 tablet by mouth 2 (two) times daily. Magnesium 133mg    . tacrolimus (PROGRAF) 1 MG capsule Take 2-3 mg by mouth 2 (two) times daily. Takes 3 pills every morning and 2 pills at supper  . [DISCONTINUED] levothyroxine (SYNTHROID, LEVOTHROID) 125 MCG tablet Take 1 tablet (125 mcg total) by mouth daily.   No facility-administered encounter medications on file as of 01/28/2017.    ALLERGIES: Allergies  Allergen Reactions  . Insulin Glargine Rash    Itching, reaction required epinephrine shot  . Contrast Media [Iodinated Diagnostic Agents] Rash   VACCINATION STATUS: Immunization History  Administered Date(s) Administered  . Influenza-Unspecified 04/08/2013  . Pneumococcal Polysaccharide-23 03/17/2010    Diabetes  He presents for his follow-up diabetic visit. He has type 2 diabetes mellitus. Onset time: He was diagnosed at approximate age of 38 years. His disease course has been stable. There are no hypoglycemic associated symptoms. Pertinent negatives for hypoglycemia include no confusion, headaches, pallor or seizures. There are no diabetic associated symptoms. Pertinent negatives for diabetes include no chest pain, no fatigue, no polydipsia, no polyphagia, no polyuria and no weakness. There are no hypoglycemic complications. Symptoms are stable. There are no diabetic complications. Risk factors for coronary artery disease include diabetes mellitus, dyslipidemia, hypertension, male sex, obesity, sedentary lifestyle and tobacco exposure. Current diabetic treatment includes intensive insulin program. He is compliant with treatment most of the time. His weight is stable. He is following a generally unhealthy diet. He rarely participates in exercise. His home blood glucose trend is decreasing steadily. His breakfast blood glucose range is generally  140-180 mg/dl. His lunch blood glucose range is generally 140-180 mg/dl. His dinner blood glucose range is generally 140-180 mg/dl. His overall blood glucose range is 140-180 mg/dl. An ACE inhibitor/angiotensin II receptor blocker is being taken.  Hyperlipidemia  This is a chronic problem. The current episode started more than 1 year ago. Pertinent negatives include no chest pain, myalgias or shortness of breath. He is currently on no antihyperlipidemic treatment.  Hypertension  This is a chronic problem. The current episode started more than 1 year ago. Pertinent negatives include no chest pain, headaches, neck pain, palpitations or shortness of breath. Past treatments include ACE inhibitors. He is status post liver transplant.. Identifiable causes of hypertension include a thyroid problem.  Thyroid Problem  Presents for follow-up visit. Patient reports no constipation, diarrhea, fatigue or palpitations. The symptoms have been improving. Past treatments include levothyroxine. The following procedures have not been performed: thyroidectomy. His past medical history is significant for hyperlipidemia.     Review of Systems  Constitutional: Negative for fatigue and unexpected weight change.  HENT: Negative for dental problem, mouth sores and trouble swallowing.   Eyes: Negative for visual  disturbance.  Respiratory: Negative for cough, choking, chest tightness, shortness of breath and wheezing.   Cardiovascular: Negative for chest pain, palpitations and leg swelling.  Gastrointestinal: Negative for abdominal distention, abdominal pain, constipation, diarrhea, nausea and vomiting.  Endocrine: Negative for polydipsia, polyphagia and polyuria.  Genitourinary: Negative for dysuria, flank pain, hematuria and urgency.  Musculoskeletal: Negative for back pain, gait problem, myalgias and neck pain.  Skin: Negative for pallor, rash and wound.  Neurological: Negative for seizures, syncope, weakness, numbness  and headaches.  Psychiatric/Behavioral: Negative.  Negative for confusion and dysphoric mood.    Objective:    BP (!) 147/84   Pulse 77   Ht 5' 9.5" (1.765 m)   Wt 261 lb (118.4 kg)   BMI 37.99 kg/m   Wt Readings from Last 3 Encounters:  01/28/17 261 lb (118.4 kg)  09/28/16 266 lb (120.7 kg)  06/29/16 264 lb (119.7 kg)    Physical Exam  Constitutional: He is oriented to person, place, and time. He appears well-developed and well-nourished. He is cooperative. No distress.  HENT:  Head: Normocephalic and atraumatic.  Eyes: EOM are normal.  Neck: Normal range of motion. Neck supple. No tracheal deviation present. No thyromegaly present.  Cardiovascular: Normal rate, S1 normal, S2 normal and normal heart sounds.  Exam reveals no gallop.   No murmur heard. Pulses:      Dorsalis pedis pulses are 1+ on the right side, and 1+ on the left side.       Posterior tibial pulses are 1+ on the right side, and 1+ on the left side.  Pulmonary/Chest: Breath sounds normal. No respiratory distress. He has no wheezes.  Abdominal: Soft. Bowel sounds are normal. He exhibits no distension. There is no tenderness. There is no guarding and no CVA tenderness.  Musculoskeletal: He exhibits no edema.       Right shoulder: He exhibits no swelling and no deformity.  Neurological: He is alert and oriented to person, place, and time. He has normal strength and normal reflexes. No cranial nerve deficit or sensory deficit. Gait normal.  Skin: Skin is warm and dry. No rash noted. No cyanosis. Nails show no clubbing.  Psychiatric: He has a normal mood and affect. His speech is normal and behavior is normal. Judgment and thought content normal. Cognition and memory are normal.    Results for orders placed or performed in visit on 11/12/16  Hgb A1c w/o eAG  Result Value Ref Range   Hgb A1c MFr Bld 7.4 (H) 4.8 - 5.6 %   Diabetic Labs (most recent): Lab Results  Component Value Date   HGBA1C 7.4 (H) 11/12/2016    HGBA1C 7.1 (H) 08/13/2016   HGBA1C 6.9 (H) 05/14/2016   Results for TIBOR, LEMMONS (MRN [id number]) as of 01/28/2017 11:01  Ref. Range 08/13/2016 08:17 11/12/2016 08:23  TSH Latest Ref Range: 0.450 - 4.500 uIU/mL 4.840 (H) 5.540 (H)  T4,Free(Direct) Latest Ref Range: 0.82 - 1.77 ng/dL 1.07 0.98    Assessment & Plan:   1. Uncontrolled type 2 diabetes mellitus with complication, with long-term current use of insulin ([place])  - patient remains at a high risk for more acute and chronic complications of diabetes which include CAD, CVA, CKD, retinopathy, and neuropathy. These are all discussed in detail with the patient.  Patient came with A1c of   7.4% staying stable from 7.1%.  - His most recent blood glucose readings are near target.  Glucose logs and insulin administration records pertaining to this visit,  to be scanned into patient's records.  Recent labs reviewed.   - I have re-counseled the patient on diet management   by adopting a carbohydrate restricted / protein rich  Diet.  - Suggestion is made for patient to avoid simple carbohydrates   from his diet including Cakes , Desserts, Ice Cream,  Soda (  diet and regular) , Sweet Tea , Candies,  Chips, Cookies, Artificial Sweeteners,   and "Sugar-free" Products .  This will help patient to have stable blood glucose profile and potentially avoid unintended  Weight gain.  - Patient is advised to stick to a routine mealtimes to eat 3 meals  a day and avoid unnecessary snacks ( to snack only to correct hypoglycemia).  - I have approached patient with the following individualized plan to manage diabetes and patient agrees.  -I will continue Lantus  32 units qhs, and Novolog 12 units TIDAC plus SSI. - He will continue to monitor BG ac and HS. I advised him to contact me for hypoglycemia or hyperglycemia above 200.  -Patient is not a candidate for metformin, SGLT2 inhibitors, and incretin in therapy due to history of liver transplant.   -  Patient specific target  for A1c; LDL, HDL, Triglycerides, and  Waist Circumference were discussed in detail.  2) BP/HTN: Controlled. Continue current medications including ACEI/ARB. 3) Lipids/HPL:  Controlled, not on statins. 4)  Weight/Diet:  exercise, and carbohydrates information provided.  5) Primary hypothyroidism:  His thyroid function tests are consistent with under replacement. I will increase his levothyroxine to 137 g  by mouth every morning.  - We discussed about correct intake of levothyroxine, at fasting, with water, separated by at least 30 minutes from breakfast, and separated by more than 4 hours from calcium, iron, multivitamins, acid reflux medications (PPIs). -Patient is made aware of the fact that thyroid hormone replacement is needed for life, dose to be adjusted by periodic monitoring of thyroid function tests.  5) Chronic Care/Health Maintenance:  -Patient is on ACEI/ARB  medications and encouraged to continue to follow up with Ophthalmology, Podiatrist at least yearly or according to recommendations, and advised to  stay away from smoking. I have recommended yearly flu vaccine and pneumonia vaccination at least every 5 years; and  sleep for at least 7 hours a day.  - 25 minutes of time was spent on the care of this patient , 50% of which was applied for counseling on diabetes complications and theie preventions.  - I advised patient to maintain close follow up with Sharilyn Sites, MD for primary care needs.  Patient is asked to bring meter and  blood glucose logs during his next visit.   Follow up plan: -Return in about 4 months (around 05/31/2017) for meter, and logs.  Glade Lloyd, MD Phone: [phone]  Fax: [phone]   01/28/2017, 11:01 AM

## 2017-02-11 ENCOUNTER — Telehealth: Payer: Self-pay | Admitting: "Endocrinology

## 2017-02-11 DIAGNOSIS — Z944 Liver transplant status: Secondary | ICD-10-CM | POA: Diagnosis not present

## 2017-02-11 NOTE — Telephone Encounter (Signed)
[name] is asking for [place] put in his labs orders and mail them to him before the week of Nov 19th, please advise?

## 2017-02-22 ENCOUNTER — Other Ambulatory Visit: Payer: Self-pay | Admitting: "Endocrinology

## 2017-02-22 DIAGNOSIS — E1165 Type 2 diabetes mellitus with hyperglycemia: Secondary | ICD-10-CM

## 2017-02-22 DIAGNOSIS — E118 Type 2 diabetes mellitus with unspecified complications: Principal | ICD-10-CM

## 2017-02-22 DIAGNOSIS — E039 Hypothyroidism, unspecified: Secondary | ICD-10-CM

## 2017-02-22 NOTE — Progress Notes (Signed)
a1c

## 2017-02-22 NOTE — Telephone Encounter (Signed)
sent 

## 2017-04-12 ENCOUNTER — Emergency Department (HOSPITAL_COMMUNITY): Payer: PPO

## 2017-04-12 ENCOUNTER — Observation Stay (HOSPITAL_COMMUNITY): Payer: PPO

## 2017-04-12 ENCOUNTER — Encounter (HOSPITAL_COMMUNITY): Payer: Self-pay

## 2017-04-12 ENCOUNTER — Inpatient Hospital Stay (HOSPITAL_COMMUNITY): Payer: PPO

## 2017-04-12 ENCOUNTER — Inpatient Hospital Stay (HOSPITAL_COMMUNITY)
Admission: EM | Admit: 2017-04-12 | Discharge: 2017-04-16 | DRG: 065 | Disposition: A | Discharge status: Skilled Nursing Facility | Payer: PPO | Attending: Family Medicine | Admitting: Family Medicine

## 2017-04-12 DIAGNOSIS — I129 Hypertensive chronic kidney disease with stage 1 through stage 4 chronic kidney disease, or unspecified chronic kidney disease: Secondary | ICD-10-CM | POA: Diagnosis not present

## 2017-04-12 DIAGNOSIS — Z888 Allergy status to other drugs, medicaments and biological substances status: Secondary | ICD-10-CM | POA: Diagnosis not present

## 2017-04-12 DIAGNOSIS — Z7952 Long term (current) use of systemic steroids: Secondary | ICD-10-CM | POA: Diagnosis not present

## 2017-04-12 DIAGNOSIS — Z23 Encounter for immunization: Secondary | ICD-10-CM

## 2017-04-12 DIAGNOSIS — R270 Ataxia, unspecified: Secondary | ICD-10-CM | POA: Diagnosis not present

## 2017-04-12 DIAGNOSIS — E039 Hypothyroidism, unspecified: Secondary | ICD-10-CM | POA: Diagnosis present

## 2017-04-12 DIAGNOSIS — G8194 Hemiplegia, unspecified affecting left nondominant side: Secondary | ICD-10-CM | POA: Diagnosis not present

## 2017-04-12 DIAGNOSIS — G4733 Obstructive sleep apnea (adult) (pediatric): Secondary | ICD-10-CM | POA: Diagnosis not present

## 2017-04-12 DIAGNOSIS — R471 Dysarthria and anarthria: Secondary | ICD-10-CM | POA: Diagnosis present

## 2017-04-12 DIAGNOSIS — M1000 Idiopathic gout, unspecified site: Secondary | ICD-10-CM | POA: Diagnosis not present

## 2017-04-12 DIAGNOSIS — I672 Cerebral atherosclerosis: Secondary | ICD-10-CM | POA: Diagnosis present

## 2017-04-12 DIAGNOSIS — Z794 Long term (current) use of insulin: Secondary | ICD-10-CM | POA: Diagnosis not present

## 2017-04-12 DIAGNOSIS — E118 Type 2 diabetes mellitus with unspecified complications: Secondary | ICD-10-CM | POA: Diagnosis not present

## 2017-04-12 DIAGNOSIS — I6621 Occlusion and stenosis of right posterior cerebral artery: Secondary | ICD-10-CM | POA: Diagnosis present

## 2017-04-12 DIAGNOSIS — R278 Other lack of coordination: Secondary | ICD-10-CM | POA: Diagnosis not present

## 2017-04-12 DIAGNOSIS — I6381 Other cerebral infarction due to occlusion or stenosis of small artery: Secondary | ICD-10-CM | POA: Diagnosis not present

## 2017-04-12 DIAGNOSIS — N182 Chronic kidney disease, stage 2 (mild): Secondary | ICD-10-CM | POA: Diagnosis not present

## 2017-04-12 DIAGNOSIS — K8681 Exocrine pancreatic insufficiency: Secondary | ICD-10-CM | POA: Diagnosis not present

## 2017-04-12 DIAGNOSIS — K861 Other chronic pancreatitis: Secondary | ICD-10-CM | POA: Diagnosis not present

## 2017-04-12 DIAGNOSIS — R269 Unspecified abnormalities of gait and mobility: Secondary | ICD-10-CM | POA: Diagnosis not present

## 2017-04-12 DIAGNOSIS — M6281 Muscle weakness (generalized): Secondary | ICD-10-CM | POA: Diagnosis not present

## 2017-04-12 DIAGNOSIS — E1122 Type 2 diabetes mellitus with diabetic chronic kidney disease: Secondary | ICD-10-CM | POA: Diagnosis not present

## 2017-04-12 DIAGNOSIS — Z944 Liver transplant status: Secondary | ICD-10-CM | POA: Diagnosis not present

## 2017-04-12 DIAGNOSIS — I5030 Unspecified diastolic (congestive) heart failure: Secondary | ICD-10-CM | POA: Diagnosis not present

## 2017-04-12 DIAGNOSIS — E1165 Type 2 diabetes mellitus with hyperglycemia: Secondary | ICD-10-CM | POA: Diagnosis not present

## 2017-04-12 DIAGNOSIS — R29703 NIHSS score 3: Secondary | ICD-10-CM | POA: Diagnosis not present

## 2017-04-12 DIAGNOSIS — N183 Chronic kidney disease, stage 3 unspecified: Secondary | ICD-10-CM

## 2017-04-12 DIAGNOSIS — I69152 Hemiplegia and hemiparesis following nontraumatic intracerebral hemorrhage affecting left dominant side: Secondary | ICD-10-CM | POA: Diagnosis not present

## 2017-04-12 DIAGNOSIS — I8500 Esophageal varices without bleeding: Secondary | ICD-10-CM | POA: Diagnosis not present

## 2017-04-12 DIAGNOSIS — R29898 Other symptoms and signs involving the musculoskeletal system: Secondary | ICD-10-CM | POA: Diagnosis present

## 2017-04-12 DIAGNOSIS — R531 Weakness: Secondary | ICD-10-CM

## 2017-04-12 DIAGNOSIS — G9349 Other encephalopathy: Secondary | ICD-10-CM | POA: Diagnosis not present

## 2017-04-12 DIAGNOSIS — M23322 Other meniscus derangements, posterior horn of medial meniscus, left knee: Secondary | ICD-10-CM

## 2017-04-12 DIAGNOSIS — Z79899 Other long term (current) drug therapy: Secondary | ICD-10-CM

## 2017-04-12 DIAGNOSIS — I10 Essential (primary) hypertension: Secondary | ICD-10-CM | POA: Diagnosis not present

## 2017-04-12 DIAGNOSIS — Z7982 Long term (current) use of aspirin: Secondary | ICD-10-CM

## 2017-04-12 DIAGNOSIS — I639 Cerebral infarction, unspecified: Secondary | ICD-10-CM | POA: Diagnosis not present

## 2017-04-12 DIAGNOSIS — Z9049 Acquired absence of other specified parts of digestive tract: Secondary | ICD-10-CM

## 2017-04-12 DIAGNOSIS — Z91041 Radiographic dye allergy status: Secondary | ICD-10-CM

## 2017-04-12 DIAGNOSIS — I6523 Occlusion and stenosis of bilateral carotid arteries: Secondary | ICD-10-CM | POA: Diagnosis not present

## 2017-04-12 DIAGNOSIS — Z9689 Presence of other specified functional implants: Secondary | ICD-10-CM | POA: Diagnosis not present

## 2017-04-12 DIAGNOSIS — R29818 Other symptoms and signs involving the nervous system: Secondary | ICD-10-CM | POA: Diagnosis not present

## 2017-04-12 DIAGNOSIS — R2689 Other abnormalities of gait and mobility: Secondary | ICD-10-CM | POA: Diagnosis not present

## 2017-04-12 LAB — ECHOCARDIOGRAM COMPLETE
AO mean calculated velocity dopler: 108 cm/s
AV Area VTI: 1.68 cm2
AV Area mean vel: 1.95 cm2
AV Mean grad: 6 mmHg
AV Peak grad: 13 mmHg
AV area mean vel ind: 0.79 cm2/m2
AV peak Index: 0.68
AVA: 1.66 cm2
AVAREAVTIIND: 0.68 cm2/m2
AVCELMEANRAT: 0.69
AVLVOTPG: 4 mmHg
AVPKVEL: 177 cm/s
Ao pk vel: 0.59 m/s
CHL CUP AV VEL: 1.66
CHL CUP DOP CALC LVOT VTI: 22.8 cm
E decel time: 197 msec
EERAT: 9.6
FS: 32 % (ref 28–44)
Height: 69.5 in
IVS/LV PW RATIO, ED: 1.36
LA diam index: 1.43 cm/m2
LA vol index: 22.1 mL/m2
LA vol: 54.1 mL
LASIZE: 35 mm
LAVOLA4C: 55.2 mL
LEFT ATRIUM END SYS DIAM: 35 mm
LV E/e' medial: 9.6
LV TDI E'MEDIAL: 7.07
LV dias vol index: 24 mL/m2
LV dias vol: 58 mL — AB (ref 62–150)
LV e' LATERAL: 8.49 cm/s
LVEEAVG: 9.6
LVOT area: 2.84 cm2
LVOT peak VTI: 0.58 cm
LVOTD: 19 mm
LVOTPV: 105 cm/s
LVOTSV: 65 mL
LVSYSVOL: 22 mL
LVSYSVOLIN: 9 mL/m2
MV Dec: 197
MV pk A vel: 107 m/s
MV pk E vel: 81.5 m/s
MVPG: 3 mmHg
PW: 9.04 mm — AB (ref 0.6–1.1)
Simpson's disk: 62
Stroke v: 36 ml
TDI e' lateral: 8.49
VTI: 39 cm
Valve area index: 0.68
Weight: 4160 oz

## 2017-04-12 LAB — URINALYSIS, ROUTINE W REFLEX MICROSCOPIC
Bacteria, UA: NONE SEEN
Bilirubin Urine: NEGATIVE
GLUCOSE, UA: NEGATIVE mg/dL
Ketones, ur: NEGATIVE mg/dL
Leukocytes, UA: NEGATIVE
NITRITE: NEGATIVE
PH: 6 (ref 5.0–8.0)
Protein, ur: NEGATIVE mg/dL
SPECIFIC GRAVITY, URINE: 1.016 (ref 1.005–1.030)
Squamous Epithelial / LPF: NONE SEEN

## 2017-04-12 LAB — I-STAT CHEM 8, ED
BUN: 30 mg/dL — ABNORMAL HIGH (ref 6–20)
CHLORIDE: 105 mmol/L (ref 101–111)
CREATININE: 1.4 mg/dL — AB (ref 0.61–1.24)
Calcium, Ion: 1.25 mmol/L (ref 1.15–1.40)
GLUCOSE: 141 mg/dL — AB (ref 65–99)
HCT: 43 % (ref 39.0–52.0)
Hemoglobin: 14.6 g/dL (ref 13.0–17.0)
POTASSIUM: 4.2 mmol/L (ref 3.5–5.1)
Sodium: 143 mmol/L (ref 135–145)
TCO2: 26 mmol/L (ref 22–32)

## 2017-04-12 LAB — GLUCOSE, CAPILLARY
GLUCOSE-CAPILLARY: 109 mg/dL — AB (ref 65–99)
Glucose-Capillary: 149 mg/dL — ABNORMAL HIGH (ref 65–99)
Glucose-Capillary: 158 mg/dL — ABNORMAL HIGH (ref 65–99)
Glucose-Capillary: 119 mg/dL — ABNORMAL HIGH (ref 65–99)

## 2017-04-12 LAB — COMPREHENSIVE METABOLIC PANEL
ALBUMIN: 3.6 g/dL (ref 3.5–5.0)
ALT: 18 U/L (ref 17–63)
ANION GAP: 7 (ref 5–15)
AST: 19 U/L (ref 15–41)
Alkaline Phosphatase: 66 U/L (ref 38–126)
BUN: 29 mg/dL — AB (ref 6–20)
CO2: 26 mmol/L (ref 22–32)
Calcium: 9 mg/dL (ref 8.9–10.3)
Chloride: 108 mmol/L (ref 101–111)
Creatinine, Ser: 1.31 mg/dL — ABNORMAL HIGH (ref 0.61–1.24)
GFR calc non Af Amer: 51 mL/min — ABNORMAL LOW (ref >60)
GFR, EST AFRICAN AMERICAN: 59 mL/min — AB (ref >60)
GLUCOSE: 142 mg/dL — AB (ref 65–99)
POTASSIUM: 4.2 mmol/L (ref 3.5–5.1)
SODIUM: 141 mmol/L (ref 135–145)
TOTAL PROTEIN: 7.3 g/dL (ref 6.5–8.1)
Total Bilirubin: 1 mg/dL (ref 0.3–1.2)

## 2017-04-12 LAB — PROTIME-INR
INR: 1.07
PROTHROMBIN TIME: 13.8 s (ref 11.4–15.2)

## 2017-04-12 LAB — CBC
HCT: 41.9 % (ref 39.0–52.0)
Hemoglobin: 14.1 g/dL (ref 13.0–17.0)
MCH: 32.7 pg (ref 26.0–34.0)
MCHC: 33.7 g/dL (ref 30.0–36.0)
MCV: 97.2 fL (ref 78.0–100.0)
PLATELETS: 199 10*3/uL (ref 150–400)
RBC: 4.31 MIL/uL (ref 4.22–5.81)
RDW: 13.4 % (ref 11.5–15.5)
WBC: 8.7 10*3/uL (ref 4.0–10.5)

## 2017-04-12 LAB — APTT: aPTT: 30 seconds (ref 24–36)

## 2017-04-12 LAB — RAPID URINE DRUG SCREEN, HOSP PERFORMED
AMPHETAMINES: NOT DETECTED
BARBITURATES: NOT DETECTED
Benzodiazepines: NOT DETECTED
Cocaine: NOT DETECTED
Opiates: NOT DETECTED
TETRAHYDROCANNABINOL: NOT DETECTED

## 2017-04-12 LAB — DIFFERENTIAL
BASOS PCT: 0 %
Basophils Absolute: 0 10*3/uL (ref 0.0–0.1)
EOS ABS: 0.3 10*3/uL (ref 0.0–0.7)
EOS PCT: 3 %
Lymphocytes Relative: 21 %
Lymphs Abs: 1.8 10*3/uL (ref 0.7–4.0)
MONO ABS: 0.8 10*3/uL (ref 0.1–1.0)
Monocytes Relative: 9 %
NEUTROS PCT: 67 %
Neutro Abs: 5.8 10*3/uL (ref 1.7–7.7)

## 2017-04-12 LAB — TROPONIN I: Troponin I: <0.03 ng/mL (ref <0.03)

## 2017-04-12 LAB — ETHANOL

## 2017-04-12 MED ORDER — OXYCODONE-ACETAMINOPHEN 5-325 MG PO TABS: 1.0000 | ORAL_TABLET | ORAL | Status: DC | PRN

## 2017-04-12 MED ORDER — TACROLIMUS 1 MG PO CAPS
3.0000 mg | ORAL_CAPSULE | Freq: Every day | ORAL | Status: DC
  Administered 2017-04-12: 1 mg via ORAL
  Filled 2017-04-12 (×4): qty 3

## 2017-04-12 MED ORDER — SODIUM CHLORIDE 0.9 % IV SOLN: INTRAVENOUS | Status: AC

## 2017-04-12 MED ORDER — INSULIN ASPART 100 UNIT/ML ~~LOC~~ SOLN
0.0000 [IU] | Freq: Three times a day (TID) | SUBCUTANEOUS | Status: DC
  Administered 2017-04-12: 2 [IU] via SUBCUTANEOUS
  Administered 2017-04-12 – 2017-04-13 (×3): 1 [IU] via SUBCUTANEOUS
  Administered 2017-04-14: 2 [IU] via SUBCUTANEOUS
  Administered 2017-04-14 – 2017-04-15 (×3): 1 [IU] via SUBCUTANEOUS
  Administered 2017-04-15 – 2017-04-16 (×2): 2 [IU] via SUBCUTANEOUS

## 2017-04-12 MED ORDER — ADULT MULTIVITAMIN W/MINERALS CH
1.0000 | ORAL_TABLET | Freq: Every day | ORAL | Status: DC
  Administered 2017-04-12 – 2017-04-16 (×5): 1 via ORAL
  Filled 2017-04-12 (×5): qty 1

## 2017-04-12 MED ORDER — LEVOTHYROXINE SODIUM 137 MCG PO TABS
137.0000 ug | ORAL_TABLET | Freq: Every day | ORAL | Status: DC
  Administered 2017-04-12 – 2017-04-16 (×5): 137 ug via ORAL
  Filled 2017-04-12 (×6): qty 1

## 2017-04-12 MED ORDER — INSULIN ASPART 100 UNIT/ML ~~LOC~~ SOLN
0.0000 [IU] | Freq: Every day | SUBCUTANEOUS | Status: DC
  Administered 2017-04-15: 1 [IU] via SUBCUTANEOUS

## 2017-04-12 MED ORDER — TACROLIMUS 1 MG PO CAPS
1.0000 mg | ORAL_CAPSULE | Freq: Two times a day (BID) | ORAL | Status: DC
  Administered 2017-04-12 – 2017-04-16 (×8): 1 mg via ORAL
  Filled 2017-04-12 (×10): qty 1

## 2017-04-12 MED ORDER — TACROLIMUS 1 MG PO CAPS
2.0000 mg | ORAL_CAPSULE | Freq: Every day | ORAL | Status: DC
  Filled 2017-04-12 (×3): qty 2

## 2017-04-12 MED ORDER — ENOXAPARIN SODIUM 40 MG/0.4ML ~~LOC~~ SOLN
40.0000 mg | SUBCUTANEOUS | Status: DC
  Administered 2017-04-12 – 2017-04-16 (×5): 40 mg via SUBCUTANEOUS
  Filled 2017-04-12 (×5): qty 0.4

## 2017-04-12 MED ORDER — ACETAMINOPHEN 160 MG/5ML PO SOLN: 650.0000 mg | ORAL | Status: DC | PRN

## 2017-04-12 MED ORDER — PERFLUTREN LIPID MICROSPHERE
1.0000 mL | INTRAVENOUS | Status: AC | PRN
  Administered 2017-04-12 (×2): 2 mL via INTRAVENOUS
  Filled 2017-04-12: qty 10

## 2017-04-12 MED ORDER — INSULIN GLARGINE 100 UNIT/ML ~~LOC~~ SOLN
30.0000 [IU] | Freq: Every day | SUBCUTANEOUS | Status: DC
  Administered 2017-04-12 – 2017-04-15 (×4): 30 [IU] via SUBCUTANEOUS
  Filled 2017-04-12 (×5): qty 0.3

## 2017-04-12 MED ORDER — ACETAMINOPHEN 325 MG PO TABS: 650.0000 mg | ORAL_TABLET | ORAL | Status: DC | PRN

## 2017-04-12 MED ORDER — INFLUENZA VAC SPLIT HIGH-DOSE 0.5 ML IM SUSY
0.5000 mL | PREFILLED_SYRINGE | INTRAMUSCULAR | Status: AC
  Administered 2017-04-13: 0.5 mL via INTRAMUSCULAR
  Filled 2017-04-12: qty 0.5

## 2017-04-12 MED ORDER — DOCUSATE SODIUM 100 MG PO CAPS
100.0000 mg | ORAL_CAPSULE | Freq: Every day | ORAL | Status: DC
  Administered 2017-04-12 – 2017-04-15 (×4): 100 mg via ORAL
  Filled 2017-04-12 (×4): qty 1

## 2017-04-12 MED ORDER — ADULT MULTIVITAMIN W/MINERALS CH
1.0000 | ORAL_TABLET | Freq: Every day | ORAL | Status: DC
  Filled 2017-04-12 (×3): qty 1

## 2017-04-12 MED ORDER — ACETAMINOPHEN 650 MG RE SUPP: 650.0000 mg | RECTAL | Status: DC | PRN

## 2017-04-12 MED ORDER — INSULIN ASPART 100 UNIT/ML ~~LOC~~ SOLN
6.0000 [IU] | Freq: Three times a day (TID) | SUBCUTANEOUS | Status: DC
  Administered 2017-04-12 – 2017-04-16 (×13): 6 [IU] via SUBCUTANEOUS

## 2017-04-12 MED ORDER — ASPIRIN EC 81 MG PO TBEC
81.0000 mg | DELAYED_RELEASE_TABLET | Freq: Every day | ORAL | Status: DC
  Administered 2017-04-12 – 2017-04-15 (×4): 81 mg via ORAL
  Filled 2017-04-12 (×4): qty 1

## 2017-04-12 MED ORDER — STROKE: EARLY STAGES OF RECOVERY BOOK
Freq: Once | Status: AC
  Administered 2017-04-12: 10:00:00
  Filled 2017-04-12: qty 1

## 2017-04-12 NOTE — Progress Notes (Signed)
*  PRELIMINARY RESULTS* Echocardiogram 2D Echocardiogram has been performed with Definity.  Samuel Germany 04/12/2017, 3:14 PM

## 2017-04-12 NOTE — Progress Notes (Signed)
Patient admitted this morning, see detailed H&P by Dr Myna Hidalgo 76 year old male with a history of liver cirrhosis status post liver transplantation, diabetes mellitus, hypertension CK D stage II hypothyroidism came with acute left leg weakness. MRI brain showed 1 cm acute infarct in the right corona radiata. No significant focal deficit. Neurology has been consulted for further recommendations. Stroke workup underway.

## 2017-04-12 NOTE — Care Management Note (Signed)
Case Management Note  Patient Details  Name: [name] MRN: [id number] Date of Birth: [birth year]  Subjective/Objective:                  Admitted with acute CVA. Pt is from home, lives with his wife. Dtr is POA and manages their medical and financial issues. Pt has RW to use if needed. PT has recommended HH PT. Pt agreeable, wife has used [facility] in the past and pt would like to use them. Pt communicates no further needs or concerns about DC. Wife and dtr at bedside for DC plan discussion.  Action/Plan: DC home, most likely tomorrow, with [facility] PT with [facility]. Vaughan Basta, [facility] rep, aware of referral and will pull pt info from [facility]. Pt aware HH has 48 hrs to make first visit. Pt in [facility] registry, enrolled in [place] transition calls for CVA. Pt explained Emmi calls and given flyer.  Expected Discharge Date:     04/13/2017             Expected Discharge Plan:  [place]  In-House Referral:  NA  Discharge planning Services  CM Consult  Post Acute Care Choice:  Home Health Choice offered to:   Pt, spouse, dtr  HH Arranged:  PT [place] Agency:  [place]  Status of Service:  Completed, signed off  Sherald Barge, RN 04/12/2017, 12:45 PM

## 2017-04-12 NOTE — H&P (Signed)
History and Physical    VICTORIA EUCEDA TOI:[id number] DOB: Mar 26, 1941 DOA: 04/12/2017  PCP: Sharilyn Sites, MD   Patient coming from: Home  Chief Complaint: Left leg weakness   HPI: [name] is a 76 y.o. male with medical history significant for cirrhosis status post liver transplantation, insulin-dependent diabetes mellitus, hypertension, chronic kidney disease stage II, and hypothyroidism, now presenting to the emergency department with acute left leg weakness. Patient reports that he was in his usual state of health, had an uneventful day and played golf yesterday, and went to bed in his usual state approximately 11 PM. Patient reports waking at approximately 3 AM to urinate and had difficulty using his left leg at that time. He also reports some slight dizziness. He denies any recent headache, change in vision or hearing, chest pain, or palpitations. He has never experienced similar symptoms previously. Denies any recent fall or trauma. Denies any use of alcohol or illicit substances.  ED Course: Upon arrival to the ED, patient is found to be afebrile, saturating well on room air, slightly hypertensive, and with vitals otherwise stable. EKG features a sinus rhythm. Noncontrast head CT is negative for acute intracranial abnormality, but notable for old infarcts. She panel reveals a serum creatinine 1.31, consistent with his apparent baseline. CBC is unremarkable, INR is within normal limits, and ethanol level was undetectable. Neurology was consulted and recommended a medical admission for further evaluation and management of suspected acute ischemic CVA.  Review of Systems:  All other systems reviewed and apart from HPI, are negative.  Past Medical History:  Diagnosis Date  . Anemia   . Biliary calculi, common bile duct   . CRI (chronic renal insufficiency)   . DM (diabetes mellitus) ([place])    Type II  . Elevated liver enzymes   . Esophageal varices ([place]) 03/19/11   mrcp  . History  of ERCP 03/21/2011   Extrahepatic biliary obstruction secondary to occluded biliary stent and colon no cholelithiasis, status post sphincterotomy, stent removal and replacement and stone extraction.  Marland Kitchen HTN (hypertension)   . Hypothyroidism   . NASH (nonalcoholic steatohepatitis) 2006   liver transplant  . Pancreatitis chronic 03/19/11   mrcp  . S/P colonoscopy with polypectomy 2004   Dr Gala Romney  . S/P endoscopy 03/2004   Hx esophgaeal varices, pyloric channel ulcer prior to transplant  . Squamous cell cancer of external ear    pinna    Past Surgical History:  Procedure Laterality Date  . BILE DUCT STENT PLACEMENT  03/21/11   Dr. Gala Romney  . CHOLECYSTECTOMY  2006  . COLONOSCOPY  08/12/2002   Dr. Gala Romney- somewhat diffusely friable rectum and colon, polyp at 30 cm.  . COLONOSCOPY N/A 09/08/2012   Procedure: COLONOSCOPY;  Surgeon: Daneil Dolin, MD;  Location: [facility];  Service: Endoscopy;  Laterality: N/A;  10:30  . ERCP  03/21/2011   Dr. Gala Romney- ERCP with removal of pediatric feeding tube, endoscopic sphinecterotomy with balloon dilation of ampulla followed by stone extraction and stent placement  . ESOPHAGOGASTRODUODENOSCOPY  03/20/2004   Dr. Nash Mantis 2 esophageal varices o/w normal esophageal mucosa, mild changes of snake skin in the gastric mucosa consistent with portal gastropathy, multiple antral erosions  and  a single pyloric channel ulcer benign appearance o/w normal gastric mucosa, normal D1 and S2  . KNEE ARTHROSCOPY WITH EXCISION PLICA Left 8/33/8250   Procedure: KNEE ARTHROSCOPY WITH EXCISION PLICA;  Surgeon: Carole Civil, MD;  Location: [facility];  Service: Orthopedics;  Laterality: Left;  . KNEE ARTHROSCOPY WITH MEDIAL MENISECTOMY Left 11/21/2012   Procedure: KNEE ARTHROSCOPY WITH MEDIAL AND LATERAL MENISECTOMY;  Surgeon: Carole Civil, MD;  Location: [facility];  Service: Orthopedics;  Laterality: Left;  . LIVER TRANSPLANT  2006   UVA-NASH cirrhosis  . MOUTH SURGERY  may  2012  . MRCP  03/19/11   biliary ductal dilatation- stones. esophageal varices  . SPHINCTEROTOMY  03/21/2011   Procedure: SPHINCTEROTOMY;  Surgeon: Daneil Dolin, MD;  Location: [facility];  Service: Gastroenterology;;     reports that he has never smoked. He has never used smokeless tobacco. He reports that he does not drink alcohol or use drugs.  Allergies  Allergen Reactions  . Insulin Glargine Rash    Itching, reaction required epinephrine shot  . Contrast Media [Iodinated Diagnostic Agents] Rash    Family History  Problem Relation Age of Onset  . Cirrhosis Father        nash  . Lymphoma Mother   . Diabetes Daughter      Prior to Admission medications   Medication Sig Start Date End Date Taking? Authorizing Provider  aspirin 81 MG tablet Take 81 mg by mouth daily.     Yes [provider]  docusate sodium (COLACE) 100 MG capsule Take 100 mg by mouth at bedtime.   Yes [provider]  insulin aspart (NOVOLOG) 100 UNIT/ML FlexPen Inject 12-18 Units into the skin 3 (three) times daily with meals. 09/28/16  Yes Nida, Marella Chimes, MD  Insulin Glargine (LANTUS SOLOSTAR) 100 UNIT/ML Solostar Pen Inject 32 Units into the skin daily at 10 pm. 09/28/16  Yes Nida, Marella Chimes, MD  levothyroxine (SYNTHROID, LEVOTHROID) 137 MCG tablet Take 1 tablet (137 mcg total) by mouth daily before breakfast. 01/28/17  Yes Nida, Marella Chimes, MD  lisinopril (PRINIVIL,ZESTRIL) 20 MG tablet Take 20 mg by mouth daily.  02/24/11  Yes [provider]  Multiple Vitamins-Minerals (MULTIVITAMIN WITH MINERALS) tablet Take 1 tablet by mouth daily.     Yes [provider]  ONE TOUCH ULTRA TEST test strip USE AS DIRECTED TO TEST BLOOD SUGAR 4 TIMES DAILY. 01/11/17  Yes Nida, Marella Chimes, MD  oxyCODONE-acetaminophen (PERCOCET/ROXICET) 5-325 MG per tablet Take 1 tablet by mouth every 4 (four) hours as needed for pain. 10/14/12  Yes Carole Civil, MD  predniSONE (DELTASONE)  10 MG tablet Take 10 mg by mouth as needed.   Yes [provider]  Specialty Vitamins Products (MAGNESIUM, AMINO ACID CHELATE,) 133 MG tablet Take 1 tablet by mouth 2 (two) times daily. Magnesium 133mg     Yes [provider]  tacrolimus (PROGRAF) 1 MG capsule Take 2-3 mg by mouth 2 (two) times daily. Takes 3 pills every morning and 2 pills at supper   Yes [provider]    Physical Exam: Vitals:   04/12/17 0515 04/12/17 0530 04/12/17 0545 04/12/17 0600  BP: (!) 147/89 140/90 138/82 (!) 147/88  Pulse: 69 70 67   Resp: 18 17 17 17   Temp:      TempSrc:      SpO2: 92% 93% 93%   Weight:      Height:          Constitutional: NAD, calm, comfortable Eyes: PERTLA, lids and conjunctivae normal ENMT: Mucous membranes are moist. Posterior pharynx clear of any exudate or lesions.   Neck: normal, supple, no masses, no thyromegaly Respiratory: clear to auscultation bilaterally, no wheezing, no crackles. Normal respiratory effort.   Cardiovascular:  S1 & S2 heard, regular rate and rhythm. No extremity edema. No significant JVD. Abdomen: No distension, no tenderness, no masses palpated. Bowel sounds normal.  Musculoskeletal: no clubbing / cyanosis. No joint deformity upper and lower extremities.  Skin: no significant rashes, lesions, ulcers. Warm, dry, well-perfused. Neurologic: CN 2-12 grossly intact. Sensation intact, patellar DTR's normal. Strength 5/5 in upper and lower extremities bilaterally. Dysmetria involving LLE.  Psychiatric: Alert and oriented x 3. Very pleasant and cooperative.     Labs on Admission: I have personally reviewed following labs and imaging studies  CBC:  Recent Labs Lab 04/12/17 0450 04/12/17 0507  WBC 8.7  --   NEUTROABS 5.8  --   HGB 14.1 14.6  HCT 41.9 43.0  MCV 97.2  --   PLT 199  --    Basic Metabolic Panel:  Recent Labs Lab 04/12/17 0450 04/12/17 0507  NA 141 143  K 4.2 4.2  CL 108 105  CO2 26  --   GLUCOSE 142*  141*  BUN 29* 30*  CREATININE 1.31* 1.40*  CALCIUM 9.0  --    GFR: Estimated Creatinine Clearance: 57.3 mL/min (A) (by C-G formula based on SCr of 1.4 mg/dL (H)). Liver Function Tests:  Recent Labs Lab 04/12/17 0450  AST 19  ALT 18  ALKPHOS 66  BILITOT 1.0  PROT 7.3  ALBUMIN 3.6   No results for input(s): LIPASE, AMYLASE in the last 168 hours. No results for input(s): AMMONIA in the last 168 hours. Coagulation Profile:  Recent Labs Lab 04/12/17 0450  INR 1.07   Cardiac Enzymes:  Recent Labs Lab 04/12/17 0450  TROPONINI <0.03   BNP (last 3 results) No results for input(s): PROBNP in the last 8760 hours. HbA1C: No results for input(s): HGBA1C in the last 72 hours. CBG: No results for input(s): GLUCAP in the last 168 hours. Lipid Profile: No results for input(s): CHOL, HDL, LDLCALC, TRIG, CHOLHDL, LDLDIRECT in the last 72 hours. Thyroid Function Tests: No results for input(s): TSH, T4TOTAL, FREET4, T3FREE, THYROIDAB in the last 72 hours. Anemia Panel: No results for input(s): VITAMINB12, FOLATE, FERRITIN, TIBC, IRON, RETICCTPCT in the last 72 hours. Urine analysis:    Component Value Date/Time   COLORURINE YELLOW 03/18/2011 1400   APPEARANCEUR CLEAR 03/18/2011 1400   LABSPEC 1.020 03/18/2011 1400   PHURINE 5.0 03/18/2011 1400   GLUCOSEU >1000 (A) 03/18/2011 1400   HGBUR TRACE (A) 03/18/2011 1400   BILIRUBINUR MODERATE (A) 03/18/2011 1400   KETONESUR 15 (A) 03/18/2011 1400   PROTEINUR NEGATIVE 03/18/2011 1400   UROBILINOGEN 0.2 03/18/2011 1400   NITRITE NEGATIVE 03/18/2011 1400   LEUKOCYTESUR NEGATIVE 03/18/2011 1400   Sepsis Labs: @LABRCNTIP (procalcitonin:4,lacticidven:4) )No results found for this or any previous visit (from the past 240 hour(s)).   Radiological Exams on Admission: Ct Head Code Stroke Wo Contrast  Result Date: 04/12/2017 CLINICAL DATA:  Code stroke. Acute onset LEFT-sided weakness. History of hypertension, diabetes. EXAM: CT HEAD  WITHOUT CONTRAST TECHNIQUE: Contiguous axial images were obtained from the base of the skull through the vertex without intravenous contrast. COMPARISON:  CT HEAD November 03, 2004 FINDINGS: BRAIN: No intraparenchymal hemorrhage, mass effect nor midline shift. The ventricles and sulci are normal for age. Patchy supratentorial white matter hypodensities within normal range for patient's age, though non-specific are most compatible with chronic small vessel ischemic disease. Old LEFT basal ganglia and LEFT thalamus lacunar infarcts. No acute large vascular territory infarcts. No abnormal extra-axial fluid collections. Basal cisterns are patent. VASCULAR: Moderate to  severe calcific atherosclerosis of the carotid siphons. Tiny densities along bilateral middle cerebral artery's compatible with atherosclerosis. SKULL: No skull fracture. No significant scalp soft tissue swelling. SINUSES/ORBITS: The mastoid air-cells and included paranasal sinuses are well-aerated.The included ocular globes and orbital contents are non-suspicious. OTHER: None. ASPECTS [facility] Stroke Program Early CT Score) - Ganglionic level infarction (caudate, lentiform nuclei, internal capsule, insula, M1-M3 cortex): 7 - Supraganglionic infarction (M4-M6 cortex): 3 Total score (0-10 with 10 being normal): 10 IMPRESSION: 1. Negative noncontrast CT HEAD for age. 2. ASPECTS is 10. 3. Critical Value/emergent results were called by telephone at the time of interpretation on 04/12/2017 at 5:10 am to Dr. Rolland Porter , who verbally acknowledged these results. Electronically Signed   By: Elon Alas M.D.   On: 04/12/2017 05:11    EKG: Independently reviewed. Sinus rhythm.   Assessment/Plan  1. Gait disturbance   - Pt presents with difficulty using his left leg, noted by family to drag the leg  - Head CT is negative for acute intracranial abnormality; there is clumsiness involving LLE on exam  - Neurology was consulted by the ED physician and  recommended admission for stroke workup  - Plan to observe on telemetry with frequent neuro checks, PT/OT evals, control of risk-factors  - Check MRI brain, MRA head, carotid dopplers, echo, fasting lipid panel, and A1c   2. Insulin-dependent DM  - A1c was 7.4% in May '18  - Managed at home with Lantus 32 units qHS and Novolog 12-18 TID  - Plan to check CBG's with meals and qHS - Continue Lantus 30 units qHS with Novolog 6 units TID and Novolog correctional per sliding-scale   3. Hypertension  - BP slightly elevated in ED  - Managed with lisinopril at home, will hold in acute-phase of suspected stroke   4. CKD stage II  - SCr is 1.31 on admission, consistent with his apparent baseline  - Avoid dehydration or hypotension, renally-dose medications as needed   5. Liver transplant recipient  - No acute issues  - Continue Prograf   6. Hypothyroidism  - Continue Synthroid     DVT prophylaxis: sq heparin  Code Status: Full  Family Communication: Wife and daughter updated at bedside Disposition Plan: Observe on telemetry Consults called: Neurology Admission status: Observation    Vianne Bulls, MD [facility] Pager [phone]  If 7PM-7AM, please contact night-coverage [email or web address] Password [email or web address]  04/12/2017, 6:06 AM

## 2017-04-12 NOTE — Consult Note (Signed)
[place] A. Merlene Laughter, MD     [email or web address]          [name] is an 76 y.o. male.   ASSESSMENT/PLAN: 1. Acute onset of weakness involving face arm and leg on the left side:  The presentation and MRI findings are consistent with lacunar infarct on the contralateral side.  Risk factors hypertension, diabetes and age along with likely untreated obstructive sleep apnea syndrome.  We have instructed the patient to increase his aspirin to 325 mg.  Continue with control of blood pressure, diabetes and dyslipidemia.   I recommend that the patient be evaluated for 3 week of physical therapy at a skilled facility.  2.  Likely untreated obstructive sleep apnea syndrome: A formal sleep study should be done once the patient has been discharged from the hospital.    The patient is a 76 year old right-handed white male who presents with the acute onset of left hemi paresis affecting face arm and leg associated with dysarthria.  The patient has had a fluctuating course since his symptoms on yesterday.  He was on aspirin 81 mg. He has a history of hypertension and diabetes.  His  Blood sugars were brought in on his check and they have been running in the high 100s although sometimes in the 200s.  The patient does not report having other symptoms at this time.  There is no chest pain, dyspnea, GI GU symptoms.  He does not report heaviness on the right side, headaches or dizziness.  The review of systems otherwise negative.     GENERAL: This is an obese male in no acute distress.  HEENT:   He has a very large neck with large tongue and crowded posterior airway.  ABDOMEN: soft  EXTREMITIES: No edema   BACK:  This is normal.  SKIN: Normal by inspection.    MENTAL STATUS: Alert and oriented including orientation to age and the month. Speech is mildly dysarthric; language and cognition are generally intact. Judgment and insight normal.   CRANIAL NERVES: Pupils are equal,  round and reactive to light and accomodation; extra ocular movements are full, there is no significant nystagmus; visual fields are full; upper and lower facial muscles are normal in strength and symmetric, there is no flattening of the nasolabial folds; tongue is midline; uvula is midline; shoulder elevation is normal.  MOTOR:  There is mild weakness of the left upper extremity left leg graded as 4+/ 5. Both are associated with a mild drift.  Bulk and tone are normal.  The right side shows normal tone, bulk and strength and not associated with drift.  COORDINATION: Left finger to nose is normal, right finger to nose is normal, No rest tremor; no intention tremor; no postural tremor; no bradykinesia.  REFLEXES: Deep tendon reflexes are symmetrical and normal. Babinski reflexes are flexor bilaterally.   SENSATION: Normal to light touch, temperature, and pinprick.     NIH stroke scale is 3.     Blood pressure (!) 146/66, pulse 70, temperature 98 F (36.7 C), temperature source Oral, resp. rate 20, height 5' 9.5" (1.765 m), weight 260 lb (117.9 kg), SpO2 95 %.  Past Medical History:  Diagnosis Date  . Anemia   . Biliary calculi, common bile duct   . CRI (chronic renal insufficiency)   . DM (diabetes mellitus) ([place])    Type II  . Elevated liver enzymes   . Esophageal varices ([place]) 03/19/11   mrcp  . History of ERCP 03/21/2011  Extrahepatic biliary obstruction secondary to occluded biliary stent and colon no cholelithiasis, status post sphincterotomy, stent removal and replacement and stone extraction.  Mar[place] Kitchen HTN (hypertension)   . Hypothyroidism   . NASH (nonalcoholic steatohepatitis) 2006   liver transplant  . Pancreatitis chronic 03/19/11   mrcp  . S/P colonoscopy with polypectomy 2004   Dr Gala Romney  . S/P endoscopy 03/2004   Hx esophgaeal varices, pyloric channel ulcer prior to transplant  . Squamous cell cancer of external ear    pinna    Past Surgical History:  Procedure  Laterality Date  . BILE DUCT STENT PLACEMENT  03/21/11   Dr. Gala Romney  . CHOLECYSTECTOMY  2006  . COLONOSCOPY  08/12/2002   Dr. Gala Romney- somewhat diffusely friable rectum and colon, polyp at 30 cm.  . COLONOSCOPY N/A 09/08/2012   Procedure: COLONOSCOPY;  Surgeon: Daneil Dolin, MD;  Location: [facility];  Service: Endoscopy;  Laterality: N/A;  10:30  . ERCP  03/21/2011   Dr. Gala Romney- ERCP with removal of pediatric feeding tube, endoscopic sphinecterotomy with balloon dilation of ampulla followed by stone extraction and stent placement  . ESOPHAGOGASTRODUODENOSCOPY  03/20/2004   Dr. Nash Mantis 2 esophageal varices o/w normal esophageal mucosa, mild changes of snake skin in the gastric mucosa consistent with portal gastropathy, multiple antral erosions  and  a single pyloric channel ulcer benign appearance o/w normal gastric mucosa, normal D1 and S2  . KNEE ARTHROSCOPY WITH EXCISION PLICA Left 10/04/9240   Procedure: KNEE ARTHROSCOPY WITH EXCISION PLICA;  Surgeon: Carole Civil, MD;  Location: [facility];  Service: Orthopedics;  Laterality: Left;  . KNEE ARTHROSCOPY WITH MEDIAL MENISECTOMY Left 11/21/2012   Procedure: KNEE ARTHROSCOPY WITH MEDIAL AND LATERAL MENISECTOMY;  Surgeon: Carole Civil, MD;  Location: [facility];  Service: Orthopedics;  Laterality: Left;  . LIVER TRANSPLANT  2006   UVA-NASH cirrhosis  . MOUTH SURGERY  may 2012  . MRCP  03/19/11   biliary ductal dilatation- stones. esophageal varices  . SPHINCTEROTOMY  03/21/2011   Procedure: SPHINCTEROTOMY;  Surgeon: Daneil Dolin, MD;  Location: [facility];  Service: Gastroenterology;;    Family History  Problem Relation Age of Onset  . Cirrhosis Father        nash  . Lymphoma Mother   . Diabetes Daughter     Social History:  reports that he has never smoked. He has never used smokeless tobacco. He reports that he does not drink alcohol or use drugs.  Allergies:  Allergies  Allergen Reactions  . Contrast Media [Iodinated [employer]] Rash    Medications: Prior to Admission medications   Medication Sig Start Date End Date Taking? Authorizing Provider  aspirin 81 MG tablet Take 81 mg by mouth daily.     Yes [provider]  docusate sodium (COLACE) 100 MG capsule Take 100 mg by mouth at bedtime.   Yes [provider]  insulin aspart (NOVOLOG) 100 UNIT/ML FlexPen Inject 12-18 Units into the skin 3 (three) times daily with meals. 09/28/16  Yes Nida, Marella Chimes, MD  Insulin Glargine (LANTUS SOLOSTAR) 100 UNIT/ML Solostar Pen Inject 32 Units into the skin daily at 10 pm. 09/28/16  Yes Nida, Marella Chimes, MD  levothyroxine (SYNTHROID, LEVOTHROID) 137 MCG tablet Take 1 tablet (137 mcg total) by mouth daily before breakfast. 01/28/17  Yes Nida, Marella Chimes, MD  lisinopril (PRINIVIL,ZESTRIL) 20 MG tablet Take 20 mg by mouth daily.  02/24/11  Yes [provider]  Multiple Vitamins-Minerals (MULTIVITAMIN WITH  MINERALS) tablet Take 1 tablet by mouth daily.     Yes [provider]  oxyCODONE-acetaminophen (PERCOCET/ROXICET) 5-325 MG per tablet Take 1 tablet by mouth every 4 (four) hours as needed for pain. 10/14/12  Yes Carole Civil, MD  predniSONE (DELTASONE) 10 MG tablet Take 5 mg by mouth as needed.    Yes [provider]  Specialty Vitamins Products (MAGNESIUM, AMINO ACID CHELATE,) 133 MG tablet Take 1 tablet by mouth 2 (two) times daily. Magnesium 159m    Yes [provider]  tacrolimus (PROGRAF) 1 MG capsule Take 1 mg by mouth 2 (two) times daily. 105:  Pt states he takes 12mbid with meals (which is a change from previous dose of 62m57maily)   Yes [provider]    Scheduled Meds: . aspirin EC  81 mg Oral Daily  . docusate sodium  100 mg Oral QHS  . enoxaparin (LOVENOX) injection  40 mg Subcutaneous Q24H  . [START ON 04/13/2017] Influenza vac split quadrivalent PF  0.5 mL Intramuscular Tomorrow-1000  . insulin aspart  0-5 Units Subcutaneous QHS    . insulin aspart  0-9 Units Subcutaneous TID WC  . insulin aspart  6 Units Subcutaneous TID WC  . insulin glargine  30 Units Subcutaneous QHS  . levothyroxine  137 mcg Oral QAC breakfast  . multivitamin with minerals  1 tablet Oral Daily  . tacrolimus  1 mg Oral BID WC   Continuous Infusions: PRN Meds:.acetaminophen **OR** acetaminophen (TYLENOL) oral liquid 160 mg/5 mL **OR** acetaminophen, oxyCODONE-acetaminophen     Results for orders placed or performed during the hospital encounter of 04/12/17 (from the past 48 hour(s))  Ethanol     Status: None   Collection Time: 04/12/17  4:50 AM  Result Value Ref Range   Alcohol, Ethyl (B) <5 <10 mg/dL    Comment:        LOWEST DETECTABLE LIMIT FOR SERUM ALCOHOL IS 10 mg/dL FOR MEDICAL PURPOSES ONLY Please note change in reference range.   Protime-INR     Status: None   Collection Time: 04/12/17  4:50 AM  Result Value Ref Range   Prothrombin Time 13.8 11.4 - 15.2 seconds   INR 1.07   APTT     Status: None   Collection Time: 04/12/17  4:50 AM  Result Value Ref Range   aPTT 30 24 - 36 seconds  CBC     Status: None   Collection Time: 04/12/17  4:50 AM  Result Value Ref Range   WBC 8.7 4.0 - 10.5 K/uL   RBC 4.31 4.22 - 5.81 MIL/uL   Hemoglobin 14.1 13.0 - 17.0 g/dL   HCT 41.9 39.0 - 52.0 %   MCV 97.2 78.0 - 100.0 fL   MCH 32.7 26.0 - 34.0 pg   MCHC 33.7 30.0 - 36.0 g/dL   RDW 13.4 11.5 - 15.5 %   Platelets 199 150 - 400 K/uL  Differential     Status: None   Collection Time: 04/12/17  4:50 AM  Result Value Ref Range   Neutrophils Relative % 67 %   Neutro Abs 5.8 1.7 - 7.7 K/uL   Lymphocytes Relative 21 %   Lymphs Abs 1.8 0.7 - 4.0 K/uL   Monocytes Relative 9 %   Monocytes Absolute 0.8 0.1 - 1.0 K/uL   Eosinophils Relative 3 %   Eosinophils Absolute 0.3 0.0 - 0.7 K/uL   Basophils Relative 0 %   Basophils Absolute 0.0 0.0 - 0.1 K/uL  Comprehensive metabolic panel     Status: Abnormal   Collection Time: 04/12/17  4:50 AM   Result Value Ref Range   Sodium 141 135 - 145 mmol/L   Potassium 4.2 3.5 - 5.1 mmol/L   Chloride 108 101 - 111 mmol/L   CO2 26 22 - 32 mmol/L   Glucose, Bld 142 (H) 65 - 99 mg/dL   BUN 29 (H) 6 - 20 mg/dL   Creatinine, Ser 1.31 (H) 0.61 - 1.24 mg/dL   Calcium 9.0 8.9 - 10.3 mg/dL   Total Protein 7.3 6.5 - 8.1 g/dL   Albumin 3.6 3.5 - 5.0 g/dL   AST 19 15 - 41 U/L   ALT 18 17 - 63 U/L   Alkaline Phosphatase 66 38 - 126 U/L   Total Bilirubin 1.0 0.3 - 1.2 mg/dL   GFR calc non Af Amer 51 (L) >60 mL/min   GFR calc Af Amer 59 (L) >60 mL/min    Comment: (NOTE) The eGFR has been calculated using the CKD EPI equation. This calculation has not been validated in all clinical situations. eGFR's persistently <60 mL/min signify possible Chronic Kidney Disease.    Anion gap 7 5 - 15  Troponin I     Status: None   Collection Time: 04/12/17  4:50 AM  Result Value Ref Range   Troponin I <0.03 <0.03 ng/mL  I-stat chem 8, ed     Status: Abnormal   Collection Time: 04/12/17  5:07 AM  Result Value Ref Range   Sodium 143 135 - 145 mmol/L   Potassium 4.2 3.5 - 5.1 mmol/L   Chloride 105 101 - 111 mmol/L   BUN 30 (H) 6 - 20 mg/dL   Creatinine, Ser 1.40 (H) 0.61 - 1.24 mg/dL   Glucose, Bld 141 (H) 65 - 99 mg/dL   Calcium, Ion 1.25 1.15 - 1.40 mmol/L   TCO2 26 22 - 32 mmol/L   Hemoglobin 14.6 13.0 - 17.0 g/dL   HCT 43.0 39.0 - 52.0 %  Glucose, capillary     Status: Abnormal   Collection Time: 04/12/17  7:48 AM  Result Value Ref Range   Glucose-Capillary 149 (H) 65 - 99 mg/dL   Comment 1 Notify RN    Comment 2 Document in Chart   Glucose, capillary     Status: Abnormal   Collection Time: 04/12/17 11:19 AM  Result Value Ref Range   Glucose-Capillary 158 (H) 65 - 99 mg/dL   Comment 1 Notify RN    Comment 2 Document in Chart   Urine rapid drug screen (hosp performed)     Status: None   Collection Time: 04/12/17  4:35 PM  Result Value Ref Range   Opiates NONE DETECTED NONE DETECTED    Cocaine NONE DETECTED NONE DETECTED   Benzodiazepines NONE DETECTED NONE DETECTED   Amphetamines NONE DETECTED NONE DETECTED   Tetrahydrocannabinol NONE DETECTED NONE DETECTED   Barbiturates NONE DETECTED NONE DETECTED    Comment:        DRUG SCREEN FOR MEDICAL PURPOSES ONLY.  IF CONFIRMATION IS NEEDED FOR ANY PURPOSE, NOTIFY LAB WITHIN 5 DAYS.        LOWEST DETECTABLE LIMITS FOR URINE DRUG SCREEN Drug Class       Cutoff (ng/mL) Amphetamine      1000 Barbiturate      200 Benzodiazepine   229 Tricyclics       798 Opiates          300 Cocaine  300 THC              50   Urinalysis, Routine w reflex microscopic     Status: Abnormal   Collection Time: 04/12/17  4:35 PM  Result Value Ref Range   Color, Urine YELLOW YELLOW   APPearance CLEAR CLEAR   Specific Gravity, Urine 1.016 1.005 - 1.030   pH 6.0 5.0 - 8.0   Glucose, UA NEGATIVE NEGATIVE mg/dL   Hgb urine dipstick SMALL (A) NEGATIVE   Bilirubin Urine NEGATIVE NEGATIVE   Ketones, ur NEGATIVE NEGATIVE mg/dL   Protein, ur NEGATIVE NEGATIVE mg/dL   Nitrite NEGATIVE NEGATIVE   Leukocytes, UA NEGATIVE NEGATIVE   RBC / HPF 6-30 0 - 5 RBC/hpf   WBC, UA 0-5 0 - 5 WBC/hpf   Bacteria, UA NONE SEEN NONE SEEN   Squamous Epithelial / LPF NONE SEEN NONE SEEN   Mucus PRESENT   Glucose, capillary     Status: Abnormal   Collection Time: 04/12/17  4:40 PM  Result Value Ref Range   Glucose-Capillary 119 (H) 65 - 99 mg/dL    Studies/Results:  BRAIN MRI MRA FINDINGS: MRI HEAD FINDINGS  Brain: 1 cm focus of restricted diffusion in the posterior right corona radiata.  Overall mild chronic small vessel ischemic change in the cerebral white matter. There has been a remote small vessel infarct in the left corona radiata. Age normal brain volume. No mass, hemorrhage, or hydrocephalus.  Vascular: Arterial findings below. Normal dural venous sinus flow voids.  Skull and upper cervical spine: Negative for marrow  lesion.  Sinuses/Orbits: Negative  MRA HEAD FINDINGS  Motion degraded. No branch occlusion or flow limiting stenosis seen in the anterior circulation. There is mild narrowing of the right ICA at the anterior genu. Codominant vertebral arteries. Mild left V4 segment narrowing. There is a high-grade proximal right P2 segment stenosis with less intense downstream flow. No flow seen beyond the mid left P2 segment. Negative for aneurysm.  IMPRESSION: Brain MRI:  1. 1 cm acute infarct in the right corona radiata. 2. Mild chronic small vessel ischemia in the cerebral white matter. Intracranial MRA:  1. Motion degraded. 2. Intracranial atherosclerosis with high-grade proximal right and mid left P2 segment stenoses. 3. Mild left V4 segment narrowing.    The brain MRI is reviewed in person and shows a small to moderate size acute infarct on DWI involving the centrum semiovale on the right side. It is seen about 2 and half cuts. There is evidence of the lacunar infarct with a septal malacia seen on T1 involving the centrum semiovale on the left side extending to the basilar ganglia. There is mild periventricular and deep white matter leukoencephalopathy. MRA shows stenosis of the right PCA. Additionally, there is luminal irregularities of the MCAs bilaterally.   CAROTID duplex Doppler: Unremarkable    Echocardiography: - Left ventricle: The cavity size was normal. Wall thickness was   normal. Systolic function was vigorous. The estimated ejection   fraction was in the range of 65% to 70%. Doppler parameters are   consistent with abnormal left ventricular relaxation (grade 1   diastolic dysfunction). - Aortic valve: AV is thickened, calcified , with no significant   stenosis.   Sharlena Kristensen A. Merlene Laughter, M.D.  Diplomate, Tax adviser of Psychiatry and Neurology ( Neurology). 04/12/2017, 7:07 PM

## 2017-04-12 NOTE — Progress Notes (Signed)
Code stroke  Called 459 am Beeper 5am Exam start 638 TRR  116 FBX  038 Completed in epic [place] called [phone]

## 2017-04-12 NOTE — Evaluation (Signed)
Occupational Therapy Evaluation Patient Details Name: [name] MRN: [id number] DOB: [birth year] Today's Date: 04/12/2017    History of Present Illness [name] is a 76 y.o. male with medical history significant for cirrhosis status post liver transplantation, insulin-dependent diabetes mellitus, hypertension, chronic kidney disease stage II, and hypothyroidism, now presenting to the emergency department with acute left leg weakness. Patient reports that he was in his usual state of health, had an uneventful day and played golf yesterday, and went to bed in his usual state approximately 11 PM. Patient reports waking at approximately 3 AM to urinate and had difficulty using his left leg at that time. He also reports some slight dizziness. He denies any recent headache, change in vision or hearing, chest pain, or palpitations. He has never experienced similar symptoms previously. MRI positive for right CVA   Clinical Impression   Pt finishing up with PT on OT entrance into room, agreeable to OT evaluation. PTA pt independent with B/IADL completion and functional mobility. During evaluation pt requiring supervision/min guard for standing tasks due to LLE weakness affecting balance. OT notes slight drift with LUE, formal MMT reveals strength WFL. Coordination and sensation are intact. Discussed discharge with pt, wife, and daughter, pt and family verbalize understanding of precautions and ADL compensatory strategies for balance deficits. No further OT services required at this time, pt will have supervision for ADL completion on return home.     Follow Up Recommendations  No OT follow up;Supervision/Assistance - 24 hour    Equipment Recommendations  None recommended by OT       Precautions / Restrictions Precautions Precautions: Fall Restrictions Weight Bearing Restrictions: No      Mobility Bed Mobility               General bed mobility comments: Pt up with PT on OT arrival    Transfers                 General transfer comment: Pt up with PT on OT arrival        ADL either performed or assessed with clinical judgement   ADL Overall ADL's : Needs assistance/impaired     Grooming: Supervision/safety;Min guard;Standing           Upper Body Dressing : Modified independent;Sitting   Lower Body Dressing: Supervision/safety;Sitting/lateral leans   Toilet Transfer: Supervision/safety;RW   Toileting- Clothing Manipulation and Hygiene: Modified independent;Sitting/lateral lean       Functional mobility during ADLs: Min guard;Rolling walker       Vision Baseline Vision/History: No visual deficits Patient Visual Report: No change from baseline Vision Assessment?: No apparent visual deficits            Pertinent Vitals/Pain Pain Assessment: No/denies pain     Hand Dominance Right   Extremity/Trunk Assessment Upper Extremity Assessment Upper Extremity Assessment: Overall WFL for tasks assessed;LUE deficits/detail LUE Deficits / Details: slight pronator drift; MMT 4+/5 strength   Lower Extremity Assessment Lower Extremity Assessment: Defer to PT evaluation   Cervical / Trunk Assessment Cervical / Trunk Assessment: Normal   Communication Communication Communication: No difficulties   Cognition Arousal/Alertness: Awake/alert Behavior During Therapy: WFL for tasks assessed/performed Overall Cognitive Status: Within Functional Limits for tasks assessed  Home Living Family/patient expects to be discharged to:: Private residence Living Arrangements: Spouse/significant other Available Help at Discharge: Family;Available PRN/intermittently Type of Home: House Home Access: Stairs to enter Entrance Stairs-Number of Steps: 3   Home Layout: Two level Alternate Level Stairs-Number of Steps: 12   Bathroom Shower/Tub: Occupational psychologist: Handicapped  height     Home Equipment: Environmental consultant - 2 wheels;Cane - single point;Shower seat          Prior Functioning/Environment Level of Independence: Independent                 OT Problem List: Decreased activity tolerance;Impaired balance (sitting and/or standing)       AM-PAC PT "6 Clicks" Daily Activity     Outcome Measure Help from another person eating meals?: None Help from another person taking care of personal grooming?: None Help from another person toileting, which includes using toliet, bedpan, or urinal?: A Little Help from another person bathing (including washing, rinsing, drying)?: A Little Help from another person to put on and taking off regular upper body clothing?: None Help from another person to put on and taking off regular lower body clothing?: A Little 6 Click Score: 21   End of Session Equipment Utilized During Treatment: Gait belt;Rolling walker  Activity Tolerance: Patient tolerated treatment well Patient left: in chair;with call bell/phone within reach;with family/visitor present  OT Visit Diagnosis: Muscle weakness (generalized) (M62.81)                Time: 5726-2035 OT Time Calculation (min): 15 min Charges:  OT General Charges $OT Visit: 1 Visit OT Evaluation $OT Eval Low Complexity: 1 Low G-Codes: OT G-codes **NOT FOR INPATIENT CLASS** Functional Assessment Tool Used: AM-PAC 6 Clicks Daily Activity Functional Limitation: Self care Self Care Current Status (D9741): At least 20 percent but less than 40 percent impaired, limited or restricted Self Care Goal Status (U3845): At least 20 percent but less than 40 percent impaired, limited or restricted Self Care Discharge Status [phone]): At least 20 percent but less than 40 percent impaired, limited or restricted    Guadelupe Sabin, OTR/L  [phone] 04/12/2017, 8:41 AM

## 2017-04-12 NOTE — Care Management Important Message (Signed)
Important Message  Patient Details  Name: [name] MRN: [id number] Date of Birth: Jun 26, 1941   Medicare Important Message Given:  Yes    Sherald Barge, RN 04/12/2017, 1:01 PM

## 2017-04-12 NOTE — ED Notes (Signed)
Paged Code Stroke @ [phone]. [facility] paged @ [phone].

## 2017-04-12 NOTE — ED Notes (Signed)
Pt states he doesn't need to urinate at this time, aware of DO

## 2017-04-12 NOTE — Evaluation (Signed)
Physical Therapy Evaluation Patient Details Name: [name] MRN: [id number] DOB: 04-25-1941 Today's Date: 04/12/2017   History of Present Illness   [name] is a 76 y.o. male with medical history significant for cirrhosis status post liver transplantation, insulin-dependent diabetes mellitus, hypertension, chronic kidney disease stage II, and hypothyroidism, now presenting to the emergency department with acute left leg weakness. Patient reports that he was in his usual state of health, had an uneventful day and played golf yesterday, and went to bed in his usual state approximately 11 PM. Patient reports waking at approximately 3 AM to urinate and had difficulty using his left leg at that time. He also reports some slight dizziness    Clinical Impression  Patient limited for taking steps without use of an assistive device due to LLE weakness and limited ankle dorsiflexion (see below).  Patient will have help at home per family and will benefit from continued physical therapy in hospital and recommended venue below to increase strength, balance, endurance for safe ADLs and gait.    Follow Up Recommendations Home health PT;Supervision/Assistance - 24 hour    Equipment Recommendations  None recommended by PT    Recommendations for Other Services       Precautions / Restrictions Precautions Precautions: Fall Restrictions Weight Bearing Restrictions: No      Mobility  Bed Mobility Overal bed mobility: Modified Independent             General bed mobility comments: uses siderail with longer than normal time for sitting up  Transfers Overall transfer level: Needs assistance Equipment used: 1 person hand held assist;Rolling walker (2 wheeled) Transfers: Sit to/from [facility] Sit to Stand: Min guard Stand pivot transfers: Min guard       General transfer comment: very unsteady with near loss of balance without assistive device (AD), improvement note  using RW  Ambulation/Gait Ambulation/Gait assistance: Min guard Ambulation Distance (Feet): 45 Feet Assistive device: Rolling walker (2 wheeled) Gait Pattern/deviations: Decreased step length - left;Decreased stance time - left;Decreased stride length   Gait velocity interpretation: Below normal speed for age/gender General Gait Details: very unsteady with LLE buckling/giving way when taking steps withotu AD, demonstrates limited left ankle dorsiflexion with mild foot slap when taking steps, has to hike hip to clear toes with fair/poor return for left heel to toe stepping  Stairs            Wheelchair Mobility    Modified Rankin (Stroke Patients Only)       Balance Overall balance assessment: Needs assistance Sitting-balance support: Feet supported Sitting balance-Leahy Scale: Good     Standing balance support: Bilateral upper extremity supported;No upper extremity supported;During functional activity Standing balance-Leahy Scale: Fair Standing balance comment: near loss of balance when not using AD                             Pertinent Vitals/Pain Pain Assessment: No/denies pain    Home Living Family/patient expects to be discharged to:: Private residence Living Arrangements: Spouse/significant other Available Help at Discharge: Family;Available PRN/intermittently Type of Home: House Home Access: Stairs to enter Entrance Stairs-Rails: Left Entrance Stairs-Number of Steps: 3 Home Layout: Two level Home Equipment: Walker - 2 wheels;Cane - single point;Shower seat      Prior Function Level of Independence: Independent               Hand Dominance   Dominant Hand: Right  Extremity/Trunk Assessment   Upper Extremity Assessment Upper Extremity Assessment: Defer to OT evaluation LUE Deficits / Details: slight pronator drift; MMT 4+/5 strength    Lower Extremity Assessment Lower Extremity Assessment: Overall WFL for tasks assessed;LLE  deficits/detail LLE Deficits / Details: grossly -3/5, left ankle dorsiflexion grossly -3/5    Cervical / Trunk Assessment Cervical / Trunk Assessment: Normal  Communication   Communication: No difficulties  Cognition Arousal/Alertness: Awake/alert Behavior During Therapy: WFL for tasks assessed/performed Overall Cognitive Status: Within Functional Limits for tasks assessed                                        General Comments      Exercises General Exercises - Lower Extremity Ankle Circles/Pumps: Seated;Left;AROM;10 reps   Assessment/Plan    PT Assessment Patient needs continued PT services  PT Problem List Decreased strength;Decreased activity tolerance;Decreased balance;Decreased mobility       PT Treatment Interventions Gait training;Stair training;Functional mobility training;Therapeutic activities;Therapeutic exercise;Patient/family education    PT Goals (Current goals can be found in the Care Plan section)  Acute Rehab PT Goals Patient Stated Goal: return home with family to assist PT Goal Formulation: With patient/family Time For Goal Achievement: 04/15/17 Potential to Achieve Goals: Good    Frequency 7X/week   Barriers to discharge        Co-evaluation               AM-PAC PT "6 Clicks" Daily Activity  Outcome Measure Difficulty turning over in bed (including adjusting bedclothes, sheets and blankets)?: None Difficulty moving from lying on back to sitting on the side of the bed? : None Difficulty sitting down on and standing up from a chair with arms (e.g., wheelchair, bedside commode, etc,.)?: None Help needed moving to and from a bed to chair (including a wheelchair)?: A Little Help needed walking in hospital room?: A Little Help needed climbing 3-5 steps with a railing? : A Little 6 Click Score: 21    End of Session Equipment Utilized During Treatment: Gait belt Activity Tolerance: Patient tolerated treatment well Patient  left: in chair;with call bell/phone within reach;with family/visitor present Nurse Communication: Mobility status PT Visit Diagnosis: Unsteadiness on feet (R26.81);Other abnormalities of gait and mobility (R26.89);Muscle weakness (generalized) (M62.81)    Time: 7782-4235 PT Time Calculation (min) (ACUTE ONLY): 32 min   Charges:     PT Treatments $Therapeutic Activity: 23-37 mins   PT G Codes:   PT G-Codes **NOT FOR INPATIENT CLASS** Functional Assessment Tool Used: AM-PAC 6 Clicks Basic Mobility Functional Limitation: Mobility: Walking and moving around Mobility: Walking and Moving Around Current Status (T6144): At least 20 percent but less than 40 percent impaired, limited or restricted Mobility: Walking and Moving Around Goal Status [phone]): At least 20 percent but less than 40 percent impaired, limited or restricted Mobility: Walking and Moving Around Discharge Status [phone]): At least 20 percent but less than 40 percent impaired, limited or restricted    9:29 AM, 04/12/17 Lonell Grandchild, MPT Physical Therapist with [facility] 336 [phone] office [phone] mobile phone

## 2017-04-12 NOTE — Progress Notes (Signed)
SLP Cancellation Note  Patient Details Name: [name] MRN: [id number] DOB: 25-Feb-1941   Cancelled treatment:       Reason Eval/Treat Not Completed: SLP screened, no needs identified, will sign off. No indication of communication or cognitive deficits noted per pt and chart review. Will sign off at this time; please re-consult if needs arise.   Kern Reap, MA, CCC-SLP 04/12/2017, 5:15 PM

## 2017-04-12 NOTE — ED Provider Notes (Addendum)
[facility] DEPT Provider Note   CSN: [id number] Arrival date & time: 04/12/17  0630   An emergency department physician performed an initial assessment on this suspected stroke patient at 76.   Time seen 04:50 AM  History   Chief Complaint Chief Complaint  Patient presents with  . Code Stroke    HPI [name] is a 76 y.o. male.  HPI  patient states he was normal when he went to bed about 11 PM. He was able to walk without difficulty. He states he woke up about an hour ago and when he tried to walk his left leg would not support his weight and was giving out on him. He denies noticing any weakness anywhere else. He also states he feels like he has a hangover in his head and states he has a pressure in his head. He denies any headache. He states that he is not having difficulty thinking or difficulty saying his words. He states he's never had anything like this before. He states he actually played golf yesterday without difficulty. Pt is right handed.   PCP Sharilyn Sites, MD   Past Medical History:  Diagnosis Date  . Anemia   . Biliary calculi, common bile duct   . CRI (chronic renal insufficiency)   . DM (diabetes mellitus) ([place])    Type II  . Elevated liver enzymes   . Esophageal varices ([place]) 03/19/11   mrcp  . History of ERCP 03/21/2011   Extrahepatic biliary obstruction secondary to occluded biliary stent and colon no cholelithiasis, status post sphincterotomy, stent removal and replacement and stone extraction.  Marland Kitchen HTN (hypertension)   . Hypothyroidism   . NASH (nonalcoholic steatohepatitis) 2006   liver transplant  . Pancreatitis chronic 03/19/11   mrcp  . S/P colonoscopy with polypectomy 2004   Dr Gala Romney  . S/P endoscopy 03/2004   Hx esophgaeal varices, pyloric channel ulcer prior to transplant  . Squamous cell cancer of external ear    pinna    Patient Active Problem List   Diagnosis Date Noted  . Class 2 obesity due to excess calories with serious  comorbidity and body mass index (BMI) of 37.0 to 37.9 in adult 06/29/2016  . Uncontrolled type 2 diabetes mellitus with complication, with long-term current use of insulin ([place]) 06/15/2015  . Mixed hyperlipidemia 06/15/2015  . Essential hypertension, benign 06/15/2015  . Medial meniscus tear 11/24/2012  . Lateral meniscal tear 11/24/2012  . OA (osteoarthritis) of knee 02/28/2012  . Obstructive hyperbilirubinemia 03/27/2011  . Papular urticaria 03/18/2011  . LFTs abnormal 03/17/2011  . Liver replaced by transplant ([place]) 01/05/2009  . GOUT, UNSPECIFIED 01/04/2009  . RADIATION THERAPY, HX OF 01/04/2009  . Hypothyroidism 12/31/2008  . ANEMIA, NORMOCYTIC 12/31/2008  . LIVER FAILURE, ACUTE 12/31/2008  . Other chronic nonalcoholic liver disease 16/07/930  . RENAL INSUFFICIENCY, CHRONIC 12/31/2008    Past Surgical History:  Procedure Laterality Date  . BILE DUCT STENT PLACEMENT  03/21/11   Dr. Gala Romney  . CHOLECYSTECTOMY  2006  . COLONOSCOPY  08/12/2002   Dr. Gala Romney- somewhat diffusely friable rectum and colon, polyp at 30 cm.  . COLONOSCOPY N/A 09/08/2012   Procedure: COLONOSCOPY;  Surgeon: Daneil Dolin, MD;  Location: [facility];  Service: Endoscopy;  Laterality: N/A;  10:30  . ERCP  03/21/2011   Dr. Gala Romney- ERCP with removal of pediatric feeding tube, endoscopic sphinecterotomy with balloon dilation of ampulla followed by stone extraction and stent placement  . ESOPHAGOGASTRODUODENOSCOPY  03/20/2004  Dr. Nash Mantis 2 esophageal varices o/w normal esophageal mucosa, mild changes of snake skin in the gastric mucosa consistent with portal gastropathy, multiple antral erosions  and  a single pyloric channel ulcer benign appearance o/w normal gastric mucosa, normal D1 and S2  . KNEE ARTHROSCOPY WITH EXCISION PLICA Left 2/67/1245   Procedure: KNEE ARTHROSCOPY WITH EXCISION PLICA;  Surgeon: Carole Civil, MD;  Location: [facility] ORS;  Service: Orthopedics;  Laterality: Left;  . KNEE ARTHROSCOPY  WITH MEDIAL MENISECTOMY Left 11/21/2012   Procedure: KNEE ARTHROSCOPY WITH MEDIAL AND LATERAL MENISECTOMY;  Surgeon: Carole Civil, MD;  Location: [facility] ORS;  Service: Orthopedics;  Laterality: Left;  . LIVER TRANSPLANT  2006   UVA-NASH cirrhosis  . MOUTH SURGERY  may 2012  . MRCP  03/19/11   biliary ductal dilatation- stones. esophageal varices  . SPHINCTEROTOMY  03/21/2011   Procedure: SPHINCTEROTOMY;  Surgeon: Daneil Dolin, MD;  Location: [facility] ORS;  Service: Gastroenterology;;       Home Medications    Prior to Admission medications   Medication Sig Start Date End Date Taking? Authorizing Provider  aspirin 81 MG tablet Take 81 mg by mouth daily.     Yes [provider]  docusate sodium (COLACE) 100 MG capsule Take 100 mg by mouth at bedtime.   Yes [provider]  insulin aspart (NOVOLOG) 100 UNIT/ML FlexPen Inject 12-18 Units into the skin 3 (three) times daily with meals. 09/28/16  Yes Nida, Marella Chimes, MD  Insulin Glargine (LANTUS SOLOSTAR) 100 UNIT/ML Solostar Pen Inject 32 Units into the skin daily at 10 pm. 09/28/16  Yes Nida, Marella Chimes, MD  levothyroxine (SYNTHROID, LEVOTHROID) 137 MCG tablet Take 1 tablet (137 mcg total) by mouth daily before breakfast. 01/28/17  Yes Nida, Marella Chimes, MD  lisinopril (PRINIVIL,ZESTRIL) 20 MG tablet Take 20 mg by mouth daily.  02/24/11  Yes [provider]  Multiple Vitamins-Minerals (MULTIVITAMIN WITH MINERALS) tablet Take 1 tablet by mouth daily.     Yes [provider]  ONE TOUCH ULTRA TEST test strip USE AS DIRECTED TO TEST BLOOD SUGAR 4 TIMES DAILY. 01/11/17  Yes Nida, Marella Chimes, MD  oxyCODONE-acetaminophen (PERCOCET/ROXICET) 5-325 MG per tablet Take 1 tablet by mouth every 4 (four) hours as needed for pain. 10/14/12  Yes Carole Civil, MD  predniSONE (DELTASONE) 10 MG tablet Take 10 mg by mouth as needed.   Yes [provider]  Specialty Vitamins Products (MAGNESIUM, AMINO ACID  CHELATE,) 133 MG tablet Take 1 tablet by mouth 2 (two) times daily. Magnesium 133mg     Yes [provider]  tacrolimus (PROGRAF) 1 MG capsule Take 2-3 mg by mouth 2 (two) times daily. Takes 3 pills every morning and 2 pills at supper   Yes [provider]    Family History Family History  Problem Relation Age of Onset  . Cirrhosis Father        nash  . Lymphoma Mother   . Diabetes Daughter     Social History Social History  Substance Use Topics  . Smoking status: Never Smoker  . Smokeless tobacco: Never Used  . Alcohol use No  lives at home Lives with spouse   Allergies   Insulin glargine and Contrast media [iodinated diagnostic agents]   Review of Systems Review of Systems  All other systems reviewed and are negative.    Physical Exam Updated Vital Signs BP (!) 170/91 (BP Location: Left Arm)   Pulse 70   Temp 97.8 F (  36.6 C) (Oral)   Resp 17   Ht 5' 9.5" (1.765 m)   Wt 117.9 kg (260 lb)   SpO2 94%   BMI 37.84 kg/m   Vital signs normal Except hypertension   Physical Exam  Constitutional: He is oriented to person, place, and time. He appears well-developed and well-nourished.  Non-toxic appearance. He does not appear ill. No distress.  HENT:  Head: Normocephalic and atraumatic.  Right Ear: External ear normal.  Left Ear: External ear normal.  Nose: Nose normal. No mucosal edema or rhinorrhea.  Mouth/Throat: Oropharynx is clear and moist and mucous membranes are normal. No dental abscesses or uvula swelling.  Eyes: Pupils are equal, round, and reactive to light. Conjunctivae and EOM are normal.  Neck: Normal range of motion and full passive range of motion without pain. Neck supple.  Cardiovascular: Normal rate, regular rhythm and normal heart sounds.  Exam reveals no gallop and no friction rub.   No murmur heard. Pulmonary/Chest: Effort normal and breath sounds normal. No respiratory distress. He has no wheezes. He has no rhonchi. He has  no rales. He exhibits no tenderness and no crepitus.  Abdominal: Soft. Normal appearance and bowel sounds are normal. He exhibits no distension. There is no tenderness. There is no rebound and no guarding.  Musculoskeletal: Normal range of motion. He exhibits no edema or tenderness.  Moves all extremities well.   Neurological: He is alert and oriented to person, place, and time. He has normal strength. No cranial nerve deficit.  Patient has mild left pronator drift, he has difficulty holding his left leg up against gravity but he is able to do it.  Skin: Skin is warm, dry and intact. No rash noted. No erythema. No pallor.  Psychiatric: He has a normal mood and affect. His speech is normal and behavior is normal. His mood appears not anxious.  Nursing note and vitals reviewed.    ED Treatments / Results  Labs (all labs ordered are listed, but only abnormal results are displayed) Results for orders placed or performed during the hospital encounter of 04/12/17  Ethanol  Result Value Ref Range   Alcohol, Ethyl (B) <5 <10 mg/dL  Protime-INR  Result Value Ref Range   Prothrombin Time 13.8 11.4 - 15.2 seconds   INR 1.07   APTT  Result Value Ref Range   aPTT 30 24 - 36 seconds  CBC  Result Value Ref Range   WBC 8.7 4.0 - 10.5 K/uL   RBC 4.31 4.22 - 5.81 MIL/uL   Hemoglobin 14.1 13.0 - 17.0 g/dL   HCT 41.9 39.0 - 52.0 %   MCV 97.2 78.0 - 100.0 fL   MCH 32.7 26.0 - 34.0 pg   MCHC 33.7 30.0 - 36.0 g/dL   RDW 13.4 11.5 - 15.5 %   Platelets 199 150 - 400 K/uL  Differential  Result Value Ref Range   Neutrophils Relative % 67 %   Neutro Abs 5.8 1.7 - 7.7 K/uL   Lymphocytes Relative 21 %   Lymphs Abs 1.8 0.7 - 4.0 K/uL   Monocytes Relative 9 %   Monocytes Absolute 0.8 0.1 - 1.0 K/uL   Eosinophils Relative 3 %   Eosinophils Absolute 0.3 0.0 - 0.7 K/uL   Basophils Relative 0 %   Basophils Absolute 0.0 0.0 - 0.1 K/uL  Comprehensive metabolic panel  Result Value Ref Range   Sodium 141  135 - 145 mmol/L   Potassium 4.2 3.5 - 5.1 mmol/L  Chloride 108 101 - 111 mmol/L   CO2 26 22 - 32 mmol/L   Glucose, Bld 142 (H) 65 - 99 mg/dL   BUN 29 (H) 6 - 20 mg/dL   Creatinine, Ser 1.31 (H) 0.61 - 1.24 mg/dL   Calcium 9.0 8.9 - 10.3 mg/dL   Total Protein 7.3 6.5 - 8.1 g/dL   Albumin 3.6 3.5 - 5.0 g/dL   AST 19 15 - 41 U/L   ALT 18 17 - 63 U/L   Alkaline Phosphatase 66 38 - 126 U/L   Total Bilirubin 1.0 0.3 - 1.2 mg/dL   GFR calc non Af Amer 51 (L) >60 mL/min   GFR calc Af Amer 59 (L) >60 mL/min   Anion gap 7 5 - 15  Troponin I  Result Value Ref Range   Troponin I <0.03 <0.03 ng/mL  I-stat chem 8, ed  Result Value Ref Range   Sodium 143 135 - 145 mmol/L   Potassium 4.2 3.5 - 5.1 mmol/L   Chloride 105 101 - 111 mmol/L   BUN 30 (H) 6 - 20 mg/dL   Creatinine, Ser 1.40 (H) 0.61 - 1.24 mg/dL   Glucose, Bld 141 (H) 65 - 99 mg/dL   Calcium, Ion 1.25 1.15 - 1.40 mmol/L   TCO2 26 22 - 32 mmol/L   Hemoglobin 14.6 13.0 - 17.0 g/dL   HCT 43.0 39.0 - 52.0 %   Laboratory interpretation all normal except Hyperglycemia, renal insufficiency    EKG  EKG Interpretation  Date/Time:  Friday April 12 2017 05:11:26 EDT Ventricular Rate:  70 PR Interval:    QRS Duration: 90 QT Interval:  384 QTC Calculation: 415 R Axis:   11 Text Interpretation:  Sinus rhythm Low voltage, precordial leads Posterior infarct, old Baseline wander in lead(s) V5 No significant change since last tracing 17 Nov 2012 Confirmed by Rolland Porter [phone]) on 04/12/2017 5:48:47 AM       Radiology Ct Head Code Stroke Wo Contrast  Result Date: 04/12/2017 CLINICAL DATA:  Code stroke. Acute onset LEFT-sided weakness. History of hypertension, diabetes. EXAM: CT HEAD WITHOUT CONTRAST TECHNIQUE: Contiguous axial images were obtained from the base of the skull through the vertex without intravenous contrast. COMPARISON:  CT HEAD November 03, 2004 FINDINGS: BRAIN: No intraparenchymal hemorrhage, mass effect nor midline  shift. The ventricles and sulci are normal for age. Patchy supratentorial white matter hypodensities within normal range for patient's age, though non-specific are most compatible with chronic small vessel ischemic disease. Old LEFT basal ganglia and LEFT thalamus lacunar infarcts. No acute large vascular territory infarcts. No abnormal extra-axial fluid collections. Basal cisterns are patent. VASCULAR: Moderate to severe calcific atherosclerosis of the carotid siphons. Tiny densities along bilateral middle cerebral artery's compatible with atherosclerosis. SKULL: No skull fracture. No significant scalp soft tissue swelling. SINUSES/ORBITS: The mastoid air-cells and included paranasal sinuses are well-aerated.The included ocular globes and orbital contents are non-suspicious. OTHER: None. ASPECTS [facility] Stroke Program Early CT Score) - Ganglionic level infarction (caudate, lentiform nuclei, internal capsule, insula, M1-M3 cortex): 7 - Supraganglionic infarction (M4-M6 cortex): 3 Total score (0-10 with 10 being normal): 10 IMPRESSION: 1. Negative noncontrast CT HEAD for age. 2. ASPECTS is 10. 3. Critical Value/emergent results were called by telephone at the time of interpretation on 04/12/2017 at 5:10 am to Dr. Rolland Porter , who verbally acknowledged these results. Electronically Signed   By: Elon Alas M.D.   On: 04/12/2017 05:11    Procedures .Critical Care Performed by: Tomi Bamberger, Colston Pyle  Authorized by: Rolland Porter   Critical care provider statement:    Critical care time (minutes):  31   Critical care was necessary to treat or prevent imminent or life-threatening deterioration of the following conditions:  CNS failure or compromise   Critical care was time spent personally by me on the following activities:  Discussions with consultants, examination of patient, ordering and review of radiographic studies, ordering and review of laboratory studies and review of old charts     (including critical care  time)  Medications Ordered in ED Medications - No data to display   Initial Impression / Assessment and Plan / ED Course  I have reviewed the triage vital signs and the nursing notes.  Pertinent labs & imaging results that were available during my care of the patient were reviewed by me and considered in my medical decision making (see chart for details).    Code stroke was called at 4:57 AM.  5:10 AM radiologist called his CT head report which was without acute findings.  5:25 AM specialist on-call neurologist, Dr. Sandria Manly, has examined patient and reviewed his CT. He states he suspect patient has had a lacunar infarct in [place] posterior capsule. He states patient is outside the window for TPA. He states he needs to be admitted for stroke evaluation and MRI. Patient is already on aspirin. He states to add Plavix and atorvastatin.  05:57 AM Dr Myna Hidalgo, hospitalist, will admit  06:00 AM patient and family informed of his test results and recommendations of the neurologist.  Pt is agreeable to being admitted. He was advised if it happened again he should call 911 right away. He states actually her first time he noticed it was when he got up to go the bathroom at 2:30 AM.  Final Clinical Impressions(s) / ED Diagnoses   Final diagnoses:  Left-sided weakness  Acute ischemic stroke [facility])    Plan admission  Rolland Porter, MD, Barbette Or, MD 04/12/17 [place], [place], MD 04/12/17 [phone]

## 2017-04-12 NOTE — ED Triage Notes (Signed)
Pt states he went to bed approx 2300 last night, awoke approx 30 minutes ago to go to the bathroom and states he "couldn't operate my left leg"   Pt aalso states he felt a little dizzy at the time.

## 2017-04-13 DIAGNOSIS — N182 Chronic kidney disease, stage 2 (mild): Secondary | ICD-10-CM

## 2017-04-13 DIAGNOSIS — I10 Essential (primary) hypertension: Secondary | ICD-10-CM

## 2017-04-13 DIAGNOSIS — E1165 Type 2 diabetes mellitus with hyperglycemia: Secondary | ICD-10-CM | POA: Diagnosis not present

## 2017-04-13 DIAGNOSIS — E039 Hypothyroidism, unspecified: Secondary | ICD-10-CM

## 2017-04-13 DIAGNOSIS — R29898 Other symptoms and signs involving the musculoskeletal system: Secondary | ICD-10-CM

## 2017-04-13 LAB — HEMOGLOBIN A1C
HEMOGLOBIN A1C: 6.8 % — AB (ref 4.8–5.6)
MEAN PLASMA GLUCOSE: 148.46 mg/dL

## 2017-04-13 LAB — GLUCOSE, CAPILLARY
GLUCOSE-CAPILLARY: 109 mg/dL — AB (ref 65–99)
GLUCOSE-CAPILLARY: 136 mg/dL — AB (ref 65–99)
GLUCOSE-CAPILLARY: 137 mg/dL — AB (ref 65–99)
Glucose-Capillary: 123 mg/dL — ABNORMAL HIGH (ref 65–99)

## 2017-04-13 LAB — LIPID PANEL
CHOLESTEROL: 121 mg/dL (ref 0–200)
HDL: 28 mg/dL — ABNORMAL LOW (ref >40)
LDL Cholesterol: 72 mg/dL (ref 0–99)
Total CHOL/HDL Ratio: 4.3 RATIO
Triglycerides: 107 mg/dL (ref <150)
VLDL: 21 mg/dL (ref 0–40)

## 2017-04-13 MED ORDER — SODIUM CHLORIDE 0.9 % IV SOLN
INTRAVENOUS | Status: DC
  Administered 2017-04-13 – 2017-04-14 (×2): via INTRAVENOUS

## 2017-04-13 NOTE — Plan of Care (Signed)
Problem: Coping: Goal: Ability to verbalize positive feelings about self will improve Outcome: Progressing Encouraged patient and family present at the bedside to discuss any concerns about stroke and future care.

## 2017-04-13 NOTE — Plan of Care (Signed)
Problem: Education: Goal: Knowledge of disease or condition will improve Outcome: Progressing Patient verbalizes need for increased safety and assistance r/t left side weakness.

## 2017-04-13 NOTE — Progress Notes (Signed)
Physical Therapy Treatment Patient Details Name: [name] MRN: [id number] DOB: June 11, 1941 Today's Date: 04/13/2017    History of Present Illness  [name] is a 76 y.o. male with medical history significant for cirrhosis status post liver transplantation, insulin-dependent diabetes mellitus, hypertension, chronic kidney disease stage II, and hypothyroidism, now presenting to the emergency department with acute left leg weakness. Patient reports that he was in his usual state of health, had an uneventful day and played golf yesterday, and went to bed in his usual state approximately 11 PM. Patient reports waking at approximately 3 AM to urinate and had difficulty using his left leg at that time. He also reports some slight dizziness    PT Comments    Pt is up to walk with PT and noted his assistance needs that required him to have continual contact from PT to be safe.  He is appropriate for SNF as he is not getting increased control of standing and L side gives out during gait and balance skills.  He will continue acutely and talked with family about his SNF request depending on insurance coverage of the need.  Even with his unsafe WB on LLE he may not be approved, but is a high fall risk.   Follow Up Recommendations  SNF     Equipment Recommendations  None recommended by PT    Recommendations for Other Services       Precautions / Restrictions Precautions Precautions: Fall Precaution Comments: Pt is impulsive on side of bed, needing continual reminders not to try to stand Restrictions Weight Bearing Restrictions: No    Mobility  Bed Mobility Overal bed mobility: Needs Assistance Bed Mobility: Supine to Sit     Supine to sit: Min guard;Min assist     General bed mobility comments: siderail and needs assist to pull up to sit from sidelying  Transfers Overall transfer level: Needs assistance Equipment used: 1 person hand held assist;Rolling walker (2  wheeled) Transfers: Sit to/from Stand Sit to Stand: Min guard;Min assist Stand pivot transfers: Min guard       General transfer comment:  cued correct hand placement every trial  Ambulation/Gait Ambulation/Gait assistance: Min guard Ambulation Distance (Feet): 85 Feet Assistive device: Rolling walker (2 wheeled) Gait Pattern/deviations: Decreased step length - left;Decreased stance time - left;Decreased stride length Gait velocity: reduced Gait velocity interpretation: Below normal speed for age/gender General Gait Details: L knee is giving a bit and began to drag L foot at end of walk   Stairs            Wheelchair Mobility    Modified Rankin (Stroke Patients Only) Modified Rankin (Stroke Patients Only) Pre-Morbid Rankin Score: Slight disability Modified Rankin: Moderately severe disability     Balance Overall balance assessment: Needs assistance Sitting-balance support: Feet supported Sitting balance-Leahy Scale: Good     Standing balance support: Bilateral upper extremity supported;During functional activity Standing balance-Leahy Scale: Fair Standing balance comment: holding RW for entire time standing                            Cognition Arousal/Alertness: Awake/alert Behavior During Therapy: WFL for tasks assessed/performed Overall Cognitive Status: Within Functional Limits for tasks assessed                                        Exercises General Exercises -  Lower Extremity Ankle Circles/Pumps: AROM;AAROM;Both;5 reps Quad Sets: AROM;Both;10 reps Heel Slides: AAROM;AROM;Both;10 reps Hip ABduction/ADduction: AROM;AAROM;Both;10 reps    General Comments        Pertinent Vitals/Pain Pain Assessment: No/denies pain    Home Living                      Prior Function            PT Goals (current goals can now be found in the care plan section) Acute Rehab PT Goals Patient Stated Goal: home with  family Progress towards PT goals: Progressing toward goals;Not progressing toward goals - comment (increased gait but also increased assistance needed)    Frequency    7X/week      PT Plan Discharge plan needs to be updated    Co-evaluation              AM-PAC PT "6 Clicks" Daily Activity  Outcome Measure  Difficulty turning over in bed (including adjusting bedclothes, sheets and blankets)?: A Little Difficulty moving from lying on back to sitting on the side of the bed? : Unable Difficulty sitting down on and standing up from a chair with arms (e.g., wheelchair, bedside commode, etc,.)?: Unable Help needed moving to and from a bed to chair (including a wheelchair)?: A Little Help needed walking in hospital room?: A Little Help needed climbing 3-5 steps with a railing? : Total 6 Click Score: 12    End of Session Equipment Utilized During Treatment: Gait belt Activity Tolerance: Patient tolerated treatment well Patient left: in chair;with call bell/phone within reach;with family/visitor present Nurse Communication: Mobility status PT Visit Diagnosis: Unsteadiness on feet (R26.81);Other abnormalities of gait and mobility (R26.89);Muscle weakness (generalized) (M62.81)     Time: 6767-2094 PT Time Calculation (min) (ACUTE ONLY): 32 min  Charges:  $Gait Training: 8-22 mins $Therapeutic Exercise: 8-22 mins                    G Codes:  Functional Assessment Tool Used: AM-PAC 6 Clicks Basic Mobility     Ramond Dial 04/13/2017, 5:44 PM   Mee Hives, PT MS Acute Rehab Dept. Number: [facility]

## 2017-04-13 NOTE — Progress Notes (Signed)
[facility]  PROGRESS NOTE  KELSEY EDMAN ZYS:[id number] DOB: 1941/04/22 DOA: 04/12/2017 PCP: Sharilyn Sites, MD   Brief HPI:   76 year old male with a history of liver cirrhosis status post liver transplantation, diabetes mellitus, hypertension CK D stage II hypothyroidism came with acute left leg weakness. MRI brain showed 1 cm acute infarct in the right corona radiata    Subjective   Patient seen and examined, he feels better today. Was seen by a neurologist yesterday, and he recommended evaluation for physical therapy at rehabilitation.   Assessment/Plan:     1. Left-sided weakness-MRI brain showed lacunar infarct on the contralateral side. Patient was evaluated by neurology aspirin has been increased to 325 mg by mouth daily. His factor modification including control of blood pressure, diabetes mellitus and dyslipidemia. 2. Diabetes mellitus-patient's 11 A1c was 7.4 in May 2018. Management at home with Lantus 32 units subcutaneous daily and NovoLog 12-18 units 3 times a day. In the hospital Lantus has been changed to 30 mg daily at bedtime with NovoLog 6 units 3 times a day, sliding scale insulin. 3. Hypertension- patient is on lisinopril 20 mg daily at home. Currently it is on hold for permissive hypertension. Will restart his medication at the time of discharge. 4. CK D stage II- serum creatinine 1.31 on admission, today creatinine is 1.40. Will start gentle IV hydration with normal saline. 5. Liver transplant recipient- stable, continue Prograf 6. Hypothyroidism-continue Synthroid    DVT prophylaxis: Lovenox  Code Status: Full code  Family Communication: Discussed with patient's daughter at bedside   Disposition Plan: To be decided based on physical therapy evaluation   Consultants:  Neurology  Procedures:  Echocardiogram  Carotid ultrasound  Continuous infusions     Antibiotics:   Anti-infectives    None       Objective   Vitals:   04/12/17  2100 04/13/17 0100 04/13/17 0500 04/13/17 0900  BP: (!) 146/72 (!) 142/67 (!) 174/80 (!) 157/87  Pulse: 70 67 83 73  Resp: 18 16 20 18   Temp: 97.9 F (36.6 C) 98.8 F (37.1 C) 98.3 F (36.8 C) 97.7 F (36.5 C)  TempSrc: Oral Oral Oral Oral  SpO2: 99% 96% 95% 96%  Weight:      Height:        Intake/Output Summary (Last 24 hours) at 04/13/17 1223 Last data filed at 04/13/17 1151  Gross per 24 hour  Intake              360 ml  Output             1000 ml  Net             -640 ml   Filed Weights   04/12/17 0514  Weight: 117.9 kg (260 lb)     Physical Examination:   Physical Exam: Eyes: No icterus, extraocular muscles intact  Mouth: Oral mucosa is moist, no lesions on palate,  Neck: Supple, no deformities, masses, or tenderness Lungs: Normal respiratory effort, bilateral clear to auscultation, no crackles or wheezes.  Heart: Regular rate and rhythm, S1 and S2 normal, no murmurs, rubs auscultated Abdomen: BS normoactive,soft,nondistended,non-tender to palpation,no organomegaly Extremities: No pretibial edema, no erythema, no cyanosis, no clubbing Neuro : Alert and oriented to time, place and person, No focal deficits  Skin: No rashes seen on exam     Data Reviewed: I have personally reviewed following labs and imaging studies  CBG:  Recent Labs Lab 04/12/17 1119 04/12/17 1640 04/12/17 2105 04/13/17  0722 04/13/17 1154  GLUCAP 158* 119* 109* 109* 123*    CBC:  Recent Labs Lab 04/12/17 0450 04/12/17 0507  WBC 8.7  --   NEUTROABS 5.8  --   HGB 14.1 14.6  HCT 41.9 43.0  MCV 97.2  --   PLT 199  --     Basic Metabolic Panel:  Recent Labs Lab 04/12/17 0450 04/12/17 0507  NA 141 143  K 4.2 4.2  CL 108 105  CO2 26  --   GLUCOSE 142* 141*  BUN 29* 30*  CREATININE 1.31* 1.40*  CALCIUM 9.0  --     No results found for this or any previous visit (from the past 240 hour(s)).   Liver Function Tests:  Recent Labs Lab 04/12/17 0450  AST 19  ALT  18  ALKPHOS 66  BILITOT 1.0  PROT 7.3  ALBUMIN 3.6   No results for input(s): LIPASE, AMYLASE in the last 168 hours. No results for input(s): AMMONIA in the last 168 hours.  Cardiac Enzymes:  Recent Labs Lab 04/12/17 0450  TROPONINI <0.03   BNP (last 3 results) No results for input(s): BNP in the last 8760 hours.  ProBNP (last 3 results) No results for input(s): PROBNP in the last 8760 hours.    Studies: Mr Brain Wo Contrast  Result Date: 04/12/2017 CLINICAL DATA:  Ataxia, stroke suspected. EXAM: MRI HEAD WITHOUT CONTRAST MRA HEAD WITHOUT CONTRAST TECHNIQUE: Multiplanar, multiecho pulse sequences of the brain and surrounding structures were obtained without intravenous contrast. Angiographic images of the head were obtained using MRA technique without contrast. COMPARISON:  Head CT from earlier today FINDINGS: MRI HEAD FINDINGS Brain: 1 cm focus of restricted diffusion in the posterior right corona radiata. Overall mild chronic small vessel ischemic change in the cerebral white matter. There has been a remote small vessel infarct in the left corona radiata. Age normal brain volume. No mass, hemorrhage, or hydrocephalus. Vascular: Arterial findings below. Normal dural venous sinus flow voids. Skull and upper cervical spine: Negative for marrow lesion. Sinuses/Orbits: Negative MRA HEAD FINDINGS Motion degraded. No branch occlusion or flow limiting stenosis seen in the anterior circulation. There is mild narrowing of the right ICA at the anterior genu. Codominant vertebral arteries. Mild left V4 segment narrowing. There is a high-grade proximal right P2 segment stenosis with less intense downstream flow. No flow seen beyond the mid left P2 segment. Negative for aneurysm. IMPRESSION: Brain MRI: 1. 1 cm acute infarct in the right corona radiata. 2. Mild chronic small vessel ischemia in the cerebral white matter. Intracranial MRA: 1. Motion degraded. 2. Intracranial atherosclerosis with  high-grade proximal right and mid left P2 segment stenoses. 3. Mild left V4 segment narrowing. Electronically Signed   By: Monte Fantasia M.D.   On: 04/12/2017 07:22   US Carotid Bilateral (at [facility] And [facility] Only)  Result Date: 04/12/2017 CLINICAL DATA:  76 year old male with a history of CVA. Cardiovascular risk factors include hypertension, known prior stroke/TIA, hyperlipidemia, diabetes EXAM: BILATERAL CAROTID DUPLEX ULTRASOUND TECHNIQUE: Pearline Cables scale imaging, color Doppler and duplex ultrasound were performed of bilateral carotid and vertebral arteries in the neck. COMPARISON:  No prior duplex FINDINGS: Criteria: Quantification of carotid stenosis is based on velocity parameters that correlate the residual internal carotid diameter with NASCET-based stenosis levels, using the diameter of the distal internal carotid lumen as the denominator for stenosis measurement. The following velocity measurements were obtained: RIGHT ICA:  Systolic 61 cm/sec, Diastolic 25 cm/sec CCA:  87 cm/sec SYSTOLIC ICA/CCA RATIO:  0.7 ECA:  80 set cm/sec LEFT ICA:  Systolic 55 cm/sec, Diastolic 11 cm/sec CCA:  94 cm/sec SYSTOLIC ICA/CCA RATIO:  0.6 ECA:  133 cm/sec Right Brachial SBP: Not acquired Left Brachial SBP: Not acquired RIGHT CAROTID ARTERY: No significant calcifications of the right common carotid artery. Intermediate waveform maintained. Heterogeneous and partially calcified plaque at the right carotid bifurcation. No significant lumen shadowing. Low resistance waveform of the right ICA. Tortuosity RIGHT VERTEBRAL ARTERY: Antegrade flow with low resistance waveform. LEFT CAROTID ARTERY: No significant calcifications of the left common carotid artery. Intermediate waveform maintained. Heterogeneous and partially calcified plaque at the left carotid bifurcation without significant lumen shadowing. Low resistance waveform of the left ICA. tortuosity. LEFT VERTEBRAL ARTERY:  Antegrade flow with low resistance waveform.  IMPRESSION: Color duplex indicates minimal heterogeneous and calcified plaque, with no hemodynamically significant stenosis by duplex criteria in the extracranial cerebrovascular circulation. Signed, Dulcy Fanny. Earleen Newport, DO [facility] Electronically Signed   By: Corrie Mckusick D.O.   On: 04/12/2017 10:57   Mr Jodene Nam Head/brain DD Cm  Result Date: 04/12/2017 CLINICAL DATA:  Ataxia, stroke suspected. EXAM: MRI HEAD WITHOUT CONTRAST MRA HEAD WITHOUT CONTRAST TECHNIQUE: Multiplanar, multiecho pulse sequences of the brain and surrounding structures were obtained without intravenous contrast. Angiographic images of the head were obtained using MRA technique without contrast. COMPARISON:  Head CT from earlier today FINDINGS: MRI HEAD FINDINGS Brain: 1 cm focus of restricted diffusion in the posterior right corona radiata. Overall mild chronic small vessel ischemic change in the cerebral white matter. There has been a remote small vessel infarct in the left corona radiata. Age normal brain volume. No mass, hemorrhage, or hydrocephalus. Vascular: Arterial findings below. Normal dural venous sinus flow voids. Skull and upper cervical spine: Negative for marrow lesion. Sinuses/Orbits: Negative MRA HEAD FINDINGS Motion degraded. No branch occlusion or flow limiting stenosis seen in the anterior circulation. There is mild narrowing of the right ICA at the anterior genu. Codominant vertebral arteries. Mild left V4 segment narrowing. There is a high-grade proximal right P2 segment stenosis with less intense downstream flow. No flow seen beyond the mid left P2 segment. Negative for aneurysm. IMPRESSION: Brain MRI: 1. 1 cm acute infarct in the right corona radiata. 2. Mild chronic small vessel ischemia in the cerebral white matter. Intracranial MRA: 1. Motion degraded. 2. Intracranial atherosclerosis with high-grade proximal right and mid left P2 segment stenoses. 3. Mild left  V4 segment narrowing. Electronically Signed   By: Monte Fantasia M.D.   On: 04/12/2017 07:22   Ct Head Code Stroke Wo Contrast  Result Date: 04/12/2017 CLINICAL DATA:  Code stroke. Acute onset LEFT-sided weakness. History of hypertension, diabetes. EXAM: CT HEAD WITHOUT CONTRAST TECHNIQUE: Contiguous axial images were obtained from the base of the skull through the vertex without intravenous contrast. COMPARISON:  CT HEAD November 03, 2004 FINDINGS: BRAIN: No intraparenchymal hemorrhage, mass effect nor midline shift. The ventricles and sulci are normal for age. Patchy supratentorial white matter hypodensities within normal range for patient's age, though non-specific are most compatible with chronic small vessel ischemic disease. Old LEFT basal ganglia and LEFT thalamus lacunar infarcts. No acute large vascular territory infarcts. No abnormal extra-axial fluid collections. Basal cisterns are patent. VASCULAR: Moderate to severe calcific atherosclerosis of the carotid siphons. Tiny densities along bilateral middle cerebral artery's compatible with atherosclerosis. SKULL: No skull fracture. No significant scalp soft tissue swelling. SINUSES/ORBITS: The mastoid air-cells and included paranasal sinuses are well-aerated.The included ocular  globes and orbital contents are non-suspicious. OTHER: None. ASPECTS [facility] Stroke Program Early CT Score) - Ganglionic level infarction (caudate, lentiform nuclei, internal capsule, insula, M1-M3 cortex): 7 - Supraganglionic infarction (M4-M6 cortex): 3 Total score (0-10 with 10 being normal): 10 IMPRESSION: 1. Negative noncontrast CT HEAD for age. 2. ASPECTS is 10. 3. Critical Value/emergent results were called by telephone at the time of interpretation on 04/12/2017 at 5:10 am to Dr. Rolland Porter , who verbally acknowledged these results. Electronically Signed   By: Elon Alas M.D.   On: 04/12/2017 05:11    Scheduled Meds: . aspirin EC  81 mg Oral Daily  . docusate  sodium  100 mg Oral QHS  . enoxaparin (LOVENOX) injection  40 mg Subcutaneous Q24H  . insulin aspart  0-5 Units Subcutaneous QHS  . insulin aspart  0-9 Units Subcutaneous TID WC  . insulin aspart  6 Units Subcutaneous TID WC  . insulin glargine  30 Units Subcutaneous QHS  . levothyroxine  137 mcg Oral QAC breakfast  . multivitamin with minerals  1 tablet Oral Daily  . tacrolimus  1 mg Oral BID WC      Time spent: 30 min  [facility] Pager [phone]. If 7PM-7AM, please contact night-coverage at [email or web address], Office  [phone]  password [place]  04/13/2017, 12:23 PM  LOS: 1 day

## 2017-04-14 LAB — BASIC METABOLIC PANEL
Anion gap: 6 (ref 5–15)
BUN: 22 mg/dL — AB (ref 6–20)
CALCIUM: 8.5 mg/dL — AB (ref 8.9–10.3)
CHLORIDE: 110 mmol/L (ref 101–111)
CO2: 24 mmol/L (ref 22–32)
CREATININE: 1.04 mg/dL (ref 0.61–1.24)
GFR calc non Af Amer: >60 mL/min (ref >60)
GLUCOSE: 115 mg/dL — AB (ref 65–99)
Potassium: 3.9 mmol/L (ref 3.5–5.1)
Sodium: 140 mmol/L (ref 135–145)

## 2017-04-14 LAB — GLUCOSE, CAPILLARY
GLUCOSE-CAPILLARY: 104 mg/dL — AB (ref 65–99)
Glucose-Capillary: 124 mg/dL — ABNORMAL HIGH (ref 65–99)
Glucose-Capillary: 177 mg/dL — ABNORMAL HIGH (ref 65–99)
Glucose-Capillary: 152 mg/dL — ABNORMAL HIGH (ref 65–99)

## 2017-04-14 MED ORDER — LISINOPRIL 10 MG PO TABS
20.0000 mg | ORAL_TABLET | Freq: Every day | ORAL | Status: DC
  Administered 2017-04-14 – 2017-04-16 (×3): 20 mg via ORAL
  Filled 2017-04-14 (×4): qty 2

## 2017-04-14 NOTE — Progress Notes (Addendum)
[facility]  PROGRESS NOTE  [name] IEP:[id number] DOB: 1941-06-25 DOA: 04/12/2017 PCP: Sharilyn Sites, MD   Brief HPI:   76 year old male with a history of liver cirrhosis status post liver transplantation, diabetes mellitus, hypertension CK D stage II hypothyroidism came with acute left leg weakness. MRI brain showed 1 cm acute infarct in the right corona radiata    Subjective   Patient seen and examined, denies any complaints. He was re- evaluated by physical therapy yesterday, and recommended skilled nursing facility for rehabilitation.   Assessment/Plan:     1. Left-sided weakness-MRI brain showed lacunar infarct on the contralateral side. Patient was evaluated by neurology aspirin has been increased to 325 mg by mouth daily. His factor modification including control of blood pressure, diabetes mellitus and dyslipidemia. 2. Diabetes mellitus-patient's  A1c was 7.4 in May 2018. Management at home with Lantus 32 units subcutaneous daily and NovoLog 12-18 units 3 times a day. In the hospital Lantus has been changed to 30 mg daily at bedtime with NovoLog 6 units 3 times a day, sliding scale insulin.Blood glucose is well controlled. 3. Hypertension- patient is on lisinopril 20 mg daily at home. Blood pressure is now elevated, will restart lisinopril 20 mg by mouth daily 4. CKD stage II- serum creatinine 1.31 on admission, today creatinine is 1.05.  Will change IV fluids to [facility]. 5. Hypothyroidism-continue Synthroid    DVT prophylaxis: Lovenox  Code Status: Full code  Family Communication: Discussed with patient's daughter at bedside on 04/13/2017  Disposition Plan: SNF   Consultants:  Neurology  Procedures:  Echocardiogram  Carotid ultrasound  Continuous infusions . sodium chloride 0.9 % 1,000 mL infusion 75 mL/hr at 04/14/17 0124      Antibiotics:   Anti-infectives    None       Objective   Vitals:   04/13/17 1700 04/13/17 2100 04/14/17 0100  04/14/17 0500  BP: (!) 178/79 (!) 154/74 135/60 (!) 117/59  Pulse: 76 72 66 69  Resp: 20 20 20 20   Temp: 98 F (36.7 C) (!) 96.4 F (35.8 C) (!) 96.6 F (35.9 C) (!) 96.8 F (36 C)  TempSrc: Oral Oral Oral Oral  SpO2: 97% 97% 97% 96%  Weight:      Height:        Intake/Output Summary (Last 24 hours) at 04/14/17 1022 Last data filed at 04/14/17 0945  Gross per 24 hour  Intake          1053.75 ml  Output             2200 ml  Net         -1146.25 ml   Filed Weights   04/12/17 0514  Weight: 117.9 kg (260 lb)     Physical Examination:   Physical Exam: Physical Exam: Eyes: No icterus, extraocular muscles intact  Mouth: Oral mucosa is moist, no lesions on palate,  Neck: Supple, no deformities, masses, or tenderness Lungs: Normal respiratory effort, bilateral clear to auscultation, no crackles or wheezes.  Heart: Regular rate and rhythm, S1 and S2 normal, no murmurs, rubs auscultated Abdomen: BS normoactive,soft,nondistended,non-tender to palpation,no organomegaly Extremities: No pretibial edema, no erythema, no cyanosis, no clubbing Neuro : Alert and oriented to time, place and person, No focal deficits Skin: No rashes seen on exam    Data Reviewed: I have personally reviewed following labs and imaging studies  CBG:  Recent Labs Lab 04/13/17 0722 04/13/17 1154 04/13/17 1625 04/13/17 2047 04/14/17 0733  GLUCAP 109* 123* 136*  137* 124*    CBC:  Recent Labs Lab 04/12/17 0450 04/12/17 0507  WBC 8.7  --   NEUTROABS 5.8  --   HGB 14.1 14.6  HCT 41.9 43.0  MCV 97.2  --   PLT 199  --     Basic Metabolic Panel:  Recent Labs Lab 04/12/17 0450 04/12/17 0507 04/14/17 0434  NA 141 143 140  K 4.2 4.2 3.9  CL 108 105 110  CO2 26  --  24  GLUCOSE 142* 141* 115*  BUN 29* 30* 22*  CREATININE 1.31* 1.40* 1.04  CALCIUM 9.0  --  8.5*    No results found for this or any previous visit (from the past 240 hour(s)).   Liver Function Tests:  Recent  Labs Lab 04/12/17 0450  AST 19  ALT 18  ALKPHOS 66  BILITOT 1.0  PROT 7.3  ALBUMIN 3.6   No results for input(s): LIPASE, AMYLASE in the last 168 hours. No results for input(s): AMMONIA in the last 168 hours.  Cardiac Enzymes:  Recent Labs Lab 04/12/17 0450  TROPONINI <0.03      Studies: US Carotid Bilateral (at [facility] Only)  Result Date: 04/12/2017 CLINICAL DATA:  76 year old male with a history of CVA. Cardiovascular risk factors include hypertension, known prior stroke/TIA, hyperlipidemia, diabetes EXAM: BILATERAL CAROTID DUPLEX ULTRASOUND TECHNIQUE: Pearline Cables scale imaging, color Doppler and duplex ultrasound were performed of bilateral carotid and vertebral arteries in the neck. COMPARISON:  No prior duplex FINDINGS: Criteria: Quantification of carotid stenosis is based on velocity parameters that correlate the residual internal carotid diameter with NASCET-based stenosis levels, using the diameter of the distal internal carotid lumen as the denominator for stenosis measurement. The following velocity measurements were obtained: RIGHT ICA:  Systolic 61 cm/sec, Diastolic 25 cm/sec CCA:  87 cm/sec SYSTOLIC ICA/CCA RATIO:  0.7 ECA:  80 set cm/sec LEFT ICA:  Systolic 55 cm/sec, Diastolic 11 cm/sec CCA:  94 cm/sec SYSTOLIC ICA/CCA RATIO:  0.6 ECA:  133 cm/sec Right Brachial SBP: Not acquired Left Brachial SBP: Not acquired RIGHT CAROTID ARTERY: No significant calcifications of the right common carotid artery. Intermediate waveform maintained. Heterogeneous and partially calcified plaque at the right carotid bifurcation. No significant lumen shadowing. Low resistance waveform of the right ICA. Tortuosity RIGHT VERTEBRAL ARTERY: Antegrade flow with low resistance waveform. LEFT CAROTID ARTERY: No significant calcifications of the left common carotid artery. Intermediate waveform maintained. Heterogeneous and partially calcified plaque at the left carotid bifurcation without significant lumen  shadowing. Low resistance waveform of the left ICA. tortuosity. LEFT VERTEBRAL ARTERY:  Antegrade flow with low resistance waveform. IMPRESSION: Color duplex indicates minimal heterogeneous and calcified plaque, with no hemodynamically significant stenosis by duplex criteria in the extracranial cerebrovascular circulation. Signed, Dulcy Fanny. Earleen Newport, DO [facility] Electronically Signed   By: Corrie Mckusick D.O.   On: 04/12/2017 10:57    Scheduled Meds: . aspirin EC  81 mg Oral Daily  . docusate sodium  100 mg Oral QHS  . enoxaparin (LOVENOX) injection  40 mg Subcutaneous Q24H  . insulin aspart  0-5 Units Subcutaneous QHS  . insulin aspart  0-9 Units Subcutaneous TID WC  . insulin aspart  6 Units Subcutaneous TID WC  . insulin glargine  30 Units Subcutaneous QHS  . levothyroxine  137 mcg Oral QAC breakfast  . lisinopril  20 mg Oral Daily  . multivitamin with minerals  1 tablet Oral Daily  . tacrolimus  1 mg Oral  BID WC      Time spent: 30 min  [facility] Pager [phone]. If 7PM-7AM, please contact night-coverage at [email or web address], Office  [phone]  password [email or web address]  04/14/2017, 10:22 AM  LOS: 2 days

## 2017-04-14 NOTE — Progress Notes (Signed)
04/14/17 1800  PT Visit Information  Last PT Received On 04/14/17  Assistance Needed +1  History of Present Illness [name] is a 76 y.o. male with medical history significant for cirrhosis status post liver transplantation, insulin-dependent diabetes mellitus, hypertension, chronic kidney disease stage II, and hypothyroidism, now presenting to the emergency department with acute left leg weakness. Patient reports that he was in his usual state of health, had an uneventful day and played golf yesterday, and went to bed in his usual state approximately 11 PM. Patient reports waking at approximately 3 AM to urinate and had difficulty using his left leg at that time. He also reports some slight dizziness  Subjective Data  Subjective Feeling better today and hoping to leave soon  Patient Stated Goal home with family  Precautions  Precautions Fall  Precaution Comments more controlled with gait but loses grip on L hand with walker as he fatigues  Restrictions  Weight Bearing Restrictions No  Pain Assessment  Pain Assessment No/denies pain  Cognition  Arousal/Alertness Awake/alert  Behavior During Therapy WFL for tasks assessed/performed  Overall Cognitive Status Within Functional Limits for tasks assessed  Bed Mobility  General bed mobility comments up when PT entered  Transfers  Overall transfer level Needs assistance  Equipment used 1 person hand held assist;Rolling walker (2 wheeled)  Transfers Sit to/from Stand  Sit to Stand Min guard  General transfer comment reminders for  hand placement  Ambulation/Gait  Ambulation/Gait assistance Min guard  Ambulation Distance (Feet) 70 Feet  Assistive device Rolling walker (2 wheeled)  Gait Pattern/deviations Decreased step length - left;Decreased stance time - left;Decreased stride length  General Gait Details Lknee and hand are weaker today, maybe due to starting with ther ex  Gait velocity reduced  Gait velocity interpretation Below  normal speed for age/gender  Modified Rankin (Stroke Patients Only)  Pre-Morbid Rankin Score 2  Modified Rankin 4  Balance  Overall balance assessment Needs assistance  Sitting-balance support Feet supported  Sitting balance-Leahy Scale Good  Standing balance support Bilateral upper extremity supported;During functional activity  Standing balance-Leahy Scale Fair  Standing balance comment depending on RW   Exercises  Exercises General Lower Extremity  General Exercises - Lower Extremity  Ankle Circles/Pumps AROM;AAROM;Both;5 reps  Quad Sets AROM;Both;10 reps  Heel Slides Strengthening;Both;10 reps  Hip ABduction/ADduction Strengthening;Both;10 reps  PT - End of Session  Equipment Utilized During Treatment Gait belt  Activity Tolerance Patient tolerated treatment well  Patient left in chair;with call bell/phone within reach;with family/visitor present  Nurse Communication Mobility status  PT - Assessment/Plan  PT Plan Current plan remains appropriate  PT Visit Diagnosis Unsteadiness on feet (R26.81);Other abnormalities of gait and mobility (R26.89);Muscle weakness (generalized) (M62.81)  PT Frequency (ACUTE ONLY) 7X/week  Follow Up Recommendations SNF  PT equipment None recommended by PT  AM-PAC PT "6 Clicks" Daily Activity Outcome Measure  Difficulty turning over in bed (including adjusting bedclothes, sheets and blankets)? 3  Difficulty moving from lying on back to sitting on the side of the bed?  3  Difficulty sitting down on and standing up from a chair with arms (e.g., wheelchair, bedside commode, etc,.)? 3  Help needed moving to and from a bed to chair (including a wheelchair)? 3  Help needed walking in hospital room? 3  Help needed climbing 3-5 steps with a railing?  1  6 Click Score 16  Mobility G Code  CK  PT Goal Progression  Progress towards PT goals Progressing toward goals  PT Time Calculation  PT Start Time (ACUTE ONLY) 1325  PT Stop Time (ACUTE ONLY) 1345  PT  Time Calculation (min) (ACUTE ONLY) 20 min  PT G-Codes **NOT FOR INPATIENT CLASS**  Functional Assessment Tool Used AM-PAC 6 Clicks Basic Mobility  PT General Charges  $$ ACUTE PT VISIT 1 Visit  PT Treatments  $Gait Training 8-22 mins    6:39 PM, 04/14/17 Mee Hives, PT, MS Physical Therapist - [place] [phone] (Office)

## 2017-04-15 DIAGNOSIS — E118 Type 2 diabetes mellitus with unspecified complications: Secondary | ICD-10-CM

## 2017-04-15 DIAGNOSIS — E1165 Type 2 diabetes mellitus with hyperglycemia: Secondary | ICD-10-CM

## 2017-04-15 DIAGNOSIS — Z944 Liver transplant status: Secondary | ICD-10-CM

## 2017-04-15 DIAGNOSIS — Z794 Long term (current) use of insulin: Secondary | ICD-10-CM

## 2017-04-15 LAB — GLUCOSE, CAPILLARY
GLUCOSE-CAPILLARY: 142 mg/dL — AB (ref 65–99)
GLUCOSE-CAPILLARY: 157 mg/dL — AB (ref 65–99)
GLUCOSE-CAPILLARY: 88 mg/dL (ref 65–99)
Glucose-Capillary: 128 mg/dL — ABNORMAL HIGH (ref 65–99)

## 2017-04-15 MED ORDER — ASPIRIN 325 MG PO TABS
325.0000 mg | ORAL_TABLET | Freq: Every day | ORAL | Status: DC
  Administered 2017-04-16: 325 mg via ORAL
  Filled 2017-04-15: qty 1

## 2017-04-15 NOTE — NC FL2 (Signed)
[place] MEDICAID FL2 LEVEL OF CARE SCREENING TOOL     IDENTIFICATION  Patient Name: [name] Birthdate: 1940/07/31 Sex: male Admission Date (Current Location): 04/12/2017  [facility] and [place] Number:  [employer] and Address:  [place] [address] [place]      Provider Number: [id number]  Attending Physician Name and Address:  Oswald Hillock, MD  Relative Name and Phone Number:       Current Level of Care: Hospital Recommended Level of Care: [place] Prior Approval Number:    Date Approved/Denied:   PASRR Number: [id number] ([id number])  Discharge Plan: SNF    Current Diagnoses: Patient Active Problem List   Diagnosis Date Noted  . Stroke (cerebrum) ([place]) 04/12/2017  . Left leg weakness 04/12/2017  . CVA (cerebral vascular accident) ([place]) 04/12/2017  . Class 2 obesity due to excess calories with serious comorbidity and body mass index (BMI) of 37.0 to 37.9 in adult 06/29/2016  . Uncontrolled type 2 diabetes mellitus with complication, with long-term current use of insulin ([place]) 06/15/2015  . Mixed hyperlipidemia 06/15/2015  . Essential hypertension, benign 06/15/2015  . Medial meniscus tear 11/24/2012  . Lateral meniscal tear 11/24/2012  . OA (osteoarthritis) of knee 02/28/2012  . Obstructive hyperbilirubinemia 03/27/2011  . Papular urticaria 03/18/2011  . LFTs abnormal 03/17/2011  . Liver replaced by transplant ([place]) 01/05/2009  . GOUT, UNSPECIFIED 01/04/2009  . RADIATION THERAPY, HX OF 01/04/2009  . Hypothyroidism 12/31/2008  . ANEMIA, NORMOCYTIC 12/31/2008  . LIVER FAILURE, ACUTE 12/31/2008  . Other chronic nonalcoholic liver disease 05/05/2535  . CKD (chronic kidney disease), stage II 12/31/2008    Orientation RESPIRATION BLADDER Height & Weight     Self, Time, Situation, Place  Normal Incontinent Weight: 260 lb (117.9 kg) Height:  5' 9.5" (176.5 cm)  BEHAVIORAL SYMPTOMS/MOOD  NEUROLOGICAL BOWEL NUTRITION STATUS      Incontinent Diet (Carb modified. 60 gram carb)  AMBULATORY STATUS COMMUNICATION OF NEEDS Skin   Limited Assist Verbally Normal                       Personal Care Assistance Level of Assistance  Bathing, Feeding, Dressing Bathing Assistance: Limited assistance Feeding assistance: Independent Dressing Assistance: Limited assistance     Functional Limitations Info  Sight, Hearing, Speech Sight Info: Adequate Hearing Info: Adequate Speech Info: Adequate    SPECIAL CARE FACTORS FREQUENCY  PT (By licensed PT)     PT Frequency: 5x/week              Contractures Contractures Info: Not present    Additional Factors Info  Code Status, Allergies, Insulin Sliding Scale Code Status Info: Full Code Allergies Info: Contrast Media,    Insulin Sliding Scale Info: Inject 12-18 units into skin three times daily        Current Medications (04/15/2017):  This is the current hospital active medication list Current Facility-Administered Medications  Medication Dose Route Frequency Provider Last Rate Last Dose  . acetaminophen (TYLENOL) tablet 650 mg  650 mg Oral Q4H PRN Opyd, Ilene Qua, MD       Or  . acetaminophen (TYLENOL) solution 650 mg  650 mg Per Tube Q4H PRN Opyd, Ilene Qua, MD       Or  . acetaminophen (TYLENOL) suppository 650 mg  650 mg Rectal Q4H PRN Opyd, Ilene Qua, MD      . aspirin EC tablet 81 mg  81 mg Oral Daily  Vianne Bulls, MD   81 mg at 04/15/17 1039  . docusate sodium (COLACE) capsule 100 mg  100 mg Oral QHS Opyd, Ilene Qua, MD   100 mg at 04/14/17 2148  . enoxaparin (LOVENOX) injection 40 mg  40 mg Subcutaneous Q24H Opyd, Ilene Qua, MD   40 mg at 04/15/17 1039  . insulin aspart (novoLOG) injection 0-5 Units  0-5 Units Subcutaneous QHS Vianne Bulls, MD   1 Units at 04/15/17 0846  . insulin aspart (novoLOG) injection 0-9 Units  0-9 Units Subcutaneous TID WC Opyd, Ilene Qua, MD   1 Units at 04/15/17 1325  . insulin  aspart (novoLOG) injection 6 Units  6 Units Subcutaneous TID WC Opyd, Ilene Qua, MD   6 Units at 04/15/17 1323  . insulin glargine (LANTUS) injection 30 Units  30 Units Subcutaneous QHS Vianne Bulls, MD   30 Units at 04/14/17 2148  . levothyroxine (SYNTHROID, LEVOTHROID) tablet 137 mcg  137 mcg Oral QAC breakfast Opyd, Ilene Qua, MD   137 mcg at 04/15/17 0841  . lisinopril (PRINIVIL,ZESTRIL) tablet 20 mg  20 mg Oral Daily Oswald Hillock, MD   20 mg at 04/15/17 1039  . multivitamin with minerals tablet 1 tablet  1 tablet Oral Daily Oswald Hillock, MD   1 tablet at 04/15/17 1039  . oxyCODONE-acetaminophen (PERCOCET/ROXICET) 5-325 MG per tablet 1-2 tablet  1-2 tablet Oral Q4H PRN Opyd, Ilene Qua, MD      . sodium chloride 0.9 % 1,000 mL infusion   Intravenous Continuous Oswald Hillock, MD 10 mL/hr at 04/14/17 1229    . tacrolimus (PROGRAF) capsule 1 mg  1 mg Oral BID WC Oswald Hillock, MD   1 mg at 04/15/17 2706     Discharge Medications: Please see discharge summary for a list of discharge medications.  Relevant Imaging Results:  Relevant Lab Results:   Additional Information SSN [id number], Clydene Pugh, LCSW

## 2017-04-15 NOTE — Clinical Social Work Placement (Signed)
   CLINICAL SOCIAL WORK PLACEMENT  NOTE  Date:  04/15/2017  Patient Details  Name: [name] MRN: [id number] Date of Birth: [birth year]  Clinical Social Work is seeking post-discharge placement for this patient at the [place] level of care (*CSW will initial, date and re-position this form in  chart as items are completed):  Yes   Patient/family provided with [facility] Work Department's list of facilities offering this level of care within the geographic area requested by the patient (or if unable, by the patient's family).  Yes   Patient/family informed of their freedom to choose among providers that offer the needed level of care, that participate in Medicare, Medicaid or managed care program needed by the patient, have an available bed and are willing to accept the patient.  Yes   Patient/family informed of [facility]'s ownership interest in [facility] and [facility], as well as of the fact that they are under no obligation to receive care at these facilities.  PASRR submitted to EDS on 04/15/17     PASRR number received on 04/15/17     Existing PASRR number confirmed on       FL2 transmitted to all facilities in geographic area requested by pt/family on 04/15/17     FL2 transmitted to all facilities within larger geographic area on       Patient informed that his/her managed care company has contracts with or will negotiate with certain facilities, including the following:            Patient/family informed of bed offers received.  Patient chooses bed at       Physician recommends and patient chooses bed at      Patient to be transferred to   on  .  Patient to be transferred to facility by       Patient family notified on   of transfer.  Name of family member notified:        PHYSICIAN       Additional Comment:    _______________________________________________ Ihor Gully, LCSW 04/15/2017, 3:12 PM

## 2017-04-15 NOTE — Progress Notes (Signed)
[facility]  PROGRESS NOTE  [name] ZOX:[id number] DOB: March 30, 1941 DOA: 04/12/2017 PCP: Sharilyn Sites, MD   Brief HPI:   76 year old male with a history of liver cirrhosis status post liver transplantation, diabetes mellitus, hypertension CK D stage II hypothyroidism came with acute left leg weakness. MRI brain showed 1 cm acute infarct in the right corona radiata    Subjective   Patient seen and examined, denies any new complaints. Awaiting discharge to SNF   Assessment/Plan:     1. Left-sided weakness-MRI brain showed lacunar infarct on the contralateral side. Patient was evaluated by neurology aspirin has been increased to 325 mg by mouth daily. His factor modification including control of blood pressure, diabetes mellitus and dyslipidemia. 2. Diabetes mellitus-patient's  A1c was 7.4 in May 2018. Management at home with Lantus 32 units subcutaneous daily and NovoLog 12-18 units 3 times a day. In the hospital Lantus has been changed to 30 mg daily at bedtime with NovoLog 6 units 3 times a day, sliding scale insulin.Blood glucose is well controlled. 3. Hypertension- continue Lisinopril 20 mg daily at home.  4. CKD stage II- serum creatinine 1.31 on admission, last creatinine on 04/15/17 was  1.05.   5. Hypothyroidism-continue Synthroid    DVT prophylaxis: Lovenox  Code Status: Full code  Family Communication: Discussed with patient's daughter at bedside on 04/13/2017  Disposition Plan: SNF   Consultants:  Neurology  Procedures:  Echocardiogram  Carotid ultrasound  Continuous infusions . sodium chloride 0.9 % 1,000 mL infusion 10 mL/hr at 04/14/17 1229      Antibiotics:   Anti-infectives    None       Objective   Vitals:   04/15/17 0100 04/15/17 0500 04/15/17 0900 04/15/17 1300  BP: (!) 158/87 (!) 153/76 (!) 142/84 (!) 144/70  Pulse: 73 66 73 69  Resp: 18 18 18 19   Temp: (!) 96 F (35.6 C) 98 F (36.7 C) 97.9 F (36.6 C) 98.1 F (36.7  C)  TempSrc: Oral Oral Oral Oral  SpO2: 96% 96% 98% 97%  Weight:      Height:        Intake/Output Summary (Last 24 hours) at 04/15/17 1442 Last data filed at 04/15/17 1300  Gross per 24 hour  Intake             1150 ml  Output             2200 ml  Net            -1050 ml   Filed Weights   04/12/17 0514  Weight: 117.9 kg (260 lb)     Physical Examination:    Physical Exam: Eyes: No icterus, extraocular muscles intact  Mouth: Oral mucosa is moist, no lesions on palate,  Neck: Supple, no deformities, masses, or tenderness Lungs: Normal respiratory effort, bilateral clear to auscultation, no crackles or wheezes.  Heart: Regular rate and rhythm, S1 and S2 normal, no murmurs, rubs auscultated Abdomen: BS normoactive,soft,nondistended,non-tender to palpation,no organomegaly Extremities: No pretibial edema, no erythema, no cyanosis, no clubbing Neuro : Alert and oriented to time, place and person, No focal deficits Skin: No rashes seen on exam   Data Reviewed: I have personally reviewed following labs and imaging studies  CBG:  Recent Labs Lab 04/14/17 1131 04/14/17 1627 04/14/17 2056 04/15/17 0812 04/15/17 1117  GLUCAP 177* 104* 152* 128* 142*    CBC:  Recent Labs Lab 04/12/17 0450 04/12/17 0507  WBC 8.7  --   NEUTROABS 5.8  --  HGB 14.1 14.6  HCT 41.9 43.0  MCV 97.2  --   PLT 199  --     Basic Metabolic Panel:  Recent Labs Lab 04/12/17 0450 04/12/17 0507 04/14/17 0434  NA 141 143 140  K 4.2 4.2 3.9  CL 108 105 110  CO2 26  --  24  GLUCOSE 142* 141* 115*  BUN 29* 30* 22*  CREATININE 1.31* 1.40* 1.04  CALCIUM 9.0  --  8.5*    No results found for this or any previous visit (from the past 240 hour(s)).   Liver Function Tests:  Recent Labs Lab 04/12/17 0450  AST 19  ALT 18  ALKPHOS 66  BILITOT 1.0  PROT 7.3  ALBUMIN 3.6   No results for input(s): LIPASE, AMYLASE in the last 168 hours. No results for input(s): AMMONIA in the last  168 hours.  Cardiac Enzymes:  Recent Labs Lab 04/12/17 0450  TROPONINI <0.03      Studies: No results found.  Scheduled Meds: . aspirin EC  81 mg Oral Daily  . docusate sodium  100 mg Oral QHS  . enoxaparin (LOVENOX) injection  40 mg Subcutaneous Q24H  . insulin aspart  0-5 Units Subcutaneous QHS  . insulin aspart  0-9 Units Subcutaneous TID WC  . insulin aspart  6 Units Subcutaneous TID WC  . insulin glargine  30 Units Subcutaneous QHS  . levothyroxine  137 mcg Oral QAC breakfast  . lisinopril  20 mg Oral Daily  . multivitamin with minerals  1 tablet Oral Daily  . tacrolimus  1 mg Oral BID WC      Time spent: 30 min  [facility] Pager [phone]. If 7PM-7AM, please contact night-coverage at [email or web address], Office  [phone]  password [email or web address]  04/15/2017, 2:42 PM  LOS: 3 days

## 2017-04-15 NOTE — Progress Notes (Signed)
Physical Therapy Treatment Patient Details Name: [name] MRN: [id number] DOB: 1940/11/23 Today's Date: 04/15/2017    History of Present Illness  RONALD LONDO is a 76 y.o. male with medical history significant for cirrhosis status post liver transplantation, insulin-dependent diabetes mellitus, hypertension, chronic kidney disease stage II, and hypothyroidism, now presenting to the emergency department with acute left leg weakness. Patient reports that he was in his usual state of health, had an uneventful day and played golf yesterday, and went to bed in his usual state approximately 11 PM. Patient reports waking at approximately 3 AM to urinate and had difficulty using his left leg at that time. He also reports some slight dizziness    PT Comments    Patient had difficulty maintaining sitting balance at bedside while performing BLE exercises due to frequent leaning to the left, when correcting by laterally flexing right trunk it cause increased low back pain.  Patient will benefit from continued physical therapy in hospital and recommended venue below to increase strength, balance, endurance for safe ADLs and gait.    Follow Up Recommendations  SNF     Equipment Recommendations  None recommended by PT    Recommendations for Other Services       Precautions / Restrictions Precautions Precautions: Fall Restrictions Weight Bearing Restrictions: No    Mobility  Bed Mobility Overal bed mobility: Needs Assistance Bed Mobility: Supine to Sit;Sit to Supine     Supine to sit: Min guard Sit to supine: Min guard   General bed mobility comments: required much time and usage of bed rail to sit up  Transfers Overall transfer level: Needs assistance Equipment used: Rolling walker (2 wheeled) Transfers: Sit to/from [facility] Sit to Stand: Min guard Stand pivot transfers: Min guard          Ambulation/Gait Ambulation/Gait assistance: Min  guard Ambulation Distance (Feet): 50 Feet Assistive device: Rolling walker (2 wheeled) Gait Pattern/deviations: Decreased step length - left;Decreased stance time - left;Decreased stride length   Gait velocity interpretation: Below normal speed for age/gender General Gait Details: demonstrates slow labored cadence with fair return for left heel to toe stepping after verbal cues, limited secondary to c/o fatigue   Stairs            Wheelchair Mobility    Modified Rankin (Stroke Patients Only)       Balance Overall balance assessment: Needs assistance Sitting-balance support: Feet supported;No upper extremity supported Sitting balance-Leahy Scale: Fair Sitting balance - Comments: frequent leaning over to the left, most diffiuclty when completing BLE ROM/strenghening exercises Postural control: Left lateral lean Standing balance support: Bilateral upper extremity supported;During functional activity Standing balance-Leahy Scale: Fair                              Cognition Arousal/Alertness: Awake/alert Behavior During Therapy: WFL for tasks assessed/performed Overall Cognitive Status: Within Functional Limits for tasks assessed                                        Exercises General Exercises - Lower Extremity Long Arc Quad: Seated;AROM;Strengthening;Both;10 reps Hip Flexion/Marching: Seated;AROM;Strengthening;Both;10 reps Toe Raises: Seated;AROM;Strengthening;Both;10 reps Heel Raises: Seated;AROM;Strengthening;Both;10 reps    General Comments        Pertinent Vitals/Pain Pain Assessment: Faces Pain Score: 3  Pain Location: low back right side with movement Pain  Descriptors / Indicators: Sharp Pain Intervention(s): Limited activity within patient's tolerance;Monitored during session    Home Living                      Prior Function            PT Goals (current goals can now be found in the care plan section) Acute  Rehab PT Goals Patient Stated Goal: home with family after rehab PT Goal Formulation: With patient/family Time For Goal Achievement: 04/19/17 Potential to Achieve Goals: Good Progress towards PT goals: Progressing toward goals    Frequency    7X/week      PT Plan Current plan remains appropriate    Co-evaluation              AM-PAC PT "6 Clicks" Daily Activity  Outcome Measure  Difficulty turning over in bed (including adjusting bedclothes, sheets and blankets)?: A Little Difficulty moving from lying on back to sitting on the side of the bed? : A Little Difficulty sitting down on and standing up from a chair with arms (e.g., wheelchair, bedside commode, etc,.)?: A Little Help needed moving to and from a bed to chair (including a wheelchair)?: A Little Help needed walking in hospital room?: A Little Help needed climbing 3-5 steps with a railing? : A Lot 6 Click Score: 17    End of Session Equipment Utilized During Treatment: Gait belt Activity Tolerance: Patient tolerated treatment well;Patient limited by fatigue Patient left: in chair;with call bell/phone within reach;with family/visitor present Nurse Communication: Mobility status PT Visit Diagnosis: Unsteadiness on feet (R26.81);Other abnormalities of gait and mobility (R26.89);Muscle weakness (generalized) (M62.81)     Time: 0947-0962 PT Time Calculation (min) (ACUTE ONLY): 25 min  Charges:  $Therapeutic Activity: 23-37 mins                    G Codes:       2:36 PM, 2017-04-19 Lonell Grandchild, MPT Physical Therapist with [facility] [phone] office [phone] mobile phone

## 2017-04-15 NOTE — Clinical Social Work Note (Signed)
Clinical Social Work Assessment  Patient Details  Name: [name] MRN: [id number] Date of Birth: [birth year]  Date of referral:  04/15/17               Reason for consult:  Discharge Planning                Permission sought to share information with:    Permission granted to share information::     Name::        Agency::     Relationship::     Contact Information:  Daughter, Wells Guiles, son in law; and spouse were at bedsie.   Housing/Transportation Living arrangements for the past 2 months:  [place] of Information:  Adult Children Patient Interpreter Needed:  None Criminal Activity/Legal Involvement Pertinent to Current Situation/Hospitalization:  No - Comment as needed Significant Relationships:  Adult Children, Spouse, Other Family Members Lives with:  Spouse Do you feel safe going back to the place where you live?  Yes Need for family participation in patient care:  Yes (Comment)  Care giving concerns:  None identified. Patient is very independent at baseline. At baseline, patient drives, plays golf, and lives a very independent. He ambulates assisted at baseline.    Social Worker assessment / plan:  Patient is agreeable to SNF. LCSW faxed referral out to family's two choices.  LCSW will provide bed offers as they are made.   Employment status:  Retired Nurse, adult PT Recommendations:  [place] / Referral to community resources:  [place]  Patient/Family's Response to care:  Patient and family are agreeable to SNF.   Patient/Family's Understanding of and Emotional Response to Diagnosis, Current Treatment, and Prognosis:  Patient and family understand patient's diagnosis, treatment and prognosis.   Emotional Assessment Appearance:  Appears stated age Attitude/Demeanor/Rapport:    Affect (typically observed):  Accepting, Calm Orientation:  Oriented to Situation, Oriented to   Time, Oriented to Place, Oriented to Self Alcohol / Substance use:  Not Applicable Psych involvement (Current and /or in the community):  No (Comment)  Discharge Needs  Concerns to be addressed:  Discharge Planning Concerns Readmission within the last 30 days:  No Current discharge risk:  None Barriers to Discharge:  No Barriers Identified   Ihor Gully, LCSW 04/15/2017, 2:57 PM

## 2017-04-15 NOTE — Clinical Social Work Placement (Signed)
   CLINICAL SOCIAL WORK PLACEMENT  NOTE  Date:  04/15/2017  Patient Details  Name: ARK AGRUSA MRN: [id number] Date of Birth: Jan 04, 1941  Clinical Social Work is seeking post-discharge placement for this patient at the [place] level of care (*CSW will initial, date and re-position this form in  chart as items are completed):  Yes   Patient/family provided with [place] Work Department's list of facilities offering this level of care within the geographic area requested by the patient (or if unable, by the patient's family).  Yes   Patient/family informed of their freedom to choose among providers that offer the needed level of care, that participate in Medicare, Medicaid or managed care program needed by the patient, have an available bed and are willing to accept the patient.  Yes   Patient/family informed of [facility]'s ownership interest in [facility] and [facility], as well as of the fact that they are under no obligation to receive care at these facilities.  PASRR submitted to EDS on 04/15/17     PASRR number received on 04/15/17     Existing PASRR number confirmed on       FL2 transmitted to all facilities in geographic area requested by pt/family on 04/15/17     FL2 transmitted to all facilities within larger geographic area on       Patient informed that his/her managed care company has contracts with or will negotiate with certain facilities, including the following:            Patient/family informed of bed offers received.  Patient chooses bed at [facility]     Physician recommends and patient chooses bed at      Patient to be transferred to   on  .  Patient to be transferred to facility by       Patient family notified on   of transfer.  Name of family member notified:        PHYSICIAN       Additional Comment:    _______________________________________________ Ihor Gully,  LCSW 04/15/2017, 3:13 PM

## 2017-04-15 NOTE — Clinical Social Work Note (Signed)
Patient referred to CSW for placement. Patient's PT evaluation recommends HHPT/Supervision/Assistance/24hr.  Patient is not currently in need of placement.   LCSW signing off.    [name], Kyle Pugh, LCSW

## 2017-04-16 ENCOUNTER — Emergency Department (HOSPITAL_COMMUNITY)
Admission: EM | Admit: 2017-04-16 | Discharge: 2017-04-16 | Disposition: A | Discharge status: Home / Self Care | Payer: PPO

## 2017-04-16 ENCOUNTER — Inpatient Hospital Stay
Admission: RE | Admit: 2017-04-16 | Discharge: 2017-05-07 | Disposition: A | Discharge status: Home / Self Care | Payer: PPO | Source: Ambulatory Visit | Attending: Internal Medicine | Admitting: Internal Medicine

## 2017-04-16 DIAGNOSIS — M545 Low back pain: Secondary | ICD-10-CM | POA: Diagnosis not present

## 2017-04-16 DIAGNOSIS — E119 Type 2 diabetes mellitus without complications: Secondary | ICD-10-CM | POA: Diagnosis not present

## 2017-04-16 DIAGNOSIS — R29898 Other symptoms and signs involving the musculoskeletal system: Secondary | ICD-10-CM | POA: Diagnosis not present

## 2017-04-16 DIAGNOSIS — M5489 Other dorsalgia: Secondary | ICD-10-CM | POA: Diagnosis not present

## 2017-04-16 DIAGNOSIS — K861 Other chronic pancreatitis: Secondary | ICD-10-CM | POA: Diagnosis not present

## 2017-04-16 DIAGNOSIS — R2689 Other abnormalities of gait and mobility: Secondary | ICD-10-CM | POA: Diagnosis not present

## 2017-04-16 DIAGNOSIS — N182 Chronic kidney disease, stage 2 (mild): Secondary | ICD-10-CM | POA: Diagnosis not present

## 2017-04-16 DIAGNOSIS — M6281 Muscle weakness (generalized): Secondary | ICD-10-CM | POA: Diagnosis not present

## 2017-04-16 DIAGNOSIS — Z23 Encounter for immunization: Secondary | ICD-10-CM | POA: Diagnosis not present

## 2017-04-16 DIAGNOSIS — H2513 Age-related nuclear cataract, bilateral: Secondary | ICD-10-CM | POA: Diagnosis not present

## 2017-04-16 DIAGNOSIS — E118 Type 2 diabetes mellitus with unspecified complications: Secondary | ICD-10-CM | POA: Diagnosis not present

## 2017-04-16 DIAGNOSIS — I639 Cerebral infarction, unspecified: Secondary | ICD-10-CM | POA: Diagnosis not present

## 2017-04-16 DIAGNOSIS — R278 Other lack of coordination: Secondary | ICD-10-CM | POA: Diagnosis not present

## 2017-04-16 DIAGNOSIS — E039 Hypothyroidism, unspecified: Secondary | ICD-10-CM | POA: Diagnosis not present

## 2017-04-16 DIAGNOSIS — I10 Essential (primary) hypertension: Secondary | ICD-10-CM | POA: Diagnosis not present

## 2017-04-16 DIAGNOSIS — M533 Sacrococcygeal disorders, not elsewhere classified: Secondary | ICD-10-CM | POA: Diagnosis not present

## 2017-04-16 DIAGNOSIS — Z794 Long term (current) use of insulin: Secondary | ICD-10-CM | POA: Diagnosis not present

## 2017-04-16 DIAGNOSIS — I8500 Esophageal varices without bleeding: Secondary | ICD-10-CM | POA: Diagnosis not present

## 2017-04-16 DIAGNOSIS — E1165 Type 2 diabetes mellitus with hyperglycemia: Secondary | ICD-10-CM | POA: Diagnosis not present

## 2017-04-16 DIAGNOSIS — Z944 Liver transplant status: Secondary | ICD-10-CM | POA: Diagnosis not present

## 2017-04-16 DIAGNOSIS — M546 Pain in thoracic spine: Secondary | ICD-10-CM | POA: Diagnosis not present

## 2017-04-16 LAB — GLUCOSE, CAPILLARY
Glucose-Capillary: 116 mg/dL — ABNORMAL HIGH (ref 65–99)
Glucose-Capillary: 185 mg/dL — ABNORMAL HIGH (ref 65–99)
Glucose-Capillary: 110 mg/dL — ABNORMAL HIGH (ref 65–99)

## 2017-04-16 MED ORDER — ASPIRIN 325 MG PO TABS: 325.0000 mg | ORAL_TABLET | Freq: Every day | ORAL | 2 refills | Status: AC

## 2017-04-16 MED ORDER — OXYCODONE-ACETAMINOPHEN 5-325 MG PO TABS: 1.0000 | ORAL_TABLET | ORAL | 0 refills | Status: DC | PRN

## 2017-04-16 NOTE — Clinical Social Work Placement (Signed)
   CLINICAL SOCIAL WORK PLACEMENT  NOTE  Date:  04/16/2017  Patient Details  Name: [name] MRN: [id number] Date of Birth: June 18, 1941  Clinical Social Work is seeking post-discharge placement for this patient at the [place] level of care (*CSW will initial, date and re-position this form in  chart as items are completed):  Yes   Patient/family provided with [place] Work Department's list of facilities offering this level of care within the geographic area requested by the patient (or if unable, by the patient's family).  Yes   Patient/family informed of their freedom to choose among providers that offer the needed level of care, that participate in Medicare, Medicaid or managed care program needed by the patient, have an available bed and are willing to accept the patient.  Yes   Patient/family informed of [facility]'s ownership interest in [facility] and [facility], as well as of the fact that they are under no obligation to receive care at these facilities.  PASRR submitted to EDS on 04/15/17     PASRR number received on 04/15/17     Existing PASRR number confirmed on       FL2 transmitted to all facilities in geographic area requested by pt/family on 04/15/17     FL2 transmitted to all facilities within larger geographic area on       Patient informed that his/her managed care company has contracts with or will negotiate with certain facilities, including the following:            Patient/family informed of bed offers received.  Patient chooses bed at [facility]     Physician recommends and patient chooses bed at      Patient to be transferred to [facility] on 04/16/17.  Patient to be transferred to facility by [facility] staff     Patient family notified on 04/16/17 of transfer.  Name of family member notified:  Patient's family is aware (Daughter, [name]).     PHYSICIAN       Additional Comment:  LCSW  left a voicemail message for Marianna Fuss advising of discharge and patient's authorization (7 days [phone]). Discharge clinicals sent via [employer]. Patient's daughter [name] aware of discharge. LCSW signing off.   _______________________________________________ Ambrose Pancoast D, LCSW 04/16/2017, 11:13 AM

## 2017-04-16 NOTE — Care Management Important Message (Signed)
Important Message  Patient Details  Name: [name] MRN: [id number] Date of Birth: [birth year]   Medicare Important Message Given:  Yes    Laikynn Pollio, Chauncey [place], RN 04/16/2017, 7:42 AM

## 2017-04-16 NOTE — Progress Notes (Signed)
Pt discharged to [facility] via wheelchair

## 2017-04-16 NOTE — Progress Notes (Signed)
Report called to [place] LPN at [place] [facility].  All questions answered to her satisfaction.

## 2017-04-16 NOTE — Discharge Summary (Signed)
Physician Discharge Summary  [name]:[id number] DOB: Dec 26, 1940 DOA: 04/12/2017  PCP: Sharilyn Sites, MD  Admit date: 04/12/2017 Discharge date: 04/16/2017  Time spent: *35 minutes  Recommendations for Outpatient Follow-up:  1. Follow PCP in 2 weeks   Discharge Diagnoses:  Principal Problem:   Stroke (cerebrum) ([place]) Active Problems:   Hypothyroidism   CKD (chronic kidney disease), stage II   Liver replaced by transplant [facility])   Uncontrolled type 2 diabetes mellitus with complication, with long-term current use of insulin (HCC)   Essential hypertension, benign   Left leg weakness   CVA (cerebral vascular accident) [facility])   Discharge Condition: Stable  Diet recommendation: Heart healthy diet  Filed Weights   04/12/17 0514 04/16/17 0525  Weight: 117.9 kg (260 lb) 117.9 kg (259 lb 15.1 oz)    History of present illness:  76 year old male with a history of liver cirrhosis status post liver transplantation, diabetes mellitus, hypertension CK D stage II hypothyroidism came with acute left leg weakness. MRI brain showed 1 cm acute infarct in the right corona radiata  Hospital Course:  1. Left-sided weakness-MRI brain showed lacunar infarct on the contralateral side. Patient was evaluated by neurology aspirin has been increased to 325 mg by mouth daily. Risk factor modification including control of blood pressure, diabetes mellitus and dyslipidemia. 2. Diabetes mellitus-patient's  A1c was 7.4 in May 2018. Management at home with Lantus 32 units subcutaneous daily and NovoLog 12-18 units 3 times a day. Continue same regimen. 3. Hypertension- continue Lisinopril 20 mg daily at home dose.  4. CKD stage II- serum creatinine 1.31 on admission, last creatinine on 04/14/17 was  1.04.   5. Hypothyroidism-continue Synthroid  Procedures:  Carotid duplex  Echocardiogram  Consultations:  Neurology  Discharge Exam: Vitals:   04/16/17 0525 04/16/17 0925  BP: 122/74 135/75   Pulse: 64 68  Resp: 20 19  Temp: 98.5 F (36.9 C) 98.1 F (36.7 C)  SpO2: 98% 96%    General: Appears in no acute distress Cardiovascular: S1-S2, regular Respiratory: Clear to auscultation bilaterally Neuro- no focal deficit  Discharge Instructions   Discharge Instructions    Diet - low sodium heart healthy    Complete by:  As directed    Increase activity slowly    Complete by:  As directed      Current Discharge Medication List    CONTINUE these medications which have CHANGED   Details  aspirin 325 MG tablet Take 1 tablet (325 mg total) by mouth daily. Qty: 30 tablet, Refills: 2    oxyCODONE-acetaminophen (PERCOCET/ROXICET) 5-325 MG tablet Take 1 tablet by mouth every 4 (four) hours as needed. Qty: 10 tablet, Refills: 0   Associated Diagnoses: Medial meniscus, posterior horn derangement, left      CONTINUE these medications which have NOT CHANGED   Details  docusate sodium (COLACE) 100 MG capsule Take 100 mg by mouth at bedtime.    insulin aspart (NOVOLOG) 100 UNIT/ML FlexPen Inject 12-18 Units into the skin 3 (three) times daily with meals. Qty: 10 pen, Refills: 2    Insulin Glargine (LANTUS SOLOSTAR) 100 UNIT/ML Solostar Pen Inject 32 Units into the skin daily at 10 pm. Qty: 10 pen, Refills: 1    levothyroxine (SYNTHROID, LEVOTHROID) 137 MCG tablet Take 1 tablet (137 mcg total) by mouth daily before breakfast. Qty: 30 tablet, Refills: 6    lisinopril (PRINIVIL,ZESTRIL) 20 MG tablet Take 20 mg by mouth daily.     Multiple Vitamins-Minerals (MULTIVITAMIN WITH MINERALS) tablet Take  1 tablet by mouth daily.      predniSONE (DELTASONE) 10 MG tablet Take 5 mg by mouth as needed.     Specialty Vitamins Products (MAGNESIUM, AMINO ACID CHELATE,) 133 MG tablet Take 1 tablet by mouth 2 (two) times daily. Magnesium 133mg      tacrolimus (PROGRAF) 1 MG capsule Take 1 mg by mouth 2 (two) times daily. 105:  Pt states he takes 1mg  bid with meals (which is a change from  previous dose of 5mg  daily)       Allergies  Allergen Reactions  . Contrast Media [Iodinated Diagnostic Agents] Rash    Contact information for follow-up providers    [facility].   Contact information: [address] [place] [address] [phone]            Contact information for after-discharge care    [facility] .   Specialty:  Skilled Nursing Facility Contact information: [address] [place] [phone]                   The results of significant diagnostics from this hospitalization (including imaging, microbiology, ancillary and laboratory) are listed below for reference.    Significant Diagnostic Studies: Mr Brain Wo Contrast  Result Date: 04/12/2017 CLINICAL DATA:  Ataxia, stroke suspected. EXAM: MRI HEAD WITHOUT CONTRAST MRA HEAD WITHOUT CONTRAST TECHNIQUE: Multiplanar, multiecho pulse sequences of the brain and surrounding structures were obtained without intravenous contrast. Angiographic images of the head were obtained using MRA technique without contrast. COMPARISON:  Head CT from earlier today FINDINGS: MRI HEAD FINDINGS Brain: 1 cm focus of restricted diffusion in the posterior right corona radiata. Overall mild chronic small vessel ischemic change in the cerebral white matter. There has been a remote small vessel infarct in the left corona radiata. Age normal brain volume. No mass, hemorrhage, or hydrocephalus. Vascular: Arterial findings below. Normal dural venous sinus flow voids. Skull and upper cervical spine: Negative for marrow lesion. Sinuses/Orbits: Negative MRA HEAD FINDINGS Motion degraded. No branch occlusion or flow limiting stenosis seen in the anterior circulation. There is mild narrowing of the right ICA at the anterior genu. Codominant vertebral arteries. Mild left V4 segment narrowing. There is a high-grade proximal right P2 segment  stenosis with less intense downstream flow. No flow seen beyond the mid left P2 segment. Negative for aneurysm. IMPRESSION: Brain MRI: 1. 1 cm acute infarct in the right corona radiata. 2. Mild chronic small vessel ischemia in the cerebral white matter. Intracranial MRA: 1. Motion degraded. 2. Intracranial atherosclerosis with high-grade proximal right and mid left P2 segment stenoses. 3. Mild left V4 segment narrowing. Electronically Signed   By: Monte Fantasia M.D.   On: 04/12/2017 07:22   US Carotid Bilateral (at [facility] Only)  Result Date: 04/12/2017 CLINICAL DATA:  76 year old male with a history of CVA. Cardiovascular risk factors include hypertension, known prior stroke/TIA, hyperlipidemia, diabetes EXAM: BILATERAL CAROTID DUPLEX ULTRASOUND TECHNIQUE: Pearline Cables scale imaging, color Doppler and duplex ultrasound were performed of bilateral carotid and vertebral arteries in the neck. COMPARISON:  No prior duplex FINDINGS: Criteria: Quantification of carotid stenosis is based on velocity parameters that correlate the residual internal carotid diameter with NASCET-based stenosis levels, using the diameter of the distal internal carotid lumen as the denominator for stenosis measurement. The following velocity measurements were obtained: RIGHT ICA:  Systolic 61 cm/sec, Diastolic 25 cm/sec CCA:  87 cm/sec SYSTOLIC ICA/CCA  RATIO:  0.7 ECA:  80 set cm/sec LEFT ICA:  Systolic 55 cm/sec, Diastolic 11 cm/sec CCA:  94 cm/sec SYSTOLIC ICA/CCA RATIO:  0.6 ECA:  133 cm/sec Right Brachial SBP: Not acquired Left Brachial SBP: Not acquired RIGHT CAROTID ARTERY: No significant calcifications of the right common carotid artery. Intermediate waveform maintained. Heterogeneous and partially calcified plaque at the right carotid bifurcation. No significant lumen shadowing. Low resistance waveform of the right ICA. Tortuosity RIGHT VERTEBRAL ARTERY: Antegrade flow with low resistance waveform. LEFT CAROTID ARTERY: No significant  calcifications of the left common carotid artery. Intermediate waveform maintained. Heterogeneous and partially calcified plaque at the left carotid bifurcation without significant lumen shadowing. Low resistance waveform of the left ICA. tortuosity. LEFT VERTEBRAL ARTERY:  Antegrade flow with low resistance waveform. IMPRESSION: Color duplex indicates minimal heterogeneous and calcified plaque, with no hemodynamically significant stenosis by duplex criteria in the extracranial cerebrovascular circulation. Signed, Dulcy Fanny. Earleen Newport, DO [facility] Electronically Signed   By: Corrie Mckusick D.O.   On: 04/12/2017 10:57   Mr Jodene Nam Head/brain LZ Cm  Result Date: 04/12/2017 CLINICAL DATA:  Ataxia, stroke suspected. EXAM: MRI HEAD WITHOUT CONTRAST MRA HEAD WITHOUT CONTRAST TECHNIQUE: Multiplanar, multiecho pulse sequences of the brain and surrounding structures were obtained without intravenous contrast. Angiographic images of the head were obtained using MRA technique without contrast. COMPARISON:  Head CT from earlier today FINDINGS: MRI HEAD FINDINGS Brain: 1 cm focus of restricted diffusion in the posterior right corona radiata. Overall mild chronic small vessel ischemic change in the cerebral white matter. There has been a remote small vessel infarct in the left corona radiata. Age normal brain volume. No mass, hemorrhage, or hydrocephalus. Vascular: Arterial findings below. Normal dural venous sinus flow voids. Skull and upper cervical spine: Negative for marrow lesion. Sinuses/Orbits: Negative MRA HEAD FINDINGS Motion degraded. No branch occlusion or flow limiting stenosis seen in the anterior circulation. There is mild narrowing of the right ICA at the anterior genu. Codominant vertebral arteries. Mild left V4 segment narrowing. There is a high-grade proximal right P2 segment stenosis with less intense downstream flow. No flow seen beyond the mid left P2  segment. Negative for aneurysm. IMPRESSION: Brain MRI: 1. 1 cm acute infarct in the right corona radiata. 2. Mild chronic small vessel ischemia in the cerebral white matter. Intracranial MRA: 1. Motion degraded. 2. Intracranial atherosclerosis with high-grade proximal right and mid left P2 segment stenoses. 3. Mild left V4 segment narrowing. Electronically Signed   By: Monte Fantasia M.D.   On: 04/12/2017 07:22   Ct Head Code Stroke Wo Contrast  Result Date: 04/12/2017 CLINICAL DATA:  Code stroke. Acute onset LEFT-sided weakness. History of hypertension, diabetes. EXAM: CT HEAD WITHOUT CONTRAST TECHNIQUE: Contiguous axial images were obtained from the base of the skull through the vertex without intravenous contrast. COMPARISON:  CT HEAD November 03, 2004 FINDINGS: BRAIN: No intraparenchymal hemorrhage, mass effect nor midline shift. The ventricles and sulci are normal for age. Patchy supratentorial white matter hypodensities within normal range for patient's age, though non-specific are most compatible with chronic small vessel ischemic disease. Old LEFT basal ganglia and LEFT thalamus lacunar infarcts. No acute large vascular territory infarcts. No abnormal extra-axial fluid collections. Basal cisterns are patent. VASCULAR: Moderate to severe calcific atherosclerosis of the carotid siphons. Tiny densities along bilateral middle cerebral artery's compatible with atherosclerosis. SKULL: No skull fracture. No significant scalp soft tissue swelling. SINUSES/ORBITS: The mastoid air-cells and included paranasal sinuses are well-aerated.The  included ocular globes and orbital contents are non-suspicious. OTHER: None. ASPECTS [facility] Stroke Program Early CT Score) - Ganglionic level infarction (caudate, lentiform nuclei, internal capsule, insula, M1-M3 cortex): 7 - Supraganglionic infarction (M4-M6 cortex): 3 Total score (0-10 with 10 being normal): 10 IMPRESSION: 1. Negative noncontrast CT HEAD for age. 2. ASPECTS is  10. 3. Critical Value/emergent results were called by telephone at the time of interpretation on 04/12/2017 at 5:10 am to Dr. Rolland Porter , who verbally acknowledged these results. Electronically Signed   By: Elon Alas M.D.   On: 04/12/2017 05:11    Microbiology: No results found for this or any previous visit (from the past 240 hour(s)).   Labs: Basic Metabolic Panel:  Recent Labs Lab 04/12/17 0450 04/12/17 0507 04/14/17 0434  NA 141 143 140  K 4.2 4.2 3.9  CL 108 105 110  CO2 26  --  24  GLUCOSE 142* 141* 115*  BUN 29* 30* 22*  CREATININE 1.31* 1.40* 1.04  CALCIUM 9.0  --  8.5*   Liver Function Tests:  Recent Labs Lab 04/12/17 0450  AST 19  ALT 18  ALKPHOS 66  BILITOT 1.0  PROT 7.3  ALBUMIN 3.6   No results for input(s): LIPASE, AMYLASE in the last 168 hours. No results for input(s): AMMONIA in the last 168 hours. CBC:  Recent Labs Lab 04/12/17 0450 04/12/17 0507  WBC 8.7  --   NEUTROABS 5.8  --   HGB 14.1 14.6  HCT 41.9 43.0  MCV 97.2  --   PLT 199  --    Cardiac Enzymes:  Recent Labs Lab 04/12/17 0450  TROPONINI <0.03    CBG:  Recent Labs Lab 04/15/17 0812 04/15/17 1117 04/15/17 1708 04/15/17 2144 04/16/17 0729  GLUCAP 128* 142* 157* 88 116*       Signed:  Eleonore Chiquito S MD.  [facility] 04/16/2017, 10:14 AM

## 2017-04-16 NOTE — Progress Notes (Signed)
Physical Therapy Treatment Patient Details Name: [name] MRN: [id number] DOB: [birth year] Today's Date: 04/16/2017    History of Present Illness  [name] is a 76 y.o. male with medical history significant for cirrhosis status post liver transplantation, insulin-dependent diabetes mellitus, hypertension, chronic kidney disease stage II, and hypothyroidism, now presenting to the emergency department with acute left leg weakness. Patient reports that he was in his usual state of health, had an uneventful day and played golf yesterday, and went to bed in his usual state approximately 11 PM. Patient reports waking at approximately 3 AM to urinate and had difficulty using his left leg at that time. He also reports some slight dizziness    PT Comments    Patient has to walk slower with tendency to look down at feet when gait training without assistive device, limited secondary to fatigue with fair/poor return for left heel to toe stepping (see below).  Patient will benefit from continued physical therapy in hospital and recommended venue below to increase strength, balance, endurance for safe ADLs and gait.    Follow Up Recommendations  SNF     Equipment Recommendations  None recommended by PT    Recommendations for Other Services       Precautions / Restrictions Precautions Precautions: Fall Restrictions Weight Bearing Restrictions: No    Mobility  Bed Mobility               General bed mobility comments: Patient presents seated in chair  Transfers Overall transfer level: Needs assistance Equipment used: Rolling walker (2 wheeled) Transfers: Sit to/from Stand;Stand Pivot Transfers Sit to Stand: Min guard Stand pivot transfers: Min guard          Ambulation/Gait Ambulation/Gait assistance: Min assist;Min guard Ambulation Distance (Feet): 50 Feet Assistive device: 1 person hand held assist;Rolling walker (2 wheeled) Gait Pattern/deviations: Decreased step  length - left;Decreased stance time - left;Decreased stride length   Gait velocity interpretation: Below normal speed for age/gender General Gait Details: Patient demonstrates unsteady cadence with left slap foot requiring Min assist during gait training without assistive device, requires verbal cues to flex knees when taking steps using RW, fair/poor return for attemting heel to toe stepping using RW or without assistive device   Stairs            Wheelchair Mobility    Modified Rankin (Stroke Patients Only)       Balance Overall balance assessment: Needs assistance Sitting-balance support: Feet supported;No upper extremity supported Sitting balance-Leahy Scale: Good Sitting balance - Comments: demonstrates improvement for contacting abdominal muscles and keeping trunk in midline while completing BLE and trunk control exercises while seated at bedside   Standing balance support: Bilateral upper extremity supported;During functional activity Standing balance-Leahy Scale: Fair                              Cognition Arousal/Alertness: Awake/alert Behavior During Therapy: WFL for tasks assessed/performed Overall Cognitive Status: Within Functional Limits for tasks assessed                                        Exercises General Exercises - Lower Extremity Long Arc Quad: Seated;AROM;Strengthening;Both;10 reps Hip Flexion/Marching: Seated;AROM;Strengthening;Both;10 reps Toe Raises: Seated;AROM;Strengthening;Both;10 reps Heel Raises: Seated;AROM;Strengthening;Both;10 reps    General Comments        Pertinent Vitals/Pain Pain Assessment: No/denies  pain    Home Living                      Prior Function            PT Goals (current goals can now be found in the care plan section) Acute Rehab PT Goals Patient Stated Goal: home with family after rehab PT Goal Formulation: With patient/family Time For Goal Achievement:  04/19/17 Potential to Achieve Goals: Good Progress towards PT goals: Progressing toward goals    Frequency    7X/week      PT Plan Current plan remains appropriate    Co-evaluation              AM-PAC PT "6 Clicks" Daily Activity  Outcome Measure  Difficulty turning over in bed (including adjusting bedclothes, sheets and blankets)?: A Little Difficulty moving from lying on back to sitting on the side of the bed? : A Little Difficulty sitting down on and standing up from a chair with arms (e.g., wheelchair, bedside commode, etc,.)?: A Little Help needed moving to and from a bed to chair (including a wheelchair)?: A Little Help needed walking in hospital room?: A Little Help needed climbing 3-5 steps with a railing? : A Lot 6 Click Score: 17    End of Session Equipment Utilized During Treatment: Gait belt Activity Tolerance: Patient tolerated treatment well;Patient limited by fatigue Patient left: in chair;with call bell/phone within reach Nurse Communication: Mobility status PT Visit Diagnosis: Unsteadiness on feet (R26.81);Other abnormalities of gait and mobility (R26.89);Muscle weakness (generalized) (M62.81)     Time: 1607-3710 PT Time Calculation (min) (ACUTE ONLY): 31 min  Charges:  $Therapeutic Activity: 23-37 mins                    G Codes:       11:53 AM, Apr 22, 2017 Lonell Grandchild, MPT Physical Therapist with [facility] 336 [phone] office [phone] mobile phone

## 2017-04-16 NOTE — Consult Note (Signed)
   [facility] CM Inpatient Consult   04/16/2017  [name] Oct 21, 1940 [id number]   Patient screened for potential [place] Management services. Patient is on the [facility] registry as a benefit of their [employer] medicare . Electronic medical record reveals patient's discharge plan is SNF[facility] Care Management services not appropriate at this time. If patient's post hospital needs change please place a [facility] Care Management consult. For questions please contact:   Waqas Bruhl RN, [place] [facility] Liaison  [phone]) Business Mobile [phone]) Toll free office

## 2017-04-17 ENCOUNTER — Encounter: Payer: Self-pay | Admitting: Internal Medicine

## 2017-04-17 ENCOUNTER — Non-Acute Institutional Stay (SKILLED_NURSING_FACILITY): Payer: PPO | Admitting: Internal Medicine

## 2017-04-17 DIAGNOSIS — E1165 Type 2 diabetes mellitus with hyperglycemia: Secondary | ICD-10-CM | POA: Diagnosis not present

## 2017-04-17 DIAGNOSIS — I639 Cerebral infarction, unspecified: Secondary | ICD-10-CM

## 2017-04-17 DIAGNOSIS — Z794 Long term (current) use of insulin: Secondary | ICD-10-CM | POA: Diagnosis not present

## 2017-04-17 DIAGNOSIS — I10 Essential (primary) hypertension: Secondary | ICD-10-CM | POA: Diagnosis not present

## 2017-04-17 DIAGNOSIS — E118 Type 2 diabetes mellitus with unspecified complications: Secondary | ICD-10-CM | POA: Diagnosis not present

## 2017-04-17 DIAGNOSIS — Z944 Liver transplant status: Secondary | ICD-10-CM

## 2017-04-17 DIAGNOSIS — IMO0002 Reserved for concepts with insufficient information to code with codable children: Secondary | ICD-10-CM

## 2017-04-17 DIAGNOSIS — E039 Hypothyroidism, unspecified: Secondary | ICD-10-CM | POA: Diagnosis not present

## 2017-04-17 NOTE — Progress Notes (Signed)
Location:   [facility] Room Number: 143/P Place of Service:  SNF (31) Provider:  Lilia Pro, MD  Patient Care Team: Sharilyn Sites, MD as PCP - General (Family Medicine) Gala Romney Cristopher Estimable, MD (Gastroenterology)  Extended Emergency Contact Information Primary Emergency Contact: Wyona Almas Address: [address]          [place], [place] [address] Johnnette Litter of [place] Phone: [phone] Relation: Spouse Secondary Emergency Contact: Ginette Pitman States of [place] Phone: [phone] Mobile Phone: [phone] Relation: Daughter  Code Status:  Full Code Goals of care: Advanced Directive information Advanced Directives 04/17/2017  Does Patient Have a Medical Advance Directive? Yes  Type of Advance Directive (No Data)  Does patient want to make changes to medical advance directive? No - Patient declined  Copy of [place] in Chart? No - copy requested  Pre-existing out of facility DNR order (yellow form or pink MOST form) -     Chief Complaint  Patient presents with  . Hospitalization Follow-up    Hospitalization F/U Visit  Status post CVA with left-sided weakness  HPI:  Patient is a 76 year old male with a previous history of liver cirrhosis status post liver transplant-diabetes type 2-hypertension-chronic kidney disease-hypothyroidism.  He presented to the hospital with acute left leg weakness MRI showed a 1 cm acute infarct in the right corona radiata  He was evaluated by neurology and aspirin was increased up to 325 mg a day with recommendation for strict control of his blood pressure diabetes and dyslipidemia.  In regards to diabetes he was kept on his current occasions of Lantus 32 units daily as well as NovoLog sliding scale-A1c at 6.8 in the hospital actually showed improvement from previous hemoglobin A1c of 7.4 back in May.  There is a hypertension he continues on  lisinopril  Regards chronic kidney disease creatinine was 1.04 BUN of 22 on discharge from hospital--per review of creatinines over the past year appears his baseline is about 1.3  He also has a history of liver transplant secondary to cirrhosis C is on Prograf.  In regards hypothyroidism he is on Synthroid most recent TSH I and appendectomy was in May 2018 TSH was mildly elevated at 5.54 free T4 was within normal range at 0.98  He does state at one point he was on magnesium supplementation secondary to low magnesium level-apparently he did not receive this in the hospital we will need to recheck a magnesium level  Today he has no complaints  is actually in the room visiting with family and friends is not complaining of any shortness breath or chest pain he is able to ambulate with a walker and says his left-sided strength has improved since the CVA       Past Medical History:  Diagnosis Date  . Anemia   . Biliary calculi, common bile duct   . CRI (chronic renal insufficiency)   . DM (diabetes mellitus) ([place])    Type II  . Elevated liver enzymes   . Esophageal varices ([facility]) 03/19/11   mrcp  . History of ERCP 03/21/2011   Extrahepatic biliary obstruction secondary to occluded biliary stent and colon no cholelithiasis, status post sphincterotomy, stent removal and replacement and stone extraction.  Marland Kitchen HTN (hypertension)   . Hypothyroidism   . NASH (nonalcoholic steatohepatitis) 2006   liver transplant  . Pancreatitis chronic 03/19/11   mrcp  . S/P colonoscopy with polypectomy 2004   Dr Gala Romney  . S/P  endoscopy 03/2004   Hx esophgaeal varices, pyloric channel ulcer prior to transplant  . Squamous cell cancer of external ear    pinna   Past Surgical History:  Procedure Laterality Date  . BILE DUCT STENT PLACEMENT  03/21/11   Dr. Gala Romney  . CHOLECYSTECTOMY  2006  . COLONOSCOPY  08/12/2002   Dr. Gala Romney- somewhat diffusely friable rectum and colon, polyp at 30 cm.  . COLONOSCOPY N/A  09/08/2012   Procedure: COLONOSCOPY;  Surgeon: Daneil Dolin, MD;  [facility];  Service: Endoscopy;  Laterality: N/A;  10:30  . ERCP  03/21/2011   Dr. Gala Romney- ERCP with removal of pediatric feeding tube, endoscopic sphinecterotomy with balloon dilation of ampulla followed by stone extraction and stent placement  . ESOPHAGOGASTRODUODENOSCOPY  03/20/2004   Dr. Nash Mantis 2 esophageal varices o/w normal esophageal mucosa, mild changes of snake skin in the gastric mucosa consistent with portal gastropathy, multiple antral erosions  and  a single pyloric channel ulcer benign appearance o/w normal gastric mucosa, normal D1 and S2  . KNEE ARTHROSCOPY WITH EXCISION PLICA Left 02/16/5725   Procedure: KNEE ARTHROSCOPY WITH EXCISION PLICA;  Surgeon: Carole Civil, MD;  Location: [facility] ORS;  Service: Orthopedics;  Laterality: Left;  . KNEE ARTHROSCOPY WITH MEDIAL MENISECTOMY Left 11/21/2012   Procedure: KNEE ARTHROSCOPY WITH MEDIAL AND LATERAL MENISECTOMY;  Surgeon: Carole Civil, MD;  Location: [facility];  Service: Orthopedics;  Laterality: Left;  . LIVER TRANSPLANT  2006   UVA-NASH cirrhosis  . MOUTH SURGERY  may 2012  . MRCP  03/19/11   biliary ductal dilatation- stones. esophageal varices  . SPHINCTEROTOMY  03/21/2011   Procedure: SPHINCTEROTOMY;  Surgeon: Daneil Dolin, MD;  Location: [facility];  Service: Gastroenterology;;    Allergies  Allergen Reactions  . Contrast Media [Iodinated Diagnostic Agents] Rash    Outpatient Encounter Prescriptions as of 04/17/2017  Medication Sig  . aspirin 325 MG tablet Take 1 tablet (325 mg total) by mouth daily.  Marland Kitchen docusate sodium (COLACE) 100 MG capsule Take 100 mg by mouth at bedtime.  . insulin aspart (NOVOLOG FLEXPEN) 100 UNIT/ML FlexPen Give 12 units if blood sugar is 180 or greater. If blood sugar is below 180 do not give novolog. Give three times a day.  . Insulin Glargine (LANTUS SOLOSTAR) 100 UNIT/ML Solostar Pen Inject 32 Units into the skin  daily at 10 pm.  . levothyroxine (SYNTHROID, LEVOTHROID) 137 MCG tablet Take 1 tablet (137 mcg total) by mouth daily before breakfast.  . lisinopril (PRINIVIL,ZESTRIL) 20 MG tablet Take 20 mg by mouth daily.   . Multiple Vitamins-Minerals (MULTIVITAMIN WITH MINERALS) tablet Take 1 tablet by mouth daily.    Marland Kitchen oxyCODONE-acetaminophen (PERCOCET/ROXICET) 5-325 MG tablet Take 1 tablet by mouth every 4 (four) hours as needed.  Marland Kitchen Specialty Vitamins Products (MAGNESIUM, AMINO ACID CHELATE,) 133 MG tablet Take 1 tablet by mouth 2 (two) times daily. Magnesium 133mg    . tacrolimus (PROGRAF) 1 MG capsule Take 1 mg by mouth 2 (two) times daily. 105:  Pt states he takes 1mg  bid with meals (which is a change from previous dose of 5mg  daily)  . [DISCONTINUED] insulin aspart (NOVOLOG) 100 UNIT/ML FlexPen Inject 12-18 Units into the skin 3 (three) times daily with meals. (Patient taking differently: Inject 12 Units into the skin 3 (three) times daily with meals. )  . [DISCONTINUED] predniSONE (DELTASONE) 10 MG tablet Take 5 mg by mouth as needed.    No facility-administered encounter medications on file as  of 04/17/2017.      Review of Systems   In general says he feels well does not complaining of any fever or chills.  Skin does not complain of rashes or itching.  Head ears eyes nose mouth and throat does not complaining any visual changes or sore throat.  Respiratory denies shortness breath or cough.  Cardiac denies chest pain does not appear to have significant lower extremity edema.  GI is not complaining of any abdominal discomfort nausea vomiting diarrhea constipation.  GU denies any dysuria.  Muscle skeletal is denying joint pain again has gained strength on his left side since the CVA.  Neurologic again does have some left-sided compared to the right but is ambulatory with a walker does not complain of any dizziness or headache or syncope.  Psych does not complaining of any depression or  anxiety appears to be in very good spirits.    Immunization History  Administered Date(s) Administered  . Influenza Split 02/13/2011, 04/29/2012, 04/20/2013, 05/09/2013  . Influenza, High Dose Seasonal PF 04/26/2015, 04/13/2017  . Influenza-Unspecified 04/08/2013  . Pneumococcal Conjugate-13 07/29/2014  . Pneumococcal Polysaccharide-23 03/17/2010, 04/16/2011   Pertinent  Health Maintenance Due  Topic Date Due  . FOOT EXAM  05/18/2017 (Originally 04/05/1951)  . OPHTHALMOLOGY EXAM  05/18/2017 (Originally 04/05/1951)  . URINE MICROALBUMIN  05/18/2017 (Originally 04/05/1951)  . PNA vac Low Risk Adult (2 of 2 - PCV13) 05/18/2017 (Originally 03/18/2011)  . HEMOGLOBIN A1C  10/12/2017  . INFLUENZA VACCINE  Completed   Fall Risk  01/28/2017 09/28/2016 06/29/2016 12/28/2015 09/21/2015  Falls in the past year? No No No No No   Functional Status Survey:    Vitals:   04/17/17 1043  BP: (!) 152/93  Pulse: 62  Resp: 20  Temp: (!) 97.2 F (36.2 C)  TempSrc: Oral    Physical Exam   In general this is a very pleasant elderly male who looks somewhat younger than his stated age.  His skin is warm and dry.  Eyes pupils appear reactive light sclerae and conjunctivae clear visual acuity appears grossly intact.  Oropharynx is clear mucous membranes moist.  Chest is clear to auscultation there is no labored breathing.  Heart is regular rate and rhythm without murmur gallop or rub he has quite minimal lower extremity edema pedal pulses are somewhat reduced bilaterally.  Abdomen is somewhat obese soft nontender with positive bowel sounds.  Muscle skeletal does move all extremities 4 has slight strength deficits in the left leg and arm compared to the right however he is able to stand without any assistance and uses a walker appears to be ambulating fairly well again with quite mild left-sided weakness.  Neurologic as noted above continues with some mild left-sided deficits cranial nerves are  grossly intact his speech is clear he is alert.  Psych he is alert and oriented very pleasant and appropriate  Labs reviewed:  Recent Labs  11/12/16 0823 04/12/17 0450 04/12/17 0507 04/14/17 0434  NA 139 141 143 140  K 4.7 4.2 4.2 3.9  CL 104 108 105 110  CO2 25 26  --  24  GLUCOSE 149* 142* 141* 115*  BUN 19 29* 30* 22*  CREATININE 1.36* 1.31* 1.40* 1.04  CALCIUM 9.2 9.0  --  8.5*    Recent Labs  08/13/16 0817 11/12/16 0823 04/12/17 0450  AST 20 15 19   ALT 15 15 18   ALKPHOS 75 76 66  BILITOT 0.8 0.6 1.0  PROT 6.8 7.4 7.3  ALBUMIN 3.9  4.0 3.6    Recent Labs  04/12/17 0450 04/12/17 0507  WBC 8.7  --   NEUTROABS 5.8  --   HGB 14.1 14.6  HCT 41.9 43.0  MCV 97.2  --   PLT 199  --    Lab Results  Component Value Date   TSH 5.540 (H) 11/12/2016   Lab Results  Component Value Date   HGBA1C 6.8 (H) 04/13/2017   Lab Results  Component Value Date   CHOL 121 04/13/2017   HDL 28 (L) 04/13/2017   LDLCALC 72 04/13/2017   TRIG 107 04/13/2017   CHOLHDL 4.3 04/13/2017    Significant Diagnostic Results in last 30 days:  No results found.  Assessment/Plan   #1-history of CVA-continues with some mild left-sided weakness his aspirin was increased up to 325 mg a day by neurology he will need continued PT and OT as well as neurology follow-up but appears to be doing well in this regards.  #2-history diabetes type 2 hemoglobin A1c was 6.8 on lab done in hospital he is on Lantus 32 units a day as well as NovoLog sliding scale at this point will monitor appears blood sugars have been in the 90s to 100s range so far.  #3 hypertension systolic is mildly elevated today in the 150s at this point will monitor since we have minimal readings he is on lisinopril 20 mg a day.-Patient states his blood pressure was well controlled in the hospital.  #4-history of chronic kidney disease creatinine of 1.04 on lab done October 7 is actually a bit improved from his baseline Will  monitor at periodic intervals.  #5-history hypothyroidism he is on Synthroid last TSH in May was mildly elevated but T4 was within normal range will update this.  #6-history of liver transplant he continues on Prograf-he does state that his magnesium has been low and apparently this was assessed by   GI and he was started on over-the-counter magnesium supplementation-however he apparently was not taking this in the hospital-will check a magnesium level-consider reinstituting the supplement pending those results-he says he has been on the magnesium supplement for an extended period of time.  #7 pain management he does have an order for Percocet when necessary point will monitor  Again will update a TSH free T4  magnesium level BMP and I are normal a-clinically he appears to be doing  well I suspect his stay here will be quite short.  [id number] note greater than 35 minutes spent assessing patient-reviewing his chart-reviewing his labs-discussing patient's status with him as well as with his wife at bedside-and coordinating and formulating a plan of care-of note greater than 50% of time spent coordinating plan of care

## 2017-04-18 ENCOUNTER — Non-Acute Institutional Stay (SKILLED_NURSING_FACILITY): Payer: PPO | Admitting: Internal Medicine

## 2017-04-18 ENCOUNTER — Encounter: Payer: Self-pay | Admitting: Internal Medicine

## 2017-04-18 ENCOUNTER — Encounter (HOSPITAL_COMMUNITY)
Admission: RE | Admit: 2017-04-18 | Discharge: 2017-04-18 | Disposition: A | Discharge status: Home / Self Care | Payer: PPO | Source: Skilled Nursing Facility | Attending: Internal Medicine | Admitting: Internal Medicine

## 2017-04-18 DIAGNOSIS — E039 Hypothyroidism, unspecified: Secondary | ICD-10-CM

## 2017-04-18 DIAGNOSIS — E1165 Type 2 diabetes mellitus with hyperglycemia: Secondary | ICD-10-CM | POA: Diagnosis not present

## 2017-04-18 DIAGNOSIS — Z944 Liver transplant status: Secondary | ICD-10-CM

## 2017-04-18 DIAGNOSIS — N182 Chronic kidney disease, stage 2 (mild): Secondary | ICD-10-CM | POA: Diagnosis not present

## 2017-04-18 DIAGNOSIS — Z794 Long term (current) use of insulin: Secondary | ICD-10-CM

## 2017-04-18 DIAGNOSIS — E118 Type 2 diabetes mellitus with unspecified complications: Secondary | ICD-10-CM

## 2017-04-18 DIAGNOSIS — I10 Essential (primary) hypertension: Secondary | ICD-10-CM

## 2017-04-18 DIAGNOSIS — I639 Cerebral infarction, unspecified: Secondary | ICD-10-CM | POA: Diagnosis not present

## 2017-04-18 DIAGNOSIS — IMO0002 Reserved for concepts with insufficient information to code with codable children: Secondary | ICD-10-CM

## 2017-04-18 LAB — MAGNESIUM: Magnesium: 1.7 mg/dL (ref 1.7–2.4)

## 2017-04-18 LAB — BASIC METABOLIC PANEL
Anion gap: 7 (ref 5–15)
BUN: 24 mg/dL — ABNORMAL HIGH (ref 6–20)
CALCIUM: 9.2 mg/dL (ref 8.9–10.3)
CO2: 27 mmol/L (ref 22–32)
Chloride: 104 mmol/L (ref 101–111)
Creatinine, Ser: 1.23 mg/dL (ref 0.61–1.24)
GFR calc Af Amer: >60 mL/min (ref >60)
GFR, EST NON AFRICAN AMERICAN: 55 mL/min — AB (ref >60)
GLUCOSE: 137 mg/dL — AB (ref 65–99)
Potassium: 4.5 mmol/L (ref 3.5–5.1)
Sodium: 138 mmol/L (ref 135–145)

## 2017-04-18 LAB — T4, FREE: FREE T4: 1.14 ng/dL — AB (ref 0.61–1.12)

## 2017-04-18 LAB — TSH: TSH: 2.535 u[IU]/mL (ref 0.350–4.500)

## 2017-04-18 NOTE — Progress Notes (Signed)
Provider: Veleta Miners  Location:   [facility] Room Number: 143/P Place of Service:  SNF (31)  PCP: Sharilyn Sites, MD Patient Care Team: Sharilyn Sites, MD as PCP - General (Family Medicine) Gala Romney Cristopher Estimable, MD (Gastroenterology)  Extended Emergency Contact Information Primary Emergency Contact: Wyona Almas Address: [address]          [place], [place] [address] Johnnette Litter of [place] Phone: [phone] Relation: Spouse Secondary Emergency Contact: Ginette Pitman States of [place] Phone: [phone] Mobile Phone: [phone] Relation: Daughter  Code Status: Full Code Goals of Care: Advanced Directive information Advanced Directives 04/18/2017  Does Patient Have a Medical Advance Directive? Yes  Type of Advance Directive (No Data)  Does patient want to make changes to medical advance directive? No - Patient declined  Copy of [place] in Chart? No - copy requested  Pre-existing out of facility DNR order (yellow form or pink MOST form) -      Chief Complaint  Patient presents with  . New Admit To SNF    New Admission Visit    HPI: Patient is a 76 y.o. male seen today for admission to SNF for therapy after sustaining Acute Right Lacunar infarct with Left sided weakness.  Patient has h/o Liver transplant, Diabetes Mellitus, Hypertension, CKD Stage 2 Hypothyroidism, Patient presented to ED with acute onset of Left Hemiparesis Affecting his face, Arm, and Leg and Dysarthria.  His MRI was consistent with Lacunar Infarct. He was admitted from 10/05-10/09. And was discharged on Higher Dose of aspirin. His Echo and Carotid dopplers were normal. His dose of aspirin was increased to 325 mg.  Patient is doing well in facility. He has started his therapy and is already walking with the walker.  He was very active before surgery. Driving and taking care of his wife. He is planning to go home after  discharge.  Past Medical History:  Diagnosis Date  . Anemia   . Biliary calculi, common bile duct   . CRI (chronic renal insufficiency)   . DM (diabetes mellitus) ([place])    Type II  . Elevated liver enzymes   . Esophageal varices ([place]) 03/19/11   mrcp  . History of ERCP 03/21/2011   Extrahepatic biliary obstruction secondary to occluded biliary stent and colon no cholelithiasis, status post sphincterotomy, stent removal and replacement and stone extraction.  Marland Kitchen HTN (hypertension)   . Hypothyroidism   . NASH (nonalcoholic steatohepatitis) 2006   liver transplant  . Pancreatitis chronic 03/19/11   mrcp  . S/P colonoscopy with polypectomy 2004   Dr Gala Romney  . S/P endoscopy 03/2004   Hx esophgaeal varices, pyloric channel ulcer prior to transplant  . Squamous cell cancer of external ear    pinna   Past Surgical History:  Procedure Laterality Date  . BILE DUCT STENT PLACEMENT  03/21/11   Dr. Gala Romney  . CHOLECYSTECTOMY  2006  . COLONOSCOPY  08/12/2002   Dr. Gala Romney- somewhat diffusely friable rectum and colon, polyp at 30 cm.  . COLONOSCOPY N/A 09/08/2012   Procedure: COLONOSCOPY;  Surgeon: Daneil Dolin, MD;  [facility];  Service: Endoscopy;  Laterality: N/A;  10:30  . ERCP  03/21/2011   Dr. Gala Romney- ERCP with removal of pediatric feeding tube, endoscopic sphinecterotomy with balloon dilation of ampulla followed by stone extraction and stent placement  . ESOPHAGOGASTRODUODENOSCOPY  03/20/2004   Dr. Nash Mantis 2 esophageal varices o/w normal esophageal mucosa, mild changes of snake skin in  the gastric mucosa consistent with portal gastropathy, multiple antral erosions  and  a single pyloric channel ulcer benign appearance o/w normal gastric mucosa, normal D1 and S2  . KNEE ARTHROSCOPY WITH EXCISION PLICA Left 08/02/5807   Procedure: KNEE ARTHROSCOPY WITH EXCISION PLICA;  Surgeon: Carole Civil, MD;  Location: [facility];  Service: Orthopedics;  Laterality: Left;  . KNEE ARTHROSCOPY WITH  MEDIAL MENISECTOMY Left 11/21/2012   Procedure: KNEE ARTHROSCOPY WITH MEDIAL AND LATERAL MENISECTOMY;  Surgeon: Carole Civil, MD;  Location: [facility];  Service: Orthopedics;  Laterality: Left;  . LIVER TRANSPLANT  2006   UVA-NASH cirrhosis  . MOUTH SURGERY  may 2012  . MRCP  03/19/11   biliary ductal dilatation- stones. esophageal varices  . SPHINCTEROTOMY  03/21/2011   Procedure: SPHINCTEROTOMY;  Surgeon: Daneil Dolin, MD;  Location: [facility];  Service: Gastroenterology;;    reports that he has never smoked. He has never used smokeless tobacco. He reports that he does not drink alcohol or use drugs. Social History   Social History  . Marital status: Married    Spouse name: N/A  . Number of children: 1  . Years of education: 39   Occupational History  . RETIRED   .  Retired   Social History Main Topics  . Smoking status: Never Smoker  . Smokeless tobacco: Never Used  . Alcohol use No  . Drug use: No  . Sexual activity: Not on file   Other Topics Concern  . Not on file   Social History Narrative  . No narrative on file    Functional Status Survey:    Family History  Problem Relation Age of Onset  . Cirrhosis Father        nash  . Lymphoma Mother   . Diabetes Daughter     Health Maintenance  Topic Date Due  . FOOT EXAM  05/18/2017 (Originally 04/05/1951)  . OPHTHALMOLOGY EXAM  05/18/2017 (Originally 04/05/1951)  . URINE MICROALBUMIN  05/18/2017 (Originally 04/05/1951)  . TETANUS/TDAP  05/18/2017 (Originally 04/04/1960)  . HEMOGLOBIN A1C  10/12/2017  . INFLUENZA VACCINE  Completed  . PNA vac Low Risk Adult  Completed    Allergies  Allergen Reactions  . Contrast Media [Iodinated Diagnostic Agents] Rash    Outpatient Encounter Prescriptions as of 04/18/2017  Medication Sig  . aspirin 325 MG tablet Take 1 tablet (325 mg total) by mouth daily.  Marland Kitchen docusate sodium (COLACE) 100 MG capsule Take 100 mg by mouth at bedtime.  . insulin aspart (NOVOLOG FLEXPEN) 100  UNIT/ML FlexPen Give 12 units if blood sugar is 180 or greater. If blood sugar is below 180 do not give novolog. Give three times a day.  . Insulin Glargine (LANTUS SOLOSTAR) 100 UNIT/ML Solostar Pen Inject 32 Units into the skin daily at 10 pm.  . levothyroxine (SYNTHROID, LEVOTHROID) 137 MCG tablet Take 1 tablet (137 mcg total) by mouth daily before breakfast.  . lisinopril (PRINIVIL,ZESTRIL) 20 MG tablet Take 20 mg by mouth daily.   . Multiple Vitamins-Minerals (MULTIVITAMIN WITH MINERALS) tablet Take 1 tablet by mouth daily.    Marland Kitchen oxyCODONE-acetaminophen (PERCOCET/ROXICET) 5-325 MG tablet Take 1 tablet by mouth every 4 (four) hours as needed.  Marland Kitchen Specialty Vitamins Products (MAGNESIUM, AMINO ACID CHELATE,) 133 MG tablet Take 1 tablet by mouth 2 (two) times daily. Magnesium 133mg    . tacrolimus (PROGRAF) 1 MG capsule Take 1 mg by mouth 2 (two) times daily. 105:  Pt states he takes 1mg  bid with  meals (which is a change from previous dose of 5mg  daily)   No facility-administered encounter medications on file as of 04/18/2017.      Review of Systems  Review of Systems  Constitutional: Negative for activity change, appetite change, chills, diaphoresis, fatigue and fever.  HENT: Negative for mouth sores, postnasal drip, rhinorrhea, sinus pain and sore throat.   Respiratory: Negative for apnea, cough, chest tightness, shortness of breath and wheezing.   Cardiovascular: Negative for chest pain, palpitations and leg swelling.  Gastrointestinal: Negative for abdominal distention, abdominal pain, constipation, diarrhea, nausea and vomiting.  Genitourinary: Negative for dysuria and frequency.  Musculoskeletal: Negative for arthralgias, joint swelling and myalgias.  Skin: Negative for rash.  Neurological: Negative for dizziness, syncope, weakness, light-headedness and numbness.  Psychiatric/Behavioral: Negative for behavioral problems, confusion and sleep disturbance.    Vitals:   04/18/17 1021   BP: (!) 153/97  Pulse: 78  Resp: 20  Temp: 97.7 F (36.5 C)  TempSrc: Oral  SpO2: 97%   There is no height or weight on file to calculate BMI. Physical Exam  Constitutional: He is oriented to person, place, and time. He appears well-developed and well-nourished.  HENT:  Head: Normocephalic.  Mouth/Throat: Oropharynx is clear and moist.  Eyes: Pupils are equal, round, and reactive to light.  Neck: Neck supple.  Cardiovascular: Normal rate and normal heart sounds.   No murmur heard. Pulmonary/Chest: Effort normal and breath sounds normal. No respiratory distress. He has no wheezes. He has no rales.  Abdominal: Soft. Bowel sounds are normal. He exhibits no distension. There is no tenderness. There is no rebound.  Musculoskeletal:  Trace edema  Neurological: He is alert and oriented to person, place, and time.  Has good strength in all extremities. Except 4/5 in Left Lower Extremity  Skin: Skin is warm and dry.  Psychiatric: He has a normal mood and affect. His behavior is normal. Thought content normal.    Labs reviewed: Basic Metabolic Panel:  Recent Labs  04/12/17 0450 04/12/17 0507 04/14/17 0434 04/18/17 0700  NA 141 143 140 138  K 4.2 4.2 3.9 4.5  CL 108 105 110 104  CO2 26  --  24 27  GLUCOSE 142* 141* 115* 137*  BUN 29* 30* 22* 24*  CREATININE 1.31* 1.40* 1.04 1.23  CALCIUM 9.0  --  8.5* 9.2   Liver Function Tests:  Recent Labs  08/13/16 0817 11/12/16 0823 04/12/17 0450  AST 20 15 19   ALT 15 15 18   ALKPHOS 75 76 66  BILITOT 0.8 0.6 1.0  PROT 6.8 7.4 7.3  ALBUMIN 3.9 4.0 3.6   No results for input(s): LIPASE, AMYLASE in the last 8760 hours. No results for input(s): AMMONIA in the last 8760 hours. CBC:  Recent Labs  04/12/17 0450 04/12/17 0507  WBC 8.7  --   NEUTROABS 5.8  --   HGB 14.1 14.6  HCT 41.9 43.0  MCV 97.2  --   PLT 199  --    Cardiac Enzymes:  Recent Labs  04/12/17 0450  TROPONINI <0.03   BNP: Invalid input(s):  POCBNP Lab Results  Component Value Date   HGBA1C 6.8 (H) 04/13/2017   Lab Results  Component Value Date   TSH 2.535 04/18/2017   No results found for: VITAMINB12 No results found for: FOLATE No results found for: IRON, TIBC, FERRITIN  Imaging and Procedures obtained prior to SNF admission: No results found.  Assessment/Plan Cerebrovascular accident  Patient doing well. Continue therapy. On Aspirin LDL  was 72 in hospital Not on any statin. Neurology also recommended for Sleep study as out patient for risk modification.   Essential hypertension BP Mildly Elevated Patient says his BP is controlled on Lisinopril. Will continue to monitor for now.  Diabetes Mellitus Type 2 A1C was 6.8 in hospital. On Lantus and Novolog with the meals. D/w patient about his regime and try to maintain it closely in facility.  Hypothyroidism TSH Normal in Hospital Same dose of Synthroid  CKD (chronic kidney disease), stage II Creat stable and at baseline Liver replaced by transplant  Avoid Acetaminophen  Continue Prograf     Family/ staff Communication:   Labs/tests ordered: CBC ,CMP in 1 week. Total time spent in this patient care encounter was _45 minutes; greater than 50% of the visit spent counseling patient, reviewing records , Labs and coordinating care for problems addressed at this encounter.

## 2017-04-25 ENCOUNTER — Encounter (HOSPITAL_COMMUNITY)
Admission: RE | Admit: 2017-04-25 | Discharge: 2017-04-25 | Disposition: A | Discharge status: Home / Self Care | Payer: PPO | Source: Skilled Nursing Facility | Attending: Internal Medicine | Admitting: Internal Medicine

## 2017-04-25 DIAGNOSIS — Z5189 Encounter for other specified aftercare: Secondary | ICD-10-CM | POA: Insufficient documentation

## 2017-04-25 DIAGNOSIS — E118 Type 2 diabetes mellitus with unspecified complications: Secondary | ICD-10-CM | POA: Insufficient documentation

## 2017-04-25 DIAGNOSIS — Z944 Liver transplant status: Secondary | ICD-10-CM | POA: Insufficient documentation

## 2017-04-25 DIAGNOSIS — I639 Cerebral infarction, unspecified: Secondary | ICD-10-CM | POA: Insufficient documentation

## 2017-04-25 DIAGNOSIS — E039 Hypothyroidism, unspecified: Secondary | ICD-10-CM | POA: Insufficient documentation

## 2017-04-25 LAB — CBC
HCT: 44.5 % (ref 39.0–52.0)
Hemoglobin: 14.6 g/dL (ref 13.0–17.0)
MCH: 32.2 pg (ref 26.0–34.0)
MCHC: 32.8 g/dL (ref 30.0–36.0)
MCV: 98 fL (ref 78.0–100.0)
PLATELETS: 206 10*3/uL (ref 150–400)
RBC: 4.54 MIL/uL (ref 4.22–5.81)
RDW: 13.1 % (ref 11.5–15.5)
WBC: 8.3 10*3/uL (ref 4.0–10.5)

## 2017-04-25 LAB — COMPREHENSIVE METABOLIC PANEL
ALBUMIN: 3.5 g/dL (ref 3.5–5.0)
ALT: 25 U/L (ref 17–63)
ANION GAP: 6 (ref 5–15)
AST: 21 U/L (ref 15–41)
Alkaline Phosphatase: 72 U/L (ref 38–126)
BUN: 29 mg/dL — AB (ref 6–20)
CHLORIDE: 105 mmol/L (ref 101–111)
CO2: 27 mmol/L (ref 22–32)
Calcium: 9.1 mg/dL (ref 8.9–10.3)
Creatinine, Ser: 1.29 mg/dL — ABNORMAL HIGH (ref 0.61–1.24)
GFR calc Af Amer: >60 mL/min (ref >60)
GFR calc non Af Amer: 52 mL/min — ABNORMAL LOW (ref >60)
GLUCOSE: 176 mg/dL — AB (ref 65–99)
POTASSIUM: 4.4 mmol/L (ref 3.5–5.1)
Sodium: 138 mmol/L (ref 135–145)
Total Bilirubin: 1 mg/dL (ref 0.3–1.2)
Total Protein: 7.2 g/dL (ref 6.5–8.1)

## 2017-04-30 ENCOUNTER — Non-Acute Institutional Stay (SKILLED_NURSING_FACILITY): Payer: PPO | Admitting: Internal Medicine

## 2017-04-30 ENCOUNTER — Encounter: Payer: Self-pay | Admitting: Internal Medicine

## 2017-04-30 DIAGNOSIS — I639 Cerebral infarction, unspecified: Secondary | ICD-10-CM

## 2017-04-30 DIAGNOSIS — M545 Low back pain, unspecified: Secondary | ICD-10-CM

## 2017-04-30 DIAGNOSIS — I10 Essential (primary) hypertension: Secondary | ICD-10-CM

## 2017-04-30 DIAGNOSIS — IMO0002 Reserved for concepts with insufficient information to code with codable children: Secondary | ICD-10-CM

## 2017-04-30 DIAGNOSIS — E1165 Type 2 diabetes mellitus with hyperglycemia: Secondary | ICD-10-CM | POA: Diagnosis not present

## 2017-04-30 DIAGNOSIS — E118 Type 2 diabetes mellitus with unspecified complications: Secondary | ICD-10-CM

## 2017-04-30 DIAGNOSIS — E119 Type 2 diabetes mellitus without complications: Secondary | ICD-10-CM | POA: Diagnosis not present

## 2017-04-30 DIAGNOSIS — Z794 Long term (current) use of insulin: Secondary | ICD-10-CM

## 2017-04-30 DIAGNOSIS — H2513 Age-related nuclear cataract, bilateral: Secondary | ICD-10-CM | POA: Diagnosis not present

## 2017-04-30 NOTE — Progress Notes (Signed)
Location:   [facility] Room Number: 143/P Place of Service:  SNF [phone]) Provider:  Lilia Pro, MD  Patient Care Team: Sharilyn Sites, MD as PCP - General (Family Medicine) Gala Romney Cristopher Estimable, MD (Gastroenterology)  Extended Emergency Contact Information Primary Emergency Contact: Wyona Almas Address: [address]          [place], [place] [address] Johnnette Litter of [place] Phone: [phone] Relation: Spouse Secondary Emergency Contact: Ginette Pitman [place] of [place] Phone: [phone] Mobile Phone: [phone] Relation: Daughter  Code Status:  Full Code Goals of care: Advanced Directive information Advanced Directives 04/30/2017  Does Patient Have a Medical Advance Directive? Yes  Type of Advance Directive (No Data)  Does patient want to make changes to medical advance directive? No - Patient declined  Copy of [place] in Chart? No - copy requested  Pre-existing out of facility DNR order (yellow form or pink MOST form) -     Chief Complaint  Patient presents with  . Acute Visit    Back Pain    HPI:  Pt is a 76 y.o. male seen today for an acute visit for Complaints of low back pain.  Patient has a history of liver transplant as well as diabetes hypertension hypothyroidism as well as chronic kidney disease stage II.  He is here for rehabilitation after sustaining a CVA with left-sided hemiparalysis in her neck and dysarthria.  He actually has done quite well in [place] strength deficit now. Leg.  He is participating with therapy.  Apparently since he's been here he is complaining of some increased low back pain-he attributes this to the mattress-in fact today therapy was somewhat hindered because of the pain -this pain appears to be more in his lower right back he states when he got out of bed this morning it was fairly tense-she says this is more affected with movement and when he is  resting the pain is minimized.  He does not complain of any radiation or numbness says this is more or less right lower back pain affected by movement.  Apparently there's been no history of trauma here falls etc.  Vital signs are stable at this afternoon systolic is somewhat elevated in the 150s this could be do secondary to pain previous blood pressure was 127/79.  He is also a type II diabetic he is on Lantus 32 units as well as a fairly aggressive NovoLog sliding scale regular 12 units for blood sugar between 100 and 150-morning blood sugars appear to be stable largely in the lower to mid 100s-later in the day has some variability ranging from 80s to low 100s generally although I do see one that is 68 and another 58-speaking with patient he states the lower readings usually happen when he is given his insulin and then he does not eat until an hour or hour and a half later.  Currently again main complaint is back pain does not complain of any chest pain radiation of this pain fever or chills shortness of breath     Past Medical History:  Diagnosis Date  . Anemia   . Biliary calculi, common bile duct   . CRI (chronic renal insufficiency)   . DM (diabetes mellitus) ([place])    Type II  . Elevated liver enzymes   . Esophageal varices ([place]) 03/19/11   mrcp  . History of ERCP 03/21/2011   Extrahepatic biliary obstruction secondary to occluded biliary stent and colon  no cholelithiasis, status post sphincterotomy, stent removal and replacement and stone extraction.  Marland Kitchen HTN (hypertension)   . Hypothyroidism   . NASH (nonalcoholic steatohepatitis) 2006   liver transplant  . Pancreatitis chronic 03/19/11   mrcp  . S/P colonoscopy with polypectomy 2004   Dr Gala Romney  . S/P endoscopy 03/2004   Hx esophgaeal varices, pyloric channel ulcer prior to transplant  . Squamous cell cancer of external ear    pinna   Past Surgical History:  Procedure Laterality Date  . BILE DUCT STENT PLACEMENT  03/21/11     Dr. Gala Romney  . CHOLECYSTECTOMY  2006  . COLONOSCOPY  08/12/2002   Dr. Gala Romney- somewhat diffusely friable rectum and colon, polyp at 30 cm.  . COLONOSCOPY N/A 09/08/2012   Procedure: COLONOSCOPY;  Surgeon: Daneil Dolin, MD;  Location: [facility];  Service: Endoscopy;  Laterality: N/A;  10:30  . ERCP  03/21/2011   Dr. Gala Romney- ERCP with removal of pediatric feeding tube, endoscopic sphinecterotomy with balloon dilation of ampulla followed by stone extraction and stent placement  . ESOPHAGOGASTRODUODENOSCOPY  03/20/2004   Dr. [place] Mantis 2 esophageal varices o/w normal esophageal mucosa, mild changes of snake skin in the gastric mucosa consistent with portal gastropathy, multiple antral erosions  and  a single pyloric channel ulcer benign appearance o/w normal gastric mucosa, normal D1 and S2  . KNEE ARTHROSCOPY WITH EXCISION PLICA Left 1/66/0630   Procedure: KNEE ARTHROSCOPY WITH EXCISION PLICA;  Surgeon: Carole Civil, MD;  Location: [facility]S;  Service: Orthopedics;  Laterality: Left;  . KNEE ARTHROSCOPY WITH MEDIAL MENISECTOMY Left 11/21/2012   Procedure: KNEE ARTHROSCOPY WITH MEDIAL AND LATERAL MENISECTOMY;  Surgeon: Carole Civil, MD;  Location: [facility];  Service: Orthopedics;  Laterality: Left;  . LIVER TRANSPLANT  2006   UVA-NASH cirrhosis  . MOUTH SURGERY  may 2012  . MRCP  03/19/11   biliary ductal dilatation- stones. esophageal varices  . SPHINCTEROTOMY  03/21/2011   Procedure: SPHINCTEROTOMY;  Surgeon: Daneil Dolin, MD;  Location: [facility];  Service: Gastroenterology;;    Allergies  Allergen Reactions  . Contrast Media [Iodinated Diagnostic Agents] Rash    Outpatient Encounter Prescriptions as of 04/30/2017  Medication Sig  . aspirin 325 MG tablet Take 1 tablet (325 mg total) by mouth daily.  Marland Kitchen docusate sodium (COLACE) 100 MG capsule Take 100 mg by mouth at bedtime.  . insulin aspart (NOVOLOG FLEXPEN) 100 UNIT/ML FlexPen Give per sliding scale three times a day  . Insulin  Glargine (LANTUS SOLOSTAR) 100 UNIT/ML Solostar Pen Inject 32 Units into the skin daily at 10 pm.  . levothyroxine (SYNTHROID, LEVOTHROID) 137 MCG tablet Take 1 tablet (137 mcg total) by mouth daily before breakfast.  . lisinopril (PRINIVIL,ZESTRIL) 20 MG tablet Take 20 mg by mouth daily.   . Multiple Vitamins-Minerals (MULTIVITAMIN WITH MINERALS) tablet Take 1 tablet by mouth daily.    Marland Kitchen oxyCODONE-acetaminophen (PERCOCET/ROXICET) 5-325 MG tablet Take 1 tablet by mouth every 4 (four) hours as needed.  Marland Kitchen Specialty Vitamins Products (MAGNESIUM, AMINO ACID CHELATE,) 133 MG tablet Take 1 tablet by mouth 2 (two) times daily. Magnesium 133mg    . tacrolimus (PROGRAF) 1 MG capsule Take 1 mg by mouth 2 (two) times daily. 105:  Pt states he takes 1mg  bid with meals (which is a change from previous dose of 5mg  daily)   No facility-administered encounter medications on file as of 04/30/2017.     Review of Systems   In general is not complaining  of any fever or chills.  Skin does not complain of rashes or itching.  Head ears eyes nose mouth and throat does not complaining of any sore throat or visual changes.  Respiratory denies shortness of breath or cough.  Cardiac does not complaining of chest pain or significant lower extremity edema.  GI does not complain of abdominal discomfort nausea vomiting diarrhea constipation  Muscle skeletal. Skeletal does complain of low back pain more so in the lower right back as noted above. With movement  Neurologic does not complaining any syncope numbness or headache.  Psych does not complain of any anxiety or depression appears to be in good spirits somewhat frustrated with his back pain  Immunization History  Administered Date(s) Administered  . Influenza Split 02/13/2011, 04/29/2012, 04/20/2013, 05/09/2013  . Influenza, High Dose Seasonal PF 04/26/2015, 04/13/2017  . Influenza-Unspecified 04/08/2013  . Pneumococcal Conjugate-13 07/29/2014  .  Pneumococcal Polysaccharide-23 03/17/2010, 04/16/2011   Pertinent  Health Maintenance Due  Topic Date Due  . FOOT EXAM  05/18/2017 (Originally 04/05/1951)  . OPHTHALMOLOGY EXAM  05/18/2017 (Originally 04/05/1951)  . URINE MICROALBUMIN  05/18/2017 (Originally 04/05/1951)  . HEMOGLOBIN A1C  10/12/2017  . INFLUENZA VACCINE  Completed  . PNA vac Low Risk Adult  Completed   Fall Risk  01/28/2017 09/28/2016 06/29/2016 12/28/2015 09/21/2015  Falls in the past year? No No No No No   Functional Status Survey:  W 97.5 pulse 76 respirations 20 blood pressure 128/84-later this afternoon 154/94 when he was having some discomfort O2 stat is 95% on room air  Physical Exam   In general this is a pleasant elderly male in no distress lying in his recline  he does not appear to be in any distress but says with movement he does have back pain.  His skin is warm and dry.  Chest is clear to auscultation there is no labored breathing.  Heart is regular rate and rhythm without murmur gallop or rub.  Abdomen is obese soft nontender with positive bowel sounds.  Muscle skeletal is able to move all extremities 4 does have some mild deficits in his left lower extremity strength appears to be strong bilaterally area  I do not note any deformity of the back but there is tenderness to palpation of the right lower back area I do not note any edema or erythema.  Neurologic is grossly intact again he does have some mild left leg weakness compared to the right grip strength again appears to be strong touch sensation is intact lower extremities bilaterally.  Psych he is alert and oriented pleasant and appropriate Labs reviewed:  Recent Labs  04/14/17 0434 04/18/17 0700 04/25/17 0700  NA 140 138 138  K 3.9 4.5 4.4  CL 110 104 105  CO2 24 27 27   GLUCOSE 115* 137* 176*  BUN 22* 24* 29*  CREATININE 1.04 1.23 1.29*  CALCIUM 8.5* 9.2 9.1  MG  --  1.7  --     Recent Labs  11/12/16 0823 04/12/17 0450  04/25/17 0700  AST 15 19 21   ALT 15 18 25   ALKPHOS 76 66 72  BILITOT 0.6 1.0 1.0  PROT 7.4 7.3 7.2  ALBUMIN 4.0 3.6 3.5    Recent Labs  04/12/17 0450 04/12/17 0507 04/25/17 0700  WBC 8.7  --  8.3  NEUTROABS 5.8  --   --   HGB 14.1 14.6 14.6  HCT 41.9 43.0 44.5  MCV 97.2  --  98.0  PLT 199  --  206   Lab Results  Component Value Date   TSH 2.535 04/18/2017   Lab Results  Component Value Date   HGBA1C 6.8 (H) 04/13/2017   Lab Results  Component Value Date   CHOL 121 04/13/2017   HDL 28 (L) 04/13/2017   LDLCALC 72 04/13/2017   TRIG 107 04/13/2017   CHOLHDL 4.3 04/13/2017    Significant Diagnostic Results in last 30 days:  Mr Brain Wo Contrast  Result Date: 04/12/2017 CLINICAL DATA:  Ataxia, stroke suspected. EXAM: MRI HEAD WITHOUT CONTRAST MRA HEAD WITHOUT CONTRAST TECHNIQUE: Multiplanar, multiecho pulse sequences of the brain and surrounding structures were obtained without intravenous contrast. Angiographic images of the head were obtained using MRA technique without contrast. COMPARISON:  Head CT from earlier today FINDINGS: MRI HEAD FINDINGS Brain: 1 cm focus of restricted diffusion in the posterior right corona radiata. Overall mild chronic small vessel ischemic change in the cerebral white matter. There has been a remote small vessel infarct in the left corona radiata. Age normal brain volume. No mass, hemorrhage, or hydrocephalus. Vascular: Arterial findings below. Normal dural venous sinus flow voids. Skull and upper cervical spine: Negative for marrow lesion. Sinuses/Orbits: Negative MRA HEAD FINDINGS Motion degraded. No branch occlusion or flow limiting stenosis seen in the anterior circulation. There is mild narrowing of the right ICA at the anterior genu. Codominant vertebral arteries. Mild left V4 segment narrowing. There is a high-grade proximal right P2 segment stenosis with less intense downstream flow. No flow seen beyond the mid left P2 segment. Negative for  aneurysm. IMPRESSION: Brain MRI: 1. 1 cm acute infarct in the right corona radiata. 2. Mild chronic small vessel ischemia in the cerebral white matter. Intracranial MRA: 1. Motion degraded. 2. Intracranial atherosclerosis with high-grade proximal right and mid left P2 segment stenoses. 3. Mild left V4 segment narrowing. Electronically Signed   By: [place] Fantasia M.D.   On: 04/12/2017 07:22   US Carotid Bilateral (at [facility] Only)  Result Date: 04/12/2017 CLINICAL DATA:  76 year old male with a history of CVA. Cardiovascular risk factors include hypertension, known prior stroke/TIA, hyperlipidemia, diabetes EXAM: BILATERAL CAROTID DUPLEX ULTRASOUND TECHNIQUE: Pearline Cables scale imaging, color Doppler and duplex ultrasound were performed of bilateral carotid and vertebral arteries in the neck. COMPARISON:  No prior duplex FINDINGS: Criteria: Quantification of carotid stenosis is based on velocity parameters that correlate the residual internal carotid diameter with NASCET-based stenosis levels, using the diameter of the distal internal carotid lumen as the denominator for stenosis measurement. The following velocity measurements were obtained: RIGHT ICA:  Systolic 61 cm/sec, Diastolic 25 cm/sec CCA:  87 cm/sec SYSTOLIC ICA/CCA RATIO:  0.7 ECA:  80 set cm/sec LEFT ICA:  Systolic 55 cm/sec, Diastolic 11 cm/sec CCA:  94 cm/sec SYSTOLIC ICA/CCA RATIO:  0.6 ECA:  133 cm/sec Right Brachial SBP: Not acquired Left Brachial SBP: Not acquired RIGHT CAROTID ARTERY: No significant calcifications of the right common carotid artery. Intermediate waveform maintained. Heterogeneous and partially calcified plaque at the right carotid bifurcation. No significant lumen shadowing. Low resistance waveform of the right ICA. Tortuosity RIGHT VERTEBRAL ARTERY: Antegrade flow with low resistance waveform. LEFT CAROTID ARTERY: No significant calcifications of the left common carotid artery. Intermediate waveform maintained. Heterogeneous and  partially calcified plaque at the left carotid bifurcation without significant lumen shadowing. Low resistance waveform of the left ICA. tortuosity. LEFT VERTEBRAL ARTERY:  Antegrade flow with low resistance waveform. IMPRESSION: Color duplex indicates minimal heterogeneous and calcified plaque, with no hemodynamically significant stenosis by  duplex criteria in the extracranial cerebrovascular circulation. Signed, Dulcy Fanny. Earleen Newport, DO [facility] Electronically Signed   By: Corrie Mckusick D.O.   On: 04/12/2017 10:57   Mr Jodene Nam Head/brain FU Cm  Result Date: 04/12/2017 CLINICAL DATA:  Ataxia, stroke suspected. EXAM: MRI HEAD WITHOUT CONTRAST MRA HEAD WITHOUT CONTRAST TECHNIQUE: Multiplanar, multiecho pulse sequences of the brain and surrounding structures were obtained without intravenous contrast. Angiographic images of the head were obtained using MRA technique without contrast. COMPARISON:  Head CT from earlier today FINDINGS: MRI HEAD FINDINGS Brain: 1 cm focus of restricted diffusion in the posterior right corona radiata. Overall mild chronic small vessel ischemic change in the cerebral white matter. There has been a remote small vessel infarct in the left corona radiata. Age normal brain volume. No mass, hemorrhage, or hydrocephalus. Vascular: Arterial findings below. Normal dural venous sinus flow voids. Skull and upper cervical spine: Negative for marrow lesion. Sinuses/Orbits: Negative MRA HEAD FINDINGS Motion degraded. No branch occlusion or flow limiting stenosis seen in the anterior circulation. There is mild narrowing of the right ICA at the anterior genu. Codominant vertebral arteries. Mild left V4 segment narrowing. There is a high-grade proximal right P2 segment stenosis with less intense downstream flow. No flow seen beyond the mid left P2 segment. Negative for aneurysm. IMPRESSION: Brain MRI: 1. 1 cm acute infarct in the right corona  radiata. 2. Mild chronic small vessel ischemia in the cerebral white matter. Intracranial MRA: 1. Motion degraded. 2. Intracranial atherosclerosis with high-grade proximal right and mid left P2 segment stenoses. 3. Mild left V4 segment narrowing. Electronically Signed   By: [place] Fantasia M.D.   On: 04/12/2017 07:22   Ct Head Code Stroke Wo Contrast  Result Date: 04/12/2017 CLINICAL DATA:  Code stroke. Acute onset LEFT-sided weakness. History of hypertension, diabetes. EXAM: CT HEAD WITHOUT CONTRAST TECHNIQUE: Contiguous axial images were obtained from the base of the skull through the vertex without intravenous contrast. COMPARISON:  CT HEAD November 03, 2004 FINDINGS: BRAIN: No intraparenchymal hemorrhage, mass effect nor midline shift. The ventricles and sulci are normal for age. Patchy supratentorial white matter hypodensities within normal range for patient's age, though non-specific are most compatible with chronic small vessel ischemic disease. Old LEFT basal ganglia and LEFT thalamus lacunar infarcts. No acute large vascular territory infarcts. No abnormal extra-axial fluid collections. Basal cisterns are patent. VASCULAR: Moderate to severe calcific atherosclerosis of the carotid siphons. Tiny densities along bilateral middle cerebral artery's compatible with atherosclerosis. SKULL: No skull fracture. No significant scalp soft tissue swelling. SINUSES/ORBITS: The mastoid air-cells and included paranasal sinuses are well-aerated.The included ocular globes and orbital contents are non-suspicious. OTHER: None. ASPECTS [facility] Stroke Program Early CT Score) - Ganglionic level infarction (caudate, lentiform nuclei, internal capsule, insula, M1-M3 cortex): 7 - Supraganglionic infarction (M4-M6 cortex): 3 Total score (0-10 with 10 being normal): 10 IMPRESSION: 1. Negative noncontrast CT HEAD for age. 2. ASPECTS is 10. 3. Critical Value/emergent results were called by telephone at the time of interpretation on  04/12/2017 at 5:10 am to Dr. Rolland Porter , who verbally acknowledged these results. Electronically Signed   By: Elon Alas M.D.   On: 04/12/2017 05:11    Assessment/Plan  #1-history of back pain-this appears to be more movement related with tenderness to palpation of the right lower back we will order an x-ray of the area also will start Robaxin 5 mg every 6 hours when necessary to see if this will give some  relief this could be more a muscle issue but will obtain the x-ray and monitor clinically he does receive Percocet as needed for pain as well.  #2 diabetes-he has fairly stable blood sugars occasionally somewhat more readings I suspect this is due to the timing of his insulin and then eating meals and have written order to not give his NovoLog insulin except within 30 minutes of eating-I also spoke with nursing about this and they say they have given orders to not give him insulin until tray actually is in  Room   #3 hypertension systolic and diastolic somewhat elevated today but he is having some discomfort will monitor for now I do not see consistent elevations he is on lisinopril and apparently has been well controlled at home on this  #4 history of CVA appears to be doing well in this regard still has some mild left lower extremity weakness he is on aspirin which was increased in the hospital-LDL was 72 in the hospital.  #5 history of chronic kidney disease creatinine is stable at 1.29 BUN of 29 on lab done on October 18 will monitor periodically.  [id number]

## 2017-05-06 ENCOUNTER — Encounter: Payer: Self-pay | Admitting: Internal Medicine

## 2017-05-06 ENCOUNTER — Non-Acute Institutional Stay (SKILLED_NURSING_FACILITY): Payer: PPO | Admitting: Internal Medicine

## 2017-05-06 DIAGNOSIS — M5489 Other dorsalgia: Secondary | ICD-10-CM | POA: Diagnosis not present

## 2017-05-06 DIAGNOSIS — I10 Essential (primary) hypertension: Secondary | ICD-10-CM

## 2017-05-06 DIAGNOSIS — IMO0002 Reserved for concepts with insufficient information to code with codable children: Secondary | ICD-10-CM

## 2017-05-06 DIAGNOSIS — Z794 Long term (current) use of insulin: Secondary | ICD-10-CM

## 2017-05-06 DIAGNOSIS — N182 Chronic kidney disease, stage 2 (mild): Secondary | ICD-10-CM

## 2017-05-06 DIAGNOSIS — E118 Type 2 diabetes mellitus with unspecified complications: Secondary | ICD-10-CM | POA: Diagnosis not present

## 2017-05-06 DIAGNOSIS — I639 Cerebral infarction, unspecified: Secondary | ICD-10-CM

## 2017-05-06 DIAGNOSIS — E1165 Type 2 diabetes mellitus with hyperglycemia: Secondary | ICD-10-CM

## 2017-05-06 NOTE — Progress Notes (Signed)
Location:   [facility] Room Number: 143/P Place of Service:  SNF (31)  Provider: Granville Lewis  PCP: Sharilyn Sites, MD Patient Care Team: Sharilyn Sites, MD as PCP - General (Family Medicine) Gala Romney Cristopher Estimable, MD (Gastroenterology)  Extended Emergency Contact Information Primary Emergency Contact: Wyona Almas Address: [address]          [place], [place] [address] Johnnette Litter of [place] Phone: [phone] Relation: Spouse Secondary Emergency Contact: Ginette Pitman [place] of [place] Phone: [phone] Mobile Phone: [phone] Relation: Daughter  Code Status: Full Code Goals of care:  Advanced Directive information Advanced Directives 05/06/2017  Does Patient Have a Medical Advance Directive? Yes  Type of Advance Directive (No Data)  Does patient want to make changes to medical advance directive? No - Patient declined  Copy of [place] in Chart? No - copy requested  Pre-existing out of facility DNR order (yellow form or pink MOST form) -     Allergies  Allergen Reactions  . Contrast Media [Iodinated Diagnostic Agents] Rash    Chief Complaint  Patient presents with  . Discharge Note    Discharge Visit    HPI:  76 y.o. male  seen today for discharge from facility later this week--. He has been here for short-term rehabilitation after sustaining an acute right lacunar infarct with left-sided weakness.  His other medical issues include history liver transplant-diabetes type 2-hypertension-chronic kidney disease stage II as well as hypothyroidism.  Patient presented initially to the ED with acute onset of left-sided weakness affecting face arm leg and dysarthria-MRI did confirm the CVA as noted above.  Neurology saw him and increased his dose of aspirin up to 325 mg a day-echo and carotid Dopplers were unremarkable.  He has done very well in this regard to the facility has gained strength still has  some very mild weakness when ambulating he would benefit from continued PT and OT-but he has done very well.  He did complain of back pain at one point Robaxin was started when necessary and this appears to have helped he also receives Percocet as needed for chronic pain as well.   He is a type II diabetic he is on Lantus 32 units at at bedtime as well as an aggressive sliding scale-he did have a couple low readings were relatively asymptomatic during his stay but apparently had received his sliding scale insulin well before eating more than an hour apparently-orders have been made to make sure he gets a sliding scale closely meals and I do not really see any significant hypoglycemic readings-he has been on this scale for a long time at home-as prescribed by his endocrinologist and says he has done well with this and usually just receives insulin when he starts to eat and I encouraged him to follow this at home as well.  He does have a history of hypothyroidism TSH was normal at 2.535 on lab done earlier this month.  Regards to hypertension he is on lisinopril he said variable systolics ranging from the 120s to 150s recently do not see consistent elevations I got 140/90 today manually-at this point will defer to primary care provider.  Currently he is sitting in his room comfortably he stands quickly without assistance and actually ambulates without an assistance device still has some weakness but has made good progress to speech is clear continues to be affable in good spirits looking forward to going home.  Again he will need routine PT and  OT as well as nursing support for follow his numerous medical issues he lives with his wife who is very supportive-      Past Medical History:  Diagnosis Date  . Anemia   . Biliary calculi, common bile duct   . CRI (chronic renal insufficiency)   . DM (diabetes mellitus) ([place])    Type II  . Elevated liver enzymes   . Esophageal varices ([place]) 03/19/11     mrcp  . History of ERCP 03/21/2011   Extrahepatic biliary obstruction secondary to occluded biliary stent and colon no cholelithiasis, status post sphincterotomy, stent removal and replacement and stone extraction.  Marland Kitchen HTN (hypertension)   . Hypothyroidism   . NASH (nonalcoholic steatohepatitis) 2006   liver transplant  . Pancreatitis chronic 03/19/11   mrcp  . S/P colonoscopy with polypectomy 2004   Dr Gala Romney  . S/P endoscopy 03/2004   Hx esophgaeal varices, pyloric channel ulcer prior to transplant  . Squamous cell cancer of external ear    pinna    Past Surgical History:  Procedure Laterality Date  . BILE DUCT STENT PLACEMENT  03/21/11   Dr. Gala Romney  . CHOLECYSTECTOMY  2006  . COLONOSCOPY  08/12/2002   Dr. Gala Romney- somewhat diffusely friable rectum and colon, polyp at 30 cm.  . COLONOSCOPY N/A 09/08/2012   Procedure: COLONOSCOPY;  Surgeon: Daneil Dolin, MD;  Location[facility];  Service: Endoscopy;  Laterality: N/A;  10:30  . ERCP  03/21/2011   Dr. Gala Romney- ERCP with removal of pediatric feeding tube, endoscopic sphinecterotomy with balloon dilation of ampulla followed by stone extraction and stent placement  . ESOPHAGOGASTRODUODENOSCOPY  03/20/2004   Dr. Nash Mantis 2 esophageal varices o/w normal esophageal mucosa, mild changes of snake skin in the gastric mucosa consistent with portal gastropathy, multiple antral erosions  and  a single pyloric channel ulcer benign appearance o/w normal gastric mucosa, normal D1 and S2  . KNEE ARTHROSCOPY WITH EXCISION PLICA Left 07/27/4172   Procedure: KNEE ARTHROSCOPY WITH EXCISION PLICA;  Surgeon: Carole Civil, MD;  Location: [facility] ORS;  Service: Orthopedics;  Laterality: Left;  . KNEE ARTHROSCOPY WITH MEDIAL MENISECTOMY Left 11/21/2012   Procedure: KNEE ARTHROSCOPY WITH MEDIAL AND LATERAL MENISECTOMY;  Surgeon: Carole Civil, MD;  Location: [facility];  Service: Orthopedics;  Laterality: Left;  . LIVER TRANSPLANT  2006   UVA-NASH cirrhosis  .  MOUTH SURGERY  may 2012  . MRCP  03/19/11   biliary ductal dilatation- stones. esophageal varices  . SPHINCTEROTOMY  03/21/2011   Procedure: SPHINCTEROTOMY;  Surgeon: Daneil Dolin, MD;  Location: [facility];  Service: Gastroenterology;;      reports that he has never smoked. He has never used smokeless tobacco. He reports that he does not drink alcohol or use drugs. Social History   Social History  . Marital status: Married    Spouse name: N/A  . Number of children: 1  . Years of education: 59   Occupational History  . RETIRED   .  Retired   Social History Main Topics  . Smoking status: Never Smoker  . Smokeless tobacco: Never Used  . Alcohol use No  . Drug use: No  . Sexual activity: Not on file   Other Topics Concern  . Not on file   Social History Narrative  . No narrative on file   Functional Status Survey:    Allergies  Allergen Reactions  . Contrast Media [Iodinated Diagnostic Agents] Rash    Pertinent  Health Maintenance Due  Topic Date Due  . FOOT EXAM  05/18/2017 (Originally 04/05/1951)  . OPHTHALMOLOGY EXAM  05/18/2017 (Originally 04/05/1951)  . URINE MICROALBUMIN  05/18/2017 (Originally 04/05/1951)  . HEMOGLOBIN A1C  10/12/2017  . INFLUENZA VACCINE  Completed  . PNA vac Low Risk Adult  Completed    Medications: Outpatient Encounter Prescriptions as of 05/06/2017  Medication Sig  . aspirin 325 MG tablet Take 1 tablet (325 mg total) by mouth daily.  Marland Kitchen docusate sodium (COLACE) 100 MG capsule Take 100 mg by mouth at bedtime.  . insulin aspart (NOVOLOG FLEXPEN) 100 UNIT/ML FlexPen Give per sliding scale three times a day  . Insulin Glargine (LANTUS SOLOSTAR) 100 UNIT/ML Solostar Pen Inject 32 Units into the skin daily at 10 pm.  . levothyroxine (SYNTHROID, LEVOTHROID) 137 MCG tablet Take 1 tablet (137 mcg total) by mouth daily before breakfast.  . lisinopril (PRINIVIL,ZESTRIL) 20 MG tablet Take 20 mg by mouth daily.   . methocarbamol (ROBAXIN) 500 MG tablet  Take 500 mg by mouth every 6 (six) hours as needed for muscle spasms.  . Multiple Vitamins-Minerals (MULTIVITAMIN WITH MINERALS) tablet Take 1 tablet by mouth daily.    Marland Kitchen oxyCODONE-acetaminophen (PERCOCET/ROXICET) 5-325 MG tablet Take 1 tablet by mouth every 4 (four) hours as needed.  Marland Kitchen Specialty Vitamins Products (MAGNESIUM, AMINO ACID CHELATE,) 133 MG tablet Take 1 tablet by mouth 2 (two) times daily. Magnesium 133mg    . tacrolimus (PROGRAF) 1 MG capsule Take 1 mg by mouth 2 (two) times daily.    No facility-administered encounter medications on file as of 05/06/2017.      Review of Systems   In general is not complaining of any fever or chills weight has been stable.  Skin does not complain of rashes or itching.  Head ears eyes nose mouth and throat does not complain of any visual changes or sore throat.  Respiratory is not complaining of any shortness breath or cough.  Cardiac does not complain of any chest pain has very scant lower extremity edema.  GI is not complaining of any abdominal discomfort nausea vomiting diarrhea constipation.  GU does not complain of dysuria.  Musculoskeletal has complain of back pain apparently this has improved with the when necessary Robaxin continues to receive Percocet as well as needed he takes this once or twice a day.  Neurologic does not complain of dizziness headache has gained most of his strength back on the left side still has some very minimal left leg weakness does not complain of any headache or dizziness or syncope.  Psych continues to be in very good spirits does not complain of any overt anxiety or depression     Temperature is 97.9 pulse 76 respirations 18 blood pressure taken manually 140/90 again has very both systolics as noted above.  Weight is stable at 259 pounds Physical Exam   #1 history of CVA with left-sided hemiparalysis is made very good progress here aspirin has been increased up to 325 mg a day he has minimal  left leg weakness-he would probably benefit from continued PT and OT-.  #2-history of back pain this appears to be improved he does receive the Percocet as needed and also has Robaxin when necessary which appears to have helped since it was added is not complaining of back pain currently at one point he complained of his mattress and adjustments have been made in this regards as well.  #3-history of diabetes type 2 he is on Lantus 32 units a  day as well as aggressive sliding scale insulin-he apparently has been on this at home and tolerated it well-will need followed by endocrinology as needed blood sugars appear to be pretty stable ranging from the 90s to mid 100s through all day parts-I do not see any readings in the 69s and 60s recently  #4 history of renal insufficiency creatinine of 1.29 BUN of 29 appears to be relatively baseline with previous labs--will defer follow-up to primary care provider.  #5-history of hypothyroidism TSH is no above it was within normal range on lab done earlier this month he is on Synthroid.  #6 history of hypertension again does have variable systolic readings as noted above since he is about to go home will not be aggressive increasing his lisinopril since he does not have consistent elevations will defer to primary care provider he says his blood pressures have been pretty stable at home previously.  #7 history of liver transplant he continues on Prograf twice a day has not really complained of any abdominal issues.  Again he will be going home with a supportive wife he will need PT and OT he is currently ambulating about 100 feet with his rolling walker and I did see him walk unassisted although he still has some weakness here he will need continued supervision with ADLs and would benefit from continued nursing support for his multiple medical issues.  [id number] note rate greater than  30 minutes spent on this discharge summary-greater than 50% of time spent  coordinating plan of care for numerous diagnoses  Labs reviewed:as noted below  Basic Metabolic Panel:  Recent Labs  04/14/17 0434 04/18/17 0700 04/25/17 0700  NA 140 138 138  K 3.9 4.5 4.4  CL 110 104 105  CO2 24 27 27   GLUCOSE 115* 137* 176*  BUN 22* 24* 29*  CREATININE 1.04 1.23 1.29*  CALCIUM 8.5* 9.2 9.1  MG  --  1.7  --    Liver Function Tests:  Recent Labs  11/12/16 0823 04/12/17 0450 04/25/17 0700  AST 15 19 21   ALT 15 18 25   ALKPHOS 76 66 72  BILITOT 0.6 1.0 1.0  PROT 7.4 7.3 7.2  ALBUMIN 4.0 3.6 3.5   No results for input(s): LIPASE, AMYLASE in the last 8760 hours. No results for input(s): AMMONIA in the last 8760 hours. CBC:  Recent Labs  04/12/17 0450 04/12/17 0507 04/25/17 0700  WBC 8.7  --  8.3  NEUTROABS 5.8  --   --   HGB 14.1 14.6 14.6  HCT 41.9 43.0 44.5  MCV 97.2  --  98.0  PLT 199  --  206   Cardiac Enzymes:  Recent Labs  04/12/17 0450  TROPONINI <0.03   BNP: Invalid input(s): POCBNP CBG:  Recent Labs  04/16/17 0729 04/16/17 1131 04/16/17 1607  GLUCAP 116* 185* 110*    Procedures and Imaging Studies During Stay: Mr Brain Wo Contrast  Result Date: 04/12/2017 CLINICAL DATA:  Ataxia, stroke suspected. EXAM: MRI HEAD WITHOUT CONTRAST MRA HEAD WITHOUT CONTRAST TECHNIQUE: Multiplanar, multiecho pulse sequences of the brain and surrounding structures were obtained without intravenous contrast. Angiographic images of the head were obtained using MRA technique without contrast. COMPARISON:  Head CT from earlier today FINDINGS: MRI HEAD FINDINGS Brain: 1 cm focus of restricted diffusion in the posterior right corona radiata. Overall mild chronic small vessel ischemic change in the cerebral white matter. There has been a remote small vessel infarct in the left corona radiata. Age normal brain volume.  No mass, hemorrhage, or hydrocephalus. Vascular: Arterial findings below. Normal dural venous sinus flow voids. Skull and upper cervical  spine: Negative for marrow lesion. Sinuses/Orbits: Negative MRA HEAD FINDINGS Motion degraded. No branch occlusion or flow limiting stenosis seen in the anterior circulation. There is mild narrowing of the right ICA at the anterior genu. Codominant vertebral arteries. Mild left V4 segment narrowing. There is a high-grade proximal right P2 segment stenosis with less intense downstream flow. No flow seen beyond the mid left P2 segment. Negative for aneurysm. IMPRESSION: Brain MRI: 1. 1 cm acute infarct in the right corona radiata. 2. Mild chronic small vessel ischemia in the cerebral white matter. Intracranial MRA: 1. Motion degraded. 2. Intracranial atherosclerosis with high-grade proximal right and mid left P2 segment stenoses. 3. Mild left V4 segment narrowing. Electronically Signed   By: Monte Fantasia M.D.   On: 04/12/2017 07:22   US Carotid Bilateral (at [facility] Only)  Result Date: 04/12/2017 CLINICAL DATA:  76 year old male with a history of CVA. Cardiovascular risk factors include hypertension, known prior stroke/TIA, hyperlipidemia, diabetes EXAM: BILATERAL CAROTID DUPLEX ULTRASOUND TECHNIQUE: Pearline Cables scale imaging, color Doppler and duplex ultrasound were performed of bilateral carotid and vertebral arteries in the neck. COMPARISON:  No prior duplex FINDINGS: Criteria: Quantification of carotid stenosis is based on velocity parameters that correlate the residual internal carotid diameter with NASCET-based stenosis levels, using the diameter of the distal internal carotid lumen as the denominator for stenosis measurement. The following velocity measurements were obtained: RIGHT ICA:  Systolic 61 cm/sec, Diastolic 25 cm/sec CCA:  87 cm/sec SYSTOLIC ICA/CCA RATIO:  0.7 ECA:  80 set cm/sec LEFT ICA:  Systolic 55 cm/sec, Diastolic 11 cm/sec CCA:  94 cm/sec SYSTOLIC ICA/CCA RATIO:  0.6 ECA:  133 cm/sec Right Brachial SBP: Not acquired Left Brachial SBP: Not acquired RIGHT CAROTID ARTERY: No significant  calcifications of the right common carotid artery. Intermediate waveform maintained. Heterogeneous and partially calcified plaque at the right carotid bifurcation. No significant lumen shadowing. Low resistance waveform of the right ICA. Tortuosity RIGHT VERTEBRAL ARTERY: Antegrade flow with low resistance waveform. LEFT CAROTID ARTERY: No significant calcifications of the left common carotid artery. Intermediate waveform maintained. Heterogeneous and partially calcified plaque at the left carotid bifurcation without significant lumen shadowing. Low resistance waveform of the left ICA. tortuosity. LEFT VERTEBRAL ARTERY:  Antegrade flow with low resistance waveform. IMPRESSION: Color duplex indicates minimal heterogeneous and calcified plaque, with no hemodynamically significant stenosis by duplex criteria in the extracranial cerebrovascular circulation. Signed, Dulcy Fanny. Earleen Newport, DO [facility] Electronically Signed   By: Corrie Mckusick D.O.   On: 04/12/2017 10:57   Mr Jodene Nam Head/brain [facility] Cm  Result Date: 04/12/2017 CLINICAL DATA:  Ataxia, stroke suspected. EXAM: MRI HEAD WITHOUT CONTRAST MRA HEAD WITHOUT CONTRAST TECHNIQUE: Multiplanar, multiecho pulse sequences of the brain and surrounding structures were obtained without intravenous contrast. Angiographic images of the head were obtained using MRA technique without contrast. COMPARISON:  Head CT from earlier today FINDINGS: MRI HEAD FINDINGS Brain: 1 cm focus of restricted diffusion in the posterior right corona radiata. Overall mild chronic small vessel ischemic change in the cerebral white matter. There has been a remote small vessel infarct in the left corona radiata. Age normal brain volume. No mass, hemorrhage, or hydrocephalus. Vascular: Arterial findings below. Normal dural venous sinus flow voids. Skull and upper cervical spine: Negative for marrow lesion. Sinuses/Orbits: Negative MRA HEAD FINDINGS  Motion degraded. No branch occlusion or flow  limiting stenosis seen in the anterior circulation. There is mild narrowing of the right ICA at the anterior genu. Codominant vertebral arteries. Mild left V4 segment narrowing. There is a high-grade proximal right P2 segment stenosis with less intense downstream flow. No flow seen beyond the mid left P2 segment. Negative for aneurysm. IMPRESSION: Brain MRI: 1. 1 cm acute infarct in the right corona radiata. 2. Mild chronic small vessel ischemia in the cerebral white matter. Intracranial MRA: 1. Motion degraded. 2. Intracranial atherosclerosis with high-grade proximal right and mid left P2 segment stenoses. 3. Mild left V4 segment narrowing. Electronically Signed   By: Monte Fantasia M.D.   On: 04/12/2017 07:22   Ct Head Code Stroke Wo Contrast  Result Date: 04/12/2017 CLINICAL DATA:  Code stroke. Acute onset LEFT-sided weakness. History of hypertension, diabetes. EXAM: CT HEAD WITHOUT CONTRAST TECHNIQUE: Contiguous axial images were obtained from the base of the skull through the vertex without intravenous contrast. COMPARISON:  CT HEAD November 03, 2004 FINDINGS: BRAIN: No intraparenchymal hemorrhage, mass effect nor midline shift. The ventricles and sulci are normal for age. Patchy supratentorial white matter hypodensities within normal range for patient's age, though non-specific are most compatible with chronic small vessel ischemic disease. Old LEFT basal ganglia and LEFT thalamus lacunar infarcts. No acute large vascular territory infarcts. No abnormal extra-axial fluid collections. Basal cisterns are patent. VASCULAR: Moderate to severe calcific atherosclerosis of the carotid siphons. Tiny densities along bilateral middle cerebral artery's compatible with atherosclerosis. SKULL: No skull fracture. No significant scalp soft tissue swelling. SINUSES/ORBITS: The mastoid air-cells and included paranasal sinuses are well-aerated.The included ocular globes and orbital  contents are non-suspicious. OTHER: None. ASPECTS [facility] Stroke Program Early CT Score) - Ganglionic level infarction (caudate, lentiform nuclei, internal capsule, insula, M1-M3 cortex): 7 - Supraganglionic infarction (M4-M6 cortex): 3 Total score (0-10 with 10 being normal): 10 IMPRESSION: 1. Negative noncontrast CT HEAD for age. 2. ASPECTS is 10. 3. Critical Value/emergent results were called by telephone at the time of interpretation on 04/12/2017 at 5:10 am to Dr. Rolland Porter , who verbally acknowledged these results. Electronically Signed   By: Elon Alas M.D.   On: 04/12/2017 05:11    d:

## 2017-05-08 DIAGNOSIS — K861 Other chronic pancreatitis: Secondary | ICD-10-CM | POA: Diagnosis not present

## 2017-05-08 DIAGNOSIS — Z794 Long term (current) use of insulin: Secondary | ICD-10-CM | POA: Diagnosis not present

## 2017-05-08 DIAGNOSIS — E1122 Type 2 diabetes mellitus with diabetic chronic kidney disease: Secondary | ICD-10-CM

## 2017-05-08 DIAGNOSIS — K219 Gastro-esophageal reflux disease without esophagitis: Secondary | ICD-10-CM | POA: Diagnosis not present

## 2017-05-08 DIAGNOSIS — E039 Hypothyroidism, unspecified: Secondary | ICD-10-CM | POA: Diagnosis not present

## 2017-05-08 DIAGNOSIS — I69354 Hemiplegia and hemiparesis following cerebral infarction affecting left non-dominant side: Secondary | ICD-10-CM

## 2017-05-08 DIAGNOSIS — Z7982 Long term (current) use of aspirin: Secondary | ICD-10-CM | POA: Diagnosis not present

## 2017-05-08 DIAGNOSIS — I129 Hypertensive chronic kidney disease with stage 1 through stage 4 chronic kidney disease, or unspecified chronic kidney disease: Secondary | ICD-10-CM

## 2017-05-08 DIAGNOSIS — N182 Chronic kidney disease, stage 2 (mild): Secondary | ICD-10-CM

## 2017-05-10 ENCOUNTER — Other Ambulatory Visit: Payer: Self-pay

## 2017-05-10 NOTE — Patient Outreach (Signed)
[facility]) Care Management  05/10/2017  [name] 19-Apr-1941 [id number]      EMMI- STROKE RED ON EMMI ALERT Day # 1 Date: 05/09/17 Red Alert Reason: "Problems setting up rehab? Yes"   Outreach attempt #  1 to patient. Patient states he is doing fine since return home. Reviewed and addressed red alert with patient. Patient is not getting rehab but voices he is to start [facility] services. He reports that agency contacted him today and wanted to make a visit. However, patient reports he is going with his wife to her MD appt and requested that they come visit on Monday. He has MD f/u appt scheduled for 05/14/17. He voices supportive family assistance and they will be taking him to appts. He states he has all his meds and only new med was OTC ASA. He voices no issues or questions regarding his meds. No further RN CM needs or concerns at this time. Advised patient that they would continue to get automated EMMI-Stroke post discharge calls to assess how they are doing following recent hospitalization and will receive a call from a nurse if any of their responses were abnormal. Patient voiced understanding and was appreciative of f/u call.      Plan: RN CM will notify [facility] administrative assistant of case status.    Enzo Montgomery, RN,BSN,CCM [facility] Telephonic Care Management Coordinator Direct Phone: [phone] Toll Free: [phone] Fax: [phone]

## 2017-05-14 DIAGNOSIS — Z6839 Body mass index (BMI) 39.0-39.9, adult: Secondary | ICD-10-CM | POA: Diagnosis not present

## 2017-05-14 DIAGNOSIS — E118 Type 2 diabetes mellitus with unspecified complications: Secondary | ICD-10-CM | POA: Diagnosis not present

## 2017-05-14 DIAGNOSIS — Z1389 Encounter for screening for other disorder: Secondary | ICD-10-CM | POA: Diagnosis not present

## 2017-05-14 DIAGNOSIS — I639 Cerebral infarction, unspecified: Secondary | ICD-10-CM | POA: Diagnosis not present

## 2017-05-14 DIAGNOSIS — E039 Hypothyroidism, unspecified: Secondary | ICD-10-CM | POA: Diagnosis not present

## 2017-05-14 DIAGNOSIS — Z944 Liver transplant status: Secondary | ICD-10-CM | POA: Diagnosis not present

## 2017-05-14 DIAGNOSIS — E1165 Type 2 diabetes mellitus with hyperglycemia: Secondary | ICD-10-CM | POA: Diagnosis not present

## 2017-05-14 DIAGNOSIS — I10 Essential (primary) hypertension: Secondary | ICD-10-CM | POA: Diagnosis not present

## 2017-05-15 LAB — HEMOGLOBIN A1C
Est. average glucose Bld gHb Est-mCnc: 154 mg/dL
Hgb A1c MFr Bld: 7 % — ABNORMAL HIGH (ref 4.8–5.6)

## 2017-05-15 LAB — COMPREHENSIVE METABOLIC PANEL
ALBUMIN: 3.9 g/dL (ref 3.5–4.8)
ALT: 16 IU/L (ref 0–44)
AST: 15 IU/L (ref 0–40)
Albumin/Globulin Ratio: 1.3 (ref 1.2–2.2)
Alkaline Phosphatase: 77 IU/L (ref 39–117)
BILIRUBIN TOTAL: 0.6 mg/dL (ref 0.0–1.2)
BUN / CREAT RATIO: 19 (ref 10–24)
BUN: 24 mg/dL (ref 8–27)
CALCIUM: 9.4 mg/dL (ref 8.6–10.2)
CHLORIDE: 105 mmol/L (ref 96–106)
CO2: 22 mmol/L (ref 20–29)
CREATININE: 1.24 mg/dL (ref 0.76–1.27)
GFR, EST AFRICAN AMERICAN: 65 mL/min/{1.73_m2} (ref >59)
GFR, EST NON AFRICAN AMERICAN: 56 mL/min/{1.73_m2} — AB (ref >59)
Globulin, Total: 3.1 g/dL (ref 1.5–4.5)
Glucose: 164 mg/dL — ABNORMAL HIGH (ref 65–99)
Potassium: 4.8 mmol/L (ref 3.5–5.2)
Sodium: 142 mmol/L (ref 134–144)
TOTAL PROTEIN: 7 g/dL (ref 6.0–8.5)

## 2017-05-15 LAB — TSH: TSH: 3.23 u[IU]/mL (ref 0.450–4.500)

## 2017-05-15 LAB — T4, FREE: FREE T4: 1.14 ng/dL (ref 0.82–1.77)

## 2017-05-16 ENCOUNTER — Other Ambulatory Visit: Payer: Self-pay | Admitting: *Deleted

## 2017-05-16 NOTE — Patient Outreach (Signed)
[facility]) Care Management  05/16/2017  [name] 02-Jul-1941 [id number]   CSW had received referral 05/08/17 from [place] that patient is a 76 year old admitted with CVA, member lives with his wife and was independent prior to CVA. CSW attempted to meet with patient at [facility] SNF today, but was informed that he had discharged home from SNF last Tuesday, 05/07/17. CSW called & spoke with patient to offer services, but patient declined stating that he has no need for anyone else. Patient states that he is receiving physical therapy through home health and had no need for the home health nurse. Patient states that his daughter and wife are involved and supportive of his care. Patient's wife transports him to his doctors appointments. No CSW or RNCM needs identified at this time.   CSW will inform CMA of case closure as patient has declined services.     Raynaldo Opitz, LCSW [facility]  Clinical Social Worker cell #: [phone]

## 2017-05-27 DIAGNOSIS — E1122 Type 2 diabetes mellitus with diabetic chronic kidney disease: Secondary | ICD-10-CM | POA: Diagnosis not present

## 2017-05-27 DIAGNOSIS — E039 Hypothyroidism, unspecified: Secondary | ICD-10-CM | POA: Diagnosis not present

## 2017-05-27 DIAGNOSIS — N182 Chronic kidney disease, stage 2 (mild): Secondary | ICD-10-CM | POA: Diagnosis not present

## 2017-05-27 DIAGNOSIS — Z794 Long term (current) use of insulin: Secondary | ICD-10-CM | POA: Diagnosis not present

## 2017-05-27 DIAGNOSIS — I69354 Hemiplegia and hemiparesis following cerebral infarction affecting left non-dominant side: Secondary | ICD-10-CM | POA: Diagnosis not present

## 2017-05-27 DIAGNOSIS — K861 Other chronic pancreatitis: Secondary | ICD-10-CM | POA: Diagnosis not present

## 2017-05-27 DIAGNOSIS — K219 Gastro-esophageal reflux disease without esophagitis: Secondary | ICD-10-CM | POA: Diagnosis not present

## 2017-05-27 DIAGNOSIS — Z7982 Long term (current) use of aspirin: Secondary | ICD-10-CM | POA: Diagnosis not present

## 2017-05-27 DIAGNOSIS — I129 Hypertensive chronic kidney disease with stage 1 through stage 4 chronic kidney disease, or unspecified chronic kidney disease: Secondary | ICD-10-CM | POA: Diagnosis not present

## 2017-06-03 ENCOUNTER — Encounter: Payer: Self-pay | Admitting: "Endocrinology

## 2017-06-03 ENCOUNTER — Ambulatory Visit (INDEPENDENT_AMBULATORY_CARE_PROVIDER_SITE_OTHER): Payer: PPO | Admitting: "Endocrinology

## 2017-06-03 VITALS — BP 151/90 | HR 88 | Ht 69.5 in | Wt 259.0 lb

## 2017-06-03 DIAGNOSIS — I10 Essential (primary) hypertension: Secondary | ICD-10-CM

## 2017-06-03 DIAGNOSIS — E118 Type 2 diabetes mellitus with unspecified complications: Secondary | ICD-10-CM | POA: Diagnosis not present

## 2017-06-03 DIAGNOSIS — E039 Hypothyroidism, unspecified: Secondary | ICD-10-CM | POA: Diagnosis not present

## 2017-06-03 DIAGNOSIS — E1165 Type 2 diabetes mellitus with hyperglycemia: Secondary | ICD-10-CM | POA: Diagnosis not present

## 2017-06-03 DIAGNOSIS — E782 Mixed hyperlipidemia: Secondary | ICD-10-CM

## 2017-06-03 DIAGNOSIS — Z794 Long term (current) use of insulin: Secondary | ICD-10-CM

## 2017-06-03 DIAGNOSIS — IMO0002 Reserved for concepts with insufficient information to code with codable children: Secondary | ICD-10-CM

## 2017-06-03 MED ORDER — FREESTYLE LIBRE READER DEVI: 1.0000 | Freq: Once | 0 refills | Status: AC

## 2017-06-03 MED ORDER — FREESTYLE LIBRE SENSOR SYSTEM MISC: 2 refills | Status: DC

## 2017-06-03 NOTE — Patient Instructions (Signed)

## 2017-06-03 NOTE — Progress Notes (Signed)
Subjective:    Patient ID: [name], male    DOB: 1940-12-08, PCP Sharilyn Sites, MD   Past Medical History:  Diagnosis Date  . Anemia   . Biliary calculi, common bile duct   . CRI (chronic renal insufficiency)   . DM (diabetes mellitus) ([place])    Type II  . Elevated liver enzymes   . Esophageal varices ([place]) 03/19/11   mrcp  . History of ERCP 03/21/2011   Extrahepatic biliary obstruction secondary to occluded biliary stent and colon no cholelithiasis, status post sphincterotomy, stent removal and replacement and stone extraction.  Marland Kitchen HTN (hypertension)   . Hypothyroidism   . NASH (nonalcoholic steatohepatitis) 2006   liver transplant  . Pancreatitis chronic 03/19/11   mrcp  . S/P colonoscopy with polypectomy 2004   Dr Gala Romney  . S/P endoscopy 03/2004   Hx esophgaeal varices, pyloric channel ulcer prior to transplant  . Squamous cell cancer of external ear    pinna   Past Surgical History:  Procedure Laterality Date  . BILE DUCT STENT PLACEMENT  03/21/11   Dr. Gala Romney  . CHOLECYSTECTOMY  2006  . COLONOSCOPY  08/12/2002   Dr. Gala Romney- somewhat diffusely friable rectum and colon, polyp at 30 cm.  . COLONOSCOPY N/A 09/08/2012   Procedure: COLONOSCOPY;  Surgeon: Daneil Dolin, MD;  Location: [facility];  Service: Endoscopy;  Laterality: N/A;  10:30  . ERCP  03/21/2011   Dr. Gala Romney- ERCP with removal of pediatric feeding tube, endoscopic sphinecterotomy with balloon dilation of ampulla followed by stone extraction and stent placement  . ESOPHAGOGASTRODUODENOSCOPY  03/20/2004   Dr. Nash Mantis 2 esophageal varices o/w normal esophageal mucosa, mild changes of snake skin in the gastric mucosa consistent with portal gastropathy, multiple antral erosions  and  a single pyloric channel ulcer benign appearance o/w normal gastric mucosa, normal D1 and S2  . KNEE ARTHROSCOPY WITH EXCISION PLICA Left 11/16/2583   Procedure: KNEE ARTHROSCOPY WITH EXCISION PLICA;  Surgeon: Carole Civil,  MD;  Location: [facility];  Service: Orthopedics;  Laterality: Left;  . KNEE ARTHROSCOPY WITH MEDIAL MENISECTOMY Left 11/21/2012   Procedure: KNEE ARTHROSCOPY WITH MEDIAL AND LATERAL MENISECTOMY;  Surgeon: Carole Civil, MD;  Location: [facility];  Service: Orthopedics;  Laterality: Left;  . LIVER TRANSPLANT  2006   UVA-NASH cirrhosis  . MOUTH SURGERY  may 2012  . MRCP  03/19/11   biliary ductal dilatation- stones. esophageal varices  . SPHINCTEROTOMY  03/21/2011   Procedure: SPHINCTEROTOMY;  Surgeon: Daneil Dolin, MD;  Location: [facility];  Service: Gastroenterology;;   Social History   Socioeconomic History  . Marital status: Married    Spouse name: None  . Number of children: 1  . Years of education: 30  . Highest education level: None  Social Needs  . Financial resource strain: None  . Food insecurity - worry: None  . Food insecurity - inability: None  . Transportation needs - medical: None  . Transportation needs - non-medical: None  Occupational History  . Occupation: RETIRED    Employer: RETIRED  Tobacco Use  . Smoking status: Never Smoker  . Smokeless tobacco: Never Used  Substance and Sexual Activity  . Alcohol use: No  . Drug use: No  . Sexual activity: None  Other Topics Concern  . None  Social History Narrative  . None   Outpatient Encounter Medications as of 06/03/2017  Medication Sig  . aspirin 325 MG tablet Take 1 tablet (325  mg total) by mouth daily.  . Continuous Blood Gluc Receiver (FREESTYLE LIBRE READER) DEVI 1 Piece by Does not apply route once for 1 dose.  . Continuous Blood Gluc Sensor (FREESTYLE LIBRE SENSOR SYSTEM) MISC Use one sensor every 10 days.  Marland Kitchen docusate sodium (COLACE) 100 MG capsule Take 100 mg by mouth at bedtime.  . insulin aspart (NOVOLOG FLEXPEN) 100 UNIT/ML FlexPen Give per sliding scale three times a day  . Insulin Glargine (LANTUS SOLOSTAR) 100 UNIT/ML Solostar Pen Inject 32 Units into the skin daily at 10 pm.  . levothyroxine  (SYNTHROID, LEVOTHROID) 137 MCG tablet Take 1 tablet (137 mcg total) by mouth daily before breakfast.  . lisinopril (PRINIVIL,ZESTRIL) 20 MG tablet Take 20 mg by mouth daily.   . methocarbamol (ROBAXIN) 500 MG tablet Take 500 mg by mouth every 6 (six) hours as needed for muscle spasms.  . Multiple Vitamins-Minerals (MULTIVITAMIN WITH MINERALS) tablet Take 1 tablet by mouth daily.    Marland Kitchen oxyCODONE-acetaminophen (PERCOCET/ROXICET) 5-325 MG tablet Take 1 tablet by mouth every 4 (four) hours as needed.  Marland Kitchen Specialty Vitamins Products (MAGNESIUM, AMINO ACID CHELATE,) 133 MG tablet Take 1 tablet by mouth 2 (two) times daily. Magnesium 133mg    . tacrolimus (PROGRAF) 1 MG capsule Take 1 mg by mouth 2 (two) times daily.    No facility-administered encounter medications on file as of 06/03/2017.    ALLERGIES: Allergies  Allergen Reactions  . Contrast Media [Iodinated Diagnostic Agents] Rash   VACCINATION STATUS: Immunization History  Administered Date(s) Administered  . Influenza Split 02/13/2011, 04/29/2012, 04/20/2013, 05/09/2013  . Influenza, High Dose Seasonal PF 04/26/2015, 04/13/2017  . Influenza-Unspecified 04/08/2013  . Pneumococcal Conjugate-13 07/29/2014  . Pneumococcal Polysaccharide-23 03/17/2010, 04/16/2011    Diabetes  He presents for his follow-up diabetic visit. He has type 2 diabetes mellitus. Onset time: He was diagnosed at approximate age of 89 years. His disease course has been improving. There are no hypoglycemic associated symptoms. Pertinent negatives for hypoglycemia include no confusion, headaches, pallor or seizures. There are no diabetic associated symptoms. Pertinent negatives for diabetes include no chest pain, no fatigue, no polydipsia, no polyphagia, no polyuria and no weakness. There are no hypoglycemic complications. Symptoms are improving. Diabetic complications include a CVA. Risk factors for coronary artery disease include diabetes mellitus, dyslipidemia,  hypertension, male sex, obesity, sedentary lifestyle and tobacco exposure. Current diabetic treatment includes intensive insulin program. He is compliant with treatment most of the time. His weight is stable. He is following a generally unhealthy diet. He rarely participates in exercise. His home blood glucose trend is decreasing steadily. His breakfast blood glucose range is generally 140-180 mg/dl. His lunch blood glucose range is generally 140-180 mg/dl. His dinner blood glucose range is generally 140-180 mg/dl. His overall blood glucose range is 140-180 mg/dl. An ACE inhibitor/angiotensin II receptor blocker is being taken.  Hyperlipidemia  This is a chronic problem. The current episode started more than 1 year ago. Pertinent negatives include no chest pain, myalgias or shortness of breath. He is currently on no antihyperlipidemic treatment.  Hypertension  This is a chronic problem. The current episode started more than 1 year ago. Pertinent negatives include no chest pain, headaches, neck pain, palpitations or shortness of breath. Past treatments include ACE inhibitors. Hypertensive end-organ damage includes CVA. He is status post liver transplant.. Identifiable causes of hypertension include a thyroid problem.  Thyroid Problem  Presents for follow-up visit. Patient reports no constipation, diarrhea, fatigue or palpitations. The symptoms have been improving. Past  treatments include levothyroxine. The following procedures have not been performed: thyroidectomy. His past medical history is significant for hyperlipidemia.     Review of Systems  Constitutional: Negative for fatigue and unexpected weight change.  HENT: Negative for dental problem, mouth sores and trouble swallowing.   Eyes: Negative for visual disturbance.  Respiratory: Negative for cough, choking, chest tightness, shortness of breath and wheezing.   Cardiovascular: Negative for chest pain, palpitations and leg swelling.   Gastrointestinal: Negative for abdominal distention, abdominal pain, constipation, diarrhea, nausea and vomiting.  Endocrine: Negative for polydipsia, polyphagia and polyuria.  Genitourinary: Negative for dysuria, flank pain, hematuria and urgency.  Musculoskeletal: Negative for back pain, gait problem, myalgias and neck pain.  Skin: Negative for pallor, rash and wound.  Neurological: Negative for seizures, syncope, weakness, numbness and headaches.  Psychiatric/Behavioral: Negative.  Negative for confusion and dysphoric mood.    Objective:    BP (!) 151/90   Pulse 88   Ht 5' 9.5" (1.765 m)   Wt 259 lb (117.5 kg)   BMI 37.70 kg/m   Wt Readings from Last 3 Encounters:  06/03/17 259 lb (117.5 kg)  04/16/17 259 lb 15.1 oz (117.9 kg)  01/28/17 261 lb (118.4 kg)    Physical Exam  Constitutional: He is oriented to person, place, and time. He appears well-developed and well-nourished. He is cooperative. No distress.  HENT:  Head: Normocephalic and atraumatic.  Eyes: EOM are normal.  Neck: Normal range of motion. Neck supple. No tracheal deviation present. No thyromegaly present.  Cardiovascular: Normal rate, S1 normal, S2 normal and normal heart sounds. Exam reveals no gallop.  No murmur heard. Pulses:      Dorsalis pedis pulses are 1+ on the right side, and 1+ on the left side.       Posterior tibial pulses are 1+ on the right side, and 1+ on the left side.  Pulmonary/Chest: Breath sounds normal. No respiratory distress. He has no wheezes.  Abdominal: Soft. Bowel sounds are normal. He exhibits no distension. There is no tenderness. There is no guarding and no CVA tenderness.  Musculoskeletal: He exhibits no edema.       Right shoulder: He exhibits no swelling and no deformity.  Neurological: He is alert and oriented to person, place, and time. He has normal strength and normal reflexes. No cranial nerve deficit or sensory deficit. Gait normal.  Skin: Skin is warm and dry. No rash  noted. No cyanosis. Nails show no clubbing.  Psychiatric: He has a normal mood and affect. His speech is normal and behavior is normal. Judgment and thought content normal. Cognition and memory are normal.    Results for orders placed or performed during the hospital encounter of 04/25/17  CBC  Result Value Ref Range   WBC 8.3 4.0 - 10.5 K/uL   RBC 4.54 4.22 - 5.81 MIL/uL   Hemoglobin 14.6 13.0 - 17.0 g/dL   HCT 44.5 39.0 - 52.0 %   MCV 98.0 78.0 - 100.0 fL   MCH 32.2 26.0 - 34.0 pg   MCHC 32.8 30.0 - 36.0 g/dL   RDW 13.1 11.5 - 15.5 %   Platelets 206 150 - 400 K/uL  Comprehensive metabolic panel  Result Value Ref Range   Sodium 138 135 - 145 mmol/L   Potassium 4.4 3.5 - 5.1 mmol/L   Chloride 105 101 - 111 mmol/L   CO2 27 22 - 32 mmol/L   Glucose, Bld 176 (H) 65 - 99 mg/dL   BUN  29 (H) 6 - 20 mg/dL   Creatinine, Ser 1.29 (H) 0.61 - 1.24 mg/dL   Calcium 9.1 8.9 - 10.3 mg/dL   Total Protein 7.2 6.5 - 8.1 g/dL   Albumin 3.5 3.5 - 5.0 g/dL   AST 21 15 - 41 U/L   ALT 25 17 - 63 U/L   Alkaline Phosphatase 72 38 - 126 U/L   Total Bilirubin 1.0 0.3 - 1.2 mg/dL   GFR calc non Af Amer 52 (L) >60 mL/min   GFR calc Af Amer >60 >60 mL/min   Anion gap 6 5 - 15   Diabetic Labs (most recent): Lab Results  Component Value Date   HGBA1C 7.0 (H) 05/14/2017   HGBA1C 6.8 (H) 04/13/2017   HGBA1C 7.4 (H) 11/12/2016   Results for [name], [name] (MRN [id number]) as of 06/03/2017 09:04  Ref. Range 04/18/2017 07:00 05/14/2017 08:07  TSH Latest Ref Range: 0.450 - 4.500 uIU/mL 2.535 3.230  T4,Free(Direct) Latest Ref Range: 0.82 - 1.77 ng/dL 1.14 (H) 1.14     Assessment & Plan:   1. Uncontrolled type 2 diabetes mellitus with complication, with long-term current use of insulin ([place])  - Since his last visit, he had developed CVA requiring rehabilitation and physical therapy. He remains at a high risk for more acute and chronic complications of diabetes which include CAD, CVA, CKD, retinopathy,  and neuropathy. These are all discussed in detail with the patient.  Patient came with stable A1c of   7% .  - His most recent blood glucose readings are near target.  Glucose logs and insulin administration records pertaining to this visit,  to be scanned into patient's records.  Recent labs reviewed.   - I have re-counseled the patient on diet management   by adopting a carbohydrate restricted / protein rich  Diet.  -  Suggestion is made for him to avoid simple carbohydrates  from his diet including Cakes, Sweet Desserts / Pastries, Ice Cream, Soda (diet and regular), Sweet Tea, Candies, Chips, Cookies, Store Bought Juices, Alcohol in Excess of  1-2 drinks a day, Artificial Sweeteners, and "Sugar-free" Products. This will help patient to have stable blood glucose profile and potentially avoid unintended weight gain.   - Patient is advised to stick to a routine mealtimes to eat 3 meals  a day and avoid unnecessary snacks ( to snack only to correct hypoglycemia).  - I have approached patient with the following individualized plan to manage diabetes and patient agrees.  - I'll continue Lantus 32 units daily at bedtime, NovoLog 12 units 3 times a day before meals plus correction. - He is  advised to continue strict monitoring of blood glucose 4 times a day-before meals and at bedtime and as needed. - He will benefit from continuous glucose monitoring. I have discussed and initiated a prescription for Freestyle CGM device. - I advised him to contact me for hypoglycemia or hyperglycemia above 200. - Adjustments for hypo-and hyperglycemia is given in written instructions to the patient.  -Patient is not a candidate for metformin, SGLT2 inhibitors, and incretin in therapy due to history of liver transplant.  - Patient specific target  for A1c; LDL, HDL, Triglycerides, and  Waist Circumference were discussed in detail.  2) BP/HTN: Controlled. Continue current medications including ACEI/ARB. 3)  Lipids/HPL:  Controlled, not on statins. 4)  Weight/Diet:  exercise, and carbohydrates information provided.  5) Primary hypothyroidism:  His thyroid function tests are consistent with appropriate  replacement. I will continue  levothyroxine  137 g  by mouth every morning.  - We discussed about correct intake of levothyroxine, at fasting, with water, separated by at least 30 minutes from breakfast, and separated by more than 4 hours from calcium, iron, multivitamins, acid reflux medications (PPIs). -Patient is made aware of the fact that thyroid hormone replacement is needed for life, dose to be adjusted by periodic monitoring of thyroid function tests.  5) Chronic Care/Health Maintenance:  -Patient is on ACEI/ARB  medications and encouraged to continue to follow up with Ophthalmology, Podiatrist at least yearly or according to recommendations, and advised to  stay away from smoking. I have recommended yearly flu vaccine and pneumonia vaccination at least every 5 years; and  sleep for at least 7 hours a day.  - I advised patient to maintain close follow up with Sharilyn Sites, MD for primary care needs.  - Time spent with the patient: 25 min, of which >50% was spent in reviewing his sugar logs , discussing his hypo- and hyper-glycemic episodes, reviewing his current and  previous labs and insulin doses and developing a plan to avoid hypo- and hyper-glycemia.    Follow up plan: -Return in about 3 months (around 09/03/2017) for follow up with pre-visit labs, meter, and logs.  Glade Lloyd, MD Phone: [phone]  Fax: [phone]  -  This note was partially dictated with voice recognition software. Similar sounding words can be transcribed inadequately or may not  be corrected upon review.  06/03/2017, 9:14 AM

## 2017-07-25 DIAGNOSIS — Z944 Liver transplant status: Secondary | ICD-10-CM | POA: Diagnosis not present

## 2017-07-27 ENCOUNTER — Other Ambulatory Visit: Payer: Self-pay | Admitting: "Endocrinology

## 2017-07-29 DIAGNOSIS — Z1389 Encounter for screening for other disorder: Secondary | ICD-10-CM | POA: Diagnosis not present

## 2017-07-29 DIAGNOSIS — Z6839 Body mass index (BMI) 39.0-39.9, adult: Secondary | ICD-10-CM | POA: Diagnosis not present

## 2017-07-29 DIAGNOSIS — H6120 Impacted cerumen, unspecified ear: Secondary | ICD-10-CM | POA: Diagnosis not present

## 2017-07-29 DIAGNOSIS — Z0001 Encounter for general adult medical examination with abnormal findings: Secondary | ICD-10-CM | POA: Diagnosis not present

## 2017-08-12 DIAGNOSIS — E1129 Type 2 diabetes mellitus with other diabetic kidney complication: Secondary | ICD-10-CM | POA: Diagnosis not present

## 2017-08-12 DIAGNOSIS — Z1389 Encounter for screening for other disorder: Secondary | ICD-10-CM | POA: Diagnosis not present

## 2017-08-12 DIAGNOSIS — E1165 Type 2 diabetes mellitus with hyperglycemia: Secondary | ICD-10-CM | POA: Diagnosis not present

## 2017-08-12 DIAGNOSIS — E118 Type 2 diabetes mellitus with unspecified complications: Secondary | ICD-10-CM | POA: Diagnosis not present

## 2017-08-12 DIAGNOSIS — Z944 Liver transplant status: Secondary | ICD-10-CM | POA: Diagnosis not present

## 2017-08-12 DIAGNOSIS — Z0000 Encounter for general adult medical examination without abnormal findings: Secondary | ICD-10-CM | POA: Diagnosis not present

## 2017-08-12 DIAGNOSIS — Z0001 Encounter for general adult medical examination with abnormal findings: Secondary | ICD-10-CM | POA: Diagnosis not present

## 2017-08-12 LAB — BASIC METABOLIC PANEL
BUN: 23 — AB (ref 4–21)
Creatinine: 1.4 — AB (ref <=1.3)

## 2017-08-12 LAB — LIPID PANEL
CHOLESTEROL: 131 (ref 0–200)
HDL: 35 (ref 35–70)
LDL Cholesterol: 73
TRIGLYCERIDES: 113 (ref 40–160)

## 2017-08-13 LAB — HEMOGLOBIN A1C
Est. average glucose Bld gHb Est-mCnc: 154 mg/dL
Hgb A1c MFr Bld: 7 % — ABNORMAL HIGH (ref 4.8–5.6)

## 2017-09-05 ENCOUNTER — Other Ambulatory Visit: Payer: Self-pay

## 2017-09-05 ENCOUNTER — Ambulatory Visit (INDEPENDENT_AMBULATORY_CARE_PROVIDER_SITE_OTHER): Payer: PPO | Admitting: "Endocrinology

## 2017-09-05 ENCOUNTER — Encounter: Payer: Self-pay | Admitting: "Endocrinology

## 2017-09-05 VITALS — BP 141/84 | HR 86 | Ht 69.5 in | Wt 260.0 lb

## 2017-09-05 DIAGNOSIS — E1122 Type 2 diabetes mellitus with diabetic chronic kidney disease: Secondary | ICD-10-CM | POA: Diagnosis not present

## 2017-09-05 DIAGNOSIS — E039 Hypothyroidism, unspecified: Secondary | ICD-10-CM

## 2017-09-05 DIAGNOSIS — Z794 Long term (current) use of insulin: Secondary | ICD-10-CM

## 2017-09-05 DIAGNOSIS — I10 Essential (primary) hypertension: Secondary | ICD-10-CM | POA: Diagnosis not present

## 2017-09-05 DIAGNOSIS — Z6837 Body mass index (BMI) 37.0-37.9, adult: Secondary | ICD-10-CM

## 2017-09-05 DIAGNOSIS — E782 Mixed hyperlipidemia: Secondary | ICD-10-CM

## 2017-09-05 DIAGNOSIS — N183 Chronic kidney disease, stage 3 unspecified: Secondary | ICD-10-CM

## 2017-09-05 MED ORDER — INSULIN ASPART 100 UNIT/ML FLEXPEN: 12.0000 [IU] | PEN_INJECTOR | Freq: Three times a day (TID) | SUBCUTANEOUS | 2 refills | Status: DC

## 2017-09-05 MED ORDER — INSULIN GLARGINE 100 UNIT/ML SOLOSTAR PEN: 32.0000 [IU] | PEN_INJECTOR | Freq: Every day | SUBCUTANEOUS | 2 refills | Status: DC

## 2017-09-05 MED ORDER — INSULIN ASPART 100 UNIT/ML FLEXPEN: 12.0000 [IU] | PEN_INJECTOR | Freq: Three times a day (TID) | SUBCUTANEOUS | 1 refills | Status: DC

## 2017-09-05 MED ORDER — LEVOTHYROXINE SODIUM 137 MCG PO TABS: 137.0000 ug | ORAL_TABLET | Freq: Every day | ORAL | 6 refills | Status: DC

## 2017-09-05 MED ORDER — GLUCOSE BLOOD VI STRP: ORAL_STRIP | 0 refills | Status: DC

## 2017-09-05 NOTE — Progress Notes (Signed)
Subjective:    Patient ID: [name], male    DOB: 1941/03/22, PCP Sharilyn Sites, MD   Past Medical History:  Diagnosis Date  . Anemia   . Biliary calculi, common bile duct   . CRI (chronic renal insufficiency)   . DM (diabetes mellitus) ([place])    Type II  . Elevated liver enzymes   . Esophageal varices ([place]) 03/19/11   mrcp  . History of ERCP 03/21/2011   Extrahepatic biliary obstruction secondary to occluded biliary stent and colon no cholelithiasis, status post sphincterotomy, stent removal and replacement and stone extraction.  Marland Kitchen HTN (hypertension)   . Hypothyroidism   . NASH (nonalcoholic steatohepatitis) 2006   liver transplant  . Pancreatitis chronic 03/19/11   mrcp  . S/P colonoscopy with polypectomy 2004   Dr Gala Romney  . S/P endoscopy 03/2004   Hx esophgaeal varices, pyloric channel ulcer prior to transplant  . Squamous cell cancer of external ear    pinna   Past Surgical History:  Procedure Laterality Date  . BILE DUCT STENT PLACEMENT  03/21/11   Dr. Gala Romney  . CHOLECYSTECTOMY  2006  . COLONOSCOPY  08/12/2002   Dr. Gala Romney- somewhat diffusely friable rectum and colon, polyp at 30 cm.  . COLONOSCOPY N/A 09/08/2012   Procedure: COLONOSCOPY;  Surgeon: Daneil Dolin, MD;  Location: [facility];  Service: Endoscopy;  Laterality: N/A;  10:30  . ERCP  03/21/2011   Dr. Gala Romney- ERCP with removal of pediatric feeding tube, endoscopic sphinecterotomy with balloon dilation of ampulla followed by stone extraction and stent placement  . ESOPHAGOGASTRODUODENOSCOPY  03/20/2004   Dr. Nash Mantis 2 esophageal varices o/w normal esophageal mucosa, mild changes of snake skin in the gastric mucosa consistent with portal gastropathy, multiple antral erosions  and  a single pyloric channel ulcer benign appearance o/w normal gastric mucosa, normal D1 and S2  . KNEE ARTHROSCOPY WITH EXCISION PLICA Left 08/05/7865   Procedure: KNEE ARTHROSCOPY WITH EXCISION PLICA;  Surgeon: Carole Civil,  MD;  Location: [facility];  Service: Orthopedics;  Laterality: Left;  . KNEE ARTHROSCOPY WITH MEDIAL MENISECTOMY Left 11/21/2012   Procedure: KNEE ARTHROSCOPY WITH MEDIAL AND LATERAL MENISECTOMY;  Surgeon: Carole Civil, MD;  Location: [facility];  Service: Orthopedics;  Laterality: Left;  . LIVER TRANSPLANT  2006   UVA-NASH cirrhosis  . MOUTH SURGERY  may 2012  . MRCP  03/19/11   biliary ductal dilatation- stones. esophageal varices  . SPHINCTEROTOMY  03/21/2011   Procedure: SPHINCTEROTOMY;  Surgeon: Daneil Dolin, MD;  Location: [facility];  Service: Gastroenterology;;   Social History   Socioeconomic History  . Marital status: Married    Spouse name: None  . Number of children: 1  . Years of education: 57  . Highest education level: None  Social Needs  . Financial resource strain: None  . Food insecurity - worry: None  . Food insecurity - inability: None  . Transportation needs - medical: None  . Transportation needs - non-medical: None  Occupational History  . Occupation: RETIRED    Employer: RETIRED  Tobacco Use  . Smoking status: Never Smoker  . Smokeless tobacco: Never Used  Substance and Sexual Activity  . Alcohol use: No  . Drug use: No  . Sexual activity: None  Other Topics Concern  . None  Social History Narrative  . None   Outpatient Encounter Medications as of 09/05/2017  Medication Sig  . aspirin 325 MG tablet Take 1 tablet (325  mg total) by mouth daily.  . Continuous Blood Gluc Sensor (FREESTYLE LIBRE SENSOR SYSTEM) MISC Use one sensor every 10 days.  Marland Kitchen docusate sodium (COLACE) 100 MG capsule Take 100 mg by mouth at bedtime.  Marland Kitchen glucose blood (ONE TOUCH ULTRA TEST) test strip USE AS DIRECTED TO TEST BLOOD SUGAR 4 TIMES DAILY. E11.65  . insulin aspart (NOVOLOG FLEXPEN) 100 UNIT/ML FlexPen Inject 12-18 Units into the skin 3 (three) times daily with meals. Give per sliding scale three times a day  . Insulin Glargine (LANTUS SOLOSTAR) 100 UNIT/ML Solostar Pen Inject 32  Units into the skin daily at 10 pm.  . levothyroxine (SYNTHROID, LEVOTHROID) 137 MCG tablet Take 1 tablet (137 mcg total) by mouth daily before breakfast.  . lisinopril (PRINIVIL,ZESTRIL) 20 MG tablet Take 20 mg by mouth daily.   . methocarbamol (ROBAXIN) 500 MG tablet Take 500 mg by mouth every 6 (six) hours as needed for muscle spasms.  . Multiple Vitamins-Minerals (MULTIVITAMIN WITH MINERALS) tablet Take 1 tablet by mouth daily.    Marland Kitchen oxyCODONE-acetaminophen (PERCOCET/ROXICET) 5-325 MG tablet Take 1 tablet by mouth every 4 (four) hours as needed.  Marland Kitchen Specialty Vitamins Products (MAGNESIUM, AMINO ACID CHELATE,) 133 MG tablet Take 1 tablet by mouth 2 (two) times daily. Magnesium 133mg    . tacrolimus (PROGRAF) 1 MG capsule Take 1 mg by mouth 2 (two) times daily.   . [DISCONTINUED] insulin aspart (NOVOLOG FLEXPEN) 100 UNIT/ML FlexPen Give per sliding scale three times a day  . [DISCONTINUED] Insulin Glargine (LANTUS SOLOSTAR) 100 UNIT/ML Solostar Pen Inject 32 Units into the skin daily at 10 pm.  . [DISCONTINUED] levothyroxine (SYNTHROID, LEVOTHROID) 137 MCG tablet Take 1 tablet (137 mcg total) by mouth daily before breakfast.  . [DISCONTINUED] ONE TOUCH ULTRA TEST test strip USE AS DIRECTED TO TEST BLOOD SUGAR 4 TIMES DAILY.   No facility-administered encounter medications on file as of 09/05/2017.    ALLERGIES: Allergies  Allergen Reactions  . Contrast Media [Iodinated Diagnostic Agents] Rash   VACCINATION STATUS: Immunization History  Administered Date(s) Administered  . Influenza Split 02/13/2011, 04/29/2012, 04/20/2013, 05/09/2013  . Influenza, High Dose Seasonal PF 04/26/2015, 04/13/2017  . Influenza-Unspecified 04/08/2013  . Pneumococcal Conjugate-13 07/29/2014  . Pneumococcal Polysaccharide-23 03/17/2010, 04/16/2011    Diabetes  He presents for his follow-up diabetic visit. He has type 2 diabetes mellitus. Onset time: He was diagnosed at approximate age of 89 years. His disease  course has been stable. There are no hypoglycemic associated symptoms. Pertinent negatives for hypoglycemia include no confusion, headaches, pallor or seizures. There are no diabetic associated symptoms. Pertinent negatives for diabetes include no chest pain, no fatigue, no polydipsia, no polyphagia, no polyuria and no weakness. There are no hypoglycemic complications. Symptoms are improving. Diabetic complications include a CVA. Risk factors for coronary artery disease include diabetes mellitus, dyslipidemia, hypertension, male sex, obesity, sedentary lifestyle and tobacco exposure. Current diabetic treatment includes intensive insulin program. He is compliant with treatment most of the time. His weight is stable. He is following a generally unhealthy diet. He rarely participates in exercise. His home blood glucose trend is decreasing steadily. His breakfast blood glucose range is generally 140-180 mg/dl. His lunch blood glucose range is generally 140-180 mg/dl. His dinner blood glucose range is generally 140-180 mg/dl. His overall blood glucose range is 140-180 mg/dl. An ACE inhibitor/angiotensin II receptor blocker is being taken. Eye exam is current.  Hyperlipidemia  This is a chronic problem. The current episode started more than 1 year ago. The  problem is controlled. Exacerbating diseases include diabetes, hypothyroidism and obesity. Pertinent negatives include no chest pain, myalgias or shortness of breath. He is currently on no antihyperlipidemic treatment. Risk factors for coronary artery disease include dyslipidemia, diabetes mellitus, hypertension, male sex, obesity and a sedentary lifestyle.  Hypertension  This is a chronic problem. The current episode started more than 1 year ago. The problem is uncontrolled. Pertinent negatives include no chest pain, headaches, neck pain, palpitations or shortness of breath. Risk factors for coronary artery disease include dyslipidemia, family history, obesity,  male gender and sedentary lifestyle. Past treatments include ACE inhibitors. Hypertensive end-organ damage includes CVA. He is status post liver transplant.. Identifiable causes of hypertension include a thyroid problem.  Thyroid Problem  Presents for follow-up visit. Patient reports no constipation, diarrhea, fatigue or palpitations. The symptoms have been improving. Past treatments include levothyroxine. The following procedures have not been performed: thyroidectomy. His past medical history is significant for diabetes and hyperlipidemia.     Review of Systems  Constitutional: Negative for fatigue and unexpected weight change.  HENT: Negative for dental problem, mouth sores and trouble swallowing.   Eyes: Negative for visual disturbance.  Respiratory: Negative for cough, choking, chest tightness, shortness of breath and wheezing.   Cardiovascular: Negative for chest pain, palpitations and leg swelling.  Gastrointestinal: Negative for abdominal distention, abdominal pain, constipation, diarrhea, nausea and vomiting.  Endocrine: Negative for polydipsia, polyphagia and polyuria.  Genitourinary: Negative for dysuria, flank pain, hematuria and urgency.  Musculoskeletal: Negative for back pain, gait problem, myalgias and neck pain.  Skin: Negative for pallor, rash and wound.  Neurological: Negative for seizures, syncope, weakness, numbness and headaches.  Psychiatric/Behavioral: Negative for confusion and dysphoric mood.    Objective:    BP (!) 141/84   Pulse 86   Ht 5' 9.5" (1.765 m)   Wt 260 lb (117.9 kg)   BMI 37.84 kg/m   Wt Readings from Last 3 Encounters:  09/05/17 260 lb (117.9 kg)  06/03/17 259 lb (117.5 kg)  04/16/17 259 lb 15.1 oz (117.9 kg)    Physical Exam  Constitutional: He is oriented to person, place, and time. He appears well-developed. He is cooperative. No distress.  HENT:  Head: Normocephalic and atraumatic.  Eyes: EOM are normal.  Neck: Normal range of motion.  Neck supple. No tracheal deviation present. No thyromegaly present.  Cardiovascular: Normal rate, S1 normal, S2 normal and normal heart sounds. Exam reveals no gallop.  No murmur heard. Pulses:      Dorsalis pedis pulses are 1+ on the right side, and 1+ on the left side.       Posterior tibial pulses are 1+ on the right side, and 1+ on the left side.  Pulmonary/Chest: Breath sounds normal. No respiratory distress. He has no wheezes.  Abdominal: Soft. Bowel sounds are normal. He exhibits no distension. There is no tenderness. There is no guarding and no CVA tenderness.  Musculoskeletal: He exhibits no edema.       Right shoulder: He exhibits no swelling and no deformity.  Neurological: He is alert and oriented to person, place, and time. He has normal strength and normal reflexes. No cranial nerve deficit or sensory deficit. Gait normal.  Skin: Skin is warm and dry. No rash noted. No cyanosis. Nails show no clubbing.  Psychiatric: He has a normal mood and affect. His speech is normal. Cognition and memory are normal.    Results for orders placed or performed in visit on 53/61/44  Basic metabolic  panel  Result Value Ref Range   BUN 23 (A) 4 - 21   Creatinine 1.4 (A) 0.6 - 1.3  Lipid panel  Result Value Ref Range   Triglycerides 113 40 - 160   Cholesterol 131 0 - 200   HDL 35 35 - 70   LDL Cholesterol 73    Diabetic Labs (most recent): Lab Results  Component Value Date   HGBA1C 7.0 (H) 08/12/2017   HGBA1C 7.0 (H) 05/14/2017   HGBA1C 6.8 (H) 04/13/2017   Results for [name], [name] (MRN [id number]) as of 06/03/2017 09:04  Ref. Range 04/18/2017 07:00 05/14/2017 08:07  TSH Latest Ref Range: 0.450 - 4.500 uIU/mL 2.535 3.230  T4,Free(Direct) Latest Ref Range: 0.82 - 1.77 ng/dL 1.14 (H) 1.14     Assessment & Plan:   1. Uncontrolled type 2 diabetes mellitus with complication, with long-term current use of insulin ([facility])-his labs showing elevated serum creatinine of 1.39 with GFR of  49  -Recently he had developed CVA requiring rehabilitation and physical therapy. He remains at a high risk for more acute and chronic complications of diabetes which include CAD, CVA, CKD, retinopathy, and neuropathy. These are all discussed in detail with the patient.  Patient came with stable A1c of   7% .  - His most recent blood glucose readings are near target.  Glucose logs and insulin administration records pertaining to this visit,  to be scanned into patient's records.  Recent labs reviewed.   - I have re-counseled the patient on diet management   by adopting a carbohydrate restricted / protein rich  Diet.  -  Suggestion is made for him to avoid simple carbohydrates  from his diet including Cakes, Sweet Desserts / Pastries, Ice Cream, Soda (diet and regular), Sweet Tea, Candies, Chips, Cookies, Store Bought Juices, Alcohol in Excess of  1-2 drinks a day, Artificial Sweeteners, and "Sugar-free" Products. This will help patient to have stable blood glucose profile and potentially avoid unintended weight gain.  - Patient is advised to stick to a routine mealtimes to eat 3 meals  a day and avoid unnecessary snacks ( to snack only to correct hypoglycemia).  - I have approached patient with the following individualized plan to manage diabetes and patient agrees.  - I'll continue Lantus 32 units nightly, NovoLog 12 units  3 times a day before meals plus correction. - He is  advised to continue strict monitoring of blood glucose 4 times a day-before meals and at bedtime and as needed. - He would have benefited  from continuous glucose monitoring, however he decided not to get it because he cannot afford the monthly co-pay.    - I advised him to contact me for hypoglycemia or hyperglycemia above 200. - Adjustments for hypo-and hyperglycemia is given in written instructions to the patient.  -Patient is not a candidate for metformin, SGLT2 inhibitors, and incretin in therapy due to history of  liver transplant.  - Patient specific target  for A1c; LDL, HDL, Triglycerides, and  Waist Circumference were discussed in detail.  2) BP/HTN: His blood pressure is slightly above target.   Continue current medications including ACEI/ARB. 3) Lipids/HPL: His lipid panel is controlled with LDL at 73.  Patient is not on statins.   4)  Weight/Diet:  exercise, and carbohydrates information provided.  5) Primary hypothyroidism:  His thyroid function tests are consistent with appropriate  replacement. I will continue  levothyroxine  137 g  by mouth every morning.  - We discussed  about correct intake of levothyroxine, at fasting, with water, separated by at least 30 minutes from breakfast, and separated by more than 4 hours from calcium, iron, multivitamins, acid reflux medications (PPIs). -Patient is made aware of the fact that thyroid hormone replacement is needed for life, dose to be adjusted by periodic monitoring of thyroid function tests.  5) Chronic Care/Health Maintenance:  -Patient is on ACEI/ARB  medications and encouraged to continue to follow up with Ophthalmology, Podiatrist at least yearly or according to recommendations, and advised to  stay away from smoking. I have recommended yearly flu vaccine and pneumonia vaccination at least every 5 years; and  sleep for at least 7 hours a day.  - I advised patient to maintain close follow up with Sharilyn Sites, MD for primary care needs. - Time spent with the patient: 25 min, of which >50% was spent in reviewing his blood glucose logs , discussing his hypo- and hyper-glycemic episodes, reviewing his current and  previous labs and insulin doses and developing a plan to avoid hypo- and hyper-glycemia. Please refer to Patient Instructions for Blood Glucose Monitoring and Insulin/Medications Dosing Guide"  in media tab for additional information.    Follow up plan: -Return in about 4 months (around 01/03/2018) for follow up with pre-visit labs,  meter, and logs.  Glade Lloyd, MD Phone: [phone]  Fax: [phone]  -  This note was partially dictated with voice recognition software. Similar sounding words can be transcribed inadequately or may not  be corrected upon review.  09/05/2017, 9:52 AM

## 2017-09-05 NOTE — Patient Instructions (Signed)

## 2017-09-16 DIAGNOSIS — E782 Mixed hyperlipidemia: Secondary | ICD-10-CM | POA: Diagnosis not present

## 2017-09-27 ENCOUNTER — Other Ambulatory Visit: Payer: Self-pay | Admitting: "Endocrinology

## 2017-09-27 DIAGNOSIS — E1122 Type 2 diabetes mellitus with diabetic chronic kidney disease: Secondary | ICD-10-CM | POA: Diagnosis not present

## 2017-09-27 DIAGNOSIS — N183 Chronic kidney disease, stage 3 (moderate): Secondary | ICD-10-CM | POA: Diagnosis not present

## 2017-09-27 DIAGNOSIS — E039 Hypothyroidism, unspecified: Secondary | ICD-10-CM | POA: Diagnosis not present

## 2017-09-27 DIAGNOSIS — Z794 Long term (current) use of insulin: Secondary | ICD-10-CM | POA: Diagnosis not present

## 2017-09-28 LAB — COMPREHENSIVE METABOLIC PANEL
A/G RATIO: 1.2 (ref 1.2–2.2)
ALT: 21 IU/L (ref 0–44)
AST: 19 IU/L (ref 0–40)
Albumin: 3.8 g/dL (ref 3.5–4.8)
Alkaline Phosphatase: 75 IU/L (ref 39–117)
BUN/Creatinine Ratio: 22 (ref 10–24)
BUN: 30 mg/dL — AB (ref 8–27)
Bilirubin Total: 0.7 mg/dL (ref 0.0–1.2)
CALCIUM: 9.1 mg/dL (ref 8.6–10.2)
CO2: 23 mmol/L (ref 20–29)
CREATININE: 1.39 mg/dL — AB (ref 0.76–1.27)
Chloride: 107 mmol/L — ABNORMAL HIGH (ref 96–106)
GFR calc Af Amer: 57 mL/min/{1.73_m2} — ABNORMAL LOW (ref >59)
GFR calc non Af Amer: 49 mL/min/{1.73_m2} — ABNORMAL LOW (ref >59)
GLUCOSE: 280 mg/dL — AB (ref 65–99)
Globulin, Total: 3.2 g/dL (ref 1.5–4.5)
POTASSIUM: 4.8 mmol/L (ref 3.5–5.2)
Sodium: 140 mmol/L (ref 134–144)
TOTAL PROTEIN: 7 g/dL (ref 6.0–8.5)

## 2017-09-28 LAB — SPECIMEN STATUS REPORT

## 2017-09-28 LAB — TSH: TSH: 2.23 u[IU]/mL (ref 0.450–4.500)

## 2017-09-28 LAB — T4, FREE: Free T4: 1.44 ng/dL (ref 0.82–1.77)

## 2017-11-07 DIAGNOSIS — R269 Unspecified abnormalities of gait and mobility: Secondary | ICD-10-CM | POA: Diagnosis not present

## 2017-11-07 DIAGNOSIS — M1712 Unilateral primary osteoarthritis, left knee: Secondary | ICD-10-CM | POA: Diagnosis not present

## 2017-11-07 DIAGNOSIS — M170 Bilateral primary osteoarthritis of knee: Secondary | ICD-10-CM | POA: Diagnosis not present

## 2017-11-07 DIAGNOSIS — M25562 Pain in left knee: Secondary | ICD-10-CM | POA: Diagnosis not present

## 2017-11-07 DIAGNOSIS — Z789 Other specified health status: Secondary | ICD-10-CM | POA: Diagnosis not present

## 2017-11-07 DIAGNOSIS — M21162 Varus deformity, not elsewhere classified, left knee: Secondary | ICD-10-CM | POA: Diagnosis not present

## 2017-11-11 ENCOUNTER — Other Ambulatory Visit: Payer: Self-pay | Admitting: "Endocrinology

## 2017-11-11 DIAGNOSIS — Z944 Liver transplant status: Secondary | ICD-10-CM | POA: Diagnosis not present

## 2017-11-11 DIAGNOSIS — Z794 Long term (current) use of insulin: Secondary | ICD-10-CM | POA: Diagnosis not present

## 2017-11-11 DIAGNOSIS — E039 Hypothyroidism, unspecified: Secondary | ICD-10-CM | POA: Diagnosis not present

## 2017-11-11 DIAGNOSIS — E1122 Type 2 diabetes mellitus with diabetic chronic kidney disease: Secondary | ICD-10-CM | POA: Diagnosis not present

## 2017-11-11 DIAGNOSIS — N183 Chronic kidney disease, stage 3 (moderate): Secondary | ICD-10-CM | POA: Diagnosis not present

## 2017-11-12 LAB — COMPREHENSIVE METABOLIC PANEL
A/G RATIO: 1.4 (ref 1.2–2.2)
ALT: 17 IU/L (ref 0–44)
AST: 13 IU/L (ref 0–40)
Albumin: 4 g/dL (ref 3.5–4.8)
Alkaline Phosphatase: 76 IU/L (ref 39–117)
BUN / CREAT RATIO: 27 — AB (ref 10–24)
BUN: 37 mg/dL — ABNORMAL HIGH (ref 8–27)
Bilirubin Total: 1 mg/dL (ref 0.0–1.2)
CALCIUM: 9.4 mg/dL (ref 8.6–10.2)
CHLORIDE: 103 mmol/L (ref 96–106)
CO2: 24 mmol/L (ref 20–29)
CREATININE: 1.39 mg/dL — AB (ref 0.76–1.27)
GFR calc Af Amer: 57 mL/min/{1.73_m2} — ABNORMAL LOW (ref >59)
GFR calc non Af Amer: 49 mL/min/{1.73_m2} — ABNORMAL LOW (ref >59)
GLOBULIN, TOTAL: 2.9 g/dL (ref 1.5–4.5)
Glucose: 181 mg/dL — ABNORMAL HIGH (ref 65–99)
POTASSIUM: 4.9 mmol/L (ref 3.5–5.2)
SODIUM: 141 mmol/L (ref 134–144)
TOTAL PROTEIN: 6.9 g/dL (ref 6.0–8.5)

## 2017-11-12 LAB — HGB A1C W/O EAG: HEMOGLOBIN A1C: 7.7 % — AB (ref 4.8–5.6)

## 2017-11-12 LAB — TSH: TSH: 1.99 u[IU]/mL (ref 0.450–4.500)

## 2017-11-12 LAB — T4: T4, Total: 5.8 ug/dL (ref 4.5–12.0)

## 2017-11-25 DIAGNOSIS — Z6839 Body mass index (BMI) 39.0-39.9, adult: Secondary | ICD-10-CM | POA: Diagnosis not present

## 2017-11-25 DIAGNOSIS — M1712 Unilateral primary osteoarthritis, left knee: Secondary | ICD-10-CM | POA: Diagnosis not present

## 2017-11-25 DIAGNOSIS — J22 Unspecified acute lower respiratory infection: Secondary | ICD-10-CM | POA: Diagnosis not present

## 2017-11-25 DIAGNOSIS — H6990 Unspecified Eustachian tube disorder, unspecified ear: Secondary | ICD-10-CM | POA: Diagnosis not present

## 2017-11-25 DIAGNOSIS — Z1389 Encounter for screening for other disorder: Secondary | ICD-10-CM | POA: Diagnosis not present

## 2017-11-25 DIAGNOSIS — M25562 Pain in left knee: Secondary | ICD-10-CM | POA: Diagnosis not present

## 2017-12-03 DIAGNOSIS — M1712 Unilateral primary osteoarthritis, left knee: Secondary | ICD-10-CM | POA: Diagnosis not present

## 2017-12-03 DIAGNOSIS — M25562 Pain in left knee: Secondary | ICD-10-CM | POA: Diagnosis not present

## 2017-12-09 DIAGNOSIS — M25562 Pain in left knee: Secondary | ICD-10-CM | POA: Diagnosis not present

## 2017-12-09 DIAGNOSIS — M1712 Unilateral primary osteoarthritis, left knee: Secondary | ICD-10-CM | POA: Diagnosis not present

## 2018-01-03 ENCOUNTER — Encounter: Payer: Self-pay | Admitting: "Endocrinology

## 2018-01-03 ENCOUNTER — Ambulatory Visit (INDEPENDENT_AMBULATORY_CARE_PROVIDER_SITE_OTHER): Payer: PPO | Admitting: "Endocrinology

## 2018-01-03 VITALS — BP 146/84 | HR 79 | Ht 69.5 in | Wt 258.0 lb

## 2018-01-03 DIAGNOSIS — I10 Essential (primary) hypertension: Secondary | ICD-10-CM | POA: Diagnosis not present

## 2018-01-03 DIAGNOSIS — E1122 Type 2 diabetes mellitus with diabetic chronic kidney disease: Secondary | ICD-10-CM

## 2018-01-03 DIAGNOSIS — Z794 Long term (current) use of insulin: Secondary | ICD-10-CM

## 2018-01-03 DIAGNOSIS — E782 Mixed hyperlipidemia: Secondary | ICD-10-CM | POA: Diagnosis not present

## 2018-01-03 DIAGNOSIS — N183 Chronic kidney disease, stage 3 unspecified: Secondary | ICD-10-CM

## 2018-01-03 DIAGNOSIS — E039 Hypothyroidism, unspecified: Secondary | ICD-10-CM

## 2018-01-03 DIAGNOSIS — E1129 Type 2 diabetes mellitus with other diabetic kidney complication: Secondary | ICD-10-CM | POA: Diagnosis not present

## 2018-01-03 MED ORDER — INSULIN GLARGINE 100 UNIT/ML SOLOSTAR PEN: 34.0000 [IU] | PEN_INJECTOR | Freq: Every day | SUBCUTANEOUS | 2 refills | Status: DC

## 2018-01-03 NOTE — Progress Notes (Signed)
Subjective:    Patient ID: [name], male    DOB: [birth year], PCP Sharilyn Sites, MD   Past Medical History:  Diagnosis Date  . Anemia   . Biliary calculi, common bile duct   . CRI (chronic renal insufficiency)   . DM (diabetes mellitus) ([place])    Type II  . Elevated liver enzymes   . Esophageal varices ([place]) 03/19/11   mrcp  . History of ERCP 03/21/2011   Extrahepatic biliary obstruction secondary to occluded biliary stent and colon no cholelithiasis, status post sphincterotomy, stent removal and replacement and stone extraction.  Marland Kitchen HTN (hypertension)   . Hypothyroidism   . NASH (nonalcoholic steatohepatitis) 2006   liver transplant  . Pancreatitis chronic 03/19/11   mrcp  . S/P colonoscopy with polypectomy 2004   Dr Gala Romney  . S/P endoscopy 03/2004   Hx esophgaeal varices, pyloric channel ulcer prior to transplant  . Squamous cell cancer of external ear    pinna   Past Surgical History:  Procedure Laterality Date  . BILE DUCT STENT PLACEMENT  03/21/11   Dr. Gala Romney  . CHOLECYSTECTOMY  2006  . COLONOSCOPY  08/12/2002   Dr. Gala Romney- somewhat diffusely friable rectum and colon, polyp at 30 cm.  . COLONOSCOPY N/A 09/08/2012   Procedure: COLONOSCOPY;  Surgeon: Daneil Dolin, MD;  Location: [facility];  Service: Endoscopy;  Laterality: N/A;  10:30  . ERCP  03/21/2011   Dr. Gala Romney- ERCP with removal of pediatric feeding tube, endoscopic sphinecterotomy with balloon dilation of ampulla followed by stone extraction and stent placement  . ESOPHAGOGASTRODUODENOSCOPY  03/20/2004   Dr. Nash Mantis 2 esophageal varices o/w normal esophageal mucosa, mild changes of snake skin in the gastric mucosa consistent with portal gastropathy, multiple antral erosions  and  a single pyloric channel ulcer benign appearance o/w normal gastric mucosa, normal D1 and S2  . KNEE ARTHROSCOPY WITH EXCISION PLICA Left 7/34/2876   Procedure: KNEE ARTHROSCOPY WITH EXCISION PLICA;  Surgeon: Carole Civil,  MD;  Location: [facility];  Service: Orthopedics;  Laterality: Left;  . KNEE ARTHROSCOPY WITH MEDIAL MENISECTOMY Left 11/21/2012   Procedure: KNEE ARTHROSCOPY WITH MEDIAL AND LATERAL MENISECTOMY;  Surgeon: Carole Civil, MD;  Location: [facility];  Service: Orthopedics;  Laterality: Left;  . LIVER TRANSPLANT  2006   UVA-NASH cirrhosis  . MOUTH SURGERY  may 2012  . MRCP  03/19/11   biliary ductal dilatation- stones. esophageal varices  . SPHINCTEROTOMY  03/21/2011   Procedure: SPHINCTEROTOMY;  Surgeon: Daneil Dolin, MD;  Location: [facility];  Service: Gastroenterology;;   Social History   Socioeconomic History  . Marital status: Married    Spouse name: Not on file  . Number of children: 1  . Years of education: 21  . Highest education level: Not on file  Occupational History  . Occupation: RETIRED    Employer: RETIRED  Social Needs  . Financial resource strain: Not on file  . Food insecurity:    Worry: Not on file    Inability: Not on file  . Transportation needs:    Medical: Not on file    Non-medical: Not on file  Tobacco Use  . Smoking status: Never Smoker  . Smokeless tobacco: Never Used  Substance and Sexual Activity  . Alcohol use: No  . Drug use: No  . Sexual activity: Not on file  Lifestyle  . Physical activity:    Days per week: Not on file    Minutes  per session: Not on file  . Stress: Not on file  Relationships  . Social connections:    Talks on phone: Not on file    Gets together: Not on file    Attends religious service: Not on file    Active member of club or organization: Not on file    Attends meetings of clubs or organizations: Not on file    Relationship status: Not on file  Other Topics Concern  . Not on file  Social History Narrative  . Not on file   Outpatient Encounter Medications as of 01/03/2018  Medication Sig  . aspirin 325 MG tablet Take 1 tablet (325 mg total) by mouth daily.  . Continuous Blood Gluc Sensor (FREESTYLE LIBRE SENSOR SYSTEM)  MISC Use one sensor every 10 days.  Marland Kitchen docusate sodium (COLACE) 100 MG capsule Take 100 mg by mouth at bedtime.  Marland Kitchen glucose blood (ONE TOUCH ULTRA TEST) test strip USE AS DIRECTED TO TEST BLOOD SUGAR 4 TIMES DAILY. E11.65  . insulin aspart (NOVOLOG FLEXPEN) 100 UNIT/ML FlexPen Inject 12-18 Units into the skin 3 (three) times daily with meals. Give per sliding scale three times a day  . Insulin Glargine (LANTUS SOLOSTAR) 100 UNIT/ML Solostar Pen Inject 34 Units into the skin daily at 10 pm.  . levothyroxine (SYNTHROID, LEVOTHROID) 137 MCG tablet Take 1 tablet (137 mcg total) by mouth daily before breakfast.  . lisinopril (PRINIVIL,ZESTRIL) 20 MG tablet Take 20 mg by mouth daily.   . methocarbamol (ROBAXIN) 500 MG tablet Take 500 mg by mouth every 6 (six) hours as needed for muscle spasms.  . Multiple Vitamins-Minerals (MULTIVITAMIN WITH MINERALS) tablet Take 1 tablet by mouth daily.    Marland Kitchen oxyCODONE-acetaminophen (PERCOCET/ROXICET) 5-325 MG tablet Take 1 tablet by mouth every 4 (four) hours as needed.  Marland Kitchen Specialty Vitamins Products (MAGNESIUM, AMINO ACID CHELATE,) 133 MG tablet Take 1 tablet by mouth 2 (two) times daily. Magnesium 133mg    . tacrolimus (PROGRAF) 1 MG capsule Take 1 mg by mouth 2 (two) times daily.   . [DISCONTINUED] Insulin Glargine (LANTUS SOLOSTAR) 100 UNIT/ML Solostar Pen Inject 32 Units into the skin daily at 10 pm.   No facility-administered encounter medications on file as of 01/03/2018.    ALLERGIES: Allergies  Allergen Reactions  . Contrast Media [Iodinated Diagnostic Agents] Rash   VACCINATION STATUS: Immunization History  Administered Date(s) Administered  . Influenza Split 02/13/2011, 04/29/2012, 04/20/2013, 05/09/2013  . Influenza, High Dose Seasonal PF 04/26/2015, 04/13/2017  . Influenza-Unspecified 04/08/2013  . Pneumococcal Conjugate-13 07/29/2014  . Pneumococcal Polysaccharide-23 03/17/2010, 04/16/2011    Diabetes  He presents for his follow-up diabetic  visit. He has type 2 diabetes mellitus. Onset time: He was diagnosed at approximate age of 58 years. His disease course has been worsening. There are no hypoglycemic associated symptoms. Pertinent negatives for hypoglycemia include no confusion, headaches, pallor or seizures. There are no diabetic associated symptoms. Pertinent negatives for diabetes include no chest pain, no fatigue, no polydipsia, no polyphagia, no polyuria and no weakness. There are no hypoglycemic complications. Symptoms are worsening. Diabetic complications include a CVA. Risk factors for coronary artery disease include diabetes mellitus, dyslipidemia, hypertension, male sex, obesity, sedentary lifestyle and tobacco exposure. Current diabetic treatment includes intensive insulin program. He is compliant with treatment most of the time. His weight is stable. He is following a generally unhealthy diet. He rarely participates in exercise. His home blood glucose trend is decreasing steadily. His breakfast blood glucose range is generally 140-180 mg/dl.  His lunch blood glucose range is generally 140-180 mg/dl. His dinner blood glucose range is generally 140-180 mg/dl. His overall blood glucose range is 140-180 mg/dl. An ACE inhibitor/angiotensin II receptor blocker is being taken. Eye exam is current.  Hyperlipidemia  This is a chronic problem. The current episode started more than 1 year ago. The problem is controlled. Exacerbating diseases include diabetes, hypothyroidism and obesity. Pertinent negatives include no chest pain, myalgias or shortness of breath. He is currently on no antihyperlipidemic treatment. Risk factors for coronary artery disease include dyslipidemia, diabetes mellitus, hypertension, male sex, obesity and a sedentary lifestyle.  Hypertension  This is a chronic problem. The current episode started more than 1 year ago. The problem is uncontrolled. Pertinent negatives include no chest pain, headaches, neck pain, palpitations  or shortness of breath. Risk factors for coronary artery disease include dyslipidemia, family history, obesity, male gender and sedentary lifestyle. Past treatments include ACE inhibitors. Hypertensive end-organ damage includes CVA. He is status post liver transplant.. Identifiable causes of hypertension include a thyroid problem.  Thyroid Problem  Presents for follow-up visit. Patient reports no constipation, diarrhea, fatigue or palpitations. The symptoms have been improving. Past treatments include levothyroxine. The following procedures have not been performed: thyroidectomy. His past medical history is significant for diabetes and hyperlipidemia.     Review of Systems  Constitutional: Negative for fatigue and unexpected weight change.  HENT: Negative for dental problem, mouth sores and trouble swallowing.   Eyes: Negative for visual disturbance.  Respiratory: Negative for cough, choking, chest tightness, shortness of breath and wheezing.   Cardiovascular: Negative for chest pain, palpitations and leg swelling.  Gastrointestinal: Negative for abdominal distention, abdominal pain, constipation, diarrhea, nausea and vomiting.  Endocrine: Negative for polydipsia, polyphagia and polyuria.  Genitourinary: Negative for dysuria, flank pain, hematuria and urgency.  Musculoskeletal: Negative for back pain, gait problem, myalgias and neck pain.  Skin: Negative for pallor, rash and wound.  Neurological: Negative for seizures, syncope, weakness, numbness and headaches.  Psychiatric/Behavioral: Negative for confusion and dysphoric mood.    Objective:    BP (!) 146/84   Pulse 79   Ht 5' 9.5" (1.765 m)   Wt 258 lb (117 kg)   BMI 37.55 kg/m   Wt Readings from Last 3 Encounters:  01/03/18 258 lb (117 kg)  09/05/17 260 lb (117.9 kg)  06/03/17 259 lb (117.5 kg)    Physical Exam  Constitutional: He is oriented to person, place, and time. He appears well-developed. He is cooperative. No distress.   HENT:  Head: Normocephalic and atraumatic.  Eyes: EOM are normal.  Neck: Normal range of motion. Neck supple. No tracheal deviation present. No thyromegaly present.  Cardiovascular: Normal rate, S1 normal and S2 normal. Exam reveals no gallop.  No murmur heard. Pulses:      Dorsalis pedis pulses are 1+ on the right side, and 1+ on the left side.       Posterior tibial pulses are 1+ on the right side, and 1+ on the left side.  Pulmonary/Chest: Effort normal. No respiratory distress. He has no wheezes.  Abdominal: He exhibits no distension. There is no tenderness. There is no guarding and no CVA tenderness.  Musculoskeletal: He exhibits no edema.       Right shoulder: He exhibits no swelling and no deformity.  Neurological: He is alert and oriented to person, place, and time. He has normal strength and normal reflexes. No cranial nerve deficit or sensory deficit. Gait normal.  Skin: Skin is  warm and dry. No rash noted. No cyanosis. Nails show no clubbing.  Psychiatric: He has a normal mood and affect. His speech is normal. Cognition and memory are normal.    Results for orders placed or performed in visit on 11/11/17  Comprehensive metabolic panel  Result Value Ref Range   Glucose 181 (H) 65 - 99 mg/dL   BUN 37 (H) 8 - 27 mg/dL   Creatinine, Ser 1.39 (H) 0.76 - 1.27 mg/dL   GFR calc non Af Amer 49 (L) >59 mL/min/1.73   GFR calc Af Amer 57 (L) >59 mL/min/1.73   BUN/Creatinine Ratio 27 (H) 10 - 24   Sodium 141 134 - 144 mmol/L   Potassium 4.9 3.5 - 5.2 mmol/L   Chloride 103 96 - 106 mmol/L   CO2 24 20 - 29 mmol/L   Calcium 9.4 8.6 - 10.2 mg/dL   Total Protein 6.9 6.0 - 8.5 g/dL   Albumin 4.0 3.5 - 4.8 g/dL   Globulin, Total 2.9 1.5 - 4.5 g/dL   Albumin/Globulin Ratio 1.4 1.2 - 2.2   Bilirubin Total 1.0 0.0 - 1.2 mg/dL   Alkaline Phosphatase 76 39 - 117 IU/L   AST 13 0 - 40 IU/L   ALT 17 0 - 44 IU/L  Hgb A1c w/o eAG  Result Value Ref Range   Hgb A1c MFr Bld 7.7 (H) 4.8 - 5.6 %   TSH  Result Value Ref Range   TSH 1.990 0.450 - 4.500 uIU/mL  T4  Result Value Ref Range   T4, Total 5.8 4.5 - 12.0 ug/dL   Diabetic Labs (most recent): Lab Results  Component Value Date   HGBA1C 7.7 (H) 11/11/2017   HGBA1C 7.0 (H) 08/12/2017   HGBA1C 7.0 (H) 05/14/2017      Assessment & Plan:   1. Uncontrolled type 2 diabetes mellitus with complication, with long-term current use of insulin ([facility]CC)-his labs showing elevated serum creatinine of 1.39 with GFR of 49  -Recently he had developed CVA requiring rehabilitation and physical therapy. He remains at a high risk for more acute and chronic complications of diabetes which include CAD, CVA, CKD, retinopathy, and neuropathy. These are all discussed in detail with the patient.  Patient came with higher A1c of 7.7% as a result of his interval treatment with steroids for arthritis.    - His most recent blood glucose readings are near target.  Glucose logs and insulin administration records pertaining to this visit,  to be scanned into patient's records.  Recent labs reviewed.   - I have re-counseled the patient on diet management   by adopting a carbohydrate restricted / protein rich  Diet.  -  Suggestion is made for him to avoid simple carbohydrates  from his diet including Cakes, Sweet Desserts / Pastries, Ice Cream, Soda (diet and regular), Sweet Tea, Candies, Chips, Cookies, Store Bought Juices, Alcohol in Excess of  1-2 drinks a day, Artificial Sweeteners, and "Sugar-free" Products. This will help patient to have stable blood glucose profile and potentially avoid unintended weight gain.   - Patient is advised to stick to a routine mealtimes to eat 3 meals  a day and avoid unnecessary snacks ( to snack only to correct hypoglycemia).  - I have approached patient with the following individualized plan to manage diabetes and patient agrees.  -Advised him to increase his Lantus to 34 units nightly, continue NovoLog 12 units  3  times a day before meals plus correction. - He is  advised  to continue strict monitoring of blood glucose 4 times a day-before meals and at bedtime and as needed. - He would have benefited  from continuous glucose monitoring, however he decided not to get it because he cannot afford the monthly co-pay.    - I advised him to contact me for hypoglycemia or hyperglycemia above 200. - Adjustments for hypo-and hyperglycemia is given in written instructions to the patient.  -He is not  a candidate for metformin, SGLT2 inhibitors, and incretin in therapy due to history of liver transplant.  - Patient specific target  for A1c; LDL, HDL, Triglycerides, and  Waist Circumference were discussed in detail.  2) BP/HTN: His blood pressure is not controlled to target.  He is advised to continue his current blood pressure medications including lisinopril 20 mg p.o. daily.  3) Lipids/HPL: His lipid panel is controlled with LDL at 73.  He is not on statins, as a result of his history of liver transplant.   4)  Weight/Diet:  exercise, and carbohydrates information provided.  5) Primary hypothyroidism:  His recent thyroid function tests are consistent with appropriate replacement.  -I advised him to continue levothyroxine 137 mcg p.o. daily before breakfast.    - - We discussed about correct intake of levothyroxine, at fasting, with water, separated by at least 30 minutes from breakfast, and separated by more than 4 hours from calcium, iron, multivitamins, acid reflux medications (PPIs). -Patient is made aware of the fact that thyroid hormone replacement is needed for life, dose to be adjusted by periodic monitoring of thyroid function tests.    5) Chronic Care/Health Maintenance:  -Patient is on ACEI/ARB  medications and encouraged to continue to follow up with Ophthalmology, Podiatrist at least yearly or according to recommendations, and advised to  stay away from smoking. I have recommended yearly flu vaccine  and pneumonia vaccination at least every 5 years; and  sleep for at least 7 hours a day.  - I advised patient to maintain close follow up with Sharilyn Sites, MD for primary care needs.  - Time spent with the patient: 25 min, of which >50% was spent in reviewing his blood glucose logs , discussing his hypo- and hyper-glycemic episodes, reviewing his current and  previous labs and insulin doses and developing a plan to avoid hypo- and hyper-glycemia. Please refer to Patient Instructions for Blood Glucose Monitoring and Insulin/Medications Dosing Guide"  in media tab for additional information. [name] participated in the discussions, expressed understanding, and voiced agreement with the above plans.  All questions were answered to his satisfaction. he is encouraged to contact clinic should he have any questions or concerns prior to his return visit.   Follow up plan: -Return in about 4 months (around 05/05/2018) for follow up with pre-visit labs, meter, and logs.  Glade Lloyd, MD Phone: [phone]  Fax: [phone]  -  This note was partially dictated with voice recognition software. Similar sounding words can be transcribed inadequately or may not  be corrected upon review.  01/03/2018, 6:37 PM

## 2018-01-03 NOTE — Patient Instructions (Signed)

## 2018-01-23 ENCOUNTER — Other Ambulatory Visit: Payer: Self-pay | Admitting: "Endocrinology

## 2018-02-10 ENCOUNTER — Other Ambulatory Visit: Payer: Self-pay | Admitting: "Endocrinology

## 2018-02-10 DIAGNOSIS — E1122 Type 2 diabetes mellitus with diabetic chronic kidney disease: Secondary | ICD-10-CM | POA: Diagnosis not present

## 2018-02-10 DIAGNOSIS — Z794 Long term (current) use of insulin: Secondary | ICD-10-CM | POA: Diagnosis not present

## 2018-02-10 DIAGNOSIS — Z944 Liver transplant status: Secondary | ICD-10-CM | POA: Diagnosis not present

## 2018-02-10 DIAGNOSIS — E039 Hypothyroidism, unspecified: Secondary | ICD-10-CM | POA: Diagnosis not present

## 2018-02-10 DIAGNOSIS — N183 Chronic kidney disease, stage 3 (moderate): Secondary | ICD-10-CM | POA: Diagnosis not present

## 2018-02-10 DIAGNOSIS — R7309 Other abnormal glucose: Secondary | ICD-10-CM | POA: Diagnosis not present

## 2018-02-11 LAB — VITAMIN D 25 HYDROXY (VIT D DEFICIENCY, FRACTURES): Vit D, 25-Hydroxy: 32.9 ng/mL (ref 30.0–100.0)

## 2018-02-11 LAB — COMPREHENSIVE METABOLIC PANEL
A/G RATIO: 1.4 (ref 1.2–2.2)
ALBUMIN: 3.9 g/dL (ref 3.5–4.8)
ALK PHOS: 77 IU/L (ref 39–117)
ALT: 14 IU/L (ref 0–44)
AST: 17 IU/L (ref 0–40)
BILIRUBIN TOTAL: 0.7 mg/dL (ref 0.0–1.2)
BUN / CREAT RATIO: 16 (ref 10–24)
BUN: 21 mg/dL (ref 8–27)
CO2: 25 mmol/L (ref 20–29)
Calcium: 9.1 mg/dL (ref 8.6–10.2)
Chloride: 105 mmol/L (ref 96–106)
Creatinine, Ser: 1.28 mg/dL — ABNORMAL HIGH (ref 0.76–1.27)
GFR calc non Af Amer: 54 mL/min/{1.73_m2} — ABNORMAL LOW (ref >59)
GFR, EST AFRICAN AMERICAN: 62 mL/min/{1.73_m2} (ref >59)
GLOBULIN, TOTAL: 2.8 g/dL (ref 1.5–4.5)
Glucose: 117 mg/dL — ABNORMAL HIGH (ref 65–99)
POTASSIUM: 4.9 mmol/L (ref 3.5–5.2)
SODIUM: 142 mmol/L (ref 134–144)
TOTAL PROTEIN: 6.7 g/dL (ref 6.0–8.5)

## 2018-02-11 LAB — MICROALBUMIN / CREATININE URINE RATIO
Creatinine, Urine: 206.3 mg/dL
Microalb/Creat Ratio: 53.6 mg/g creat — ABNORMAL HIGH (ref 0.0–30.0)
Microalbumin, Urine: 110.6 ug/mL

## 2018-02-11 LAB — T4: T4 TOTAL: 6.1 ug/dL (ref 4.5–12.0)

## 2018-02-11 LAB — TSH: TSH: 1.56 u[IU]/mL (ref 0.450–4.500)

## 2018-02-11 LAB — HGB A1C W/O EAG: HEMOGLOBIN A1C: 7 % — AB (ref 4.8–5.6)

## 2018-03-12 DIAGNOSIS — M25562 Pain in left knee: Secondary | ICD-10-CM | POA: Diagnosis not present

## 2018-03-12 DIAGNOSIS — M1712 Unilateral primary osteoarthritis, left knee: Secondary | ICD-10-CM | POA: Diagnosis not present

## 2018-04-21 DIAGNOSIS — Z23 Encounter for immunization: Secondary | ICD-10-CM | POA: Diagnosis not present

## 2018-05-06 ENCOUNTER — Ambulatory Visit (INDEPENDENT_AMBULATORY_CARE_PROVIDER_SITE_OTHER): Payer: PPO | Admitting: "Endocrinology

## 2018-05-06 ENCOUNTER — Encounter: Payer: Self-pay | Admitting: "Endocrinology

## 2018-05-06 VITALS — BP 136/77 | HR 85 | Ht 69.5 in | Wt 261.0 lb

## 2018-05-06 DIAGNOSIS — Z794 Long term (current) use of insulin: Secondary | ICD-10-CM

## 2018-05-06 DIAGNOSIS — E1122 Type 2 diabetes mellitus with diabetic chronic kidney disease: Secondary | ICD-10-CM | POA: Diagnosis not present

## 2018-05-06 DIAGNOSIS — N183 Chronic kidney disease, stage 3 unspecified: Secondary | ICD-10-CM

## 2018-05-06 DIAGNOSIS — I10 Essential (primary) hypertension: Secondary | ICD-10-CM

## 2018-05-06 DIAGNOSIS — E782 Mixed hyperlipidemia: Secondary | ICD-10-CM | POA: Diagnosis not present

## 2018-05-06 DIAGNOSIS — E039 Hypothyroidism, unspecified: Secondary | ICD-10-CM | POA: Diagnosis not present

## 2018-05-06 NOTE — Progress Notes (Signed)
Endocrinology follow-up note   Subjective:    Patient ID: [name], male    DOB: May 13, 1941, PCP Sharilyn Sites, MD   Past Medical History:  Diagnosis Date  . Anemia   . Biliary calculi, common bile duct   . CRI (chronic renal insufficiency)   . DM (diabetes mellitus) ([place])    Type II  . Elevated liver enzymes   . Esophageal varices ([place]) 03/19/11   mrcp  . History of ERCP 03/21/2011   Extrahepatic biliary obstruction secondary to occluded biliary stent and colon no cholelithiasis, status post sphincterotomy, stent removal and replacement and stone extraction.  Marland Kitchen HTN (hypertension)   . Hypothyroidism   . NASH (nonalcoholic steatohepatitis) 2006   liver transplant  . Pancreatitis chronic 03/19/11   mrcp  . S/P colonoscopy with polypectomy 2004   Dr Gala Romney  . S/P endoscopy 03/2004   Hx esophgaeal varices, pyloric channel ulcer prior to transplant  . Squamous cell cancer of external ear    pinna   Past Surgical History:  Procedure Laterality Date  . BILE DUCT STENT PLACEMENT  03/21/11   Dr. Gala Romney  . CHOLECYSTECTOMY  2006  . COLONOSCOPY  08/12/2002   Dr. Gala Romney- somewhat diffusely friable rectum and colon, polyp at 30 cm.  . COLONOSCOPY N/A 09/08/2012   Procedure: COLONOSCOPY;  Surgeon: Daneil Dolin, MD;  Location: [facility];  Service: Endoscopy;  Laterality: N/A;  10:30  . ERCP  03/21/2011   Dr. Gala Romney- ERCP with removal of pediatric feeding tube, endoscopic sphinecterotomy with balloon dilation of ampulla followed by stone extraction and stent placement  . ESOPHAGOGASTRODUODENOSCOPY  03/20/2004   Dr. Nash Mantis 2 esophageal varices o/w normal esophageal mucosa, mild changes of snake skin in the gastric mucosa consistent with portal gastropathy, multiple antral erosions  and  a single pyloric channel ulcer benign appearance o/w normal gastric mucosa, normal D1 and S2  . KNEE ARTHROSCOPY WITH EXCISION PLICA Left 0/81/4481   Procedure: KNEE ARTHROSCOPY WITH EXCISION PLICA;   Surgeon: Carole Civil, MD;  Location: [facility];  Service: Orthopedics;  Laterality: Left;  . KNEE ARTHROSCOPY WITH MEDIAL MENISECTOMY Left 11/21/2012   Procedure: KNEE ARTHROSCOPY WITH MEDIAL AND LATERAL MENISECTOMY;  Surgeon: Carole Civil, MD;  Location: [facility];  Service: Orthopedics;  Laterality: Left;  . LIVER TRANSPLANT  2006   UVA-NASH cirrhosis  . MOUTH SURGERY  may 2012  . MRCP  03/19/11   biliary ductal dilatation- stones. esophageal varices  . SPHINCTEROTOMY  03/21/2011   Procedure: SPHINCTEROTOMY;  Surgeon: Daneil Dolin, MD;  Location: [facility];  Service: Gastroenterology;;   Social History   Socioeconomic History  . Marital status: Married    Spouse name: Not on file  . Number of children: 1  . Years of education: 58  . Highest education level: Not on file  Occupational History  . Occupation: RETIRED    Employer: RETIRED  Social Needs  . Financial resource strain: Not on file  . Food insecurity:    Worry: Not on file    Inability: Not on file  . Transportation needs:    Medical: Not on file    Non-medical: Not on file  Tobacco Use  . Smoking status: Never Smoker  . Smokeless tobacco: Never Used  Substance and Sexual Activity  . Alcohol use: No  . Drug use: No  . Sexual activity: Not on file  Lifestyle  . Physical activity:    Days per week: Not on file  Minutes per session: Not on file  . Stress: Not on file  Relationships  . Social connections:    Talks on phone: Not on file    Gets together: Not on file    Attends religious service: Not on file    Active member of club or organization: Not on file    Attends meetings of clubs or organizations: Not on file    Relationship status: Not on file  Other Topics Concern  . Not on file  Social History Narrative  . Not on file   Outpatient Encounter Medications as of 05/06/2018  Medication Sig  . aspirin 325 MG tablet Take 1 tablet (325 mg total) by mouth daily.  Marland Kitchen docusate sodium (COLACE) 100 MG  capsule Take 100 mg by mouth at bedtime.  Marland Kitchen glucose blood (ONE TOUCH ULTRA TEST) test strip TESTING FOUR TIMES DAILY.  Marland Kitchen insulin aspart (NOVOLOG FLEXPEN) 100 UNIT/ML FlexPen Inject 12-18 Units into the skin 3 (three) times daily with meals. Give per sliding scale three times a day  . Insulin Glargine (LANTUS SOLOSTAR) 100 UNIT/ML Solostar Pen Inject 34 Units into the skin daily at 10 pm.  . levothyroxine (SYNTHROID, LEVOTHROID) 137 MCG tablet Take 1 tablet (137 mcg total) by mouth daily before breakfast.  . lisinopril (PRINIVIL,ZESTRIL) 20 MG tablet Take 20 mg by mouth daily.   . methocarbamol (ROBAXIN) 500 MG tablet Take 500 mg by mouth every 6 (six) hours as needed for muscle spasms.  . Multiple Vitamins-Minerals (MULTIVITAMIN WITH MINERALS) tablet Take 1 tablet by mouth daily.    Marland Kitchen oxyCODONE-acetaminophen (PERCOCET/ROXICET) 5-325 MG tablet Take 1 tablet by mouth every 4 (four) hours as needed.  Marland Kitchen Specialty Vitamins Products (MAGNESIUM, AMINO ACID CHELATE,) 133 MG tablet Take 1 tablet by mouth 2 (two) times daily. Magnesium 133mg    . tacrolimus (PROGRAF) 1 MG capsule Take 1 mg by mouth 2 (two) times daily.   . [DISCONTINUED] Continuous Blood Gluc Sensor (FREESTYLE LIBRE SENSOR SYSTEM) MISC Use one sensor every 10 days.   No facility-administered encounter medications on file as of 05/06/2018.    ALLERGIES: Allergies  Allergen Reactions  . Contrast Media [Iodinated Diagnostic Agents] Rash   VACCINATION STATUS: Immunization History  Administered Date(s) Administered  . Influenza Split 02/13/2011, 04/29/2012, 04/20/2013, 05/09/2013  . Influenza, High Dose Seasonal PF 04/26/2015, 04/13/2017  . Influenza-Unspecified 04/08/2013  . Pneumococcal Conjugate-13 07/29/2014  . Pneumococcal Polysaccharide-23 03/17/2010, 04/16/2011    Diabetes  He presents for his follow-up diabetic visit. He has type 2 diabetes mellitus. Onset time: He was diagnosed at approximate age of 6 years. His disease  course has been improving. There are no hypoglycemic associated symptoms. Pertinent negatives for hypoglycemia include no confusion, headaches, pallor or seizures. There are no diabetic associated symptoms. Pertinent negatives for diabetes include no chest pain, no fatigue, no polydipsia, no polyphagia, no polyuria and no weakness. There are no hypoglycemic complications. Symptoms are improving. Diabetic complications include a CVA. Risk factors for coronary artery disease include diabetes mellitus, dyslipidemia, hypertension, male sex, obesity, sedentary lifestyle and tobacco exposure. Current diabetic treatment includes intensive insulin program. He is compliant with treatment most of the time. His weight is fluctuating minimally. He is following a generally unhealthy diet. He rarely participates in exercise. His home blood glucose trend is decreasing steadily. His breakfast blood glucose range is generally 140-180 mg/dl. His lunch blood glucose range is generally 140-180 mg/dl. His dinner blood glucose range is generally 140-180 mg/dl. His overall blood glucose range is 140-180  mg/dl. An ACE inhibitor/angiotensin II receptor blocker is being taken. Eye exam is current.  Hyperlipidemia  This is a chronic problem. The current episode started more than 1 year ago. The problem is controlled. Exacerbating diseases include diabetes, hypothyroidism and obesity. Pertinent negatives include no chest pain, myalgias or shortness of breath. He is currently on no antihyperlipidemic treatment. Risk factors for coronary artery disease include dyslipidemia, diabetes mellitus, hypertension, male sex, obesity and a sedentary lifestyle.  Hypertension  This is a chronic problem. The current episode started more than 1 year ago. The problem is uncontrolled. Pertinent negatives include no chest pain, headaches, neck pain, palpitations or shortness of breath. Risk factors for coronary artery disease include dyslipidemia, family  history, obesity, male gender and sedentary lifestyle. Past treatments include ACE inhibitors. Hypertensive end-organ damage includes CVA. He is status post liver transplant.. Identifiable causes of hypertension include a thyroid problem.  Thyroid Problem  Presents for follow-up visit. Patient reports no constipation, diarrhea, fatigue or palpitations. The symptoms have been improving. Past treatments include levothyroxine. The following procedures have not been performed: thyroidectomy. His past medical history is significant for diabetes and hyperlipidemia.     Review of Systems  Constitutional: Negative for chills, fatigue, fever and unexpected weight change.  HENT: Negative for dental problem, mouth sores and trouble swallowing.   Eyes: Negative for visual disturbance.  Respiratory: Negative for cough, choking, chest tightness, shortness of breath and wheezing.   Cardiovascular: Negative for chest pain, palpitations and leg swelling.  Gastrointestinal: Negative for abdominal distention, abdominal pain, constipation, diarrhea, nausea and vomiting.  Endocrine: Negative for polydipsia, polyphagia and polyuria.  Genitourinary: Negative for dysuria, flank pain, hematuria and urgency.  Musculoskeletal: Negative for back pain, gait problem, myalgias and neck pain.  Skin: Negative for pallor, rash and wound.  Neurological: Negative for seizures, syncope, weakness, numbness and headaches.  Psychiatric/Behavioral: Negative for confusion and dysphoric mood.    Objective:    BP 136/77   Pulse 85   Wt 261 lb (118.4 kg)   BMI 37.99 kg/m   Wt Readings from Last 3 Encounters:  05/06/18 261 lb (118.4 kg)  01/03/18 258 lb (117 kg)  09/05/17 260 lb (117.9 kg)    Physical Exam  Constitutional: He is oriented to person, place, and time. He appears well-developed. He is cooperative. No distress.  HENT:  Head: Normocephalic and atraumatic.  Eyes: EOM are normal.  Neck: Normal range of motion. Neck  supple. No tracheal deviation present. No thyromegaly present.  Cardiovascular: Normal rate, S1 normal and S2 normal. Exam reveals no gallop.  No murmur heard. Pulses:      Dorsalis pedis pulses are 1+ on the right side, and 1+ on the left side.       Posterior tibial pulses are 1+ on the right side, and 1+ on the left side.  Pulmonary/Chest: Effort normal. No respiratory distress. He has no wheezes.  Abdominal: He exhibits no distension. There is no tenderness. There is no guarding and no CVA tenderness.  Musculoskeletal: He exhibits no edema.       Right shoulder: He exhibits no swelling and no deformity.  Neurological: He is alert and oriented to person, place, and time. He has normal strength. No cranial nerve deficit or sensory deficit. Gait normal.  Skin: Skin is warm and dry. No rash noted. No cyanosis. Nails show no clubbing.  Psychiatric: He has a normal mood and affect. His speech is normal. Cognition and memory are normal.    Results  for orders placed or performed in visit on 02/10/18  Comprehensive metabolic panel  Result Value Ref Range   Glucose 117 (H) 65 - 99 mg/dL   BUN 21 8 - 27 mg/dL   Creatinine, Ser 1.28 (H) 0.76 - 1.27 mg/dL   GFR calc non Af Amer 54 (L) >59 mL/min/1.73   GFR calc Af Amer 62 >59 mL/min/1.73   BUN/Creatinine Ratio 16 10 - 24   Sodium 142 134 - 144 mmol/L   Potassium 4.9 3.5 - 5.2 mmol/L   Chloride 105 96 - 106 mmol/L   CO2 25 20 - 29 mmol/L   Calcium 9.1 8.6 - 10.2 mg/dL   Total Protein 6.7 6.0 - 8.5 g/dL   Albumin 3.9 3.5 - 4.8 g/dL   Globulin, Total 2.8 1.5 - 4.5 g/dL   Albumin/Globulin Ratio 1.4 1.2 - 2.2   Bilirubin Total 0.7 0.0 - 1.2 mg/dL   Alkaline Phosphatase 77 39 - 117 IU/L   AST 17 0 - 40 IU/L   ALT 14 0 - 44 IU/L  Microalbumin / creatinine urine ratio  Result Value Ref Range   Creatinine, Urine 206.3 Not Estab. mg/dL   Microalbumin, Urine 110.6 Not Estab. ug/mL   Microalb/Creat Ratio 53.6 (H) 0.0 - 30.0 mg/g creat  Hgb A1c  w/o eAG  Result Value Ref Range   Hgb A1c MFr Bld 7.0 (H) 4.8 - 5.6 %  TSH  Result Value Ref Range   TSH 1.560 0.450 - 4.500 uIU/mL  VITAMIN D 25 Hydroxy (Vit-D Deficiency, Fractures)  Result Value Ref Range   Vit D, 25-Hydroxy 32.9 30.0 - 100.0 ng/mL  T4  Result Value Ref Range   T4, Total 6.1 4.5 - 12.0 ug/dL   Diabetic Labs (most recent): Lab Results  Component Value Date   HGBA1C 7.0 (H) 02/10/2018   HGBA1C 7.7 (H) 11/11/2017   HGBA1C 7.0 (H) 08/12/2017   Lipid Panel     Component Value Date/Time   CHOL 131 08/12/2017   TRIG 113 08/12/2017   HDL 35 08/12/2017   CHOLHDL 4.3 04/13/2017 0547   VLDL 21 04/13/2017 0547   LDLCALC 73 08/12/2017     Assessment & Plan:   1. Uncontrolled type 2 diabetes mellitus with complication, with CKD GFR of 49  -Recently he had developed CVA requiring rehabilitation and physical therapy. He remains at a high risk for more acute and chronic complications of diabetes which include CAD, CVA, CKD, retinopathy, and neuropathy. These are all discussed in detail with the patient.  Patient came with improved A1c of 7% from 7.7% during his last visit.   - His most recent blood glucose readings are near target.  Glucose logs and insulin administration records pertaining to this visit,  to be scanned into patient's records.  Recent labs reviewed.   - I have re-counseled the patient on diet management   by adopting a carbohydrate restricted / protein rich  Diet.  -  Suggestion is made for him to avoid simple carbohydrates  from his diet including Cakes, Sweet Desserts / Pastries, Ice Cream, Soda (diet and regular), Sweet Tea, Candies, Chips, Cookies, Store Bought Juices, Alcohol in Excess of  1-2 drinks a day, Artificial Sweeteners, and "Sugar-free" Products. This will help patient to have stable blood glucose profile and potentially avoid unintended weight gain.   - Patient is advised to stick to a routine mealtimes to eat 3 meals  a day and  avoid unnecessary snacks ( to snack only to correct  hypoglycemia).  - I have approached patient with the following individualized plan to manage diabetes and patient agrees.  -He is advised to continue Lantus 34 units nightly,  continue NovoLog 12 units  3 times a day before meals plus correction, associated with strict monitoring of blood glucose 4 times a day - before meals and at bedtime and as needed. - He would have benefited  from continuous glucose monitoring, however he decided not to get it because he cannot afford the monthly co-pay.    - I advised him to contact me for hypoglycemia or hyperglycemia above 200. - Adjustments for hypo-and hyperglycemia is given in written instructions to the patient.  -He is not  a candidate for metformin, SGLT2 inhibitors, and incretin in therapy due to history of liver transplant.  - Patient specific target  for A1c; LDL, HDL, Triglycerides, and  Waist Circumference were discussed in detail.  2) BP/HTN: His blood pressure is controlled to target.   He is advised to continue his current blood pressure medications including lisinopril 20 mg p.o. daily.   3) Lipids/HPL: His lipid panel is controlled with LDL at 73.  He is not on statins, as a result of his history of liver transplant.   4)  Weight/Diet:  exercise, and carbohydrates information provided.  5) Primary hypothyroidism:  His recent thyroid function tests are consistent with appropriate replacement.  -I advised him to continue levothyroxine 137 mcg p.o. daily before breakfast.     - We discussed about correct intake of levothyroxine, at fasting, with water, separated by at least 30 minutes from breakfast, and separated by more than 4 hours from calcium, iron, multivitamins, acid reflux medications (PPIs). -Patient is made aware of the fact that thyroid hormone replacement is needed for life, dose to be adjusted by periodic monitoring of thyroid function tests.  5) Chronic Care/Health  Maintenance:  -Patient is on ACEI/ARB  medications and encouraged to continue to follow up with Ophthalmology, Podiatrist at least yearly or according to recommendations, and advised to  stay away from smoking. I have recommended yearly flu vaccine and pneumonia vaccination at least every 5 years; and  sleep for at least 7 hours a day.  - I advised patient to maintain close follow up with Sharilyn Sites, MD for primary care needs.  - Time spent with the patient: 25 min, of which >50% was spent in reviewing his blood glucose logs , discussing his hypo- and hyper-glycemic episodes, reviewing his current and  previous labs and insulin doses and developing a plan to avoid hypo- and hyper-glycemia. Please refer to Patient Instructions for Blood Glucose Monitoring and Insulin/Medications Dosing Guide"  in media tab for additional information. Danae Orleans participated in the discussions, expressed understanding, and voiced agreement with the above plans.  All questions were answered to his satisfaction. he is encouraged to contact clinic should he have any questions or concerns prior to his return visit.    Follow up plan: -Return in about 6 months (around 11/05/2018) for Follow up with Pre-visit Labs, Meter, and Logs.  Glade Lloyd, MD Phone: [phone]  Fax: [phone]  -  This note was partially dictated with voice recognition software. Similar sounding words can be transcribed inadequately or may not  be corrected upon review.  05/06/2018, 10:05 AM

## 2018-05-06 NOTE — Patient Instructions (Signed)

## 2018-05-12 ENCOUNTER — Encounter: Payer: Self-pay | Admitting: "Endocrinology

## 2018-05-12 DIAGNOSIS — Z944 Liver transplant status: Secondary | ICD-10-CM | POA: Diagnosis not present

## 2018-06-11 DIAGNOSIS — M25562 Pain in left knee: Secondary | ICD-10-CM | POA: Diagnosis not present

## 2018-06-11 DIAGNOSIS — M1712 Unilateral primary osteoarthritis, left knee: Secondary | ICD-10-CM | POA: Diagnosis not present

## 2018-06-13 DIAGNOSIS — Z681 Body mass index (BMI) 19 or less, adult: Secondary | ICD-10-CM | POA: Diagnosis not present

## 2018-06-13 DIAGNOSIS — R220 Localized swelling, mass and lump, head: Secondary | ICD-10-CM | POA: Diagnosis not present

## 2018-06-13 DIAGNOSIS — Z1389 Encounter for screening for other disorder: Secondary | ICD-10-CM | POA: Diagnosis not present

## 2018-07-30 DIAGNOSIS — E7849 Other hyperlipidemia: Secondary | ICD-10-CM | POA: Diagnosis not present

## 2018-07-30 DIAGNOSIS — Z1389 Encounter for screening for other disorder: Secondary | ICD-10-CM | POA: Diagnosis not present

## 2018-07-30 DIAGNOSIS — E1129 Type 2 diabetes mellitus with other diabetic kidney complication: Secondary | ICD-10-CM | POA: Diagnosis not present

## 2018-07-30 DIAGNOSIS — E782 Mixed hyperlipidemia: Secondary | ICD-10-CM | POA: Diagnosis not present

## 2018-07-30 DIAGNOSIS — Z6839 Body mass index (BMI) 39.0-39.9, adult: Secondary | ICD-10-CM | POA: Diagnosis not present

## 2018-07-30 DIAGNOSIS — I10 Essential (primary) hypertension: Secondary | ICD-10-CM | POA: Diagnosis not present

## 2018-07-30 DIAGNOSIS — Z944 Liver transplant status: Secondary | ICD-10-CM | POA: Diagnosis not present

## 2018-07-30 DIAGNOSIS — E039 Hypothyroidism, unspecified: Secondary | ICD-10-CM | POA: Diagnosis not present

## 2018-07-30 DIAGNOSIS — I639 Cerebral infarction, unspecified: Secondary | ICD-10-CM | POA: Diagnosis not present

## 2018-07-30 DIAGNOSIS — Z0001 Encounter for general adult medical examination with abnormal findings: Secondary | ICD-10-CM | POA: Diagnosis not present

## 2018-07-31 DIAGNOSIS — I10 Essential (primary) hypertension: Secondary | ICD-10-CM | POA: Diagnosis not present

## 2018-07-31 DIAGNOSIS — Z79899 Other long term (current) drug therapy: Secondary | ICD-10-CM | POA: Diagnosis not present

## 2018-07-31 DIAGNOSIS — R7989 Other specified abnormal findings of blood chemistry: Secondary | ICD-10-CM | POA: Diagnosis not present

## 2018-07-31 DIAGNOSIS — Z48298 Encounter for aftercare following other organ transplant: Secondary | ICD-10-CM | POA: Diagnosis not present

## 2018-07-31 DIAGNOSIS — Z944 Liver transplant status: Secondary | ICD-10-CM | POA: Diagnosis not present

## 2018-07-31 DIAGNOSIS — E119 Type 2 diabetes mellitus without complications: Secondary | ICD-10-CM | POA: Diagnosis not present

## 2018-08-25 ENCOUNTER — Other Ambulatory Visit: Payer: Self-pay | Admitting: "Endocrinology

## 2018-08-25 DIAGNOSIS — E039 Hypothyroidism, unspecified: Secondary | ICD-10-CM | POA: Diagnosis not present

## 2018-08-25 DIAGNOSIS — Z944 Liver transplant status: Secondary | ICD-10-CM | POA: Diagnosis not present

## 2018-08-25 DIAGNOSIS — N183 Chronic kidney disease, stage 3 (moderate): Secondary | ICD-10-CM | POA: Diagnosis not present

## 2018-08-25 DIAGNOSIS — E1129 Type 2 diabetes mellitus with other diabetic kidney complication: Secondary | ICD-10-CM | POA: Diagnosis not present

## 2018-08-25 DIAGNOSIS — Z794 Long term (current) use of insulin: Secondary | ICD-10-CM | POA: Diagnosis not present

## 2018-08-26 LAB — HGB A1C W/O EAG: Hgb A1c MFr Bld: 7.2 % — ABNORMAL HIGH (ref 4.8–5.6)

## 2018-08-26 LAB — COMPREHENSIVE METABOLIC PANEL
ALT: 13 IU/L (ref 0–44)
AST: 21 IU/L (ref 0–40)
Albumin/Globulin Ratio: 1.3 (ref 1.2–2.2)
Albumin: 3.9 g/dL (ref 3.7–4.7)
Alkaline Phosphatase: 81 IU/L (ref 39–117)
BUN/Creatinine Ratio: 19 (ref 10–24)
BUN: 23 mg/dL (ref 8–27)
Bilirubin Total: 0.7 mg/dL (ref 0.0–1.2)
CALCIUM: 9.2 mg/dL (ref 8.6–10.2)
CO2: 23 mmol/L (ref 20–29)
Chloride: 103 mmol/L (ref 96–106)
Creatinine, Ser: 1.19 mg/dL (ref 0.76–1.27)
GFR calc Af Amer: 68 mL/min/{1.73_m2} (ref >59)
GFR calc non Af Amer: 59 mL/min/{1.73_m2} — ABNORMAL LOW (ref >59)
Globulin, Total: 2.9 g/dL (ref 1.5–4.5)
Glucose: 138 mg/dL — ABNORMAL HIGH (ref 65–99)
Potassium: 4.6 mmol/L (ref 3.5–5.2)
Sodium: 142 mmol/L (ref 134–144)
TOTAL PROTEIN: 6.8 g/dL (ref 6.0–8.5)

## 2018-08-26 LAB — TSH: TSH: 2.22 u[IU]/mL (ref 0.450–4.500)

## 2018-08-26 LAB — T4, FREE: Free T4: 1.25 ng/dL (ref 0.82–1.77)

## 2018-09-15 DIAGNOSIS — M25562 Pain in left knee: Secondary | ICD-10-CM | POA: Diagnosis not present

## 2018-09-15 DIAGNOSIS — M1712 Unilateral primary osteoarthritis, left knee: Secondary | ICD-10-CM | POA: Diagnosis not present

## 2018-09-22 DIAGNOSIS — Z944 Liver transplant status: Secondary | ICD-10-CM | POA: Diagnosis not present

## 2018-10-03 ENCOUNTER — Other Ambulatory Visit: Payer: Self-pay | Admitting: "Endocrinology

## 2018-10-20 DIAGNOSIS — Z944 Liver transplant status: Secondary | ICD-10-CM | POA: Diagnosis not present

## 2018-11-05 ENCOUNTER — Encounter: Payer: Self-pay | Admitting: "Endocrinology

## 2018-11-05 ENCOUNTER — Ambulatory Visit (INDEPENDENT_AMBULATORY_CARE_PROVIDER_SITE_OTHER): Payer: PPO | Admitting: "Endocrinology

## 2018-11-05 ENCOUNTER — Other Ambulatory Visit: Payer: Self-pay

## 2018-11-05 DIAGNOSIS — Z794 Long term (current) use of insulin: Secondary | ICD-10-CM

## 2018-11-05 DIAGNOSIS — E1122 Type 2 diabetes mellitus with diabetic chronic kidney disease: Secondary | ICD-10-CM | POA: Diagnosis not present

## 2018-11-05 DIAGNOSIS — E782 Mixed hyperlipidemia: Secondary | ICD-10-CM

## 2018-11-05 DIAGNOSIS — N183 Chronic kidney disease, stage 3 unspecified: Secondary | ICD-10-CM

## 2018-11-05 DIAGNOSIS — I10 Essential (primary) hypertension: Secondary | ICD-10-CM

## 2018-11-05 DIAGNOSIS — E039 Hypothyroidism, unspecified: Secondary | ICD-10-CM

## 2018-11-05 MED ORDER — LEVOTHYROXINE SODIUM 137 MCG PO TABS: 137.0000 ug | ORAL_TABLET | Freq: Every day | ORAL | 6 refills | Status: DC

## 2018-11-05 MED ORDER — INSULIN GLARGINE 100 UNIT/ML SOLOSTAR PEN: 34.0000 [IU] | PEN_INJECTOR | Freq: Every day | SUBCUTANEOUS | 1 refills | Status: DC

## 2018-11-05 MED ORDER — INSULIN ASPART 100 UNIT/ML FLEXPEN: 12.0000 [IU] | PEN_INJECTOR | Freq: Three times a day (TID) | SUBCUTANEOUS | 1 refills | Status: DC

## 2018-11-05 NOTE — Progress Notes (Signed)
11/05/2018                                                     Endocrinology Telehealth Visit Follow up Note -During COVID -19 Pandemic  This visit type was conducted due to national recommendations for restrictions regarding the COVID-19 Pandemic  in an effort to limit this patient's exposure and mitigate transmission of the corona virus.  Due to his co-morbid illnesses, [name] is at  moderate to high risk for complications without adequate follow up.  This format is felt to be most appropriate for him at this time.  I connected with this patient on 11/05/2018   by telephone and verified that I am speaking with the correct person using two identifiers. [name], [birth year]. he has verbally consented to this visit. All issues noted in this document were discussed and addressed. The format was not optimal for physical exam.     Subjective:    Patient ID: [name], male    DOB: [birth year], PCP Sharilyn Sites, MD   Past Medical History:  Diagnosis Date  . Anemia   . Biliary calculi, common bile duct   . CRI (chronic renal insufficiency)   . DM (diabetes mellitus) ([place])    Type II  . Elevated liver enzymes   . Esophageal varices ([place]) 03/19/11   mrcp  . History of ERCP 03/21/2011   Extrahepatic biliary obstruction secondary to occluded biliary stent and colon no cholelithiasis, status post sphincterotomy, stent removal and replacement and stone extraction.  Marland Kitchen HTN (hypertension)   . Hypothyroidism   . NASH (nonalcoholic steatohepatitis) 2006   liver transplant  . Pancreatitis chronic 03/19/11   mrcp  . S/P colonoscopy with polypectomy 2004   Dr Gala Romney  . S/P endoscopy 03/2004   Hx esophgaeal varices, pyloric channel ulcer prior to transplant  . Squamous cell cancer of external ear    pinna   Past Surgical History:  Procedure Laterality Date  . BILE DUCT STENT PLACEMENT  03/21/11   Dr. Gala Romney  . CHOLECYSTECTOMY  2006  . COLONOSCOPY  08/12/2002   Dr. Gala Romney- somewhat  diffusely friable rectum and colon, polyp at 30 cm.  . COLONOSCOPY N/A 09/08/2012   Procedure: COLONOSCOPY;  Surgeon: Daneil Dolin, MD;  Location: [facility];  Service: Endoscopy;  Laterality: N/A;  10:30  . ERCP  03/21/2011   Dr. Gala Romney- ERCP with removal of pediatric feeding tube, endoscopic sphinecterotomy with balloon dilation of ampulla followed by stone extraction and stent placement  . ESOPHAGOGASTRODUODENOSCOPY  03/20/2004   Dr. Nash Mantis 2 esophageal varices o/w normal esophageal mucosa, mild changes of snake skin in the gastric mucosa consistent with portal gastropathy, multiple antral erosions  and  a single pyloric channel ulcer benign appearance o/w normal gastric mucosa, normal D1 and S2  . KNEE ARTHROSCOPY WITH EXCISION PLICA Left 09/06/6008   Procedure: KNEE ARTHROSCOPY WITH EXCISION PLICA;  Surgeon: Carole Civil, MD;  Location: [facility];  Service: Orthopedics;  Laterality: Left;  . KNEE ARTHROSCOPY WITH MEDIAL MENISECTOMY Left 11/21/2012   Procedure: KNEE ARTHROSCOPY WITH MEDIAL AND LATERAL MENISECTOMY;  Surgeon: Carole Civil, MD;  Location: [facility];  Service: Orthopedics;  Laterality: Left;  . LIVER TRANSPLANT  2006   UVA-NASH cirrhosis  . MOUTH SURGERY  may 2012  . MRCP  03/19/11   biliary ductal dilatation- stones. esophageal varices  . SPHINCTEROTOMY  03/21/2011   Procedure: SPHINCTEROTOMY;  Surgeon: Daneil Dolin, MD;  Location: [facility];  Service: Gastroenterology;;   Social History   Socioeconomic History  . Marital status: Married    Spouse name: Not on file  . Number of children: 1  . Years of education: 12  . Highest education level: Not on file  Occupational History  . Occupation: RETIRED    Employer: RETIRED  Social Needs  . Financial resource strain: Not on file  . Food insecurity:    Worry: Not on file    Inability: Not on file  . Transportation needs:    Medical: Not on file    Non-medical: Not on file  Tobacco Use  . Smoking status: Never  Smoker  . Smokeless tobacco: Never Used  Substance and Sexual Activity  . Alcohol use: No  . Drug use: No  . Sexual activity: Not on file  Lifestyle  . Physical activity:    Days per week: Not on file    Minutes per session: Not on file  . Stress: Not on file  Relationships  . Social connections:    Talks on phone: Not on file    Gets together: Not on file    Attends religious service: Not on file    Active member of club or organization: Not on file    Attends meetings of clubs or organizations: Not on file    Relationship status: Not on file  Other Topics Concern  . Not on file  Social History Narrative  . Not on file   Outpatient Encounter Medications as of 11/05/2018  Medication Sig  . aspirin 325 MG tablet Take 1 tablet (325 mg total) by mouth daily.  Marland Kitchen docusate sodium (COLACE) 100 MG capsule Take 100 mg by mouth at bedtime.  Marland Kitchen glucose blood (ONE TOUCH ULTRA TEST) test strip TESTING FOUR TIMES DAILY.  Marland Kitchen insulin aspart (NOVOLOG FLEXPEN) 100 UNIT/ML FlexPen Inject 12-18 Units into the skin 3 (three) times daily with meals. Give per sliding scale three times a day  . Insulin Glargine (LANTUS SOLOSTAR) 100 UNIT/ML Solostar Pen Inject 34 Units into the skin daily at 10 pm.  . levothyroxine (SYNTHROID) 137 MCG tablet Take 1 tablet (137 mcg total) by mouth daily before breakfast.  . lisinopril (PRINIVIL,ZESTRIL) 20 MG tablet Take 20 mg by mouth daily.   . methocarbamol (ROBAXIN) 500 MG tablet Take 500 mg by mouth every 6 (six) hours as needed for muscle spasms.  . Multiple Vitamins-Minerals (MULTIVITAMIN WITH MINERALS) tablet Take 1 tablet by mouth daily.    Marland Kitchen oxyCODONE-acetaminophen (PERCOCET/ROXICET) 5-325 MG tablet Take 1 tablet by mouth every 4 (four) hours as needed.  Marland Kitchen Specialty Vitamins Products (MAGNESIUM, AMINO ACID CHELATE,) 133 MG tablet Take 1 tablet by mouth 2 (two) times daily. Magnesium 133mg    . tacrolimus (PROGRAF) 1 MG capsule Take 1 mg by mouth 2 (two) times  daily.   . [DISCONTINUED] insulin aspart (NOVOLOG FLEXPEN) 100 UNIT/ML FlexPen Inject 12-18 Units into the skin 3 (three) times daily with meals. Give per sliding scale three times a day  . [DISCONTINUED] Insulin Glargine (LANTUS SOLOSTAR) 100 UNIT/ML Solostar Pen Inject 34 Units into the skin daily at 10 pm.  . [DISCONTINUED] levothyroxine (SYNTHROID, LEVOTHROID) 137 MCG tablet Take 1 tablet (137 mcg total) by mouth daily before breakfast.   No facility-administered encounter medications on file as of 11/05/2018.    ALLERGIES:  Allergies  Allergen Reactions  . Contrast Media [Iodinated Diagnostic Agents] Rash   VACCINATION STATUS: Immunization History  Administered Date(s) Administered  . Influenza Split 02/13/2011, 04/29/2012, 04/20/2013, 05/09/2013  . Influenza, High Dose Seasonal PF 04/26/2015, 04/13/2017  . Influenza-Unspecified 04/08/2013  . Pneumococcal Conjugate-13 07/29/2014  . Pneumococcal Polysaccharide-23 03/17/2010, 04/16/2011    Diabetes  He presents for his follow-up diabetic visit. He has type 2 diabetes mellitus. Onset time: He was diagnosed at approximate age of 14 years. His disease course has been improving. There are no hypoglycemic associated symptoms. Pertinent negatives for hypoglycemia include no confusion, headaches, pallor or seizures. There are no diabetic associated symptoms. Pertinent negatives for diabetes include no chest pain, no fatigue, no polydipsia, no polyphagia, no polyuria and no weakness. There are no hypoglycemic complications. Symptoms are improving. Diabetic complications include a CVA. Risk factors for coronary artery disease include diabetes mellitus, dyslipidemia, hypertension, male sex, obesity, sedentary lifestyle and tobacco exposure. Current diabetic treatment includes intensive insulin program. He is compliant with treatment most of the time. His weight is stable. He is following a generally unhealthy diet. He rarely participates in exercise.  His home blood glucose trend is decreasing steadily. His breakfast blood glucose range is generally 140-180 mg/dl. His lunch blood glucose range is generally 140-180 mg/dl. His dinner blood glucose range is generally 140-180 mg/dl. His bedtime blood glucose range is generally 140-180 mg/dl. His overall blood glucose range is 140-180 mg/dl. An ACE inhibitor/angiotensin II receptor blocker is being taken. Eye exam is current.  Hyperlipidemia  This is a chronic problem. The current episode started more than 1 year ago. The problem is controlled. Exacerbating diseases include diabetes, hypothyroidism and obesity. Pertinent negatives include no chest pain, myalgias or shortness of breath. He is currently on no antihyperlipidemic treatment. Risk factors for coronary artery disease include dyslipidemia, diabetes mellitus, hypertension, male sex, obesity and a sedentary lifestyle.  Hypertension  This is a chronic problem. The current episode started more than 1 year ago. The problem is uncontrolled. Pertinent negatives include no chest pain, headaches, neck pain, palpitations or shortness of breath. Risk factors for coronary artery disease include dyslipidemia, family history, obesity, male gender and sedentary lifestyle. Past treatments include ACE inhibitors. Hypertensive end-organ damage includes CVA. He is status post liver transplant.. Identifiable causes of hypertension include a thyroid problem.  Thyroid Problem  Presents for follow-up visit. Patient reports no constipation, diarrhea, fatigue or palpitations. The symptoms have been improving. Past treatments include levothyroxine. The following procedures have not been performed: thyroidectomy. His past medical history is significant for diabetes and hyperlipidemia.   Review of systems : Limited as above.   Objective:    There were no vitals taken for this visit.  Wt Readings from Last 3 Encounters:  05/06/18 261 lb (118.4 kg)  01/03/18 258 lb (117  kg)  09/05/17 260 lb (117.9 kg)     Results for orders placed or performed in visit on 08/25/18  Comprehensive metabolic panel  Result Value Ref Range   Glucose 138 (H) 65 - 99 mg/dL   BUN 23 8 - 27 mg/dL   Creatinine, Ser 1.19 0.76 - 1.27 mg/dL   GFR calc non Af Amer 59 (L) >59 mL/min/1.73   GFR calc Af Amer 68 >59 mL/min/1.73   BUN/Creatinine Ratio 19 10 - 24   Sodium 142 134 - 144 mmol/L   Potassium 4.6 3.5 - 5.2 mmol/L   Chloride 103 96 - 106 mmol/L   CO2 23 20 - 29  mmol/L   Calcium 9.2 8.6 - 10.2 mg/dL   Total Protein 6.8 6.0 - 8.5 g/dL   Albumin 3.9 3.7 - 4.7 g/dL   Globulin, Total 2.9 1.5 - 4.5 g/dL   Albumin/Globulin Ratio 1.3 1.2 - 2.2   Bilirubin Total 0.7 0.0 - 1.2 mg/dL   Alkaline Phosphatase 81 39 - 117 IU/L   AST 21 0 - 40 IU/L   ALT 13 0 - 44 IU/L  Hgb A1c w/o eAG  Result Value Ref Range   Hgb A1c MFr Bld 7.2 (H) 4.8 - 5.6 %  T4, free  Result Value Ref Range   Free T4 1.25 0.82 - 1.77 ng/dL  TSH  Result Value Ref Range   TSH 2.220 0.450 - 4.500 uIU/mL   Diabetic Labs (most recent): Lab Results  Component Value Date   HGBA1C 7.2 (H) 08/25/2018   HGBA1C 7.0 (H) 02/10/2018   HGBA1C 7.7 (H) 11/11/2017   Lipid Panel     Component Value Date/Time   CHOL 131 08/12/2017   TRIG 113 08/12/2017   HDL 35 08/12/2017   CHOLHDL 4.3 04/13/2017 0547   VLDL 21 04/13/2017 0547   LDLCALC 73 08/12/2017     Assessment & Plan:   1. Uncontrolled type 2 diabetes mellitus with complication, with CKD GFR of 49  -Recently he had developed CVA requiring rehabilitation and physical therapy. He remains at a high risk for more acute and chronic complications of diabetes which include CAD, CVA, CKD, retinopathy, and neuropathy. These are all discussed in detail with the patient.  His previsit labs show A1c of 7.2% considered stable control for him.      - His most recent blood glucose readings are near target.  Glucose logs and insulin administration records pertaining  to this visit,  to be scanned into patient's records.  Recent labs reviewed.   - I have re-counseled the patient on diet management   by adopting a carbohydrate restricted / protein rich  Diet.  - Patient admits there is a room for improvement in his diet and drink choices. -  Suggestion is made for him to avoid simple carbohydrates  from his diet including Cakes, Sweet Desserts / Pastries, Ice Cream, Soda (diet and regular), Sweet Tea, Candies, Chips, Cookies, Store Bought Juices, Alcohol in Excess of  1-2 drinks a day, Artificial Sweeteners, and "Sugar-free" Products. This will help patient to have stable blood glucose profile and potentially avoid unintended weight gain.    - Patient is advised to stick to a routine mealtimes to eat 3 meals  a day and avoid unnecessary snacks ( to snack only to correct hypoglycemia).  - I have approached patient with the following individualized plan to manage diabetes and patient agrees.  -He is advised to continue Lantus 34  units nightly,  continue NovoLog 12 units  3 times a day before meals plus correction, associated with strict monitoring of blood glucose 4 times a day - before meals and at bedtime and as needed. - He would have benefited  from continuous glucose monitoring, however he decided not to get it because he cannot afford the monthly co-pay.    - I advised him to contact me for hypoglycemia or hyperglycemia above 200. - Adjustments for hypo-and hyperglycemia is given in written instructions to the patient.  -He is not  a candidate for metformin, SGLT2 inhibitors, and incretin in therapy due to history of liver transplant.    2) BP/HTN:he is advised to home monitor  blood pressure and report if > 140/90 on 2 separate readings.   He is advised to continue his current blood pressure medications including lisinopril 20 mg p.o. daily.   3) Lipids/HPL: His lipid panel is controlled with LDL at 73.  He is not on statins, as a result of his  history of liver transplant.   4)  Weight/Diet:  exercise, and carbohydrates information provided.  5) Primary hypothyroidism:  His recent thyroid function tests are consistent with appropriate replacement.  -I advised him to continue levothyroxine 137 mcg p.o. daily before breakfast.     - We discussed about the correct intake of his thyroid hormone, on empty stomach at fasting, with water, separated by at least 30 minutes from breakfast and other medications,  and separated by more than 4 hours from calcium, iron, multivitamins, acid reflux medications (PPIs). -Patient is made aware of the fact that thyroid hormone replacement is needed for life, dose to be adjusted by periodic monitoring of thyroid function tests.   5) Chronic Care/Health Maintenance:  -Patient is on ACEI/ARB  medications and encouraged to continue to follow up with Ophthalmology, Podiatrist at least yearly or according to recommendations, and advised to  stay away from smoking. I have recommended yearly flu vaccine and pneumonia vaccination at least every 5 years; and  sleep for at least 7 hours a day.  - I advised patient to maintain close follow up with Sharilyn Sites, MD for primary care needs.   - Patient Care Time Today:  25 min, of which >50% was spent in reviewing his  current and  previous labs/studies, previous treatments, and medications doses and developing a plan for long-term care based on the latest recommendations for standards of care.  [name] participated in the discussions, expressed understanding, and voiced agreement with the above plans.  All questions were answered to his satisfaction. he is encouraged to contact clinic should he have any questions or concerns prior to his return visit. follow up plan: -Return in about 4 months (around 03/07/2019) for Follow up with Pre-visit Labs, Meter, and Logs.  Glade Lloyd, MD Phone: [phone]  Fax: [phone]  -  This note was partially dictated  with voice recognition software. Similar sounding words can be transcribed inadequately or may not  be corrected upon review.  11/05/2018, 2:26 PM

## 2018-11-17 DIAGNOSIS — Z944 Liver transplant status: Secondary | ICD-10-CM | POA: Diagnosis not present

## 2019-02-09 ENCOUNTER — Other Ambulatory Visit: Payer: Self-pay | Admitting: "Endocrinology

## 2019-02-09 DIAGNOSIS — Z6839 Body mass index (BMI) 39.0-39.9, adult: Secondary | ICD-10-CM | POA: Diagnosis not present

## 2019-02-09 DIAGNOSIS — E039 Hypothyroidism, unspecified: Secondary | ICD-10-CM | POA: Diagnosis not present

## 2019-02-09 DIAGNOSIS — Z794 Long term (current) use of insulin: Secondary | ICD-10-CM | POA: Diagnosis not present

## 2019-02-09 DIAGNOSIS — E1122 Type 2 diabetes mellitus with diabetic chronic kidney disease: Secondary | ICD-10-CM | POA: Diagnosis not present

## 2019-02-09 DIAGNOSIS — N183 Chronic kidney disease, stage 3 (moderate): Secondary | ICD-10-CM | POA: Diagnosis not present

## 2019-02-09 DIAGNOSIS — Z1389 Encounter for screening for other disorder: Secondary | ICD-10-CM | POA: Diagnosis not present

## 2019-02-09 DIAGNOSIS — Z944 Liver transplant status: Secondary | ICD-10-CM | POA: Diagnosis not present

## 2019-02-09 DIAGNOSIS — R040 Epistaxis: Secondary | ICD-10-CM | POA: Diagnosis not present

## 2019-02-10 LAB — COMPREHENSIVE METABOLIC PANEL
ALT: 22 IU/L (ref 0–44)
AST: 27 IU/L (ref 0–40)
Albumin/Globulin Ratio: 1.3 (ref 1.2–2.2)
Albumin: 3.9 g/dL (ref 3.7–4.7)
Alkaline Phosphatase: 68 IU/L (ref 39–117)
BUN/Creatinine Ratio: 19 (ref 10–24)
BUN: 23 mg/dL (ref 8–27)
Bilirubin Total: 0.7 mg/dL (ref 0.0–1.2)
CO2: 23 mmol/L (ref 20–29)
Calcium: 8.9 mg/dL (ref 8.6–10.2)
Chloride: 105 mmol/L (ref 96–106)
Creatinine, Ser: 1.21 mg/dL (ref 0.76–1.27)
GFR calc Af Amer: 66 mL/min/{1.73_m2} (ref >59)
GFR calc non Af Amer: 57 mL/min/{1.73_m2} — ABNORMAL LOW (ref >59)
Globulin, Total: 3 g/dL (ref 1.5–4.5)
Glucose: 121 mg/dL — ABNORMAL HIGH (ref 65–99)
Potassium: 4.7 mmol/L (ref 3.5–5.2)
Sodium: 139 mmol/L (ref 134–144)
Total Protein: 6.9 g/dL (ref 6.0–8.5)

## 2019-02-10 LAB — HGB A1C W/O EAG: Hgb A1c MFr Bld: 7.4 % — ABNORMAL HIGH (ref 4.8–5.6)

## 2019-02-10 LAB — T4, FREE: Free T4: 1.18 ng/dL (ref 0.82–1.77)

## 2019-02-10 LAB — TSH: TSH: 3.03 u[IU]/mL (ref 0.450–4.500)

## 2019-02-13 IMAGING — MR MR HEAD W/O CM
9 of 11 series · 35 of 48 positions shown · non-contrast
Comparison: Head CT from earlier today

CLINICAL DATA: Ataxia, stroke suspected.

EXAM:
MRI HEAD WITHOUT CONTRAST
MRA HEAD WITHOUT CONTRAST
TECHNIQUE: Multiplanar, multiecho pulse sequences of the brain and surrounding
structures were obtained without intravenous contrast. Angiographic
images of the head were obtained using MRA technique without
contrast.

[Series 2: DWI · axial · 3.0mm · 0.73mm/px · z∈[-62,+97]mm · 5 of 54 slices shown (1 of 4)]
[im 1/54]
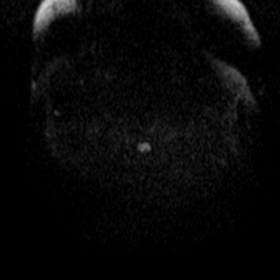
[im 14/54]
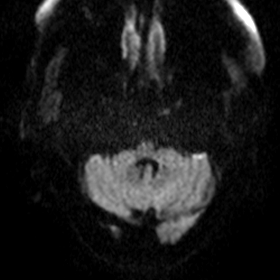
[im 27/54]
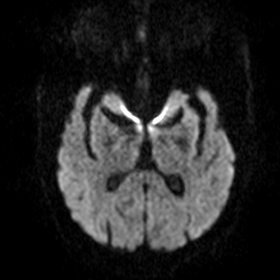
[im 40/54]
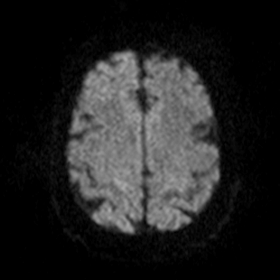
[im 54/54]
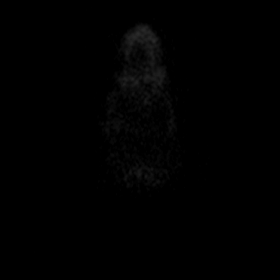

[Series 3: DWI · axial · 3.0mm · 0.77mm/px · z∈[-65,+97]mm · 5 of 55 slices shown (2 of 4)]
[im 1/55]
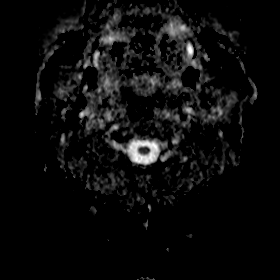
[im 14/55]
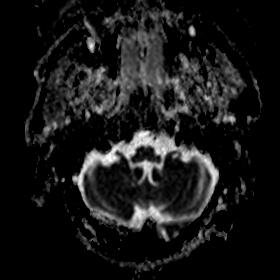
[im 28/55]
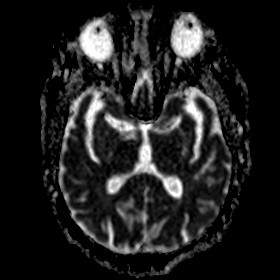
[im 41/55]
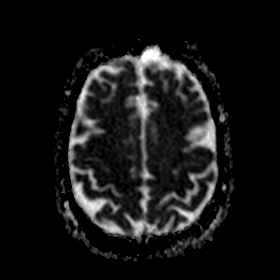
[im 55/55]
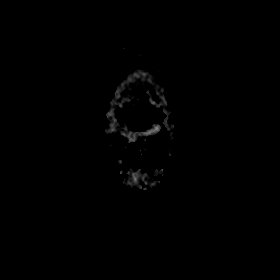

[Series 4: DWI · coronal · 5.0mm · 0.45mm/px · 3 of 34 slices shown (3 of 4)]
[im 1/34]
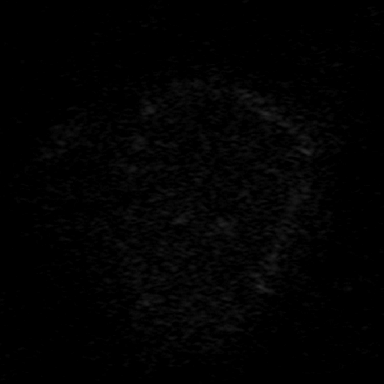
[im 17/34]
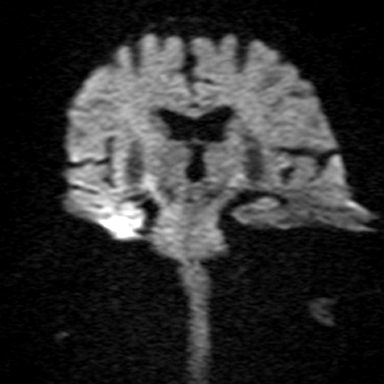
[im 34/34]
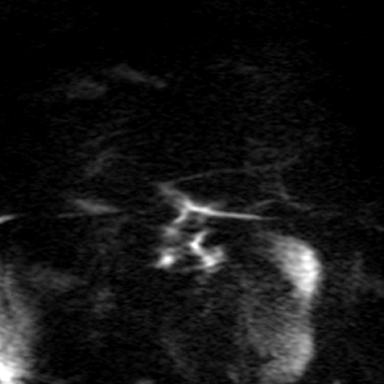

[Series 5: DWI · coronal · 5.0mm · 0.50mm/px · 3 of 34 slices shown (4 of 4)]
[im 1/34]
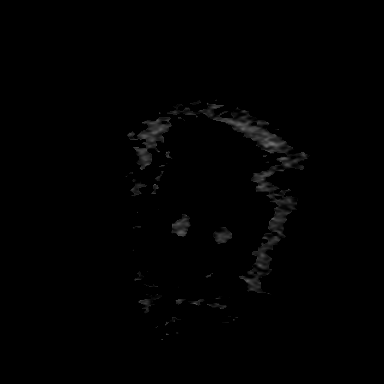
[im 17/34]
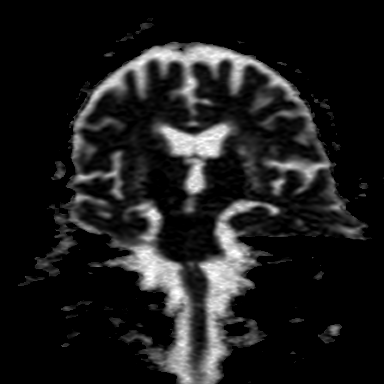
[im 34/34]
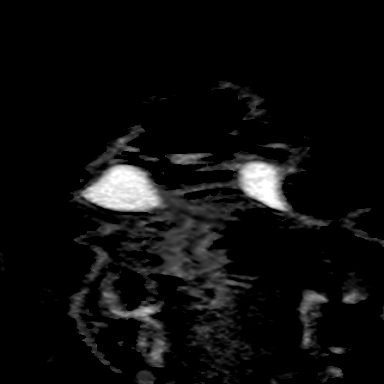

[Series 6: MRA · axial · 0.8mm · 0.38mm/px · z∈[-50,-23]mm · 3 of 131 slices shown]
[im 1/131]
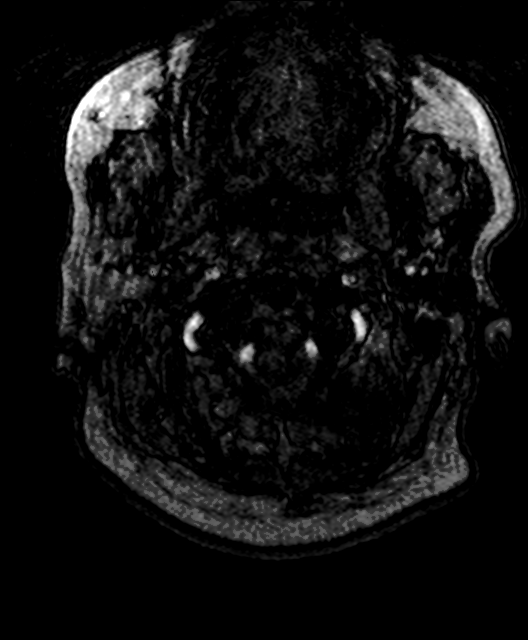
[im 24/131]
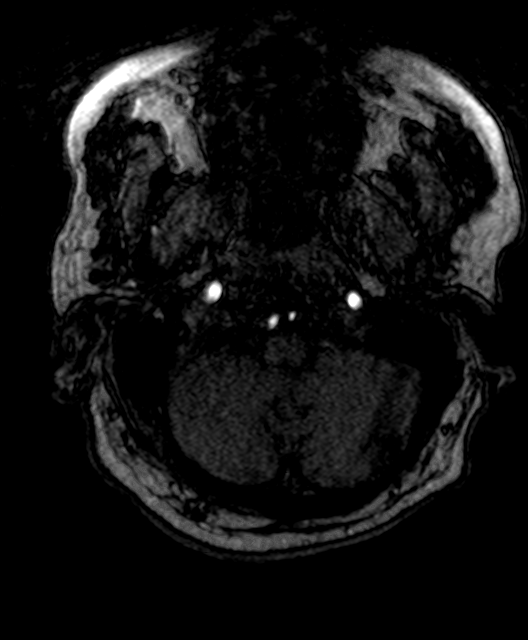
[im 36/131]
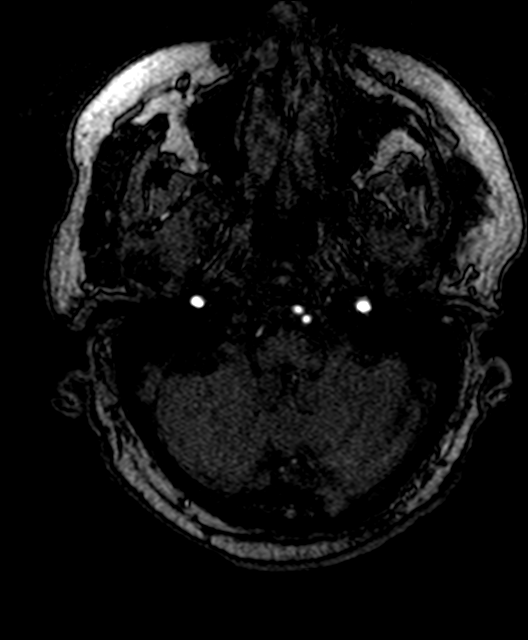

[Series 12: T2 · axial · 5.0mm · 0.67mm/px · z∈[-55,+87]mm · 2 of 23 slices shown (1 of 2)]
[im 1/23]
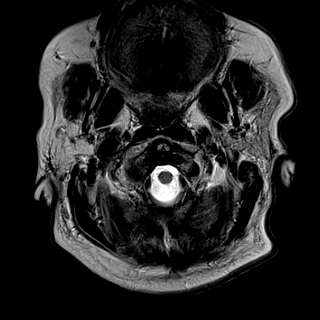
[im 23/23]
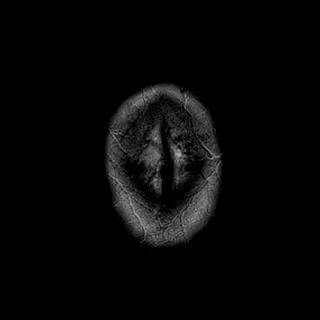

[Series 13: FLAIR · axial · 3.0mm · 0.85mm/px · z∈[-53,+85]mm · 4 of 47 slices shown]
[im 1/47]
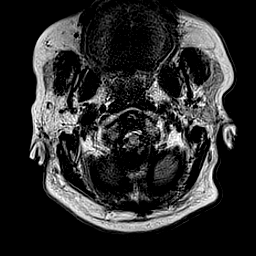
[im 16/47]
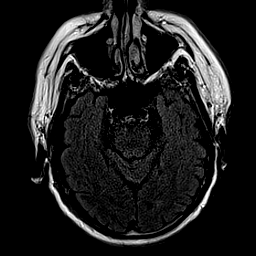
[im 31/47]
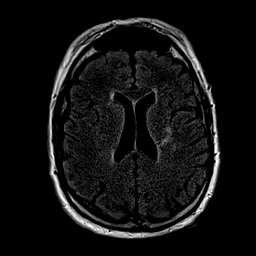
[im 47/47]
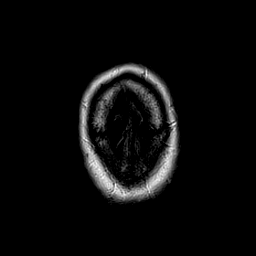

[Series 14: T1 · axial · 2.0mm · 0.44mm/px · z∈[-64,+94]mm · 7 of 80 slices shown]
[im 1/80]
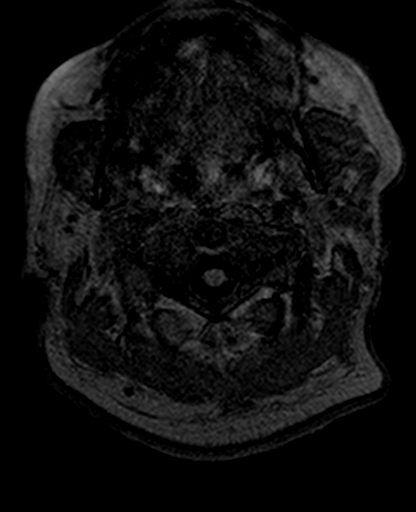
[im 14/80]
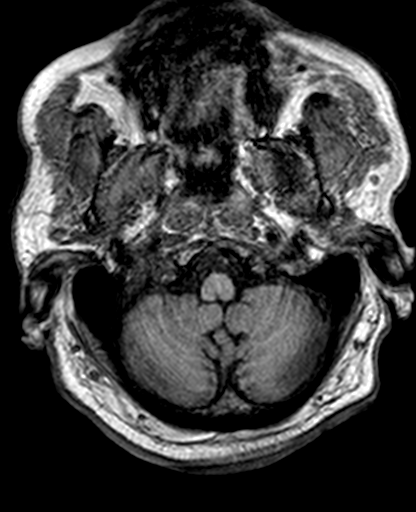
[im 27/80]
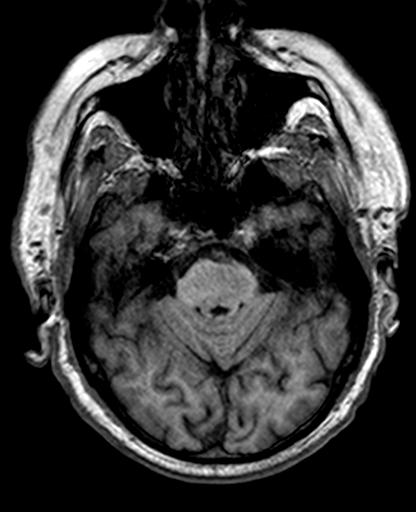
[im 40/80]
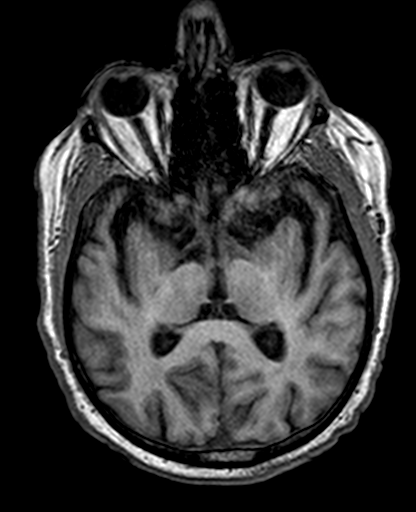
[im 53/80]
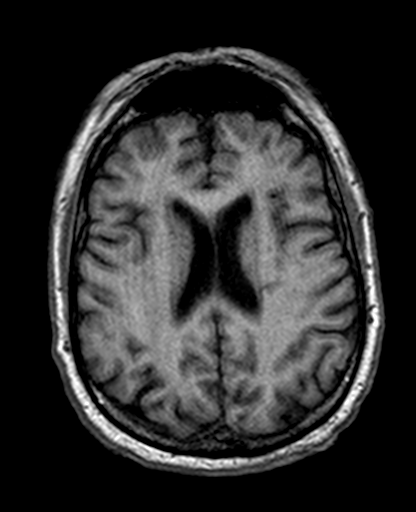
[im 66/80]
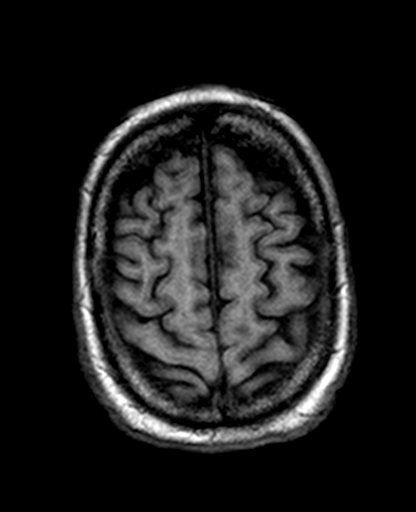
[im 80/80]
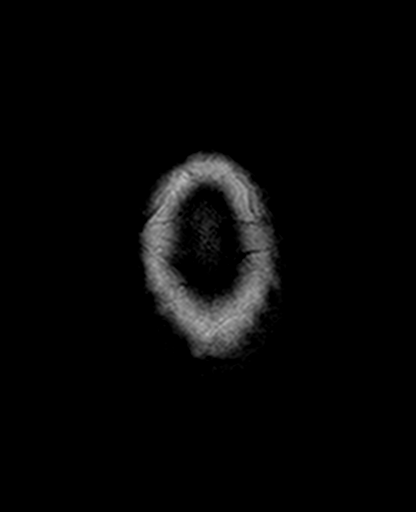

[Series 16: T2 · coronal · 5.0mm · 0.62mm/px · 3 of 28 slices shown (2 of 2)]
[im 1/28]
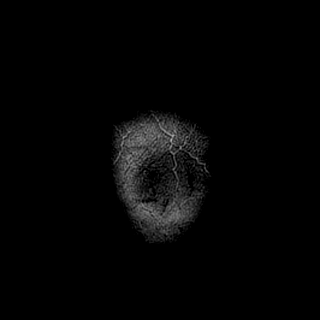
[im 14/28]
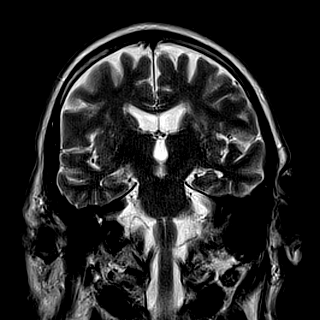
[im 28/28]
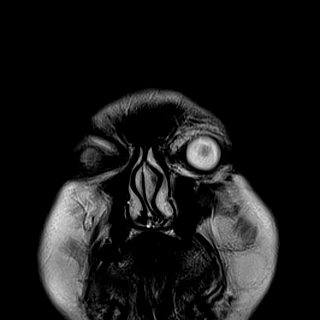

[35 of 48 positions shown; findings below may reference images not displayed]

FINDINGS: MRI HEAD FINDINGS

Brain: 1 cm focus of restricted diffusion in the posterior right
corona radiata.

Overall mild chronic small vessel ischemic change in the cerebral
white matter. There has been a remote small vessel infarct in the
left corona radiata. Age normal brain volume. No mass, hemorrhage,
or hydrocephalus.

Vascular: Arterial findings below. Normal dural venous sinus flow
voids.

Skull and upper cervical spine: Negative for marrow lesion.

Sinuses/Orbits: Negative

MRA HEAD FINDINGS

Motion degraded. No branch occlusion or flow limiting stenosis seen
in the anterior circulation. There is mild narrowing of the right
ICA at the anterior genu. Codominant vertebral arteries. Mild left
V4 segment narrowing. There is a high-grade proximal right P2
segment stenosis with less intense downstream flow. No flow seen
beyond the mid left P2 segment. Negative for aneurysm.
IMPRESSION: Brain MRI:

1. 1 cm acute infarct in the right corona radiata.
2. Mild chronic small vessel ischemia in the cerebral white matter.
Intracranial MRA:

1. Motion degraded.
2. Intracranial atherosclerosis with high-grade proximal right and
mid left P2 segment stenoses.
3. Mild left V4 segment narrowing.

## 2019-02-13 IMAGING — CT CT HEAD CODE STROKE
3 series · 15 of 47 positions shown, 18 images · non-contrast
Comparison: CT HEAD November 03, 2004

CLINICAL DATA: Code stroke. Acute onset LEFT-sided weakness.
History of hypertension, diabetes.

EXAM:
CT HEAD WITHOUT CONTRAST
TECHNIQUE: Contiguous axial images were obtained from the base of the skull
through the vertex without intravenous contrast.

[Series 2: head code stroke wo · axial · 0.45mm/px · z∈[+10,+160]mm · 9 of 36 slices shown, 12 images]
[im 3/36  brain]
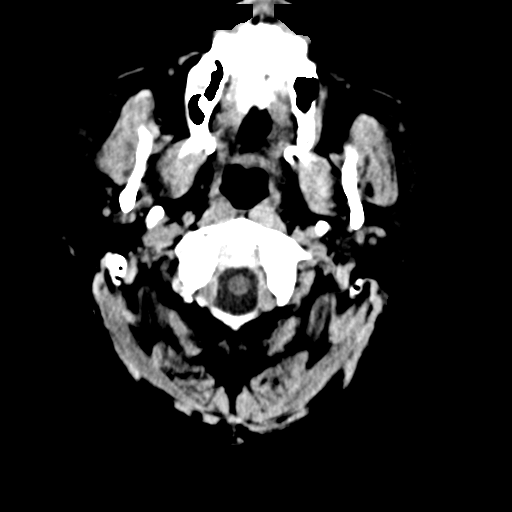
[im 3/36  bone]
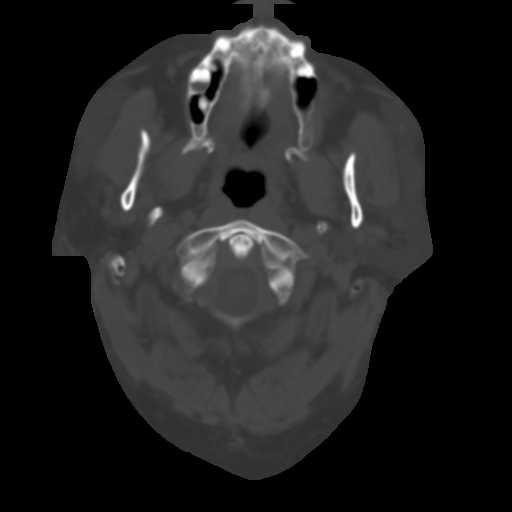
[im 7/36  brain]
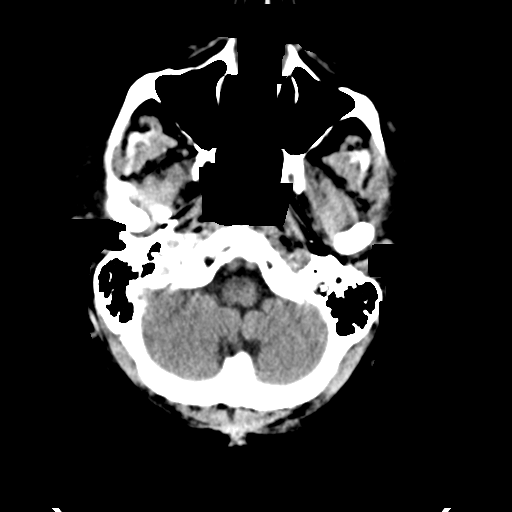
[im 10/36  brain]
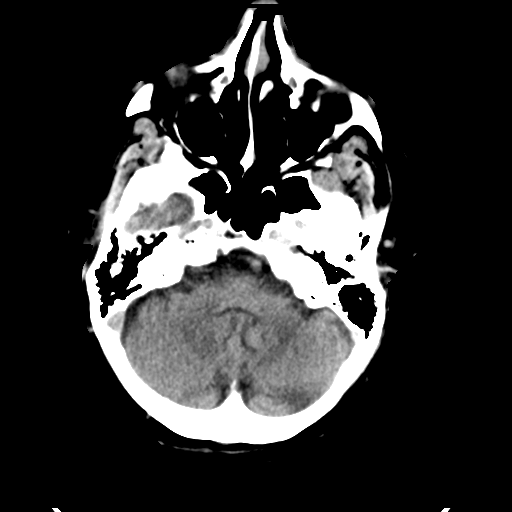
[im 14/36  brain]
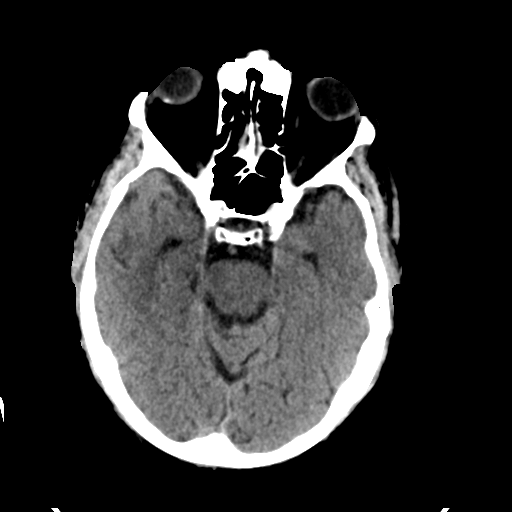
[im 19/36  brain]
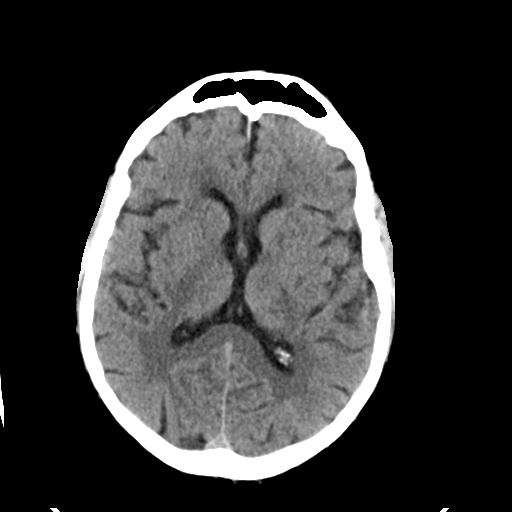
[im 19/36  bone]
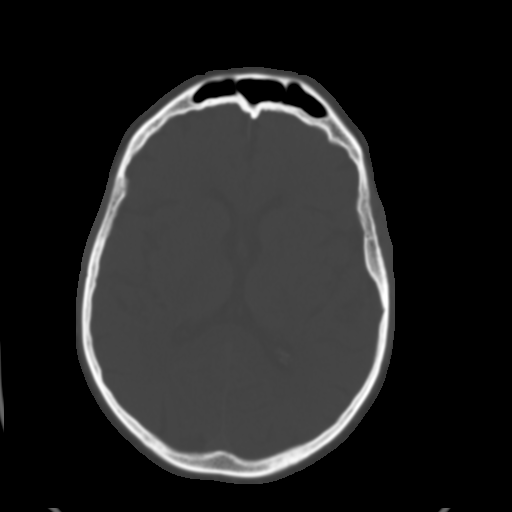
[im 22/36  brain]
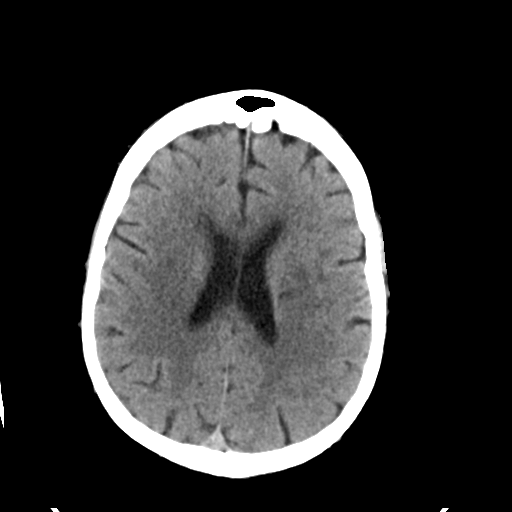
[im 26/36  brain]
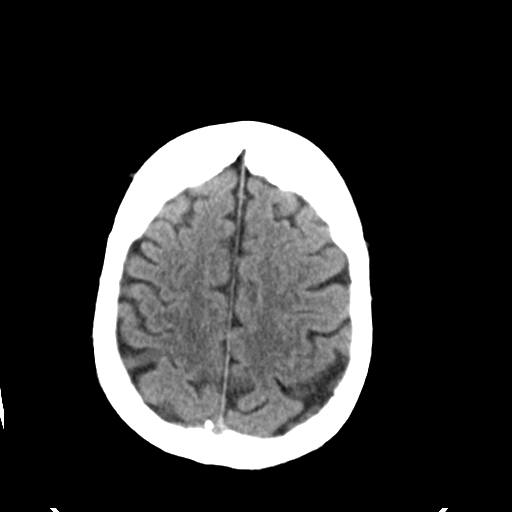
[im 29/36  brain]
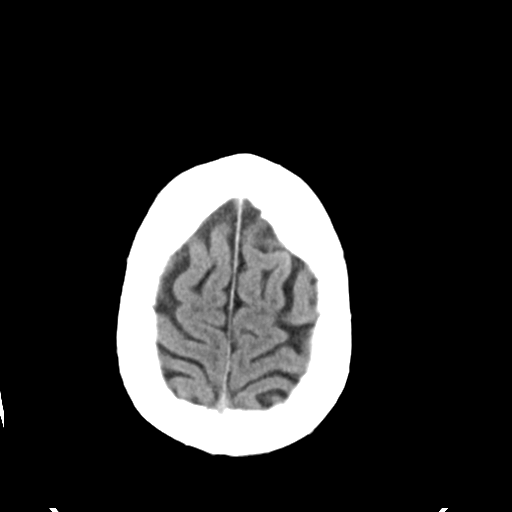
[im 33/36  brain]
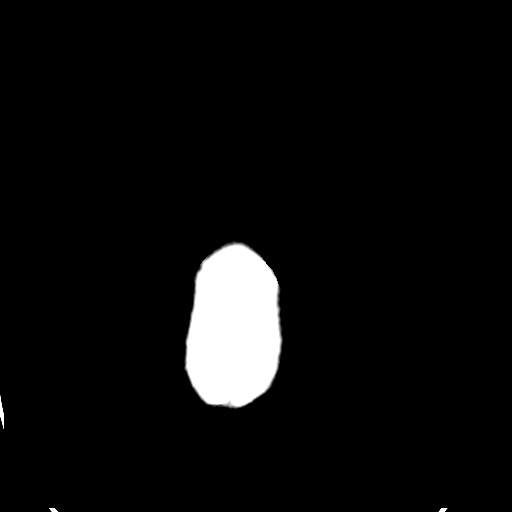
[im 33/36  bone]
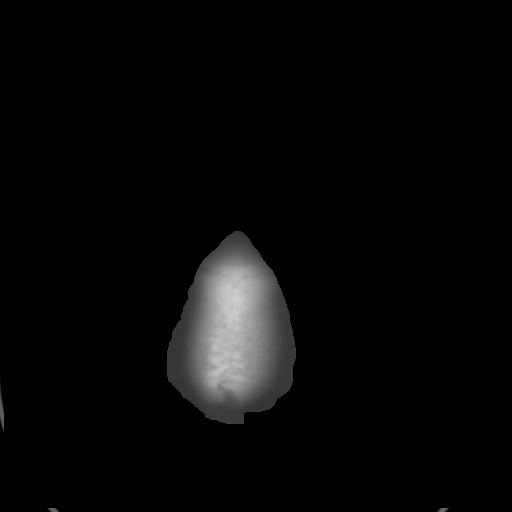

[Series 4: coronal soft tissue · coronal · 0.34mm/px · 3 of 67 slices shown]
[im 23/67  brain]
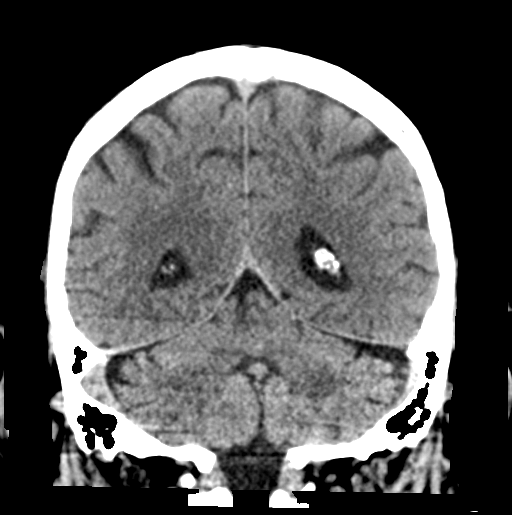
[im 30/67  brain]
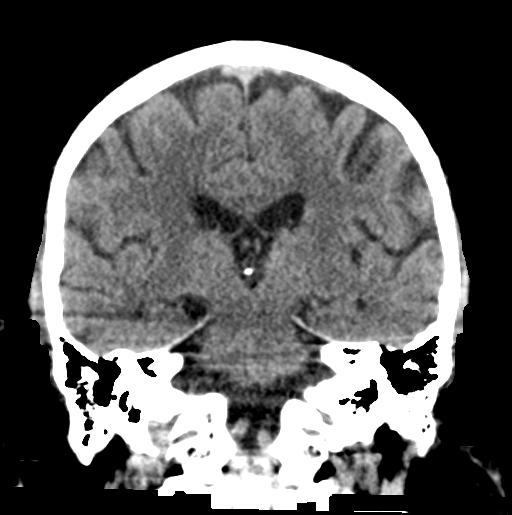
[im 37/67  brain]
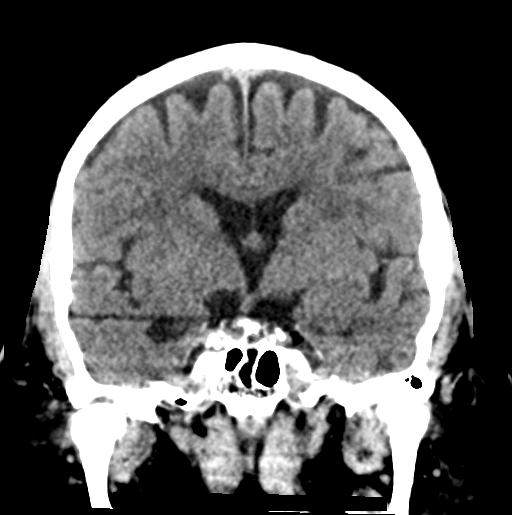

[Series 5: sagittal soft tissue · sagittal · 0.33mm/px · 3 of 56 slices shown]
[im 19/56  brain]
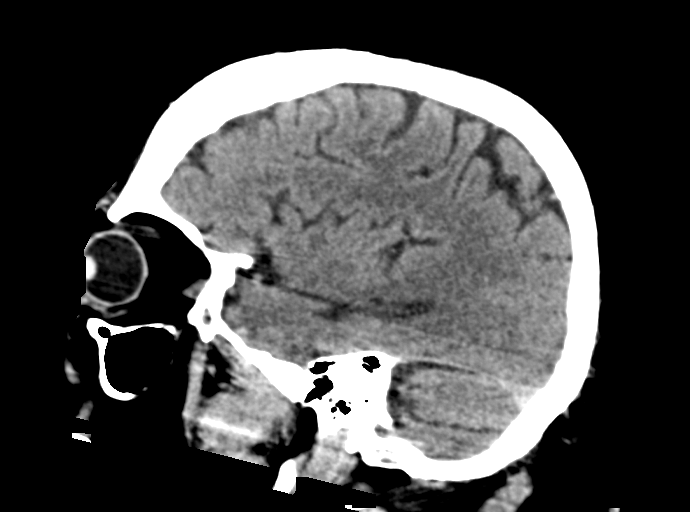
[im 28/56  brain]
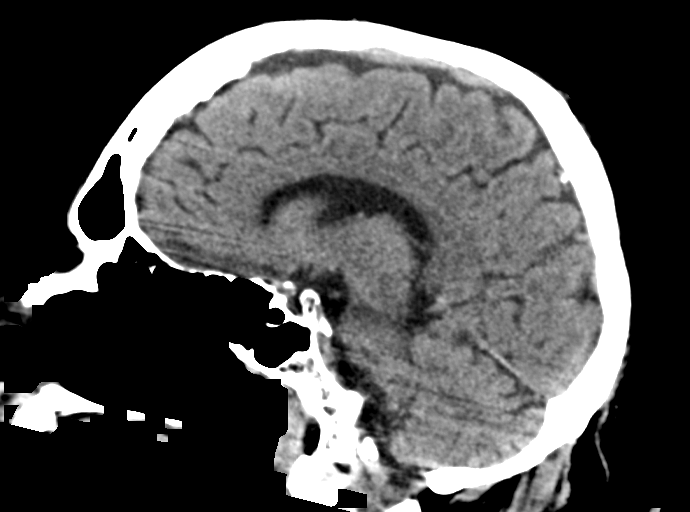
[im 37/56  brain]
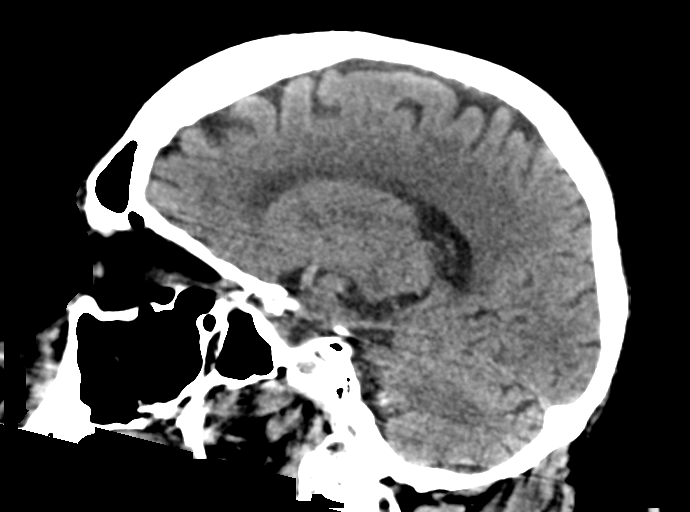

[15 of 47 positions shown; findings below may reference images not displayed]

FINDINGS: BRAIN: No intraparenchymal hemorrhage, mass effect nor midline
shift. The ventricles and sulci are normal for age. Patchy
supratentorial white matter hypodensities within normal range for
patient's age, though non-specific are most compatible with chronic
small vessel ischemic disease. Old LEFT basal ganglia and LEFT
thalamus lacunar infarcts. No acute large vascular territory
infarcts. No abnormal extra-axial fluid collections. Basal cisterns
are patent.

VASCULAR: Moderate to severe calcific atherosclerosis of the carotid
siphons. Tiny densities along bilateral middle cerebral artery's
compatible with atherosclerosis.

SKULL: No skull fracture. No significant scalp soft tissue swelling.

SINUSES/ORBITS: The mastoid air-cells and included paranasal sinuses
are well-aerated.The included ocular globes and orbital contents are
non-suspicious.

OTHER: None.

ASPECTS ([place]ber[place] Stroke Program Early CT Score)

- Ganglionic level infarction (caudate, lentiform nuclei, internal
capsule, insula, M1-M3 cortex): 7

- Supraganglionic infarction (M4-M6 cortex): 3

Total score (0-10 with 10 being normal): 10
IMPRESSION: 1. Negative noncontrast CT HEAD for age.
2. ASPECTS is 10.
3. Critical Value/emergent results were called by telephone at the
time of interpretation on 04/12/2017 at [DATE] to Dr. ADEMAR WIMER ,
who verbally acknowledged these results.

## 2019-03-09 ENCOUNTER — Other Ambulatory Visit: Payer: Self-pay

## 2019-03-09 ENCOUNTER — Encounter: Payer: Self-pay | Admitting: "Endocrinology

## 2019-03-09 ENCOUNTER — Ambulatory Visit (INDEPENDENT_AMBULATORY_CARE_PROVIDER_SITE_OTHER): Payer: PPO | Admitting: "Endocrinology

## 2019-03-09 DIAGNOSIS — I10 Essential (primary) hypertension: Secondary | ICD-10-CM

## 2019-03-09 DIAGNOSIS — N183 Chronic kidney disease, stage 3 (moderate): Secondary | ICD-10-CM | POA: Diagnosis not present

## 2019-03-09 DIAGNOSIS — Z794 Long term (current) use of insulin: Secondary | ICD-10-CM

## 2019-03-09 DIAGNOSIS — I129 Hypertensive chronic kidney disease with stage 1 through stage 4 chronic kidney disease, or unspecified chronic kidney disease: Secondary | ICD-10-CM

## 2019-03-09 DIAGNOSIS — E782 Mixed hyperlipidemia: Secondary | ICD-10-CM | POA: Diagnosis not present

## 2019-03-09 DIAGNOSIS — E1122 Type 2 diabetes mellitus with diabetic chronic kidney disease: Secondary | ICD-10-CM

## 2019-03-09 DIAGNOSIS — E039 Hypothyroidism, unspecified: Secondary | ICD-10-CM | POA: Diagnosis not present

## 2019-03-09 NOTE — Progress Notes (Signed)
03/09/2019                                                     Endocrinology Telehealth Visit Follow up Note -During COVID -19 Pandemic  This visit type was conducted due to national recommendations for restrictions regarding the COVID-19 Pandemic  in an effort to limit this patient's exposure and mitigate transmission of the corona virus.  Due to his co-morbid illnesses, [name] is at  moderate to high risk for complications without adequate follow up.  This format is felt to be most appropriate for him at this time.  I connected with this patient on 03/09/2019   by telephone and verified that I am speaking with the correct person using two identifiers. [name], 07-25-1940. he has verbally consented to this visit. All issues noted in this document were discussed and addressed. The format was not optimal for physical exam.    Subjective:    Patient ID: [name], male    DOB: 1941/01/28, PCP Sharilyn Sites, MD   Past Medical History:  Diagnosis Date  . Anemia   . Biliary calculi, common bile duct   . CRI (chronic renal insufficiency)   . DM (diabetes mellitus) ([place])    Type II  . Elevated liver enzymes   . Esophageal varices ([place]) 03/19/11   mrcp  . History of ERCP 03/21/2011   Extrahepatic biliary obstruction secondary to occluded biliary stent and colon no cholelithiasis, status post sphincterotomy, stent removal and replacement and stone extraction.  Marland Kitchen HTN (hypertension)   . Hypothyroidism   . NASH (nonalcoholic steatohepatitis) 2006   liver transplant  . Pancreatitis chronic 03/19/11   mrcp  . S/P colonoscopy with polypectomy 2004   Dr Gala Romney  . S/P endoscopy 03/2004   Hx esophgaeal varices, pyloric channel ulcer prior to transplant  . Squamous cell cancer of external ear    pinna   Past Surgical History:  Procedure Laterality Date  . BILE DUCT STENT PLACEMENT  03/21/11   Dr. Gala Romney  . CHOLECYSTECTOMY  2006  . COLONOSCOPY  08/12/2002   Dr. Gala Romney- somewhat  diffusely friable rectum and colon, polyp at 30 cm.  . COLONOSCOPY N/A 09/08/2012   Procedure: COLONOSCOPY;  Surgeon: Daneil Dolin, MD;  Location: [facility];  Service: Endoscopy;  Laterality: N/A;  10:30  . ERCP  03/21/2011   Dr. Gala Romney- ERCP with removal of pediatric feeding tube, endoscopic sphinecterotomy with balloon dilation of ampulla followed by stone extraction and stent placement  . ESOPHAGOGASTRODUODENOSCOPY  03/20/2004   Dr. Nash Mantis 2 esophageal varices o/w normal esophageal mucosa, mild changes of snake skin in the gastric mucosa consistent with portal gastropathy, multiple antral erosions  and  a single pyloric channel ulcer benign appearance o/w normal gastric mucosa, normal D1 and S2  . KNEE ARTHROSCOPY WITH EXCISION PLICA Left 0000000   Procedure: KNEE ARTHROSCOPY WITH EXCISION PLICA;  Surgeon: Carole Civil, MD;  Location: [facility];  Service: Orthopedics;  Laterality: Left;  . KNEE ARTHROSCOPY WITH MEDIAL MENISECTOMY Left 11/21/2012   Procedure: KNEE ARTHROSCOPY WITH MEDIAL AND LATERAL MENISECTOMY;  Surgeon: Carole Civil, MD;  Location: [facility];  Service: Orthopedics;  Laterality: Left;  . LIVER TRANSPLANT  2006   UVA-NASH cirrhosis  . MOUTH SURGERY  may 2012  . MRCP  03/19/11  biliary ductal dilatation- stones. esophageal varices  . SPHINCTEROTOMY  03/21/2011   Procedure: SPHINCTEROTOMY;  Surgeon: Daneil Dolin, MD;  Location: [facility];  Service: Gastroenterology;;   Social History   Socioeconomic History  . Marital status: Married    Spouse name: Not on file  . Number of children: 1  . Years of education: 66  . Highest education level: Not on file  Occupational History  . Occupation: RETIRED    Employer: RETIRED  Social Needs  . Financial resource strain: Not on file  . Food insecurity    Worry: Not on file    Inability: Not on file  . Transportation needs    Medical: Not on file    Non-medical: Not on file  Tobacco Use  . Smoking status: Never  Smoker  . Smokeless tobacco: Never Used  Substance and Sexual Activity  . Alcohol use: No  . Drug use: No  . Sexual activity: Not on file  Lifestyle  . Physical activity    Days per week: Not on file    Minutes per session: Not on file  . Stress: Not on file  Relationships  . Social Herbalist on phone: Not on file    Gets together: Not on file    Attends religious service: Not on file    Active member of club or organization: Not on file    Attends meetings of clubs or organizations: Not on file    Relationship status: Not on file  Other Topics Concern  . Not on file  Social History Narrative  . Not on file   Outpatient Encounter Medications as of 03/09/2019  Medication Sig  . aspirin 325 MG tablet Take 1 tablet (325 mg total) by mouth daily.  Marland Kitchen docusate sodium (COLACE) 100 MG capsule Take 100 mg by mouth at bedtime.  Marland Kitchen glucose blood (ONE TOUCH ULTRA TEST) test strip TESTING FOUR TIMES DAILY.  Marland Kitchen insulin aspart (NOVOLOG FLEXPEN) 100 UNIT/ML FlexPen Inject 12-18 Units into the skin 3 (three) times daily with meals. Give per sliding scale three times a day  . Insulin Glargine (LANTUS SOLOSTAR) 100 UNIT/ML Solostar Pen Inject 34 Units into the skin daily at 10 pm.  . levothyroxine (SYNTHROID) 137 MCG tablet Take 1 tablet (137 mcg total) by mouth daily before breakfast.  . lisinopril (PRINIVIL,ZESTRIL) 20 MG tablet Take 20 mg by mouth daily.   . methocarbamol (ROBAXIN) 500 MG tablet Take 500 mg by mouth every 6 (six) hours as needed for muscle spasms.  . Multiple Vitamins-Minerals (MULTIVITAMIN WITH MINERALS) tablet Take 1 tablet by mouth daily.    Marland Kitchen oxyCODONE-acetaminophen (PERCOCET/ROXICET) 5-325 MG tablet Take 1 tablet by mouth every 4 (four) hours as needed.  Marland Kitchen Specialty Vitamins Products (MAGNESIUM, AMINO ACID CHELATE,) 133 MG tablet Take 1 tablet by mouth 2 (two) times daily. Magnesium 133mg    . tacrolimus (PROGRAF) 1 MG capsule Take 1 mg by mouth 2 (two) times daily.     No facility-administered encounter medications on file as of 03/09/2019.    ALLERGIES: Allergies  Allergen Reactions  . Contrast Media [Iodinated Diagnostic Agents] Rash   VACCINATION STATUS: Immunization History  Administered Date(s) Administered  . Influenza Split 02/13/2011, 04/29/2012, 04/20/2013, 05/09/2013  . Influenza, High Dose Seasonal PF 04/26/2015, 04/13/2017  . Influenza-Unspecified 04/08/2013  . Pneumococcal Conjugate-13 07/29/2014  . Pneumococcal Polysaccharide-23 03/17/2010, 04/16/2011    Diabetes He presents for his follow-up diabetic visit. He has type 2 diabetes mellitus. Onset time: He  was diagnosed at approximate age of 21 years. His disease course has been fluctuating. There are no hypoglycemic associated symptoms. Pertinent negatives for hypoglycemia include no confusion, headaches, pallor or seizures. There are no diabetic associated symptoms. Pertinent negatives for diabetes include no chest pain, no fatigue, no polydipsia, no polyphagia, no polyuria and no weakness. There are no hypoglycemic complications. Symptoms are stable. Diabetic complications include a CVA. Risk factors for coronary artery disease include diabetes mellitus, dyslipidemia, hypertension, male sex, obesity, sedentary lifestyle and tobacco exposure. Current diabetic treatment includes intensive insulin program. He is compliant with treatment most of the time. His weight is stable. He is following a generally unhealthy diet. He rarely participates in exercise. His home blood glucose trend is decreasing steadily. His breakfast blood glucose range is generally 140-180 mg/dl. His lunch blood glucose range is generally 140-180 mg/dl. His dinner blood glucose range is generally 140-180 mg/dl. His bedtime blood glucose range is generally 140-180 mg/dl. His overall blood glucose range is 140-180 mg/dl. An ACE inhibitor/angiotensin II receptor blocker is being taken. Eye exam is current.  Hyperlipidemia This  is a chronic problem. The current episode started more than 1 year ago. The problem is controlled. Exacerbating diseases include diabetes, hypothyroidism and obesity. Pertinent negatives include no chest pain, myalgias or shortness of breath. He is currently on no antihyperlipidemic treatment. Risk factors for coronary artery disease include dyslipidemia, diabetes mellitus, hypertension, male sex, obesity and a sedentary lifestyle.  Hypertension This is a chronic problem. The current episode started more than 1 year ago. The problem is uncontrolled. Pertinent negatives include no chest pain, headaches, neck pain, palpitations or shortness of breath. Risk factors for coronary artery disease include dyslipidemia, family history, obesity, male gender and sedentary lifestyle. Past treatments include ACE inhibitors. Hypertensive end-organ damage includes CVA. He is status post liver transplant.. Identifiable causes of hypertension include a thyroid problem.  Thyroid Problem Presents for follow-up visit. Patient reports no constipation, diarrhea, fatigue or palpitations. The symptoms have been improving. Past treatments include levothyroxine. The following procedures have not been performed: thyroidectomy. His past medical history is significant for diabetes and hyperlipidemia.   Review of systems : Limited as above.   Objective:    There were no vitals taken for this visit.  Wt Readings from Last 3 Encounters:  05/06/18 261 lb (118.4 kg)  01/03/18 258 lb (117 kg)  09/05/17 260 lb (117.9 kg)     Results for orders placed or performed in visit on 02/09/19  Comprehensive metabolic panel  Result Value Ref Range   Glucose 121 (H) 65 - 99 mg/dL   BUN 23 8 - 27 mg/dL   Creatinine, Ser 1.21 0.76 - 1.27 mg/dL   GFR calc non Af Amer 57 (L) >59 mL/min/1.73   GFR calc Af Amer 66 >59 mL/min/1.73   BUN/Creatinine Ratio 19 10 - 24   Sodium 139 134 - 144 mmol/L   Potassium 4.7 3.5 - 5.2 mmol/L   Chloride 105  96 - 106 mmol/L   CO2 23 20 - 29 mmol/L   Calcium 8.9 8.6 - 10.2 mg/dL   Total Protein 6.9 6.0 - 8.5 g/dL   Albumin 3.9 3.7 - 4.7 g/dL   Globulin, Total 3.0 1.5 - 4.5 g/dL   Albumin/Globulin Ratio 1.3 1.2 - 2.2   Bilirubin Total 0.7 0.0 - 1.2 mg/dL   Alkaline Phosphatase 68 39 - 117 IU/L   AST 27 0 - 40 IU/L   ALT 22 0 - 44 IU/L  Hgb A1c w/o eAG  Result Value Ref Range   Hgb A1c MFr Bld 7.4 (H) 4.8 - 5.6 %  T4, free  Result Value Ref Range   Free T4 1.18 0.82 - 1.77 ng/dL  TSH  Result Value Ref Range   TSH 3.030 0.450 - 4.500 uIU/mL   Diabetic Labs (most recent): Lab Results  Component Value Date   HGBA1C 7.4 (H) 02/09/2019   HGBA1C 7.2 (H) 08/25/2018   HGBA1C 7.0 (H) 02/10/2018   Lipid Panel     Component Value Date/Time   CHOL 131 08/12/2017   TRIG 113 08/12/2017   HDL 35 08/12/2017   CHOLHDL 4.3 04/13/2017 0547   VLDL 21 04/13/2017 0547   LDLCALC 73 08/12/2017     Assessment & Plan:   1. Uncontrolled type 2 diabetes mellitus with complication, with CKD GFR of 49  -Recently he had developed CVA requiring rehabilitation and physical therapy. He remains at a high risk for more acute and chronic complications of diabetes which include CAD, CVA, CKD, retinopathy, and neuropathy. These are all discussed in detail with the patient.  His previsit labs show A1c of 7.4%, he explained that he had exposure to steroids in the interim.  His most recent glycemic profile is near target.      Glucose logs and insulin administration records pertaining to this visit,  to be scanned into patient's records.  Recent labs reviewed.   - I have re-counseled the patient on diet management   by adopting a carbohydrate restricted / protein rich  Diet.  - he  admits there is a room for improvement in his diet and drink choices. -  Suggestion is made for him to avoid simple carbohydrates  from his diet including Cakes, Sweet Desserts / Pastries, Ice Cream, Soda (diet and regular), Sweet  Tea, Candies, Chips, Cookies, Sweet Pastries,  Store Bought Juices, Alcohol in Excess of  1-2 drinks a day, Artificial Sweeteners, Coffee Creamer, and "Sugar-free" Products. This will help patient to have stable blood glucose profile and potentially avoid unintended weight gain.  - Patient is advised to stick to a routine mealtimes to eat 3 meals  a day and avoid unnecessary snacks ( to snack only to correct hypoglycemia).  - I have approached patient with the following individualized plan to manage diabetes and patient agrees.  -He is advised to continue Lantus 34 units nightly, continue NovoLog 12 units   3 times a day before meals plus correction, associated with strict monitoring of blood glucose 4 times a day - before meals and at bedtime and as needed. - He would have benefited  from continuous glucose monitoring, however he decided not to get it because he cannot afford the monthly co-pay.    - I advised him to contact me for hypoglycemia or hyperglycemia above 200. - Adjustments for hypo-and hyperglycemia is given in written instructions to the patient.  -He is not  a candidate for metformin, SGLT2 inhibitors, and incretin in therapy due to history of liver transplant.    2) BP/HTN:he is advised to home monitor blood pressure and report if > 140/90 on 2 separate readings.   He is advised to continue his current blood pressure medications including lisinopril 20 mg p.o. daily.   3) Lipids/HPL: His lipid panel is controlled with LDL at 73.  He is not on statins, as a result of his history of liver transplant.   4)  Weight/Diet:  exercise, and carbohydrates information provided.  5) Primary  hypothyroidism:  His recent thyroid function tests are consistent with appropriate replacement.  He is advised to continue levothyroxine 137 mcg p.o. daily before breakfast.     - We discussed about the correct intake of his thyroid hormone, on empty stomach at fasting, with water, separated by at  least 30 minutes from breakfast and other medications,  and separated by more than 4 hours from calcium, iron, multivitamins, acid reflux medications (PPIs). -Patient is made aware of the fact that thyroid hormone replacement is needed for life, dose to be adjusted by periodic monitoring of thyroid function tests.    5) Chronic Care/Health Maintenance:  -Patient is on ACEI/ARB  medications and encouraged to continue to follow up with Ophthalmology, Podiatrist at least yearly or according to recommendations, and advised to  stay away from smoking. I have recommended yearly flu vaccine and pneumonia vaccination at least every 5 years; and  sleep for at least 7 hours a day.  - I advised patient to maintain close follow up with Sharilyn Sites, MD for primary care needs.    - Patient Care Time Today:  25 min, of which >50% was spent in  counseling and the rest reviewing his  current and  previous labs/studies, previous treatments, his blood glucose readings, and medications' doses and developing a plan for long-term care based on the latest recommendations for standards of care.   [name] participated in the discussions, expressed understanding, and voiced agreement with the above plans.  All questions were answered to his satisfaction. he is encouraged to contact clinic should he have any questions or concerns prior to his return visit.  follow up plan: -Return in about 4 months (around 07/09/2019) for Bring Meter and Logs- A1c in Office.  Glade Lloyd, MD Phone: [phone]  Fax: [phone]  -  This note was partially dictated with voice recognition software. Similar sounding words can be transcribed inadequately or may not  be corrected upon review.  03/09/2019, 6:18 PM

## 2019-03-23 ENCOUNTER — Other Ambulatory Visit: Payer: Self-pay | Admitting: "Endocrinology

## 2019-04-10 DIAGNOSIS — Z23 Encounter for immunization: Secondary | ICD-10-CM | POA: Diagnosis not present

## 2019-05-04 DIAGNOSIS — H524 Presbyopia: Secondary | ICD-10-CM | POA: Diagnosis not present

## 2019-05-04 DIAGNOSIS — H5202 Hypermetropia, left eye: Secondary | ICD-10-CM | POA: Diagnosis not present

## 2019-05-04 DIAGNOSIS — H52223 Regular astigmatism, bilateral: Secondary | ICD-10-CM | POA: Diagnosis not present

## 2019-05-04 DIAGNOSIS — H5211 Myopia, right eye: Secondary | ICD-10-CM | POA: Diagnosis not present

## 2019-05-05 DIAGNOSIS — E1165 Type 2 diabetes mellitus with hyperglycemia: Secondary | ICD-10-CM | POA: Diagnosis not present

## 2019-05-05 DIAGNOSIS — Z944 Liver transplant status: Secondary | ICD-10-CM | POA: Diagnosis not present

## 2019-05-05 DIAGNOSIS — I10 Essential (primary) hypertension: Secondary | ICD-10-CM | POA: Diagnosis not present

## 2019-05-05 DIAGNOSIS — E039 Hypothyroidism, unspecified: Secondary | ICD-10-CM | POA: Diagnosis not present

## 2019-05-05 DIAGNOSIS — Z6838 Body mass index (BMI) 38.0-38.9, adult: Secondary | ICD-10-CM | POA: Diagnosis not present

## 2019-05-11 DIAGNOSIS — E1165 Type 2 diabetes mellitus with hyperglycemia: Secondary | ICD-10-CM | POA: Diagnosis not present

## 2019-05-11 DIAGNOSIS — Z944 Liver transplant status: Secondary | ICD-10-CM | POA: Diagnosis not present

## 2019-05-11 DIAGNOSIS — E039 Hypothyroidism, unspecified: Secondary | ICD-10-CM | POA: Diagnosis not present

## 2019-05-11 DIAGNOSIS — I10 Essential (primary) hypertension: Secondary | ICD-10-CM | POA: Diagnosis not present

## 2019-05-12 DIAGNOSIS — H25013 Cortical age-related cataract, bilateral: Secondary | ICD-10-CM | POA: Diagnosis not present

## 2019-05-12 DIAGNOSIS — H2513 Age-related nuclear cataract, bilateral: Secondary | ICD-10-CM | POA: Diagnosis not present

## 2019-05-12 DIAGNOSIS — H25043 Posterior subcapsular polar age-related cataract, bilateral: Secondary | ICD-10-CM | POA: Diagnosis not present

## 2019-05-12 DIAGNOSIS — H18413 Arcus senilis, bilateral: Secondary | ICD-10-CM | POA: Diagnosis not present

## 2019-05-15 DIAGNOSIS — H40023 Open angle with borderline findings, high risk, bilateral: Secondary | ICD-10-CM | POA: Diagnosis not present

## 2019-07-13 ENCOUNTER — Other Ambulatory Visit: Payer: Self-pay

## 2019-07-13 ENCOUNTER — Encounter: Payer: Self-pay | Admitting: "Endocrinology

## 2019-07-13 ENCOUNTER — Ambulatory Visit (INDEPENDENT_AMBULATORY_CARE_PROVIDER_SITE_OTHER): Payer: PPO | Admitting: "Endocrinology

## 2019-07-13 VITALS — BP 158/92 | HR 76 | Wt 260.6 lb

## 2019-07-13 DIAGNOSIS — E782 Mixed hyperlipidemia: Secondary | ICD-10-CM

## 2019-07-13 DIAGNOSIS — N183 Chronic kidney disease, stage 3 unspecified: Secondary | ICD-10-CM | POA: Diagnosis not present

## 2019-07-13 DIAGNOSIS — E1165 Type 2 diabetes mellitus with hyperglycemia: Secondary | ICD-10-CM | POA: Diagnosis not present

## 2019-07-13 DIAGNOSIS — I10 Essential (primary) hypertension: Secondary | ICD-10-CM | POA: Diagnosis not present

## 2019-07-13 DIAGNOSIS — E1122 Type 2 diabetes mellitus with diabetic chronic kidney disease: Secondary | ICD-10-CM | POA: Diagnosis not present

## 2019-07-13 DIAGNOSIS — E039 Hypothyroidism, unspecified: Secondary | ICD-10-CM | POA: Diagnosis not present

## 2019-07-13 DIAGNOSIS — Z794 Long term (current) use of insulin: Secondary | ICD-10-CM | POA: Diagnosis not present

## 2019-07-13 LAB — POCT GLYCOSYLATED HEMOGLOBIN (HGB A1C): Hemoglobin A1C: 6.8 % — AB (ref 4.0–5.6)

## 2019-07-13 MED ORDER — LEVOTHYROXINE SODIUM 137 MCG PO TABS: 137.0000 ug | ORAL_TABLET | Freq: Every day | ORAL | 6 refills | Status: DC

## 2019-07-13 MED ORDER — NOVOLOG FLEXPEN 100 UNIT/ML ~~LOC~~ SOPN: 12.0000 [IU] | PEN_INJECTOR | Freq: Three times a day (TID) | SUBCUTANEOUS | 1 refills | Status: DC

## 2019-07-13 MED ORDER — LANTUS SOLOSTAR 100 UNIT/ML ~~LOC~~ SOPN: 34.0000 [IU] | PEN_INJECTOR | Freq: Every day | SUBCUTANEOUS | 1 refills | Status: DC

## 2019-07-13 NOTE — Progress Notes (Signed)
07/13/2019                                                     Endocrinology Telehealth Visit Follow up Note -During COVID -19 Pandemic  This visit type was conducted due to national recommendations for restrictions regarding the COVID-19 Pandemic  in an effort to limit this patient's exposure and mitigate transmission of the corona virus.  Due to his co-morbid illnesses, [name] is at  moderate to high risk for complications without adequate follow up.  This format is felt to be most appropriate for him at this time.  I connected with this patient on 07/13/2019   by telephone and verified that I am speaking with the correct person using two identifiers. [name], 1941-02-09. he has verbally consented to this visit. All issues noted in this document were discussed and addressed. The format was not optimal for physical exam.    Subjective:    Patient ID: [name], male    DOB: Dec 06, 1940, PCP Sharilyn Sites, MD   Past Medical History:  Diagnosis Date  . Anemia   . Biliary calculi, common bile duct   . CRI (chronic renal insufficiency)   . DM (diabetes mellitus) ([place])    Type II  . Elevated liver enzymes   . Esophageal varices ([place]) 03/19/11   mrcp  . History of ERCP 03/21/2011   Extrahepatic biliary obstruction secondary to occluded biliary stent and colon no cholelithiasis, status post sphincterotomy, stent removal and replacement and stone extraction.  Marland Kitchen HTN (hypertension)   . Hypothyroidism   . NASH (nonalcoholic steatohepatitis) 2006   liver transplant  . Pancreatitis chronic 03/19/11   mrcp  . S/P colonoscopy with polypectomy 2004   Dr Gala Romney  . S/P endoscopy 03/2004   Hx esophgaeal varices, pyloric channel ulcer prior to transplant  . Squamous cell cancer of external ear    pinna   Past Surgical History:  Procedure Laterality Date  . BILE DUCT STENT PLACEMENT  03/21/11   Dr. Gala Romney  . CHOLECYSTECTOMY  2006  . COLONOSCOPY  08/12/2002   Dr. Gala Romney- somewhat  diffusely friable rectum and colon, polyp at 30 cm.  . COLONOSCOPY N/A 09/08/2012   Procedure: COLONOSCOPY;  Surgeon: Daneil Dolin, MD;  Location: [facility];  Service: Endoscopy;  Laterality: N/A;  10:30  . ERCP  03/21/2011   Dr. Gala Romney- ERCP with removal of pediatric feeding tube, endoscopic sphinecterotomy with balloon dilation of ampulla followed by stone extraction and stent placement  . ESOPHAGOGASTRODUODENOSCOPY  03/20/2004   Dr. Nash Mantis 2 esophageal varices o/w normal esophageal mucosa, mild changes of snake skin in the gastric mucosa consistent with portal gastropathy, multiple antral erosions  and  a single pyloric channel ulcer benign appearance o/w normal gastric mucosa, normal D1 and S2  . KNEE ARTHROSCOPY WITH EXCISION PLICA Left 0000000   Procedure: KNEE ARTHROSCOPY WITH EXCISION PLICA;  Surgeon: Carole Civil, MD;  Location: [facility];  Service: Orthopedics;  Laterality: Left;  . KNEE ARTHROSCOPY WITH MEDIAL MENISECTOMY Left 11/21/2012   Procedure: KNEE ARTHROSCOPY WITH MEDIAL AND LATERAL MENISECTOMY;  Surgeon: Carole Civil, MD;  Location: [facility];  Service: Orthopedics;  Laterality: Left;  . LIVER TRANSPLANT  2006   UVA-NASH cirrhosis  . MOUTH SURGERY  may 2012  . MRCP  03/19/11  biliary ductal dilatation- stones. esophageal varices  . SPHINCTEROTOMY  03/21/2011   Procedure: SPHINCTEROTOMY;  Surgeon: Daneil Dolin, MD;  Location: [facility];  Service: Gastroenterology;;   Social History   Socioeconomic History  . Marital status: Married    Spouse name: Not on file  . Number of children: 1  . Years of education: 53  . Highest education level: Not on file  Occupational History  . Occupation: RETIRED    Employer: RETIRED  Tobacco Use  . Smoking status: Never Smoker  . Smokeless tobacco: Never Used  Substance and Sexual Activity  . Alcohol use: No  . Drug use: No  . Sexual activity: Not on file  Other Topics Concern  . Not on file  Social History Narrative   . Not on file   Social Determinants of Health   Financial Resource Strain:   . Difficulty of Paying Living Expenses: Not on file  Food Insecurity:   . Worried About Charity fundraiser in the Last Year: Not on file  . Ran Out of Food in the Last Year: Not on file  Transportation Needs:   . Lack of Transportation (Medical): Not on file  . Lack of Transportation (Non-Medical): Not on file  Physical Activity:   . Days of Exercise per Week: Not on file  . Minutes of Exercise per Session: Not on file  Stress:   . Feeling of Stress : Not on file  Social Connections:   . Frequency of Communication with Friends and Family: Not on file  . Frequency of Social Gatherings with Friends and Family: Not on file  . Attends Religious Services: Not on file  . Active Member of Clubs or Organizations: Not on file  . Attends Archivist Meetings: Not on file  . Marital Status: Not on file   Outpatient Encounter Medications as of 07/13/2019  Medication Sig  . aspirin 325 MG tablet Take 1 tablet (325 mg total) by mouth daily.  Marland Kitchen docusate sodium (COLACE) 100 MG capsule Take 100 mg by mouth at bedtime.  . insulin aspart (NOVOLOG FLEXPEN) 100 UNIT/ML FlexPen Inject 12-18 Units into the skin 3 (three) times daily with meals. Give per sliding scale three times a day  . levothyroxine (SYNTHROID) 137 MCG tablet Take 1 tablet (137 mcg total) by mouth daily before breakfast.  . methocarbamol (ROBAXIN) 500 MG tablet Take 500 mg by mouth every 6 (six) hours as needed for muscle spasms.  . Multiple Vitamins-Minerals (MULTIVITAMIN WITH MINERALS) tablet Take 1 tablet by mouth daily.    Marland Kitchen olmesartan (BENICAR) 20 MG tablet Take 20 mg by mouth daily.  Glory Rosebush ULTRA test strip TESTING FOUR TIMES DAILY.  Marland Kitchen oxyCODONE-acetaminophen (PERCOCET/ROXICET) 5-325 MG tablet Take 1 tablet by mouth every 4 (four) hours as needed.  Marland Kitchen Specialty Vitamins Products (MAGNESIUM, AMINO ACID CHELATE,) 133 MG tablet Take 1 tablet  by mouth 2 (two) times daily. Magnesium 133mg    . tacrolimus (PROGRAF) 1 MG capsule Take 0.5 mg by mouth 2 (two) times daily.   . [DISCONTINUED] insulin aspart (NOVOLOG FLEXPEN) 100 UNIT/ML FlexPen Inject 12-18 Units into the skin 3 (three) times daily with meals. Give per sliding scale three times a day  . [DISCONTINUED] Insulin Glargine (LANTUS SOLOSTAR) 100 UNIT/ML Solostar Pen Inject 34 Units into the skin daily at 10 pm.  . [DISCONTINUED] levothyroxine (SYNTHROID) 137 MCG tablet Take 1 tablet (137 mcg total) by mouth daily before breakfast.  . [DISCONTINUED] lisinopril (PRINIVIL,ZESTRIL) 20 MG tablet  Take 20 mg by mouth daily.    No facility-administered encounter medications on file as of 07/13/2019.   ALLERGIES: Allergies  Allergen Reactions  . Contrast Media [Iodinated Diagnostic Agents] Rash   VACCINATION STATUS: Immunization History  Administered Date(s) Administered  . Influenza Split 02/13/2011, 04/29/2012, 04/20/2013, 05/09/2013  . Influenza, High Dose Seasonal PF 04/26/2015, 04/13/2017  . Influenza-Unspecified 04/08/2013  . Pneumococcal Conjugate-13 07/29/2014  . Pneumococcal Polysaccharide-23 03/17/2010, 04/16/2011    Diabetes He presents for his follow-up diabetic visit. He has type 2 diabetes mellitus. Onset time: He was diagnosed at approximate age of 19 years. His disease course has been fluctuating. There are no hypoglycemic associated symptoms. Pertinent negatives for hypoglycemia include no confusion, headaches, pallor or seizures. There are no diabetic associated symptoms. Pertinent negatives for diabetes include no chest pain, no fatigue, no polydipsia, no polyphagia, no polyuria and no weakness. There are no hypoglycemic complications. Symptoms are stable. Diabetic complications include a CVA. Risk factors for coronary artery disease include diabetes mellitus, dyslipidemia, hypertension, male sex, obesity, sedentary lifestyle and tobacco exposure. Current diabetic  treatment includes intensive insulin program. He is compliant with treatment most of the time. His weight is stable. He is following a generally unhealthy diet. He rarely participates in exercise. His home blood glucose trend is decreasing steadily. His breakfast blood glucose range is generally 140-180 mg/dl. His lunch blood glucose range is generally 140-180 mg/dl. His dinner blood glucose range is generally 140-180 mg/dl. His bedtime blood glucose range is generally 140-180 mg/dl. His overall blood glucose range is 140-180 mg/dl. An ACE inhibitor/angiotensin II receptor blocker is being taken. Eye exam is current.  Hyperlipidemia This is a chronic problem. The current episode started more than 1 year ago. The problem is controlled. Exacerbating diseases include diabetes, hypothyroidism and obesity. Pertinent negatives include no chest pain, myalgias or shortness of breath. He is currently on no antihyperlipidemic treatment. Risk factors for coronary artery disease include dyslipidemia, diabetes mellitus, hypertension, male sex, obesity and a sedentary lifestyle.  Hypertension This is a chronic problem. The current episode started more than 1 year ago. The problem is uncontrolled. Pertinent negatives include no chest pain, headaches, neck pain, palpitations or shortness of breath. Risk factors for coronary artery disease include dyslipidemia, family history, obesity, male gender and sedentary lifestyle. Past treatments include ACE inhibitors. Hypertensive end-organ damage includes CVA. He is status post liver transplant.. Identifiable causes of hypertension include a thyroid problem.  Thyroid Problem Presents for follow-up visit. Patient reports no constipation, diarrhea, fatigue or palpitations. The symptoms have been improving. Past treatments include levothyroxine. The following procedures have not been performed: thyroidectomy. His past medical history is significant for diabetes and hyperlipidemia.    Review of systems : Limited as above.   Objective:    BP (!) 158/92   Pulse 76   Wt 260 lb 9.6 oz (118.2 kg)   BMI 37.93 kg/m   Wt Readings from Last 3 Encounters:  07/13/19 260 lb 9.6 oz (118.2 kg)  05/06/18 261 lb (118.4 kg)  01/03/18 258 lb (117 kg)     Results for orders placed or performed in visit on 07/13/19  HgB A1c  Result Value Ref Range   Hemoglobin A1C 6.8 (A) 4.0 - 5.6 %   HbA1c POC (<> result, manual entry)     HbA1c, POC (prediabetic range)     HbA1c, POC (controlled diabetic range)     Diabetic Labs (most recent): Lab Results  Component Value Date   HGBA1C 6.8 (  A) 07/13/2019   HGBA1C 7.4 (H) 02/09/2019   HGBA1C 7.2 (H) 08/25/2018   Lipid Panel     Component Value Date/Time   CHOL 131 08/12/2017 0000   TRIG 113 08/12/2017 0000   HDL 35 08/12/2017 0000   CHOLHDL 4.3 04/13/2017 0547   VLDL 21 04/13/2017 0547   LDLCALC 73 08/12/2017 0000     Assessment & Plan:   1. Uncontrolled type 2 diabetes mellitus with complication, with CKD GFR of 49, CVA - -Recently he had developed CVA requiring rehabilitation and physical therapy. He remains at a high risk for more acute and chronic complications of diabetes which include CAD, CVA, CKD, retinopathy, and neuropathy. These are all discussed in detail with the patient.  He presents with controlled glycemic profile both fasting and postprandial.  His point-of-care A1c today 6.8% improving from 7.4%.        Glucose logs and insulin administration records pertaining to this visit,  to be scanned into patient's records.  Recent labs reviewed.   - I have re-counseled the patient on diet management   by adopting a carbohydrate restricted / protein rich  Diet.  - he  admits there is a room for improvement in his diet and drink choices. -  Suggestion is made for him to avoid simple carbohydrates  from his diet including Cakes, Sweet Desserts / Pastries, Ice Cream, Soda (diet and regular), Sweet Tea, Candies,  Chips, Cookies, Sweet Pastries,  Store Bought Juices, Alcohol in Excess of  1-2 drinks a day, Artificial Sweeteners, Coffee Creamer, and "Sugar-free" Products. This will help patient to have stable blood glucose profile and potentially avoid unintended weight gain.   - Patient is advised to stick to a routine mealtimes to eat 3 meals  a day and avoid unnecessary snacks ( to snack only to correct hypoglycemia).  - I have approached patient with the following individualized plan to manage diabetes and patient agrees.  -He will continue to require intensive treatment with basal/bolus insulin in order for him to maintain control of diabetes to target. -He is  advised to continue Lantus 34 units nightly, continue NovoLog 12 units   3 times a day before meals plus correction, associated with strict monitoring of blood glucose 4 times a day - before meals and at bedtime and as needed. - He would have benefited  from continuous glucose monitoring, however he decided not to get it because he cannot afford the monthly co-pay.    - I advised him to contact me for hypoglycemia or hyperglycemia above 200. - Adjustments for hypo-and hyperglycemia is given in written instructions to the patient.  -He is not a candidate for metformin, SGLT2 inhibitors, nor incretin in therapy due to history of liver transplant.    2) BP/HTN: His blood pressure is not controlled to target.   He is advised to continue his current blood pressure medications including lisinopril 20 mg p.o. daily.     3) Lipids/HPL: His lipid panel is controlled with LDL at 73.  He is not on statins, as a result of his history of liver transplant.   4)  Weight/Diet:  exercise, and carbohydrates information provided.  5) Primary hypothyroidism:  He is advised to continue levothyroxine 137 mcg p.o. daily before breakfast.     - We discussed about the correct intake of his thyroid hormone, on empty stomach at fasting, with water, separated by at  least 30 minutes from breakfast and other medications,  and separated by more than  4 hours from calcium, iron, multivitamins, acid reflux medications (PPIs). -Patient is made aware of the fact that thyroid hormone replacement is needed for life, dose to be adjusted by periodic monitoring of thyroid function tests.   5) Chronic Care/Health Maintenance:  -Patient is on ACEI/ARB  medications and encouraged to continue to follow up with Ophthalmology, Podiatrist at least yearly or according to recommendations, and advised to  stay away from smoking. I have recommended yearly flu vaccine and pneumonia vaccination at least every 5 years; and  sleep for at least 7 hours a day.  - I advised patient to maintain close follow up with Sharilyn Sites, MD for primary care needs.  - Time spent on this patient care encounter:  35 min, of which >50% was spent in  counseling and the rest reviewing his  current and  previous labs/studies ( including abstraction from other facilities),  previous treatments, his blood glucose readings, and medications' doses and developing a plan for long-term care based on the latest recommendations for standards of care; and documenting his care.  [name] participated in the discussions, expressed understanding, and voiced agreement with the above plans.  All questions were answered to his satisfaction. he is encouraged to contact clinic should he have any questions or concerns prior to his return visit.   follow up plan: -Return he does labs elsewhere, for Bring Meter and Logs- A1c in Office, Follow up with Pre-visit Labs.  Kyle Lloyd, MD Phone: [phone]  Fax: [phone]  -  This note was partially dictated with voice recognition software. Similar sounding words can be transcribed inadequately or may not  be corrected upon review.  07/13/2019, 1:29 PM

## 2019-07-22 DIAGNOSIS — I10 Essential (primary) hypertension: Secondary | ICD-10-CM | POA: Diagnosis not present

## 2019-07-22 DIAGNOSIS — Z0001 Encounter for general adult medical examination with abnormal findings: Secondary | ICD-10-CM | POA: Diagnosis not present

## 2019-07-22 DIAGNOSIS — E7849 Other hyperlipidemia: Secondary | ICD-10-CM | POA: Diagnosis not present

## 2019-07-22 DIAGNOSIS — Z6839 Body mass index (BMI) 39.0-39.9, adult: Secondary | ICD-10-CM | POA: Diagnosis not present

## 2019-07-22 DIAGNOSIS — Z1389 Encounter for screening for other disorder: Secondary | ICD-10-CM | POA: Diagnosis not present

## 2019-07-22 DIAGNOSIS — E039 Hypothyroidism, unspecified: Secondary | ICD-10-CM | POA: Diagnosis not present

## 2019-08-11 DIAGNOSIS — Z944 Liver transplant status: Secondary | ICD-10-CM | POA: Diagnosis not present

## 2019-09-09 ENCOUNTER — Other Ambulatory Visit: Payer: Self-pay | Admitting: "Endocrinology

## 2019-10-16 DIAGNOSIS — E119 Type 2 diabetes mellitus without complications: Secondary | ICD-10-CM | POA: Diagnosis not present

## 2019-10-16 DIAGNOSIS — Z79899 Other long term (current) drug therapy: Secondary | ICD-10-CM | POA: Diagnosis not present

## 2019-10-16 DIAGNOSIS — N182 Chronic kidney disease, stage 2 (mild): Secondary | ICD-10-CM | POA: Diagnosis not present

## 2019-10-16 DIAGNOSIS — Z944 Liver transplant status: Secondary | ICD-10-CM | POA: Diagnosis not present

## 2019-10-16 DIAGNOSIS — I10 Essential (primary) hypertension: Secondary | ICD-10-CM | POA: Diagnosis not present

## 2019-10-16 DIAGNOSIS — Z48298 Encounter for aftercare following other organ transplant: Secondary | ICD-10-CM | POA: Diagnosis not present

## 2019-10-23 DIAGNOSIS — Z6841 Body Mass Index (BMI) 40.0 and over, adult: Secondary | ICD-10-CM | POA: Diagnosis not present

## 2019-10-23 DIAGNOSIS — I10 Essential (primary) hypertension: Secondary | ICD-10-CM | POA: Diagnosis not present

## 2019-11-09 DIAGNOSIS — Z944 Liver transplant status: Secondary | ICD-10-CM | POA: Diagnosis not present

## 2019-11-09 DIAGNOSIS — E1165 Type 2 diabetes mellitus with hyperglycemia: Secondary | ICD-10-CM | POA: Diagnosis not present

## 2019-11-09 LAB — HEMOGLOBIN A1C: Hemoglobin A1C: 6.9

## 2019-11-11 ENCOUNTER — Other Ambulatory Visit: Payer: Self-pay

## 2019-11-11 ENCOUNTER — Encounter: Payer: Self-pay | Admitting: "Endocrinology

## 2019-11-11 ENCOUNTER — Ambulatory Visit (INDEPENDENT_AMBULATORY_CARE_PROVIDER_SITE_OTHER): Payer: PPO | Admitting: "Endocrinology

## 2019-11-11 VITALS — BP 173/81 | HR 74 | Ht 69.5 in | Wt 263.8 lb

## 2019-11-11 DIAGNOSIS — E782 Mixed hyperlipidemia: Secondary | ICD-10-CM | POA: Diagnosis not present

## 2019-11-11 DIAGNOSIS — I10 Essential (primary) hypertension: Secondary | ICD-10-CM | POA: Diagnosis not present

## 2019-11-11 DIAGNOSIS — E1122 Type 2 diabetes mellitus with diabetic chronic kidney disease: Secondary | ICD-10-CM

## 2019-11-11 DIAGNOSIS — N183 Chronic kidney disease, stage 3 unspecified: Secondary | ICD-10-CM | POA: Diagnosis not present

## 2019-11-11 DIAGNOSIS — Z794 Long term (current) use of insulin: Secondary | ICD-10-CM

## 2019-11-11 DIAGNOSIS — E039 Hypothyroidism, unspecified: Secondary | ICD-10-CM

## 2019-11-11 NOTE — Progress Notes (Signed)
11/11/2019                                                     Endocrinology Telehealth Visit Follow up Note -During COVID -19 Pandemic  This visit type was conducted due to national recommendations for restrictions regarding the COVID-19 Pandemic  in an effort to limit this patient's exposure and mitigate transmission of the corona virus.  Due to his co-morbid illnesses, [name] is at  moderate to high risk for complications without adequate follow up.  This format is felt to be most appropriate for him at this time.  I connected with this patient on 11/11/2019   by telephone and verified that I am speaking with the correct person using two identifiers. [name], 03-Feb-1941. he has verbally consented to this visit. All issues noted in this document were discussed and addressed. The format was not optimal for physical exam.    Subjective:    Patient ID: [name], male    DOB: [birth year], PCP Kyle Sites, MD   Past Medical History:  Diagnosis Date  . Anemia   . Biliary calculi, common bile duct   . CRI (chronic renal insufficiency)   . DM (diabetes mellitus) ([place])    Type II  . Elevated liver enzymes   . Esophageal varices ([place]) 03/19/11   mrcp  . History of ERCP 03/21/2011   Extrahepatic biliary obstruction secondary to occluded biliary stent and colon no cholelithiasis, status post sphincterotomy, stent removal and replacement and stone extraction.  [name] HTN (hypertension)   . Hypothyroidism   . NASH (nonalcoholic steatohepatitis) 2006   liver transplant  . Pancreatitis chronic 03/19/11   mrcp  . S/P colonoscopy with polypectomy 2004   Dr Gala Romney  . S/P endoscopy 03/2004   Hx esophgaeal varices, pyloric channel ulcer prior to transplant  . Squamous cell cancer of external ear    pinna   Past Surgical History:  Procedure Laterality Date  . BILE DUCT STENT PLACEMENT  03/21/11   Dr. Gala Romney  . CHOLECYSTECTOMY  2006  . COLONOSCOPY  08/12/2002   Dr. Gala Romney- somewhat  diffusely friable rectum and colon, polyp at 30 cm.  . COLONOSCOPY N/A 09/08/2012   Procedure: COLONOSCOPY;  Surgeon: Daneil Dolin, MD;  Location: [facility];  Service: Endoscopy;  Laterality: N/A;  10:30  . ERCP  03/21/2011   Dr. Gala Romney- ERCP with removal of pediatric feeding tube, endoscopic sphinecterotomy with balloon dilation of ampulla followed by stone extraction and stent placement  . ESOPHAGOGASTRODUODENOSCOPY  03/20/2004   Dr. Nash Mantis 2 esophageal varices o/w normal esophageal mucosa, mild changes of snake skin in the gastric mucosa consistent with portal gastropathy, multiple antral erosions  and  a single pyloric channel ulcer benign appearance o/w normal gastric mucosa, normal D1 and S2  . KNEE ARTHROSCOPY WITH EXCISION PLICA Left 0000000   Procedure: KNEE ARTHROSCOPY WITH EXCISION PLICA;  Surgeon: Carole Civil, MD;  Location: [facility];  Service: Orthopedics;  Laterality: Left;  . KNEE ARTHROSCOPY WITH MEDIAL MENISECTOMY Left 11/21/2012   Procedure: KNEE ARTHROSCOPY WITH MEDIAL AND LATERAL MENISECTOMY;  Surgeon: Carole Civil, MD;  Location: [facility];  Service: Orthopedics;  Laterality: Left;  . LIVER TRANSPLANT  2006   UVA-NASH cirrhosis  . MOUTH SURGERY  may 2012  . MRCP  03/19/11  biliary ductal dilatation- stones. esophageal varices  . SPHINCTEROTOMY  03/21/2011   Procedure: SPHINCTEROTOMY;  Surgeon: Daneil Dolin, MD;  Location: [facility];  Service: Gastroenterology;;   Social History   Socioeconomic History  . Marital status: Married    Spouse name: Not on file  . Number of children: 1  . Years of education: 60  . Highest education level: Not on file  Occupational History  . Occupation: RETIRED    Employer: RETIRED  Tobacco Use  . Smoking status: Never Smoker  . Smokeless tobacco: Never Used  Substance and Sexual Activity  . Alcohol use: No  . Drug use: No  . Sexual activity: Not on file  Other Topics Concern  . Not on file  Social History Narrative   . Not on file   Social Determinants of Health   Financial Resource Strain:   . Difficulty of Paying Living Expenses:   Food Insecurity:   . Worried About Charity fundraiser in the Last Year:   . Arboriculturist in the Last Year:   Transportation Needs:   . Film/video editor (Medical):   [name] Lack of Transportation (Non-Medical):   Physical Activity:   . Days of Exercise per Week:   . Minutes of Exercise per Session:   Stress:   . Feeling of Stress :   Social Connections:   . Frequency of Communication with Friends and Family:   . Frequency of Social Gatherings with Friends and Family:   . Attends Religious Services:   . Active Member of Clubs or Organizations:   . Attends Archivist Meetings:   [name] Marital Status:    Outpatient Encounter Medications as of 11/11/2019  Medication Sig  . aspirin 325 MG tablet Take 1 tablet (325 mg total) by mouth daily.  [name] docusate sodium (COLACE) 100 MG capsule Take 100 mg by mouth at bedtime.  . insulin aspart (NOVOLOG FLEXPEN) 100 UNIT/ML FlexPen Inject 12-18 Units into the skin 3 (three) times daily with meals. Give per sliding scale three times a day  . Insulin Glargine (LANTUS SOLOSTAR) 100 UNIT/ML Solostar Pen Inject 34 Units into the skin daily at 10 pm.  . levothyroxine (SYNTHROID) 137 MCG tablet Take 1 tablet (137 mcg total) by mouth daily before breakfast.  . Multiple Vitamins-Minerals (MULTIVITAMIN WITH MINERALS) tablet Take 1 tablet by mouth daily.    [name] olmesartan (BENICAR) 40 MG tablet Take 40 mg by mouth daily.  [name] ULTRA test strip TESTING FOUR TIMES DAILY.  [name] oxyCODONE-acetaminophen (PERCOCET/ROXICET) 5-325 MG tablet Take 1 tablet by mouth every 4 (four) hours as needed.  [name] Specialty Vitamins Products (MAGNESIUM, AMINO ACID CHELATE,) 133 MG tablet Take 1 tablet by mouth 2 (two) times daily. Magnesium 133mg    . tacrolimus (PROGRAF) 1 MG capsule Take 0.5 mg by mouth 2 (two) times daily.   . [DISCONTINUED]  methocarbamol (ROBAXIN) 500 MG tablet Take 500 mg by mouth every 6 (six) hours as needed for muscle spasms.  . [DISCONTINUED] olmesartan (BENICAR) 20 MG tablet Take 20 mg by mouth daily.   No facility-administered encounter medications on file as of 11/11/2019.   ALLERGIES: Allergies  Allergen Reactions  . Contrast Media [Iodinated Diagnostic Agents] Rash   VACCINATION STATUS: Immunization History  Administered Date(s) Administered  . Influenza Split 02/13/2011, 04/29/2012, 04/20/2013, 05/09/2013  . Influenza, High Dose Seasonal PF 04/26/2015, 04/13/2017  . Influenza-Unspecified 04/08/2013  . Pneumococcal Conjugate-13 07/29/2014  . Pneumococcal Polysaccharide-23 03/17/2010, 04/16/2011  Diabetes He presents for his follow-up diabetic visit. He has type 2 diabetes mellitus. Onset time: He was diagnosed at approximate age of 50 years. His disease course has been stable. There are no hypoglycemic associated symptoms. Pertinent negatives for hypoglycemia include no confusion, headaches, pallor or seizures. There are no diabetic associated symptoms. Pertinent negatives for diabetes include no chest pain, no fatigue, no polydipsia, no polyphagia, no polyuria and no weakness. There are no hypoglycemic complications. Symptoms are improving. Diabetic complications include a CVA. Risk factors for coronary artery disease include diabetes mellitus, dyslipidemia, hypertension, male sex, obesity, sedentary lifestyle and tobacco exposure. Current diabetic treatment includes intensive insulin program. He is compliant with treatment most of the time. His weight is fluctuating minimally. He is following a generally unhealthy diet. He rarely participates in exercise. His home blood glucose trend is decreasing steadily. His breakfast blood glucose range is generally 140-180 mg/dl. His lunch blood glucose range is generally 140-180 mg/dl. His dinner blood glucose range is generally 140-180 mg/dl. His bedtime blood  glucose range is generally 140-180 mg/dl. His overall blood glucose range is 140-180 mg/dl. (He returns with controlled glycemic profile both fasting and postprandial.  His previsit A1c was 6.9%, improving.) An ACE inhibitor/angiotensin II receptor blocker is being taken. Eye exam is current.  Hyperlipidemia This is a chronic problem. The current episode started more than 1 year ago. The problem is controlled. Exacerbating diseases include diabetes, hypothyroidism and obesity. Pertinent negatives include no chest pain, myalgias or shortness of breath. He is currently on no antihyperlipidemic treatment. Risk factors for coronary artery disease include dyslipidemia, diabetes mellitus, hypertension, male sex, obesity and a sedentary lifestyle.  Hypertension This is a chronic problem. The current episode started more than 1 year ago. The problem is uncontrolled. Pertinent negatives include no chest pain, headaches, neck pain, palpitations or shortness of breath. Risk factors for coronary artery disease include dyslipidemia, family history, obesity, male gender and sedentary lifestyle. Past treatments include ACE inhibitors. Hypertensive end-organ damage includes CVA. He is status post liver transplant.. Identifiable causes of hypertension include a thyroid problem.  Thyroid Problem Presents for follow-up visit. Patient reports no constipation, diarrhea, fatigue or palpitations. The symptoms have been improving. Past treatments include levothyroxine. The following procedures have not been performed: thyroidectomy. His past medical history is significant for diabetes and hyperlipidemia.    Review of systems  Constitutional: + Minimally fluctuating body weight,  current  Body mass index is 38.4 kg/m. , no fatigue, no subjective hyperthermia, no subjective hypothermia Eyes: no blurry vision, no xerophthalmia ENT: no sore throat, no nodules palpated in throat, no dysphagia/odynophagia, no  hoarseness Cardiovascular: no Chest Pain, no Shortness of Breath, no palpitations, no leg swelling Respiratory: no cough, no shortness of breath Gastrointestinal: no Nausea/Vomiting/Diarhhea Musculoskeletal: no muscle/joint aches Skin: no rashes, no hyperemia Neurological: no tremors, no numbness, no tingling, no dizziness Psychiatric: no depression, no anxiety    Objective:    BP (!) 173/81   Pulse 74   Ht 5' 9.5" (1.765 m)   Wt 263 lb 12.8 oz (119.7 kg)   BMI 38.40 kg/m   Wt Readings from Last 3 Encounters:  11/11/19 263 lb 12.8 oz (119.7 kg)  07/13/19 260 lb 9.6 oz (118.2 kg)  05/06/18 261 lb (118.4 kg)     Physical Exam- Limited  Constitutional:  Body mass index is 38.4 kg/m. , not in acute distress, normal state of mind Eyes:  EOMI, no exophthalmos Neck: Supple Thyroid: No gross goiter Respiratory: Adequate  breathing efforts Musculoskeletal: no gross deformities, strength intact in all four extremities, no gross restriction of joint movements Skin:  no rashes, no hyperemia Neurological: no tremor with outstretched hands,  Diabetic foot exam was normal today.  Results for orders placed or performed in visit on 11/11/19  Hemoglobin A1c  Result Value Ref Range   Hemoglobin A1C 6.9    Diabetic Labs (most recent): Lab Results  Component Value Date   HGBA1C 6.9 11/09/2019   HGBA1C 6.8 (A) 07/13/2019   HGBA1C 7.4 (H) 02/09/2019   Lipid Panel     Component Value Date/Time   CHOL 131 08/12/2017 0000   TRIG 113 08/12/2017 0000   HDL 35 08/12/2017 0000   CHOLHDL 4.3 04/13/2017 0547   VLDL 21 04/13/2017 0547   LDLCALC 73 08/12/2017 0000     Assessment & Plan:   1. Uncontrolled type 2 diabetes mellitus with complication, with CKD GFR of 49, CVA - -Recently he had developed CVA requiring rehabilitation and physical therapy. He remains at a high risk for more acute and chronic complications of diabetes which include CAD, CVA, CKD, retinopathy, and neuropathy.  These are all discussed in detail with the patient.  He returns with controlled glycemic profile both fasting and postprandial.  His previsit A1c was 6.9%, improving from 7.4%.        Glucose logs and insulin administration records pertaining to this visit,  to be scanned into patient's records.  Recent labs reviewed.   - I have re-counseled the patient on diet management   by adopting a carbohydrate restricted / protein rich  Diet.  - he  admits there is a room for improvement in his diet and drink choices. -  Suggestion is made for him to avoid simple carbohydrates  from his diet including Cakes, Sweet Desserts / Pastries, Ice Cream, Soda (diet and regular), Sweet Tea, Candies, Chips, Cookies, Sweet Pastries,  Store Bought Juices, Alcohol in Excess of  1-2 drinks a day, Artificial Sweeteners, Coffee Creamer, and "Sugar-free" Products. This will help patient to have stable blood glucose profile and potentially avoid unintended weight gain.   - Patient is advised to stick to a routine mealtimes to eat 3 meals  a day and avoid unnecessary snacks ( to snack only to correct hypoglycemia).  - I have approached patient with the following individualized plan to manage diabetes and patient agrees.  -He will continue to require intensive treatment with basal/bolus insulin in order for him to maintain control of diabetes to target. -He is advised to continue Lantus 34 units nightly, continue NovoLog 12 units  3 times a day before meals plus correction, associated with strict monitoring of blood glucose 4 times a day - before meals and at bedtime and as needed. - He would have benefited  from continuous glucose monitoring, however he decided not to get it because he cannot afford the monthly co-pay.    -He is encouraged to call this clinic for hypoglycemia or hyperglycemia above 200. - Adjustments for hypo-and hyperglycemia is given in written instructions to the patient.  -He is not a candidate for  metformin, SGLT2 inhibitors, nor incretin in therapy due to history of liver transplant.    2) BP/HTN: His blood pressure is not controlled to target.     He is advised to continue his current blood pressure medications including lisinopril 20 mg p.o. daily.     3) Lipids/HPL: His lipid panel is controlled with LDL at 73.  He is not on  statins as a result of his history of liver failure status post liver transplant.    4)  Weight/Diet:  exercise, and carbohydrates information provided.  5) Primary hypothyroidism:  He is advised to continue levothyroxine 137 mcg p.o. daily before breakfast.     - We discussed about the correct intake of his thyroid hormone, on empty stomach at fasting, with water, separated by at least 30 minutes from breakfast and other medications,  and separated by more than 4 hours from calcium, iron, multivitamins, acid reflux medications (PPIs). -Patient is made aware of the fact that thyroid hormone replacement is needed for life, dose to be adjusted by periodic monitoring of thyroid function tests.   5) Chronic Care/Health Maintenance:  -Patient is on ACEI/ARB  medications and encouraged to continue to follow up with Ophthalmology, Podiatrist at least yearly or according to recommendations, and advised to  stay away from smoking. I have recommended yearly flu vaccine and pneumonia vaccination at least every 5 years; and  sleep for at least 7 hours a day. -His foot exam is normal today.  - I advised patient to maintain close follow up with Kyle Sites, MD for primary care needs.  - Time spent on this patient care encounter:  35 min, of which > 50% was spent in  counseling and the rest reviewing his blood glucose logs , discussing his hypoglycemia and hyperglycemia episodes, reviewing his current and  previous labs / studies  ( including abstraction from other facilities) and medications  doses and developing a  long term treatment plan and documenting his care.    Please refer to Patient Instructions for Blood Glucose Monitoring and Insulin/Medications Dosing Guide"  in media tab for additional information. Please  also refer to " Patient Self Inventory" in the Media  tab for reviewed elements of pertinent patient history.  [name] participated in the discussions, expressed understanding, and voiced agreement with the above plans.  All questions were answered to his satisfaction. he is encouraged to contact clinic should he have any questions or concerns prior to his return visit.   follow up plan: -Return in about 4 months (around 03/13/2020), or labs at Hepatology, for Bring Meter and Logs- A1c in Office.  Glade Lloyd, MD Phone: [phone]  Fax: [phone]  -  This note was partially dictated with voice recognition software. Similar sounding words can be transcribed inadequately or may not  be corrected upon review.  11/11/2019, 9:35 AM

## 2019-11-11 NOTE — Patient Instructions (Signed)

## 2019-11-23 ENCOUNTER — Other Ambulatory Visit: Payer: Self-pay | Admitting: "Endocrinology

## 2020-02-08 DIAGNOSIS — Z944 Liver transplant status: Secondary | ICD-10-CM | POA: Diagnosis not present

## 2020-02-10 ENCOUNTER — Other Ambulatory Visit: Payer: Self-pay

## 2020-02-10 ENCOUNTER — Other Ambulatory Visit: Payer: Self-pay | Admitting: "Endocrinology

## 2020-02-10 ENCOUNTER — Encounter (HOSPITAL_COMMUNITY): Payer: Self-pay | Admitting: Emergency Medicine

## 2020-02-10 ENCOUNTER — Emergency Department (HOSPITAL_COMMUNITY): Payer: PPO

## 2020-02-10 ENCOUNTER — Emergency Department (HOSPITAL_COMMUNITY)
Admission: EM | Admit: 2020-02-10 | Discharge: 2020-02-10 | Disposition: A | Discharge status: Home / Self Care | Payer: PPO | Attending: Emergency Medicine | Admitting: Emergency Medicine

## 2020-02-10 DIAGNOSIS — Z794 Long term (current) use of insulin: Secondary | ICD-10-CM | POA: Insufficient documentation

## 2020-02-10 DIAGNOSIS — E039 Hypothyroidism, unspecified: Secondary | ICD-10-CM | POA: Diagnosis not present

## 2020-02-10 DIAGNOSIS — K529 Noninfective gastroenteritis and colitis, unspecified: Secondary | ICD-10-CM

## 2020-02-10 DIAGNOSIS — I129 Hypertensive chronic kidney disease with stage 1 through stage 4 chronic kidney disease, or unspecified chronic kidney disease: Secondary | ICD-10-CM | POA: Diagnosis not present

## 2020-02-10 DIAGNOSIS — R197 Diarrhea, unspecified: Secondary | ICD-10-CM | POA: Diagnosis not present

## 2020-02-10 DIAGNOSIS — Z8673 Personal history of transient ischemic attack (TIA), and cerebral infarction without residual deficits: Secondary | ICD-10-CM | POA: Diagnosis not present

## 2020-02-10 DIAGNOSIS — J9811 Atelectasis: Secondary | ICD-10-CM | POA: Diagnosis not present

## 2020-02-10 DIAGNOSIS — I10 Essential (primary) hypertension: Secondary | ICD-10-CM | POA: Diagnosis not present

## 2020-02-10 DIAGNOSIS — N183 Chronic kidney disease, stage 3 unspecified: Secondary | ICD-10-CM | POA: Diagnosis not present

## 2020-02-10 DIAGNOSIS — Z944 Liver transplant status: Secondary | ICD-10-CM | POA: Insufficient documentation

## 2020-02-10 DIAGNOSIS — R112 Nausea with vomiting, unspecified: Secondary | ICD-10-CM | POA: Diagnosis not present

## 2020-02-10 DIAGNOSIS — E1122 Type 2 diabetes mellitus with diabetic chronic kidney disease: Secondary | ICD-10-CM | POA: Diagnosis not present

## 2020-02-10 DIAGNOSIS — Z79899 Other long term (current) drug therapy: Secondary | ICD-10-CM | POA: Insufficient documentation

## 2020-02-10 DIAGNOSIS — Z20822 Contact with and (suspected) exposure to covid-19: Secondary | ICD-10-CM | POA: Diagnosis not present

## 2020-02-10 DIAGNOSIS — Z6837 Body mass index (BMI) 37.0-37.9, adult: Secondary | ICD-10-CM | POA: Insufficient documentation

## 2020-02-10 DIAGNOSIS — Z79890 Hormone replacement therapy (postmenopausal): Secondary | ICD-10-CM | POA: Insufficient documentation

## 2020-02-10 DIAGNOSIS — Z7982 Long term (current) use of aspirin: Secondary | ICD-10-CM | POA: Diagnosis not present

## 2020-02-10 LAB — CBG MONITORING, ED: Glucose-Capillary: 149 mg/dL — ABNORMAL HIGH (ref 70–99)

## 2020-02-10 LAB — COMPREHENSIVE METABOLIC PANEL
ALT: 18 U/L (ref 0–44)
AST: 21 U/L (ref 15–41)
Albumin: 3.5 g/dL (ref 3.5–5.0)
Alkaline Phosphatase: 58 U/L (ref 38–126)
Anion gap: 10 (ref 5–15)
BUN: 29 mg/dL — ABNORMAL HIGH (ref 8–23)
CO2: 24 mmol/L (ref 22–32)
Calcium: 8.8 mg/dL — ABNORMAL LOW (ref 8.9–10.3)
Chloride: 108 mmol/L (ref 98–111)
Creatinine, Ser: 1.3 mg/dL — ABNORMAL HIGH (ref 0.61–1.24)
GFR calc Af Amer: >60 mL/min (ref >60)
GFR calc non Af Amer: 52 mL/min — ABNORMAL LOW (ref >60)
Glucose, Bld: 165 mg/dL — ABNORMAL HIGH (ref 70–99)
Potassium: 3.8 mmol/L (ref 3.5–5.1)
Sodium: 142 mmol/L (ref 135–145)
Total Bilirubin: 0.9 mg/dL (ref 0.3–1.2)
Total Protein: 7 g/dL (ref 6.5–8.1)

## 2020-02-10 LAB — CBC WITH DIFFERENTIAL/PLATELET
Abs Immature Granulocytes: 0.05 10*3/uL (ref 0.00–0.07)
Basophils Absolute: 0.1 10*3/uL (ref 0.0–0.1)
Basophils Relative: 1 %
Eosinophils Absolute: 0.3 10*3/uL (ref 0.0–0.5)
Eosinophils Relative: 3 %
HCT: 43.7 % (ref 39.0–52.0)
Hemoglobin: 14 g/dL (ref 13.0–17.0)
Immature Granulocytes: 1 %
Lymphocytes Relative: 16 %
Lymphs Abs: 1.3 10*3/uL (ref 0.7–4.0)
MCH: 32.5 pg (ref 26.0–34.0)
MCHC: 32 g/dL (ref 30.0–36.0)
MCV: 101.4 fL — ABNORMAL HIGH (ref 80.0–100.0)
Monocytes Absolute: 0.6 10*3/uL (ref 0.1–1.0)
Monocytes Relative: 7 %
Neutro Abs: 6 10*3/uL (ref 1.7–7.7)
Neutrophils Relative %: 72 %
Platelets: 191 10*3/uL (ref 150–400)
RBC: 4.31 MIL/uL (ref 4.22–5.81)
RDW: 13.2 % (ref 11.5–15.5)
WBC: 8.3 10*3/uL (ref 4.0–10.5)
nRBC: 0 % (ref 0.0–0.2)

## 2020-02-10 LAB — BRAIN NATRIURETIC PEPTIDE: B Natriuretic Peptide: 67 pg/mL (ref 0.0–100.0)

## 2020-02-10 LAB — TROPONIN I (HIGH SENSITIVITY): Troponin I (High Sensitivity): 30 ng/L — ABNORMAL HIGH (ref <18)

## 2020-02-10 LAB — SARS CORONAVIRUS 2 BY RT PCR (HOSPITAL ORDER, PERFORMED IN ~~LOC~~ HOSPITAL LAB): SARS Coronavirus 2: NEGATIVE

## 2020-02-10 MED ORDER — ONDANSETRON HCL 4 MG/2ML IJ SOLN
4.0000 mg | Freq: Once | INTRAMUSCULAR | Status: AC
  Administered 2020-02-10: 4 mg via INTRAVENOUS
  Filled 2020-02-10: qty 2

## 2020-02-10 MED ORDER — ONDANSETRON 8 MG PO TBDP
ORAL_TABLET | ORAL | 0 refills | Status: DC
Start: 2020-02-10 — End: 2020-03-16

## 2020-02-10 MED ORDER — SODIUM CHLORIDE 0.9 % IV BOLUS
1000.0000 mL | Freq: Once | INTRAVENOUS | Status: AC
  Administered 2020-02-10: 1000 mL via INTRAVENOUS

## 2020-02-10 NOTE — ED Triage Notes (Signed)
Pt arrives via POV w/complaints of NVD since Monday. 1 episode of emesis during triage.  Pt states son in law has covid but pt denies contact.

## 2020-02-10 NOTE — ED Notes (Signed)
pts O2 fell to 88% on RA, pt placed on 2L, MD made aware

## 2020-02-10 NOTE — ED Provider Notes (Signed)
[facility] EMERGENCY DEPARTMENT Provider Note   CSN: [id number] Arrival date & time: 02/10/20  0406     History Chief Complaint  Patient presents with  . Nausea    [name] is a 79 y.o. male.  Patient is a 79 year old male with past medical history of liver transplant, diabetes, chronic renal insufficiency, esophageal varices.  He presents today for evaluation of nausea, vomiting, and diarrhea.  This has been present for the past 2 days.  Patient denies to me he is experiencing any significant abdominal pain.  He denies fevers or chills.  He denies known Covid exposures.  He has had both doses of his vaccine.  The history is provided by the patient.       Past Medical History:  Diagnosis Date  . Anemia   . Biliary calculi, common bile duct   . CRI (chronic renal insufficiency)   . DM (diabetes mellitus) ([place])    Type II  . Elevated liver enzymes   . Esophageal varices ([place]) 03/19/11   mrcp  . History of ERCP 03/21/2011   Extrahepatic biliary obstruction secondary to occluded biliary stent and colon no cholelithiasis, status post sphincterotomy, stent removal and replacement and stone extraction.  Marland Kitchen HTN (hypertension)   . Hypothyroidism   . NASH (nonalcoholic steatohepatitis) 2006   liver transplant  . Pancreatitis chronic 03/19/11   mrcp  . S/P colonoscopy with polypectomy 2004   Dr Gala Romney  . S/P endoscopy 03/2004   Hx esophgaeal varices, pyloric channel ulcer prior to transplant  . Squamous cell cancer of external ear    pinna    Patient Active Problem List   Diagnosis Date Noted  . Stroke (cerebrum) ([place]) 04/12/2017  . Left leg weakness 04/12/2017  . CVA (cerebral vascular accident) ([place]) 04/12/2017  . Class 2 severe obesity due to excess calories with serious comorbidity and body mass index (BMI) of 37.0 to 37.9 in adult [facility]) 06/29/2016  . Type 2 diabetes mellitus with stage 3 chronic kidney disease, with long-term current use of insulin ([place]) 06/15/2015   . Mixed hyperlipidemia 06/15/2015  . Essential hypertension, benign 06/15/2015  . Medial meniscus tear 11/24/2012  . Lateral meniscal tear 11/24/2012  . OA (osteoarthritis) of knee 02/28/2012  . Obstructive hyperbilirubinemia 03/27/2011  . Papular urticaria 03/18/2011  . LFTs abnormal 03/17/2011  . Liver replaced by transplant ([place]) 01/05/2009  . GOUT, UNSPECIFIED 01/04/2009  . RADIATION THERAPY, HX OF 01/04/2009  . Hypothyroidism 12/31/2008  . ANEMIA, NORMOCYTIC 12/31/2008  . LIVER FAILURE, ACUTE 12/31/2008  . Other chronic nonalcoholic liver disease 33/00/7622  . CKD (chronic kidney disease), stage II 12/31/2008    Past Surgical History:  Procedure Laterality Date  . BILE DUCT STENT PLACEMENT  03/21/11   Dr. Gala Romney  . CHOLECYSTECTOMY  2006  . COLONOSCOPY  08/12/2002   Dr. Gala Romney- somewhat diffusely friable rectum and colon, polyp at 30 cm.  . COLONOSCOPY N/A 09/08/2012   Procedure: COLONOSCOPY;  Surgeon: Daneil Dolin, MD;  Location: [facility] ENDO SUITE;  Service: Endoscopy;  Laterality: N/A;  10:30  . ERCP  03/21/2011   Dr. Gala Romney- ERCP with removal of pediatric feeding tube, endoscopic sphinecterotomy with balloon dilation of ampulla followed by stone extraction and stent placement  . ESOPHAGOGASTRODUODENOSCOPY  03/20/2004   Dr. Nash Mantis 2 esophageal varices o/w normal esophageal mucosa, mild changes of snake skin in the gastric mucosa consistent with portal gastropathy, multiple antral erosions  and  a single pyloric channel ulcer benign appearance o/w normal  gastric mucosa, normal D1 and S2  . KNEE ARTHROSCOPY WITH EXCISION PLICA Left 09/13/6576   Procedure: KNEE ARTHROSCOPY WITH EXCISION PLICA;  Surgeon: Carole Civil, MD;  Location: [facility] ORS;  Service: Orthopedics;  Laterality: Left;  . KNEE ARTHROSCOPY WITH MEDIAL MENISECTOMY Left 11/21/2012   Procedure: KNEE ARTHROSCOPY WITH MEDIAL AND LATERAL MENISECTOMY;  Surgeon: Carole Civil, MD;  Location: [facility] ORS;  Service: Orthopedics;   Laterality: Left;  . LIVER TRANSPLANT  2006   [facility]-NASH cirrhosis  . MOUTH SURGERY  may 2012  . MRCP  03/19/11   biliary ductal dilatation- stones. esophageal varices  . SPHINCTEROTOMY  03/21/2011   Procedure: SPHINCTEROTOMY;  Surgeon: Daneil Dolin, MD;  Location: [facility] ORS;  Service: Gastroenterology;;       Family History  Problem Relation Age of Onset  . Cirrhosis Father        nash  . Lymphoma Mother   . Diabetes Daughter     Social History   Tobacco Use  . Smoking status: Never Smoker  . Smokeless tobacco: Never Used  Vaping Use  . Vaping Use: Never used  Substance Use Topics  . Alcohol use: No  . Drug use: No    Home Medications Prior to Admission medications   Medication Sig Start Date End Date Taking? Authorizing Provider  aspirin 325 MG tablet Take 1 tablet (325 mg total) by mouth daily. 04/16/17   Oswald Hillock, MD  docusate sodium (COLACE) 100 MG capsule Take 100 mg by mouth at bedtime.    [provider]  insulin aspart (NOVOLOG FLEXPEN) 100 UNIT/ML FlexPen Inject 12-18 Units into the skin 3 (three) times daily with meals. Give per sliding scale three times a day 07/13/19   Cassandria Anger, MD  Insulin Glargine (LANTUS SOLOSTAR) 100 UNIT/ML Solostar Pen Inject 34 Units into the skin daily at 10 pm. 07/13/19   Nida, Marella Chimes, MD  levothyroxine (SYNTHROID) 137 MCG tablet Take 1 tablet (137 mcg total) by mouth daily before breakfast. 07/13/19   Nida, Marella Chimes, MD  Multiple Vitamins-Minerals (MULTIVITAMIN WITH MINERALS) tablet Take 1 tablet by mouth daily.      [provider]  olmesartan (BENICAR) 40 MG tablet Take 40 mg by mouth daily. 10/23/19   [provider]  ONETOUCH ULTRA test strip TESTING FOUR TIMES DAILY. 11/23/19   Cassandria Anger, MD  oxyCODONE-acetaminophen (PERCOCET/ROXICET) 5-325 MG tablet Take 1 tablet by mouth every 4 (four) hours as needed. 04/16/17   Oswald Hillock, MD  Specialty Vitamins Products  (MAGNESIUM, AMINO ACID CHELATE,) 133 MG tablet Take 1 tablet by mouth 2 (two) times daily. Magnesium 133mg      [provider]  tacrolimus (PROGRAF) 1 MG capsule Take 0.5 mg by mouth 2 (two) times daily.     [provider]    Allergies    Contrast media [iodinated diagnostic agents]  Review of Systems   Review of Systems  All other systems reviewed and are negative.   Physical Exam Updated Vital Signs BP (!) 170/101 (BP Location: Left Arm)   Pulse 72   Temp 98.7 F (37.1 C) (Oral)   Resp 17   Ht 5' 9.5" (1.765 m)   Wt 117.9 kg   SpO2 93%   BMI 37.84 kg/m   Physical Exam Vitals and nursing note reviewed.  Constitutional:      General: He is not in acute distress.    Appearance: He is well-developed. He is not diaphoretic.  HENT:  Head: Normocephalic and atraumatic.  Cardiovascular:     Rate and Rhythm: Normal rate and regular rhythm.     Heart sounds: No murmur heard.  No friction rub.  Pulmonary:     Effort: Pulmonary effort is normal. No respiratory distress.     Breath sounds: Normal breath sounds. No wheezing or rales.  Abdominal:     General: Bowel sounds are normal. There is no distension.     Palpations: Abdomen is soft.     Tenderness: There is no abdominal tenderness.  Musculoskeletal:        General: Normal range of motion.     Cervical back: Normal range of motion and neck supple.  Skin:    General: Skin is warm and dry.  Neurological:     Mental Status: He is alert and oriented to person, place, and time.     Coordination: Coordination normal.     ED Results / Procedures / Treatments   Labs (all labs ordered are listed, but only abnormal results are displayed) Labs Reviewed  SARS CORONAVIRUS 2 BY RT PCR ([place] [place] [facility])  COMPREHENSIVE METABOLIC PANEL  BRAIN NATRIURETIC PEPTIDE  CBC WITH DIFFERENTIAL/PLATELET  TROPONIN I (HIGH SENSITIVITY)    EKG None  Radiology No results  found.  Procedures Procedures (including critical care time)  Medications Ordered in ED Medications  sodium chloride 0.9 % bolus 1,000 mL (has no administration in time range)  ondansetron (ZOFRAN) injection 4 mg (has no administration in time range)    ED Course  I have reviewed the triage vital signs and the nursing notes.  Pertinent labs & imaging results that were available during my care of the patient were reviewed by me and considered in my medical decision making (see chart for details).    MDM Rules/Calculators/A&P  Patient presenting here with complaints of nausea and vomiting.  He has a history of liver transplant.  Patient's work-up today shows basically unremarkable laboratory studies.  He has no leukocytosis, benign abdomen, negative chest x-ray, and seems to be feeling better after fluids and Zofran.  He has had no further vomiting here in the ER.  His Covid test is negative.  At this point, I feel as though discharge is appropriate.  He will be prescribed Zofran and advised to follow-up with primary doctor.  He is to return to the ER if symptoms worsen or change.  Final Clinical Impression(s) / ED Diagnoses Final diagnoses:  None    Rx / DC Orders ED Discharge Orders    None       Veryl Speak, MD 02/10/20 [phone]

## 2020-02-10 NOTE — Discharge Instructions (Signed)
Zofran as prescribed as needed for nausea.  Clear liquid diet for the next 12 hours, then slowly advance to normal as tolerated.  Return to the emergency department if you develop severe abdominal pain, bloody stool or vomit, high fever, or other new and concerning symptoms.

## 2020-02-11 NOTE — Progress Notes (Signed)
Primary Care Physician:  Sharilyn Sites, MD Primary Gastroenterologist:  Dr. Gala Romney  Chief Complaint  Patient presents with  . Nausea    with vomiting. Can't keep anything down. He stopped taking all meds 02/09/20. Recent ED visit  . Diarrhea    runny, comes/goes    HPI:   [name] is a 79 y.o. male presenting today for vomiting and diarrhea.    History of orthotopic liver transplantation for Nash/cirrhosis in 2006. History of choledocholithiasis in 2012 s/p ERCP with sphincterotomy and stent biliary stent replacement. Last saw [facility] for transplant follow-up in April 2021 and was doing very well with plans to follow-up in 1 year.  He is up-to-date for colon cancer screening with last colonoscopy in 2014 revealing colonic diverticulosis with recommendations to repeat in 10 years.  Patient was seen in the emergency room at [facility] 02/10/2020 for evaluation of nausea, vomiting, and diarrhea that had been present for 2 days.  Denied abdominal pain, fever, or chills.  CBC essentially normal.  CMP remarkable for creatinine 1.3 and glucose 165.  COVID test negative.  Chest x-ray negative.  Benign abdomen.  He was feeling better after fluids and Zofran.  He was diagnosed with acute gastroenteritis and prescribed Zofran at discharge.  Advised to follow-up with PCP.  Today: Since Monday, patient has not been able to keep anything down including fluids, liquids, or medications.  Symptoms started after swallowing a hot dog on Monday which he felt got stuck in his esophagus.  He presented to the ED on 8/4.  States he coughed some of the hot dog up in the emergency room and started to feel somewhat better.  He did not relay the information to the emergency room provider that he felt he had something hung in his esophagus.  Since Monday, he has been spitting up anything he takes by mouth within 2-3 minutes.  No hematemesis.  No significant nausea.  No abdominal pain.  Prior to onset, he reports a  couple episodes of feeling foods getting hung in his esophagus over the last few weeks.  Denies any history of GERD.  Has providers through his insurance company that will come to his house.  PA came out to his home yesterday.  He received a bad of fluids yesterday as he was told he was dehydrated.  States his urine is darker than it normally is.  In addition to not taking any p.o. medications, he is not taking his insulin due to inability to eat.  He is checking his blood sugars.  This morning, blood sugar was around 170.  Intermittent diarrhea. Started Monday. He had a loose BM x 1 after eating a lot of tomatoes. Then no BM Tuesday and Wednesday. Loose stool yesterday and this morning. Stools are mushy not watery. Typically having 1-2 BMs a day. No blood in the stool or black stool. Prior to Monday, he had formed BMs.   Past Medical History:  Diagnosis Date  . Anemia   . Biliary calculi, common bile duct   . CRI (chronic renal insufficiency)   . DM (diabetes mellitus) ([place])    Type II  . Elevated liver enzymes   . Esophageal varices ([place]) 03/19/11   mrcp  . History of ERCP 03/21/2011   Extrahepatic biliary obstruction secondary to occluded biliary stent and colon no cholelithiasis, status post sphincterotomy, stent removal and replacement and stone extraction.  Marland Kitchen HTN (hypertension)   . Hypothyroidism   . NASH (nonalcoholic steatohepatitis)  2006   liver transplant  . Pancreatitis chronic 03/19/11   mrcp  . S/P colonoscopy with polypectomy 2004   Dr Gala Romney  . S/P endoscopy 03/2004   Hx esophgaeal varices, pyloric channel ulcer prior to transplant  . Squamous cell cancer of external ear    pinna    Past Surgical History:  Procedure Laterality Date  . BILE DUCT STENT PLACEMENT  03/21/11   Dr. Gala Romney  . CHOLECYSTECTOMY  2006  . COLONOSCOPY  08/12/2002   Dr. Gala Romney- somewhat diffusely friable rectum and colon, polyp at 30 cm.  . COLONOSCOPY N/A 09/08/2012   Procedure: COLONOSCOPY;  Surgeon:  Daneil Dolin, MD;  colonic diverticulosis. Repeat in 10 years.   Marland Kitchen ERCP  03/21/2011   Dr. Gala Romney- ERCP with removal of pediatric feeding tube, endoscopic sphinecterotomy with balloon dilation of ampulla followed by stone extraction and stent placement  . ESOPHAGOGASTRODUODENOSCOPY  03/20/2004   Dr. [place] Mantis 2 esophageal varices o/w normal esophageal mucosa, mild changes of snake skin in the gastric mucosa consistent with portal gastropathy, multiple antral erosions  and  a single pyloric channel ulcer benign appearance o/w normal gastric mucosa, normal D1 and S2  . KNEE ARTHROSCOPY WITH EXCISION PLICA Left 0/62/6948   Procedure: KNEE ARTHROSCOPY WITH EXCISION PLICA;  Surgeon: Carole Civil, MD;  Location: [facility] ORS;  Service: Orthopedics;  Laterality: Left;  . KNEE ARTHROSCOPY WITH MEDIAL MENISECTOMY Left 11/21/2012   Procedure: KNEE ARTHROSCOPY WITH MEDIAL AND LATERAL MENISECTOMY;  Surgeon: Carole Civil, MD;  Location: [facility] ORS;  Service: Orthopedics;  Laterality: Left;  . LIVER TRANSPLANT  2006   [facility]A-NASH cirrhosis  . MOUTH SURGERY  may 2012  . MRCP  03/19/11   biliary ductal dilatation- stones. esophageal varices  . SPHINCTEROTOMY  03/21/2011   Procedure: SPHINCTEROTOMY;  Surgeon: Daneil Dolin, MD;  Location: [facility] ORS;  Service: Gastroenterology;;    Current Outpatient Medications  Medication Sig Dispense Refill  . aspirin 325 MG tablet Take 1 tablet (325 mg total) by mouth daily. (Patient not taking: Reported on 02/12/2020) 30 tablet 2  . docusate sodium (COLACE) 100 MG capsule Take 100 mg by mouth at bedtime. (Patient not taking: Reported on 02/12/2020)    . insulin aspart (NOVOLOG FLEXPEN) 100 UNIT/ML FlexPen Inject 12-18 Units into the skin 3 (three) times daily with meals. Give per sliding scale three times a day (Patient not taking: Reported on 02/12/2020) 10 pen 1  . Insulin Glargine (LANTUS SOLOSTAR) 100 UNIT/ML Solostar Pen Inject 34 Units into the skin daily at 10 pm. (Patient not  taking: Reported on 02/12/2020) 45 mL 1  . levothyroxine (SYNTHROID) 137 MCG tablet Take 1 tablet (137 mcg total) by mouth daily before breakfast. (Patient not taking: Reported on 02/12/2020) 30 tablet 6  . Multiple Vitamins-Minerals (MULTIVITAMIN WITH MINERALS) tablet Take 1 tablet by mouth daily.   (Patient not taking: Reported on 02/12/2020)    . olmesartan (BENICAR) 40 MG tablet Take 40 mg by mouth daily. (Patient not taking: Reported on 02/12/2020)    . ondansetron (ZOFRAN ODT) 8 MG disintegrating tablet 8mg  ODT q4 hours prn nausea (Patient not taking: Reported on 02/12/2020) 6 tablet 0  . ONETOUCH ULTRA test strip TESTING FOUR TIMES DAILY. (Patient not taking: Reported on 02/12/2020) 300 each 0  . oxyCODONE-acetaminophen (PERCOCET/ROXICET) 5-325 MG tablet Take 1 tablet by mouth every 4 (four) hours as needed. (Patient not taking: Reported on 02/12/2020) 10 tablet 0  . Specialty Vitamins Products (MAGNESIUM, AMINO ACID CHELATE,) 133  MG tablet Take 1 tablet by mouth 2 (two) times daily. Magnesium 133mg   (Patient not taking: Reported on 02/12/2020)    . tacrolimus (PROGRAF) 1 MG capsule Take 0.5 mg by mouth 2 (two) times daily.  (Patient not taking: Reported on 02/12/2020)     No current facility-administered medications for this visit.    Allergies as of 02/12/2020 - Review Complete 02/12/2020  Allergen Reaction Noted  . Contrast media [iodinated diagnostic agents] Rash 03/27/2011    Family History  Problem Relation Age of Onset  . Cirrhosis Father        nash  . Lymphoma Mother   . Diabetes Daughter     Social History   Socioeconomic History  . Marital status: Married    Spouse name: Not on file  . Number of children: 1  . Years of education: 27  . Highest education level: Not on file  Occupational History  . Occupation: RETIRED    Employer: RETIRED  Tobacco Use  . Smoking status: Never Smoker  . Smokeless tobacco: Never Used  Vaping Use  . Vaping Use: Never used  Substance and Sexual  Activity  . Alcohol use: No  . Drug use: No  . Sexual activity: Not on file  Other Topics Concern  . Not on file  Social History Narrative  . Not on file   Social Determinants of Health   Financial Resource Strain:   . Difficulty of Paying Living Expenses:   Food Insecurity:   . Worried About Charity fundraiser in the Last Year:   . Arboriculturist in the Last Year:   Transportation Needs:   . Film/video editor (Medical):   Marland Kitchen Lack of Transportation (Non-Medical):   Physical Activity:   . Days of Exercise per Week:   . Minutes of Exercise per Session:   Stress:   . Feeling of Stress :   Social Connections:   . Frequency of Communication with Friends and Family:   . Frequency of Social Gatherings with Friends and Family:   . Attends Religious Services:   . Active Member of Clubs or Organizations:   . Attends Archivist Meetings:   Marland Kitchen Marital Status:   Intimate Partner Violence:   . Fear of Current or Ex-Partner:   . Emotionally Abused:   Marland Kitchen Physically Abused:   . Sexually Abused:     Review of Systems: Gen: Denies any fever, chills, cold or flu like symptoms, lightheadedness, dizziness, presyncope, syncope. CV: Denies chest pain or heart palpitations Resp: Chronic SOB with exertion. No SOB at rest. Chronic intermittent cough.  GI: See HPI GU : See HPI MS: Chronic knee and back pain.  Derm: Denies rash Heme: See HPI  Physical Exam: BP (!) 152/87   Pulse 90   Temp (!) 97 F (36.1 C)   Ht 5' 9.5" (1.765 m)   Wt 253 lb (114.8 kg)   BMI 36.83 kg/m  General:   Alert and oriented. Pleasant and cooperative. Well-nourished and well-developed.  Head:  Normocephalic and atraumatic. Eyes:  Without icterus, sclera clear and conjunctiva pink.  Ears:  Normal auditory acuity. Lungs:  Clear to auscultation bilaterally. No wheezes, rales, or rhonchi. No distress.  Heart:  S1, S2 present without murmurs appreciated.  Abdomen:  +BS, soft, non-tender and  non-distended. No HSM noted. No guarding or rebound. No masses appreciated. RUQ scar.  Rectal:  Deferred  Msk:  Symmetrical without gross deformities. Normal posture. Extremities:  Without  edema. Neurologic:  Alert and  oriented x4;  grossly normal neurologically. Skin:  Intact without significant lesions or rashes. Psych: Normal mood and affect.

## 2020-02-12 ENCOUNTER — Encounter (HOSPITAL_COMMUNITY)
Admission: EM | Disposition: A | Discharge status: Home / Self Care | Payer: Self-pay | Source: Home / Self Care | Attending: Emergency Medicine

## 2020-02-12 ENCOUNTER — Emergency Department (HOSPITAL_COMMUNITY): Payer: PPO | Admitting: Anesthesiology

## 2020-02-12 ENCOUNTER — Emergency Department (HOSPITAL_COMMUNITY): Payer: PPO

## 2020-02-12 ENCOUNTER — Ambulatory Visit (HOSPITAL_COMMUNITY)
Admission: EM | Admit: 2020-02-12 | Discharge: 2020-02-12 | Disposition: A | Discharge status: Home / Self Care | Payer: PPO | Source: Home / Self Care | Attending: Emergency Medicine | Admitting: Emergency Medicine

## 2020-02-12 ENCOUNTER — Telehealth (INDEPENDENT_AMBULATORY_CARE_PROVIDER_SITE_OTHER): Payer: Self-pay | Admitting: Gastroenterology

## 2020-02-12 ENCOUNTER — Encounter: Payer: Self-pay | Admitting: Gastroenterology

## 2020-02-12 ENCOUNTER — Other Ambulatory Visit: Payer: Self-pay

## 2020-02-12 ENCOUNTER — Ambulatory Visit (INDEPENDENT_AMBULATORY_CARE_PROVIDER_SITE_OTHER): Payer: PPO | Admitting: Gastroenterology

## 2020-02-12 ENCOUNTER — Observation Stay (HOSPITAL_COMMUNITY)
Admission: EM | Admit: 2020-02-12 | Discharge: 2020-02-13 | Disposition: A | Discharge status: Home / Self Care | Payer: PPO | Attending: Internal Medicine | Admitting: Internal Medicine

## 2020-02-12 ENCOUNTER — Encounter (HOSPITAL_COMMUNITY): Payer: Self-pay | Admitting: Emergency Medicine

## 2020-02-12 DIAGNOSIS — T18128A Food in esophagus causing other injury, initial encounter: Secondary | ICD-10-CM

## 2020-02-12 DIAGNOSIS — R1310 Dysphagia, unspecified: Secondary | ICD-10-CM

## 2020-02-12 DIAGNOSIS — I129 Hypertensive chronic kidney disease with stage 1 through stage 4 chronic kidney disease, or unspecified chronic kidney disease: Secondary | ICD-10-CM | POA: Insufficient documentation

## 2020-02-12 DIAGNOSIS — I10 Essential (primary) hypertension: Secondary | ICD-10-CM | POA: Diagnosis present

## 2020-02-12 DIAGNOSIS — R1110 Vomiting, unspecified: Secondary | ICD-10-CM | POA: Diagnosis not present

## 2020-02-12 DIAGNOSIS — R0902 Hypoxemia: Secondary | ICD-10-CM | POA: Diagnosis present

## 2020-02-12 DIAGNOSIS — X58XXXA Exposure to other specified factors, initial encounter: Secondary | ICD-10-CM | POA: Insufficient documentation

## 2020-02-12 DIAGNOSIS — K209 Esophagitis, unspecified without bleeding: Secondary | ICD-10-CM | POA: Diagnosis not present

## 2020-02-12 DIAGNOSIS — J9811 Atelectasis: Secondary | ICD-10-CM | POA: Diagnosis not present

## 2020-02-12 DIAGNOSIS — Z794 Long term (current) use of insulin: Secondary | ICD-10-CM | POA: Insufficient documentation

## 2020-02-12 DIAGNOSIS — Z20822 Contact with and (suspected) exposure to covid-19: Secondary | ICD-10-CM | POA: Insufficient documentation

## 2020-02-12 DIAGNOSIS — Z7982 Long term (current) use of aspirin: Secondary | ICD-10-CM | POA: Insufficient documentation

## 2020-02-12 DIAGNOSIS — N183 Chronic kidney disease, stage 3 unspecified: Secondary | ICD-10-CM | POA: Insufficient documentation

## 2020-02-12 DIAGNOSIS — Z79890 Hormone replacement therapy (postmenopausal): Secondary | ICD-10-CM | POA: Insufficient documentation

## 2020-02-12 DIAGNOSIS — E039 Hypothyroidism, unspecified: Secondary | ICD-10-CM | POA: Insufficient documentation

## 2020-02-12 DIAGNOSIS — Z944 Liver transplant status: Secondary | ICD-10-CM | POA: Insufficient documentation

## 2020-02-12 DIAGNOSIS — Z6837 Body mass index (BMI) 37.0-37.9, adult: Secondary | ICD-10-CM | POA: Insufficient documentation

## 2020-02-12 DIAGNOSIS — Z85828 Personal history of other malignant neoplasm of skin: Secondary | ICD-10-CM | POA: Insufficient documentation

## 2020-02-12 DIAGNOSIS — E1122 Type 2 diabetes mellitus with diabetic chronic kidney disease: Secondary | ICD-10-CM | POA: Insufficient documentation

## 2020-02-12 DIAGNOSIS — Z79899 Other long term (current) drug therapy: Secondary | ICD-10-CM | POA: Insufficient documentation

## 2020-02-12 DIAGNOSIS — Z923 Personal history of irradiation: Secondary | ICD-10-CM | POA: Insufficient documentation

## 2020-02-12 DIAGNOSIS — E782 Mixed hyperlipidemia: Secondary | ICD-10-CM | POA: Diagnosis present

## 2020-02-12 DIAGNOSIS — K2210 Ulcer of esophagus without bleeding: Secondary | ICD-10-CM | POA: Insufficient documentation

## 2020-02-12 DIAGNOSIS — K228 Other specified diseases of esophagus: Secondary | ICD-10-CM | POA: Diagnosis not present

## 2020-02-12 DIAGNOSIS — R195 Other fecal abnormalities: Secondary | ICD-10-CM

## 2020-02-12 DIAGNOSIS — N189 Chronic kidney disease, unspecified: Secondary | ICD-10-CM | POA: Diagnosis not present

## 2020-02-12 DIAGNOSIS — T18108A Unspecified foreign body in esophagus causing other injury, initial encounter: Secondary | ICD-10-CM

## 2020-02-12 HISTORY — PX: BIOPSY: SHX5522

## 2020-02-12 HISTORY — PX: ESOPHAGOGASTRODUODENOSCOPY (EGD) WITH PROPOFOL: SHX5813

## 2020-02-12 LAB — BASIC METABOLIC PANEL
Anion gap: 7 (ref 5–15)
BUN: 41 mg/dL — ABNORMAL HIGH (ref 8–23)
CO2: 27 mmol/L (ref 22–32)
Calcium: 8.7 mg/dL — ABNORMAL LOW (ref 8.9–10.3)
Chloride: 107 mmol/L (ref 98–111)
Creatinine, Ser: 1.44 mg/dL — ABNORMAL HIGH (ref 0.61–1.24)
GFR calc Af Amer: 54 mL/min — ABNORMAL LOW (ref >60)
GFR calc non Af Amer: 46 mL/min — ABNORMAL LOW (ref >60)
Glucose, Bld: 174 mg/dL — ABNORMAL HIGH (ref 70–99)
Potassium: 4 mmol/L (ref 3.5–5.1)
Sodium: 141 mmol/L (ref 135–145)

## 2020-02-12 LAB — CBC WITH DIFFERENTIAL/PLATELET
Abs Immature Granulocytes: 0.03 10*3/uL (ref 0.00–0.07)
Basophils Absolute: 0.1 10*3/uL (ref 0.0–0.1)
Basophils Relative: 1 %
Eosinophils Absolute: 0.2 10*3/uL (ref 0.0–0.5)
Eosinophils Relative: 2 %
HCT: 42.7 % (ref 39.0–52.0)
Hemoglobin: 13.2 g/dL (ref 13.0–17.0)
Immature Granulocytes: 0 %
Lymphocytes Relative: 15 %
Lymphs Abs: 1.5 10*3/uL (ref 0.7–4.0)
MCH: 32.2 pg (ref 26.0–34.0)
MCHC: 30.9 g/dL (ref 30.0–36.0)
MCV: 104.1 fL — ABNORMAL HIGH (ref 80.0–100.0)
Monocytes Absolute: 0.6 10*3/uL (ref 0.1–1.0)
Monocytes Relative: 6 %
Neutro Abs: 7.7 10*3/uL (ref 1.7–7.7)
Neutrophils Relative %: 76 %
Platelets: 193 10*3/uL (ref 150–400)
RBC: 4.1 MIL/uL — ABNORMAL LOW (ref 4.22–5.81)
RDW: 13.4 % (ref 11.5–15.5)
WBC: 10 10*3/uL (ref 4.0–10.5)
nRBC: 0 % (ref 0.0–0.2)

## 2020-02-12 LAB — CBG MONITORING, ED
Glucose-Capillary: 156 mg/dL — ABNORMAL HIGH (ref 70–99)
Glucose-Capillary: 157 mg/dL — ABNORMAL HIGH (ref 70–99)

## 2020-02-12 LAB — SARS CORONAVIRUS 2 BY RT PCR (HOSPITAL ORDER, PERFORMED IN ~~LOC~~ HOSPITAL LAB): SARS Coronavirus 2: NEGATIVE

## 2020-02-12 LAB — GLUCOSE, CAPILLARY: Glucose-Capillary: 110 mg/dL — ABNORMAL HIGH (ref 70–99)

## 2020-02-12 SURGERY — ESOPHAGOGASTRODUODENOSCOPY (EGD) WITH PROPOFOL
Anesthesia: General

## 2020-02-12 MED ORDER — GLYCOPYRROLATE PF 0.2 MG/ML IJ SOSY
PREFILLED_SYRINGE | INTRAMUSCULAR | Status: DC | PRN
  Administered 2020-02-12: .1 mg via INTRAVENOUS

## 2020-02-12 MED ORDER — SUCCINYLCHOLINE CHLORIDE 200 MG/10ML IV SOSY
PREFILLED_SYRINGE | INTRAVENOUS | Status: DC | PRN
  Administered 2020-02-12: 140 mg via INTRAVENOUS
  Administered 2020-02-12: 40 mg via INTRAVENOUS

## 2020-02-12 MED ORDER — OMEPRAZOLE 40 MG PO CPDR
40.0000 mg | DELAYED_RELEASE_CAPSULE | Freq: Two times a day (BID) | ORAL | 3 refills | Status: DC
Start: 2020-02-12 — End: 2020-02-13

## 2020-02-12 MED ORDER — LIDOCAINE HCL (CARDIAC) PF 100 MG/5ML IV SOSY
PREFILLED_SYRINGE | INTRAVENOUS | Status: DC | PRN
Start: 2020-02-12 — End: 2020-02-12
  Administered 2020-02-12: 100 mg via INTRAVENOUS

## 2020-02-12 MED ORDER — PHENYLEPHRINE 40 MCG/ML (10ML) SYRINGE FOR IV PUSH (FOR BLOOD PRESSURE SUPPORT)
PREFILLED_SYRINGE | INTRAVENOUS | Status: DC | PRN
  Administered 2020-02-12 (×6): 80 ug via INTRAVENOUS

## 2020-02-12 MED ORDER — LACTATED RINGERS IV BOLUS
1000.0000 mL | Freq: Once | INTRAVENOUS | Status: AC
  Administered 2020-02-12: 1000 mL via INTRAVENOUS

## 2020-02-12 MED ORDER — ROCURONIUM BROMIDE 10 MG/ML (PF) SYRINGE
PREFILLED_SYRINGE | INTRAVENOUS | Status: DC | PRN
  Administered 2020-02-12: 30 mg via INTRAVENOUS

## 2020-02-12 MED ORDER — SUCCINYLCHOLINE CHLORIDE 200 MG/10ML IV SOSY
PREFILLED_SYRINGE | INTRAVENOUS | Status: AC
  Filled 2020-02-12: qty 10

## 2020-02-12 MED ORDER — FENTANYL CITRATE (PF) 100 MCG/2ML IJ SOLN
INTRAMUSCULAR | Status: AC
  Filled 2020-02-12: qty 2

## 2020-02-12 MED ORDER — FENTANYL CITRATE (PF) 100 MCG/2ML IJ SOLN
INTRAMUSCULAR | Status: DC | PRN
  Administered 2020-02-12: 25 ug via INTRAVENOUS

## 2020-02-12 MED ORDER — LACTATED RINGERS IV SOLN
INTRAVENOUS | Status: DC | PRN
Start: 2020-02-12 — End: 2020-02-12

## 2020-02-12 MED ORDER — PROPOFOL 10 MG/ML IV BOLUS
INTRAVENOUS | Status: AC
  Filled 2020-02-12: qty 20

## 2020-02-12 MED ORDER — LIDOCAINE 2% (20 MG/ML) 5 ML SYRINGE
INTRAMUSCULAR | Status: AC
  Filled 2020-02-12: qty 5

## 2020-02-12 MED ORDER — PROPOFOL 10 MG/ML IV BOLUS
INTRAVENOUS | Status: DC | PRN
  Administered 2020-02-12: 100 mg via INTRAVENOUS
  Administered 2020-02-12: 50 mg via INTRAVENOUS

## 2020-02-12 MED ORDER — EPHEDRINE SULFATE-NACL 50-0.9 MG/10ML-% IV SOSY
PREFILLED_SYRINGE | INTRAVENOUS | Status: DC | PRN
  Administered 2020-02-12: 5 mg via INTRAVENOUS

## 2020-02-12 MED ORDER — SUGAMMADEX SODIUM 200 MG/2ML IV SOLN
INTRAVENOUS | Status: DC | PRN
  Administered 2020-02-12: 200 mg via INTRAVENOUS

## 2020-02-12 MED ORDER — GLYCOPYRROLATE PF 0.2 MG/ML IJ SOSY
PREFILLED_SYRINGE | INTRAMUSCULAR | Status: AC
  Filled 2020-02-12: qty 1

## 2020-02-12 MED ORDER — DEXAMETHASONE SODIUM PHOSPHATE 10 MG/ML IJ SOLN
4.0000 mg | Freq: Once | INTRAMUSCULAR | Status: AC
  Administered 2020-02-12: 4 mg via INTRAVENOUS

## 2020-02-12 MED ORDER — DEXAMETHASONE SODIUM PHOSPHATE 4 MG/ML IJ SOLN
INTRAMUSCULAR | Status: DC | PRN
  Administered 2020-02-12: 4 mg via INTRAVENOUS

## 2020-02-12 MED ORDER — ONDANSETRON HCL 4 MG/2ML IJ SOLN
INTRAMUSCULAR | Status: DC | PRN
  Administered 2020-02-12: 4 mg via INTRAVENOUS

## 2020-02-12 MED ORDER — LACTATED RINGERS IV SOLN: Freq: Once | INTRAVENOUS | Status: AC

## 2020-02-12 MED ORDER — ONDANSETRON HCL 4 MG/2ML IJ SOLN: 4.0000 mg | Freq: Four times a day (QID) | INTRAMUSCULAR | Status: DC | PRN

## 2020-02-12 MED ORDER — ONDANSETRON HCL 4 MG PO TABS: 4.0000 mg | ORAL_TABLET | Freq: Four times a day (QID) | ORAL | Status: DC | PRN

## 2020-02-12 MED ORDER — DEXAMETHASONE SODIUM PHOSPHATE 4 MG/ML IJ SOLN
INTRAMUSCULAR | Status: AC
  Filled 2020-02-12: qty 1

## 2020-02-12 NOTE — Anesthesia Postprocedure Evaluation (Signed)
Anesthesia Post Note  Patient: [name]  Procedure(s) Performed: ESOPHAGOGASTRODUODENOSCOPY (EGD) WITH PROPOFOL (N/A ) BIOPSY  Patient location during evaluation: PACU Anesthesia Type: General Level of consciousness: awake, awake and alert, oriented and sedated Pain management: pain level controlled Vital Signs Assessment: post-procedure vital signs reviewed and stable Respiratory status: spontaneous breathing, respiratory function stable and patient connected to nasal cannula oxygen Cardiovascular status: blood pressure returned to baseline Postop Assessment: no apparent nausea or vomiting Anesthetic complications: no   No complications documented.   Last Vitals:  Vitals:   02/12/20 1930 02/12/20 1931  BP: (!) 143/84 (!) 162/85  Pulse: 79 76  Resp:    Temp:    SpO2: (!) 86% 93%    Last Pain:  Vitals:   02/12/20 1914  TempSrc:   PainSc: 0-No pain                 Sarabi Sockwell C Vashti Bolanos

## 2020-02-12 NOTE — Patient Instructions (Signed)
Please proceed to [facility] Emergency room. I suspect you are going to need an EGD. I will call over to the hospital to update them.   I suspect your loose stools will likely improve once you are able to get back on your medications and start eating normally again. Please continue to monitor this and let me know if you have persistent diarrhea.   We will plan to see you back in the next 6-8 weeks for follow-up.   Aliene Altes, PA-C [facility] Gastroenterology

## 2020-02-12 NOTE — Op Note (Addendum)
[facility] Patient Name: [name] Procedure Date: 02/12/2020 4:02 PM MRN: [id number] Date of Birth: 1940-12-22 Attending MD: [name] ,  CSN: [id number] Age: 79 Admit Type: Outpatient Procedure:                Upper GI endoscopy Indications:              Foreign body in the esophagus Providers:                [name], Crystal Page, Gwynneth Albright RN, RN, Randa Spike, Technician Referring MD:              Medicines:                General Anesthesia Complications:            No immediate complications. Estimated Blood Loss:     Estimated blood loss: none. Procedure:                Pre-Anesthesia Assessment:                           - Prior to the procedure, a History and Physical                            was performed, and patient medications, allergies                            and sensitivities were reviewed. The patient's                            tolerance of previous anesthesia was reviewed.                           - The risks and benefits of the procedure and the                            sedation options and risks were discussed with the                            patient. All questions were answered and informed                            consent was obtained.                           - ASA Grade Assessment: III - A patient with severe                            systemic disease.                           After obtaining informed consent, the endoscope was                            passed under direct vision. Throughout  the                            procedure, the patient's blood pressure, pulse, and                            oxygen saturations were monitored continuously. The                            [id number]-H190 ([id number]) scope was introduced through the                            mouth, and advanced to the gastric cardia. The                            upper GI endoscopy was accomplished without                             difficulty. The patient tolerated the procedure                            well. Scope In: 5:23:36 PM Scope Out: 6:34:25 PM Total Procedure Duration: 1 hour 10 minutes 49 seconds  Findings:      Food was found in the middle third of the esophagus. Removal of food was       accomplished with a combination of a talon tool, transparent suction       cap, Roth net and snare.      Three superficial esophageal ulcers with no stigmata of recent bleeding       were found adjacent to the food bolus, likely stasis ulcers. The largest       lesion was 6 mm in largest dimension.      One cratered non-necrotic esophageal ulcer with no stigmata of recent       bleeding was found in the mid esophagus, also adjacent to the food       bolus. The lesion was 15 mm in largest dimension. Biopsies were taken       with a cold forceps for histology.      The cardia was normal. Impression:               - Food in the middle third of the esophagus.                            Removal was successful.                           - Esophageal ulcers with no stigmata of recent                            bleeding - stasis ulcers.                           - Esophageal ulcer with no stigmata of recent                            bleeding. Biopsied.                           -  Normal cardia. Moderate Sedation:      Per Anesthesia Care Recommendation:           - Discharge patient to home (ambulatory).                           - Soft diet.                           - Use Protonix (pantoprazole) 40 mg PO BID for 2                            months.                           - Follow up in GI clinic, will require repeat EGD                            in 4-6 weeks. Procedure Code(s):        --- Professional ---                           [phone], GC, Esophagoscopy, flexible, transoral; with                            removal of foreign body(s)                           43202, Esophagoscopy, flexible,  transoral; with                            biopsy, single or multiple Diagnosis Code(s):        --- Professional ---                           W96.759F, Food in esophagus causing other injury,                            initial encounter                           K22.10, Ulcer of esophagus without bleeding                           T18.108A, Unspecified foreign body in esophagus                            causing other injury, initial encounter CPT copyright 2019 [employer]. All rights reserved. The codes documented in this report are preliminary and upon coder review may  be revised to meet current compliance requirements. [name], MD [name],  02/12/2020 6:51:53 PM This report has been signed electronically. Number of Addenda: 0

## 2020-02-12 NOTE — ED Notes (Signed)
Pt left with ENDO nurse to go to EGD

## 2020-02-12 NOTE — Anesthesia Procedure Notes (Signed)
Procedure Name: Intubation Date/Time: 02/12/2020 5:18 PM Performed by: Denese Killings, MD Pre-anesthesia Checklist: Patient identified, Emergency Drugs available, Suction available and Patient being monitored Patient Re-evaluated:Patient Re-evaluated prior to induction Oxygen Delivery Method: Circle system utilized Preoxygenation: Pre-oxygenation with 100% oxygen Induction Type: IV induction and Rapid sequence Laryngoscope Size: Glidescope Grade View: Grade I Tube type: Oral Tube size: 7.5 mm Number of attempts: 1 Airway Equipment and Method: Stylet and Oral airway Placement Confirmation: ETT inserted through vocal cords under direct vision,  positive ETCO2 and breath sounds checked- equal and bilateral Secured at: 24 cm Tube secured with: Tape Dental Injury: Teeth and Oropharynx as per pre-operative assessment

## 2020-02-12 NOTE — Transfer of Care (Signed)
Immediate Anesthesia Transfer of Care Note  Patient: KHYLE GOODELL  Procedure(s) Performed: ESOPHAGOGASTRODUODENOSCOPY (EGD) WITH PROPOFOL (N/A ) BIOPSY  Patient Location: PACU  Anesthesia Type:General  Level of Consciousness: awake, alert , oriented and sedated  Airway & Oxygen Therapy: Patient Spontanous Breathing and Patient connected to nasal cannula oxygen  Post-op Assessment: Post -op Vital signs reviewed and stable  Post vital signs: Reviewed and stable  Last Vitals:  Vitals Value Taken Time  BP 159/95 02/12/20 1852  Temp 36.4 C 02/12/20 1851  Pulse 80 02/12/20 1859  Resp 15 02/12/20 1859  SpO2 96 % 02/12/20 1859  Vitals shown include unvalidated device data.  Last Pain:  Vitals:   02/12/20 1851  TempSrc:   PainSc: 0-No pain         Complications: No complications documented.

## 2020-02-12 NOTE — Consult Note (Addendum)
[name], M.D. Gastroenterology & Hepatology                                           Patient Name: [name]  MRN: [id number] Admission Date: 02/12/2020 Date of Evaluation:  02/12/2020 Time of Evaluation: 2:23 PM  Referring Physician: Kem Parkinson  Chief Complaint:   Food impaction  HPI:  This is a 79 y.o. male with past medical history orthotopic liver transplantation for Nash/cirrhosis complicated by choledocholithiasis status post ERCP in 2012, diabetes type 2, obesity, hypertension, hypothyroidism, who came to the hospital for evaluation of food impaction.  The patient reports he was eating hotdogs past Monday during the night and he felt the food got stuck in the retrosternal area.  He reported that he was not able to swallow any liquids and he had persistent retrosternal chest discomfort after this.  He reported having episodes of retching and he endorsed he try to cough "his food out".  Due to this, the patient decided to come to the ER on 02/10/2020.  The patient had a CBC and a CMP that were within normal limits, chest x-ray was also unremarkable.  He was discharged home on Zofran to decrease his vomiting spells.  The patient reports that he has been constantly drooling since then and has not been able to swallow any of his medications, he is not even able to drink any water at the moment.  The patient reports that he had similar episodes of dysphagia to solids for the last 1 to 2 months but the food will eventually go down after drinking some water.  Prior to this he never had similar symptoms.  He denies any episodes of heartburn or taking any PPI/anti-H2 medication.  Due to his poor oral intake he has lost close to 7 pounds since Monday.  Seasonal allegies: Denies  Medications currently used: Patient takes aspirin 325 mg every day and Prograf 0.5 mg twice daily.  Previous EGD: Last performed was in 03/21/2011 for ERCP.  No presence of esophageal strictures or narrowing were  reported. Previous colonoscopy:09/08/2012-colonic diverticulosis.   In the ER, his vital signs were stable, he was afebrile. He was protecting his airway. Labs were notable for CBC within normal limits, BMP with creatinine 1.4 BUN 41 rest within normal limits, negative Covid testing.    Past Medical History: SEE CHRONIC ISSSUES: Past Medical History:  Diagnosis Date  . Anemia   . Biliary calculi, common bile duct   . CRI (chronic renal insufficiency)   . DM (diabetes mellitus) ([place])    Type II  . Elevated liver enzymes   . Esophageal varices ([place]) 03/19/11   mrcp  . History of ERCP 03/21/2011   Extrahepatic biliary obstruction secondary to occluded biliary stent and colon no cholelithiasis, status post sphincterotomy, stent removal and replacement and stone extraction.  Marland Kitchen HTN (hypertension)   . Hypothyroidism   . NASH (nonalcoholic steatohepatitis) 2006   liver transplant  . Pancreatitis chronic 03/19/11   mrcp  . S/P colonoscopy with polypectomy 2004   Dr Gala Romney  . S/P endoscopy 03/2004   Hx esophgaeal varices, pyloric channel ulcer prior to transplant  . Squamous cell cancer of external ear    pinna   Past Surgical History:  Past Surgical History:  Procedure Laterality Date  . BILE DUCT STENT PLACEMENT  03/21/11   Dr. Gala Romney  .  CHOLECYSTECTOMY  2006  . COLONOSCOPY  08/12/2002   Dr. Gala Romney- somewhat diffusely friable rectum and colon, polyp at 30 cm.  . COLONOSCOPY N/A 09/08/2012   Procedure: COLONOSCOPY;  Surgeon: Daneil Dolin, MD;  colonic diverticulosis. Repeat in 10 years.   Marland Kitchen ERCP  03/21/2011   Dr. Gala Romney- ERCP with removal of pediatric feeding tube, endoscopic sphinecterotomy with balloon dilation of ampulla followed by stone extraction and stent placement  . ESOPHAGOGASTRODUODENOSCOPY  03/20/2004   Dr. Nash Mantis 2 esophageal varices o/w normal esophageal mucosa, mild changes of snake skin in the gastric mucosa consistent with portal gastropathy, multiple antral erosions  and  a  single pyloric channel ulcer benign appearance o/w normal gastric mucosa, normal D1 and S2  . KNEE ARTHROSCOPY WITH EXCISION PLICA Left 6/83/4196   Procedure: KNEE ARTHROSCOPY WITH EXCISION PLICA;  Surgeon: Carole Civil, MD;  Location: [facility];  Service: Orthopedics;  Laterality: Left;  . KNEE ARTHROSCOPY WITH MEDIAL MENISECTOMY Left 11/21/2012   Procedure: KNEE ARTHROSCOPY WITH MEDIAL AND LATERAL MENISECTOMY;  Surgeon: Carole Civil, MD;  Location: [facility];  Service: Orthopedics;  Laterality: Left;  . LIVER TRANSPLANT  2006   UVA-NASH cirrhosis  . MOUTH SURGERY  may 2012  . MRCP  03/19/11   biliary ductal dilatation- stones. esophageal varices  . SPHINCTEROTOMY  03/21/2011   Procedure: SPHINCTEROTOMY;  Surgeon: Daneil Dolin, MD;  Location: [facility];  Service: Gastroenterology;;   Family History:  Family History  Problem Relation Age of Onset  . Cirrhosis Father        nash  . Lymphoma Mother   . Diabetes Daughter    Social History:  Social History   Tobacco Use  . Smoking status: Never Smoker  . Smokeless tobacco: Never Used  Vaping Use  . Vaping Use: Never used  Substance Use Topics  . Alcohol use: No  . Drug use: No    Home Medications:  Prior to Admission medications   Medication Sig Start Date End Date Taking? Authorizing Provider  aspirin 325 MG tablet Take 1 tablet (325 mg total) by mouth daily. 04/16/17  Yes Oswald Hillock, MD  docusate sodium (COLACE) 100 MG capsule Take 100 mg by mouth at bedtime.    Yes [provider]  insulin aspart (NOVOLOG FLEXPEN) 100 UNIT/ML FlexPen Inject 12-18 Units into the skin 3 (three) times daily with meals. Give per sliding scale three times a day 07/13/19  Yes Nida, Marella Chimes, MD  Insulin Glargine (LANTUS SOLOSTAR) 100 UNIT/ML Solostar Pen Inject 34 Units into the skin daily at 10 pm. 07/13/19  Yes Nida, Marella Chimes, MD  levothyroxine (SYNTHROID) 137 MCG tablet Take 1 tablet (137 mcg total) by mouth daily before  breakfast. 07/13/19  Yes Nida, Marella Chimes, MD  Multiple Vitamins-Minerals (MULTIVITAMIN WITH MINERALS) tablet Take 1 tablet by mouth daily.     Yes [provider]  olmesartan (BENICAR) 40 MG tablet Take 40 mg by mouth daily.  10/23/19  Yes [provider]  ondansetron (ZOFRAN ODT) 8 MG disintegrating tablet 33m ODT q4 hours prn nausea 02/10/20  Yes Delo, DNathaneil Canary MD  tacrolimus (PROGRAF) 1 MG capsule Take 0.5 mg by mouth 2 (two) times daily.    Yes [provider]  oxyCODONE-acetaminophen (PERCOCET/ROXICET) 5-325 MG tablet Take 1 tablet by mouth every 4 (four) hours as needed. Patient not taking: Reported on 02/12/2020 04/16/17   LOswald Hillock MD    Inpatient Medications: No current facility-administered medications for this encounter.  Current Outpatient Medications:  .  aspirin 325 MG tablet, Take 1 tablet (325 mg total) by mouth daily., Disp: 30 tablet, Rfl: 2 .  docusate sodium (COLACE) 100 MG capsule, Take 100 mg by mouth at bedtime. , Disp: , Rfl:  .  insulin aspart (NOVOLOG FLEXPEN) 100 UNIT/ML FlexPen, Inject 12-18 Units into the skin 3 (three) times daily with meals. Give per sliding scale three times a day, Disp: 10 pen, Rfl: 1 .  Insulin Glargine (LANTUS SOLOSTAR) 100 UNIT/ML Solostar Pen, Inject 34 Units into the skin daily at 10 pm., Disp: 45 mL, Rfl: 1 .  levothyroxine (SYNTHROID) 137 MCG tablet, Take 1 tablet (137 mcg total) by mouth daily before breakfast., Disp: 30 tablet, Rfl: 6 .  Multiple Vitamins-Minerals (MULTIVITAMIN WITH MINERALS) tablet, Take 1 tablet by mouth daily.  , Disp: , Rfl:  .  olmesartan (BENICAR) 40 MG tablet, Take 40 mg by mouth daily. , Disp: , Rfl:  .  ondansetron (ZOFRAN ODT) 8 MG disintegrating tablet, 22m ODT q4 hours prn nausea, Disp: 6 tablet, Rfl: 0 .  tacrolimus (PROGRAF) 1 MG capsule, Take 0.5 mg by mouth 2 (two) times daily. , Disp: , Rfl:  .  oxyCODONE-acetaminophen (PERCOCET/ROXICET) 5-325 MG tablet, Take 1 tablet by  mouth every 4 (four) hours as needed. (Patient not taking: Reported on 02/12/2020), Disp: 10 tablet, Rfl: 0 Allergies: Contrast media [iodinated diagnostic agents]  Complete Review of Systems: GENERAL: negative for malaise, night sweats HEENT: No changes in hearing or vision, no nose bleeds or other nasal problems. NECK: Negative for lumps, goiter, pain and significant neck swelling RESPIRATORY: Negative for cough, wheezing CARDIOVASCULAR: Negative for chest pain, leg swelling, palpitations, orthopnea GI: SEE HPI MUSCULOSKELETAL: Negative for joint pain or swelling, back pain, and muscle pain. SKIN: Negative for lesions, rash PSYCH: Negative for sleep disturbance, mood disorder and recent psychosocial stressors. HEMATOLOGY Negative for prolonged bleeding, bruising easily, and swollen nodes. ENDOCRINE: Negative for cold or heat intolerance, polyuria, polydipsia and goiter. NEURO: negative for tremor, gait imbalance, syncope and seizures. The remainder of the review of systems is noncontributory.  Physical Exam: BP (!) 142/74 (BP Location: Right Arm)   Pulse 78   Temp 98.3 F (36.8 C) (Oral)   Resp 18   Ht 5' 9"  (1.753 m)   Wt 114 kg   SpO2 92%   BMI 37.11 kg/m  General: Awake and alert in no distress, cooperative. Obese. Skin: Skin color, texture, turgor normal. No rashes or lesions. HEENT: PERRL, EOMI, and normal dentition Neck: Supple without JVD or Lymphadenopathy Cardiac: RRR, S1S2 with no MGR Lungs: Clear to auscultation bilaterally Abdomen: Soft non-tender, non-distended, normal bowel sounds. Has upper surgical scars. Extremities: No deformities, edema, clubbing or skin discoloration. Neurological: No focal deficit, CN 2-12 grossly intact.  Laboratory Data CBC:     Component Value Date/Time   WBC 10.0 02/12/2020 1241   RBC 4.10 (L) 02/12/2020 1241   HGB 13.2 02/12/2020 1241   HCT 42.7 02/12/2020 1241   HCT 34 02/20/2011 1332   PLT 193 02/12/2020 1241   MCV 104.1  (H) 02/12/2020 1241   MCH 32.2 02/12/2020 1241   MCHC 30.9 02/12/2020 1241   RDW 13.4 02/12/2020 1241   LYMPHSABS 1.5 02/12/2020 1241   MONOABS 0.6 02/12/2020 1241   EOSABS 0.2 02/12/2020 1241   BASOSABS 0.1 02/12/2020 1241   COAG:  Lab Results  Component Value Date   INR 1.07 04/12/2017   INR 1.1 05/12/2012   INR 1.34 03/20/2011  BMP:  BMP Latest Ref Rng & Units 02/12/2020 02/10/2020 02/09/2019  Glucose 70 - 99 mg/dL 174(H) 165(H) 121(H)  BUN 8 - 23 mg/dL 41(H) 29(H) 23  Creatinine 0.61 - 1.24 mg/dL 1.44(H) 1.30(H) 1.21  BUN/Creat Ratio 10 - 24 - - 19  Sodium 135 - 145 mmol/L 141 142 139  Potassium 3.5 - 5.1 mmol/L 4.0 3.8 4.7  Chloride 98 - 111 mmol/L 107 108 105  CO2 22 - 32 mmol/L 27 24 23   Calcium 8.9 - 10.3 mg/dL 8.7(L) 8.8(L) 8.9    HEPATIC:  Hepatic Function Latest Ref Rng & Units 02/10/2020 02/09/2019 08/25/2018  Total Protein 6.5 - 8.1 g/dL 7.0 6.9 6.8  Albumin 3.5 - 5.0 g/dL 3.5 3.9 3.9  AST 15 - 41 U/L 21 27 21   ALT 0 - 44 U/L 18 22 13   Alk Phosphatase 38 - 126 U/L 58 68 81  Total Bilirubin 0.3 - 1.2 mg/dL 0.9 0.7 0.7  Bilirubin, Direct 0.0 - 0.3 mg/dL - - -    CARDIAC:  Lab Results  Component Value Date   TROPONINI <0.03 04/12/2017     Imaging: I personally reviewed and interpreted the available imaging  Assessment & Plan: This is a 79 y.o. male with past medical history orthotopic liver transplantation for Nash/cirrhosis complicated by choledocholithiasis status post ERCP in 2012, diabetes type 2, obesity, hypertension, hypothyroidism, who came to the hospital for evaluation of food impaction - first episode. The patient has not presented any previous red flag signs. DDx includes possible peptic strictures vs EoE. For now, we'll give to keep the patient nothing by mouth and will proceed with EGD. He will require general anesthesia due to the risk of aspiration.  Patient understands what the procedure involves including the benefits and any risks. Patient  understands alternatives to the proposed procedure. Risks including (but not limited to) bleeding, tearing of the lining (perforation), rupture of adjacent organs, problems with heart and lung function, infection, and medication reactions. A small percentage of complications may require surgery, hospitalization, repeat endoscopic procedure, and/or transfusion.   - Keep NPO - Emergent EGD - Anesthesia evaluation for general anesthesia - high risk of aspiration   Harvel Quale, MD Gastroenterology and Hepatology [facility]   Note: Occasional unusual wording and randomly placed punctuation marks may result from the use of speech recognition technology to transcribe this document

## 2020-02-12 NOTE — Assessment & Plan Note (Addendum)
79 year old male with inability to tolerate p.o. intake since Monday after he felt a hot dog get hung in his esophagus.  He was seen in the emergency room 02/10/2020, but it was not relayed to staff that he felt something was hung in his esophagus.  He did cough some of the hotdog up in the ED, but he has not been able to tolerate food, liquids, or any of his medications.  Denies nausea or abdominal pain.  Reports 2-week history of intermittent dysphagia symptoms.  No history of GERD.  Due to inability to tolerate p.o. intake, I am sending patient to the emergency room.  I suspect he is going to need EGD today for further evaluation/management.  Concern for food impaction.  Cannot rule out esophageal malignancy.  We will plan to see him back in the office in 6-8 weeks for follow-up.

## 2020-02-12 NOTE — ED Provider Notes (Signed)
[facility] EMERGENCY DEPARTMENT Provider Note   CSN: [id number] Arrival date & time: 02/12/20  2018     History Chief Complaint  Patient presents with  . S/P Endo Procedure    [name] is a 79 y.o. male.  Patient seen earlier today.  Was determined to have a food impaction.  Went to endoscopy for gastroenterology Dr. Melony Overly and Dr. Arsenio Katz to remove an impacted hot dog.  His Covid testing earlier today was negative.  Is had extensive labs for the procedure.  Post procedure patient dropped his oxygen saturations on room air down to 86%.  Patient was sent back to the emergency department.  The concern was some kind of aspiration to the procedure.  Patient was still little groggy.  His family member eventually did arrive which helped give a lot of information.  Apparently patient had low oxygen saturations when he was seen here on Tuesday going into Wednesday morning.  Oxygen saturations 88% at that time.  Hotdog probably got stuck on Monday based on the history.  The upper endoscopy did show some abnormal findings.  They want the patient on a soft diet.  They wanted him on Protonix and gastroenterology was planning to rescope him in a few weeks.        Past Medical History:  Diagnosis Date  . Anemia   . Biliary calculi, common bile duct   . CRI (chronic renal insufficiency)   . DM (diabetes mellitus) ([place])    Type II  . Elevated liver enzymes   . Esophageal varices ([place]) 03/19/11   mrcp  . History of ERCP 03/21/2011   Extrahepatic biliary obstruction secondary to occluded biliary stent and colon no cholelithiasis, status post sphincterotomy, stent removal and replacement and stone extraction.  Marland Kitchen HTN (hypertension)   . Hypothyroidism   . NASH (nonalcoholic steatohepatitis) 2006   liver transplant  . Pancreatitis chronic 03/19/11   mrcp  . S/P colonoscopy with polypectomy 2004   Dr Gala Romney  . S/P endoscopy 03/2004   Hx esophgaeal varices, pyloric channel ulcer prior to  transplant  . Squamous cell cancer of external ear    pinna    Patient Active Problem List   Diagnosis Date Noted  . Dysphagia 02/12/2020  . Regurgitation of food 02/12/2020  . Loose stools 02/12/2020  . Stroke (cerebrum) ([place]) 04/12/2017  . Left leg weakness 04/12/2017  . CVA (cerebral vascular accident) ([place]) 04/12/2017  . Class 2 severe obesity due to excess calories with serious comorbidity and body mass index (BMI) of 37.0 to 37.9 in adult [facility]) 06/29/2016  . Type 2 diabetes mellitus with stage 3 chronic kidney disease, with long-term current use of insulin ([place]) 06/15/2015  . Mixed hyperlipidemia 06/15/2015  . Essential hypertension, benign 06/15/2015  . Medial meniscus tear 11/24/2012  . Lateral meniscal tear 11/24/2012  . OA (osteoarthritis) of knee 02/28/2012  . Obstructive hyperbilirubinemia 03/27/2011  . Papular urticaria 03/18/2011  . LFTs abnormal 03/17/2011  . Liver replaced by transplant ([place]) 01/05/2009  . GOUT, UNSPECIFIED 01/04/2009  . RADIATION THERAPY, HX OF 01/04/2009  . Hypothyroidism 12/31/2008  . ANEMIA, NORMOCYTIC 12/31/2008  . LIVER FAILURE, ACUTE 12/31/2008  . Other chronic nonalcoholic liver disease 27/74/1287  . CKD (chronic kidney disease), stage II 12/31/2008    Past Surgical History:  Procedure Laterality Date  . BILE DUCT STENT PLACEMENT  03/21/11   Dr. Gala Romney  . CHOLECYSTECTOMY  2006  . COLONOSCOPY  08/12/2002   Dr. Gala Romney- somewhat diffusely friable rectum  and colon, polyp at 30 cm.  . COLONOSCOPY N/A 09/08/2012   Procedure: COLONOSCOPY;  Surgeon: Daneil Dolin, MD;  colonic diverticulosis. Repeat in 10 years.   Marland Kitchen ERCP  03/21/2011   Dr. Gala Romney- ERCP with removal of pediatric feeding tube, endoscopic sphinecterotomy with balloon dilation of ampulla followed by stone extraction and stent placement  . ESOPHAGOGASTRODUODENOSCOPY  03/20/2004   Dr. Nash Mantis 2 esophageal varices o/w normal esophageal mucosa, mild changes of snake skin in the gastric  mucosa consistent with portal gastropathy, multiple antral erosions  and  a single pyloric channel ulcer benign appearance o/w normal gastric mucosa, normal D1 and S2  . KNEE ARTHROSCOPY WITH EXCISION PLICA Left 1/61/0960   Procedure: KNEE ARTHROSCOPY WITH EXCISION PLICA;  Surgeon: Carole Civil, MD;  Location: [facility];  Service: Orthopedics;  Laterality: Left;  . KNEE ARTHROSCOPY WITH MEDIAL MENISECTOMY Left 11/21/2012   Procedure: KNEE ARTHROSCOPY WITH MEDIAL AND LATERAL MENISECTOMY;  Surgeon: Carole Civil, MD;  Location: [facility];  Service: Orthopedics;  Laterality: Left;  . LIVER TRANSPLANT  2006   UVA-NASH cirrhosis  . MOUTH SURGERY  may 2012  . MRCP  03/19/11   biliary ductal dilatation- stones. esophageal varices  . SPHINCTEROTOMY  03/21/2011   Procedure: SPHINCTEROTOMY;  Surgeon: Daneil Dolin, MD;  Location: [facility];  Service: Gastroenterology;;       Family History  Problem Relation Age of Onset  . Cirrhosis Father        nash  . Lymphoma Mother   . Diabetes Daughter     Social History   Tobacco Use  . Smoking status: Never Smoker  . Smokeless tobacco: Never Used  Vaping Use  . Vaping Use: Never used  Substance Use Topics  . Alcohol use: No  . Drug use: No    Home Medications Prior to Admission medications   Medication Sig Start Date End Date Taking? Authorizing Provider  aspirin 325 MG tablet Take 1 tablet (325 mg total) by mouth daily. 04/16/17  Yes Oswald Hillock, MD  docusate sodium (COLACE) 100 MG capsule Take 100 mg by mouth at bedtime.    Yes [provider]  insulin aspart (NOVOLOG FLEXPEN) 100 UNIT/ML FlexPen Inject 12-18 Units into the skin 3 (three) times daily with meals. Give per sliding scale three times a day 07/13/19  Yes Nida, Marella Chimes, MD  Insulin Glargine (LANTUS SOLOSTAR) 100 UNIT/ML Solostar Pen Inject 34 Units into the skin daily at 10 pm. 07/13/19  Yes Nida, Marella Chimes, MD  levothyroxine (SYNTHROID) 137 MCG tablet Take 1  tablet (137 mcg total) by mouth daily before breakfast. 07/13/19  Yes Nida, Marella Chimes, MD  Multiple Vitamins-Minerals (MULTIVITAMIN WITH MINERALS) tablet Take 1 tablet by mouth daily.     Yes [provider]  olmesartan (BENICAR) 40 MG tablet Take 40 mg by mouth daily.  10/23/19  Yes [provider]  omeprazole (PRILOSEC) 40 MG capsule Take 1 capsule (40 mg total) by mouth in the morning and at bedtime. 02/12/20  Yes Montez Morita, Quillian Quince, MD  ondansetron (ZOFRAN ODT) 8 MG disintegrating tablet 8mg  ODT q4 hours prn nausea 02/10/20  Yes Delo, Nathaneil Canary, MD  oxyCODONE-acetaminophen (PERCOCET/ROXICET) 5-325 MG tablet Take 1 tablet by mouth every 4 (four) hours as needed. 04/16/17  Yes Oswald Hillock, MD  tacrolimus (PROGRAF) 1 MG capsule Take 0.5 mg by mouth 2 (two) times daily.    Yes [provider]    Allergies    Contrast media [  iodinated diagnostic agents]  Review of Systems   Review of Systems  Constitutional: Negative for chills and fever.  HENT: Negative for congestion, rhinorrhea and sore throat.   Eyes: Negative for visual disturbance.  Respiratory: Negative for cough and shortness of breath.   Cardiovascular: Negative for chest pain and leg swelling.  Gastrointestinal: Negative for abdominal pain, diarrhea, nausea and vomiting.  Genitourinary: Negative for dysuria.  Musculoskeletal: Negative for back pain and neck pain.  Skin: Negative for rash.  Neurological: Negative for dizziness, light-headedness and headaches.  Hematological: Does not bruise/bleed easily.  Psychiatric/Behavioral: Negative for confusion.    Physical Exam Updated Vital Signs Pulse 68   Temp 97.8 F (36.6 C) (Oral)   Ht 1.753 m (5\' 9" )   Wt 114 kg   SpO2 (!) 87%   BMI 37.11 kg/m   Physical Exam Vitals and nursing note reviewed.  Constitutional:      General: He is not in acute distress.    Appearance: Normal appearance. He is well-developed. He is not ill-appearing.   HENT:     Head: Normocephalic and atraumatic.  Eyes:     Extraocular Movements: Extraocular movements intact.     Conjunctiva/sclera: Conjunctivae normal.     Pupils: Pupils are equal, round, and reactive to light.  Cardiovascular:     Rate and Rhythm: Normal rate and regular rhythm.     Heart sounds: No murmur heard.   Pulmonary:     Effort: Pulmonary effort is normal. No respiratory distress.     Breath sounds: Normal breath sounds. No wheezing.  Abdominal:     Palpations: Abdomen is soft.     Tenderness: There is no abdominal tenderness.  Musculoskeletal:        General: No swelling. Normal range of motion.     Cervical back: Normal range of motion and neck supple.  Skin:    General: Skin is warm and dry.     Capillary Refill: Capillary refill takes less than 2 seconds.  Neurological:     General: No focal deficit present.     Mental Status: He is alert and oriented to person, place, and time.     Cranial Nerves: No cranial nerve deficit.     Sensory: No sensory deficit.     ED Results / Procedures / Treatments   Labs (all labs ordered are listed, but only abnormal results are displayed) Labs Reviewed  CBG MONITORING, ED - Abnormal; Notable for the following components:      Result Value   Glucose-Capillary 157 (*)    All other components within normal limits    EKG None  Radiology DG Chest 1 View  Result Date: 02/12/2020 CLINICAL DATA:  Patient feels like something stuck in the esophagus and cannot eat or drink anything. Recent endoscopy. Negative COVID test. EXAM: CHEST  1 VIEW COMPARISON:  02/10/2020 FINDINGS: Shallow inspiration with elevation of the right hemidiaphragm. Mild cardiac enlargement. No vascular congestion or edema. Atelectasis in the lung bases. Calcification of the aorta. Degenerative changes in the spine. IMPRESSION: Shallow inspiration with atelectasis in the lung bases. Electronically Signed   By: Lucienne Capers M.D.   On: 02/12/2020 21:41     Procedures Procedures (including critical care time)  Medications Ordered in ED Medications - No data to display  ED Course  I have reviewed the triage vital signs and the nursing notes.  Pertinent labs & imaging results that were available during my care of the patient were reviewed by me and  considered in my medical decision making (see chart for details).    MDM Rules/Calculators/A&P                         Okay patient with hypoxia on room air does not have oxygen at home.  But it does appear that he is probably had some hypoxia occurring prior to today.  Chest x-ray repeated here without any significant acute findings.  Patient's Covid testing earlier was normal.  On the oxygen here patient's oxygen sats are good.  But they will vary anywhere from 92% to 97% on the oxygen.  Discussed with the hospitalist they will admit.  Patient probably will need home oxygen.     Final Clinical Impression(s) / ED Diagnoses Final diagnoses:  Hypoxia    Rx / DC Orders ED Discharge Orders    None       Fredia Sorrow, MD 02/13/20 0100

## 2020-02-12 NOTE — ED Triage Notes (Signed)
Pt seen here this am for food obstruction, evaluated and then sent to Endo for Dr Guy Sandifer.   Food impaction was removed but pt had episode of drop in O2 Sats.   Pt is awake, alert, nad at this time. Pt denies complaints.

## 2020-02-12 NOTE — ED Provider Notes (Signed)
[facility] EMERGENCY DEPARTMENT Provider Note   CSN: [id number] Arrival date & time: 02/12/20  1104     History Chief Complaint  Patient presents with  . Food Intolerance    [name] is a 79 y.o. male.  HPI     [name] is a 79 y.o. male with past medical history of diabetes, liver transplant, chronic renal insufficiency, and esophageal varices who presents to the Emergency Department complaining of difficulty swallowing and possible food impaction.  Symptoms have been present for 4 days.  He states that he ate a hot dog on Monday and has been having nausea, difficulty swallowing and foreign body sensation in his throat since that time.  He was seen here on 02/10/2020 for nausea.  He states that he vomited a small amount of food that day that temporarily relieved his symptoms but he has continued to be unable to tolerate foods or liquids without vomiting.  This is soon as he tries to swallow anything it "comes right back up."  He denies any hematemesis, abdominal pain, fever, chills, chest pain or pressure.     Past Medical History:  Diagnosis Date  . Anemia   . Biliary calculi, common bile duct   . CRI (chronic renal insufficiency)   . DM (diabetes mellitus) ([place])    Type II  . Elevated liver enzymes   . Esophageal varices ([place]) 03/19/11   mrcp  . History of ERCP 03/21/2011   Extrahepatic biliary obstruction secondary to occluded biliary stent and colon no cholelithiasis, status post sphincterotomy, stent removal and replacement and stone extraction.  Marland Kitchen HTN (hypertension)   . Hypothyroidism   . NASH (nonalcoholic steatohepatitis) 2006   liver transplant  . Pancreatitis chronic 03/19/11   mrcp  . S/P colonoscopy with polypectomy 2004   Dr Gala Romney  . S/P endoscopy 03/2004   Hx esophgaeal varices, pyloric channel ulcer prior to transplant  . Squamous cell cancer of external ear    pinna    Patient Active Problem List   Diagnosis Date Noted  . Dysphagia 02/12/2020   . Regurgitation of food 02/12/2020  . Loose stools 02/12/2020  . Stroke (cerebrum) ([place]) 04/12/2017  . Left leg weakness 04/12/2017  . CVA (cerebral vascular accident) ([place]) 04/12/2017  . Class 2 severe obesity due to excess calories with serious comorbidity and body mass index (BMI) of 37.0 to 37.9 in adult [facility]) 06/29/2016  . Type 2 diabetes mellitus with stage 3 chronic kidney disease, with long-term current use of insulin ([place]) 06/15/2015  . Mixed hyperlipidemia 06/15/2015  . Essential hypertension, benign 06/15/2015  . Medial meniscus tear 11/24/2012  . Lateral meniscal tear 11/24/2012  . OA (osteoarthritis) of knee 02/28/2012  . Obstructive hyperbilirubinemia 03/27/2011  . Papular urticaria 03/18/2011  . LFTs abnormal 03/17/2011  . Liver replaced by transplant ([place]) 01/05/2009  . GOUT, UNSPECIFIED 01/04/2009  . RADIATION THERAPY, HX OF 01/04/2009  . Hypothyroidism 12/31/2008  . ANEMIA, NORMOCYTIC 12/31/2008  . LIVER FAILURE, ACUTE 12/31/2008  . Other chronic nonalcoholic liver disease 56/38/7564  . CKD (chronic kidney disease), stage II 12/31/2008    Past Surgical History:  Procedure Laterality Date  . BILE DUCT STENT PLACEMENT  03/21/11   Dr. Gala Romney  . CHOLECYSTECTOMY  2006  . COLONOSCOPY  08/12/2002   Dr. Gala Romney- somewhat diffusely friable rectum and colon, polyp at 30 cm.  . COLONOSCOPY N/A 09/08/2012   Procedure: COLONOSCOPY;  Surgeon: Daneil Dolin, MD;  colonic diverticulosis. Repeat in 10 years.   Marland Kitchen  ERCP  03/21/2011   Dr. Gala Romney- ERCP with removal of pediatric feeding tube, endoscopic sphinecterotomy with balloon dilation of ampulla followed by stone extraction and stent placement  . ESOPHAGOGASTRODUODENOSCOPY  03/20/2004   Dr. Nash Mantis 2 esophageal varices o/w normal esophageal mucosa, mild changes of snake skin in the gastric mucosa consistent with portal gastropathy, multiple antral erosions  and  a single pyloric channel ulcer benign appearance o/w normal gastric  mucosa, normal D1 and S2  . KNEE ARTHROSCOPY WITH EXCISION PLICA Left 9/67/5916   Procedure: KNEE ARTHROSCOPY WITH EXCISION PLICA;  Surgeon: Carole Civil, MD;  Location: [facility];  Service: Orthopedics;  Laterality: Left;  . KNEE ARTHROSCOPY WITH MEDIAL MENISECTOMY Left 11/21/2012   Procedure: KNEE ARTHROSCOPY WITH MEDIAL AND LATERAL MENISECTOMY;  Surgeon: Carole Civil, MD;  Location: [facility];  Service: Orthopedics;  Laterality: Left;  . LIVER TRANSPLANT  2006   UVA-NASH cirrhosis  . MOUTH SURGERY  may 2012  . MRCP  03/19/11   biliary ductal dilatation- stones. esophageal varices  . SPHINCTEROTOMY  03/21/2011   Procedure: SPHINCTEROTOMY;  Surgeon: Daneil Dolin, MD;  Location: [facility];  Service: Gastroenterology;;       Family History  Problem Relation Age of Onset  . Cirrhosis Father        nash  . Lymphoma Mother   . Diabetes Daughter     Social History   Tobacco Use  . Smoking status: Never Smoker  . Smokeless tobacco: Never Used  Vaping Use  . Vaping Use: Never used  Substance Use Topics  . Alcohol use: No  . Drug use: No    Home Medications Prior to Admission medications   Medication Sig Start Date End Date Taking? Authorizing Provider  aspirin 325 MG tablet Take 1 tablet (325 mg total) by mouth daily. Patient not taking: Reported on 02/12/2020 04/16/17   Oswald Hillock, MD  docusate sodium (COLACE) 100 MG capsule Take 100 mg by mouth at bedtime. Patient not taking: Reported on 02/12/2020    [provider]  insulin aspart (NOVOLOG FLEXPEN) 100 UNIT/ML FlexPen Inject 12-18 Units into the skin 3 (three) times daily with meals. Give per sliding scale three times a day Patient not taking: Reported on 02/12/2020 07/13/19   Cassandria Anger, MD  Insulin Glargine (LANTUS SOLOSTAR) 100 UNIT/ML Solostar Pen Inject 34 Units into the skin daily at 10 pm. Patient not taking: Reported on 02/12/2020 07/13/19   Cassandria Anger, MD  levothyroxine (SYNTHROID) 137 MCG  tablet Take 1 tablet (137 mcg total) by mouth daily before breakfast. Patient not taking: Reported on 02/12/2020 07/13/19   Cassandria Anger, MD  Multiple Vitamins-Minerals (MULTIVITAMIN WITH MINERALS) tablet Take 1 tablet by mouth daily.   Patient not taking: Reported on 02/12/2020    [provider]  olmesartan (BENICAR) 40 MG tablet Take 40 mg by mouth daily. Patient not taking: Reported on 02/12/2020 10/23/19   [provider]  ondansetron (ZOFRAN ODT) 8 MG disintegrating tablet 8mg  ODT q4 hours prn nausea Patient not taking: Reported on 02/12/2020 02/10/20   Veryl Speak, MD  [facility] ULTRA test strip TESTING FOUR TIMES DAILY. Patient not taking: Reported on 02/12/2020 02/10/20   Cassandria Anger, MD  oxyCODONE-acetaminophen (PERCOCET/ROXICET) 5-325 MG tablet Take 1 tablet by mouth every 4 (four) hours as needed. Patient not taking: Reported on 02/12/2020 04/16/17   Oswald Hillock, MD  Specialty Vitamins Products (MAGNESIUM, AMINO ACID CHELATE,) 133 MG tablet Take 1 tablet by  mouth 2 (two) times daily. Magnesium 133mg   Patient not taking: Reported on 02/12/2020    [provider]  tacrolimus (PROGRAF) 1 MG capsule Take 0.5 mg by mouth 2 (two) times daily.  Patient not taking: Reported on 02/12/2020    [provider]    Allergies    Contrast media [iodinated diagnostic agents]  Review of Systems   Review of Systems  Constitutional: Negative for appetite change, chills and fever.  HENT: Positive for trouble swallowing.   Respiratory: Negative for shortness of breath.   Cardiovascular: Negative for chest pain.  Gastrointestinal: Positive for vomiting. Negative for abdominal pain, blood in stool, diarrhea and nausea.  Genitourinary: Negative for decreased urine volume, difficulty urinating, dysuria and flank pain.  Musculoskeletal: Negative for back pain.  Skin: Negative for color change and rash.  Neurological: Negative for dizziness, weakness and numbness.   Hematological: Negative for adenopathy.    Physical Exam Updated Vital Signs BP (!) 142/74 (BP Location: Right Arm)   Pulse 78   Temp 98.3 F (36.8 C) (Oral)   Resp 18   Ht 5\' 9"  (1.753 m)   Wt 114 kg   SpO2 92%   BMI 37.11 kg/m   Physical Exam Vitals and nursing note reviewed.  Constitutional:      General: He is not in acute distress.    Appearance: Normal appearance.  HENT:     Mouth/Throat:     Mouth: Mucous membranes are moist.     Pharynx: Oropharynx is clear.  Cardiovascular:     Rate and Rhythm: Normal rate and regular rhythm.     Pulses: Normal pulses.  Pulmonary:     Effort: Pulmonary effort is normal. No respiratory distress.  Abdominal:     General: There is no distension.     Palpations: Abdomen is soft.     Tenderness: There is no abdominal tenderness.  Musculoskeletal:        General: Normal range of motion.     Cervical back: Normal range of motion. No tenderness.  Skin:    General: Skin is warm.     Capillary Refill: Capillary refill takes less than 2 seconds.     Findings: No rash.  Neurological:     General: No focal deficit present.     Mental Status: He is alert.     Sensory: Sensation is intact.     Motor: Motor function is intact. No weakness.     Comments: CN II through XII grossly intact.  Speech clear.     ED Results / Procedures / Treatments   Labs (all labs ordered are listed, but only abnormal results are displayed) Labs Reviewed  BASIC METABOLIC PANEL - Abnormal; Notable for the following components:      Result Value   Glucose, Bld 174 (*)    BUN 41 (*)    Creatinine, Ser 1.44 (*)    Calcium 8.7 (*)    GFR calc non Af Amer 46 (*)    GFR calc Af Amer 54 (*)    All other components within normal limits  CBC WITH DIFFERENTIAL/PLATELET - Abnormal; Notable for the following components:   RBC 4.10 (*)    MCV 104.1 (*)    All other components within normal limits  CBG MONITORING, ED - Abnormal; Notable for the following  components:   Glucose-Capillary 156 (*)    All other components within normal limits  SARS CORONAVIRUS 2 BY RT PCR (HOSPITAL ORDER, [facility])    EKG None  Radiology No results found.  Procedures Procedures (including critical care time)  Medications Ordered in ED Medications - No data to display  ED Course  I have reviewed the triage vital signs and the nursing notes.  Pertinent labs & imaging results that were available during my care of the patient were reviewed by me and considered in my medical decision making (see chart for details).    MDM Rules/Calculators/A&P                          Patient with dysphagia and vomiting for several days.  Symptoms began after eating a hot dog.  Likely food impaction.  No hematemesis.  Vitals reviewed.  Patient well-appearing.  Oral fluid challenge attempted.  Patient had small amount of vomiting, some water stayed down.  Will consult GI  1340  Consulted GI, Dr. Timmie Foerster who agrees to see pt for endoscopy.  36  Pt medically screened and transferred from ED to endo   Final Clinical Impression(s) / ED Diagnoses Final diagnoses:  Food impaction of esophagus, initial encounter    Rx / DC Orders ED Discharge Orders    None       Kem Parkinson, PA-C 02/12/20 1556    Long, W[facility], MD 02/13/20 [phone]

## 2020-02-12 NOTE — Discharge Instructions (Signed)
You are being discharged to home.  Eat a soft diet.  Take Protonix (pantoprazole) 40 mg by mouth twice a day for two months.  Follow up in GI clinic, will require repeat EGD in 4-6 weeks.  No meats until next GI apt.    Monitored Anesthesia Care, Care After These instructions provide you with information about caring for yourself after your procedure. Your health care provider may also give you more specific instructions. Your treatment has been planned according to current medical practices, but problems sometimes occur. Call your health care provider if you have any problems or questions after your procedure. What can I expect after the procedure? After your procedure, you may:  Feel sleepy for several hours.  Feel clumsy and have poor balance for several hours.  Feel forgetful about what happened after the procedure.  Have poor judgment for several hours.  Feel nauseous or vomit.  Have a sore throat if you had a breathing tube during the procedure. Follow these instructions at home: For at least 24 hours after the procedure:      Have a responsible adult stay with you. It is important to have someone help care for you until you are awake and alert.  Rest as needed.  Do not: ? Participate in activities in which you could fall or become injured. ? Drive. ? Use heavy machinery. ? Drink alcohol. ? Take sleeping pills or medicines that cause drowsiness. ? Make important decisions or sign legal documents. ? Take care of children on your own. Eating and drinking  Follow the diet that is recommended by your health care provider.  If you vomit, drink water, juice, or soup when you can drink without vomiting.  Make sure you have little or no nausea before eating solid foods. General instructions  Take over-the-counter and prescription medicines only as told by your health care provider.  If you have sleep apnea, surgery and certain medicines can increase your risk for  breathing problems. Follow instructions from your health care provider about wearing your sleep device: ? Anytime you are sleeping, including during daytime naps. ? While taking prescription pain medicines, sleeping medicines, or medicines that make you drowsy.  If you smoke, do not smoke without supervision.  Keep all follow-up visits as told by your health care provider. This is important. Contact a health care provider if:  You keep feeling nauseous or you keep vomiting.  You feel light-headed.  You develop a rash.  You have a fever. Get help right away if:  You have trouble breathing. Summary  For several hours after your procedure, you may feel sleepy and have poor judgment.  Have a responsible adult stay with you for at least 24 hours or until you are awake and alert. This information is not intended to replace advice given to you by your health care provider. Make sure you discuss any questions you have with your health care provider. Document Revised: 09/23/2017 Document Reviewed: 10/16/2015 Elsevier Patient Education  [name].

## 2020-02-12 NOTE — Anesthesia Preprocedure Evaluation (Addendum)
Anesthesia Evaluation  Patient identified by MRN, date of birth, ID band Patient awake    Reviewed: Allergy & Precautions, NPO status , Patient's Chart, lab work & pertinent test results  History of Anesthesia Complications Negative for: history of anesthetic complications  Airway Mallampati: III  TM Distance: >3 FB Neck ROM: Full    Dental  (+) Missing, Dental Advisory Given   Pulmonary    Pulmonary exam normal breath sounds clear to auscultation       Cardiovascular METS: 3 - Mets hypertension, Pt. on medications  Rhythm:Regular Rate:Normal  10-Feb-2020 05:18:50 [employer] [facility] ROUTINE RECORD Sinus rhythm Low voltage, precordial leads Abnormal R-wave progression, early transition Borderline abnrm T, anterolateral leads Confirmed by Veryl Speak [id number]) on 02/10/2020 5:33:38 AM   Neuro/Psych CVA, No Residual Symptoms    GI/Hepatic (+) Cirrhosis   Esophageal Varices    , Hepatitis -Liver transplant  Food impaction   Endo/Other  diabetes, Type 1, Insulin DependentHypothyroidism   Renal/GU Renal InsufficiencyRenal disease     Musculoskeletal  (+) Arthritis ,   Abdominal   Peds  Hematology  (+) anemia ,   Anesthesia Other Findings   Reproductive/Obstetrics                            Anesthesia Physical Anesthesia Plan  ASA: III and emergent  Anesthesia Plan: General   Post-op Pain Management:    Induction: Intravenous  PONV Risk Score and Plan: 2 and Ondansetron  Airway Management Planned: Oral ETT  Additional Equipment:   Intra-op Plan:   Post-operative Plan: Extubation in OR  Informed Consent: I have reviewed the patients History and Physical, chart, labs and discussed the procedure including the risks, benefits and alternatives for the proposed anesthesia with the patient or authorized representative who has indicated his/her understanding and  acceptance.     Dental advisory given  Plan Discussed with: Surgeon  Anesthesia Plan Comments: (Risk of kidney and liver function getting worse after anesthesia was explained. Patient hasn't eaten or drank much since Tuesday. Will give 1 liter of LR bolus before patient goes to OR)      Anesthesia Quick Evaluation

## 2020-02-12 NOTE — Brief Op Note (Signed)
Patient underwent EGD today under [facility]. Found to have solid food in mid esophagus. This was removed succesfully.  There was presence of some ulcers around the site of location, there was a larger cratered ulcer in the same location - biopsies were taken.  I received a notification by Dr. Charna Elizabeth about a change of status in the post-op recovery area. The patient presented persistent O2 desaturation in the mid 80s requiring oxygen by [place]. Patient will be admitted under medicine team for further evaluation.  Recs; - Admit under medicine service - O2 per [place] - Perform CXR now - Can advance diet to clear liquids if tolerated - OK to restart home meds - Pantoprazole 40 mg IV BID  [name], MD Gastroenterology and Hepatology [facility]

## 2020-02-12 NOTE — ED Notes (Signed)
Pt given diet gingerale to drink.   Pt's daughter remains at bedside and aware of plan to admit

## 2020-02-12 NOTE — Telephone Encounter (Signed)
Patient had food impaction and esophageal ulcers in recetn visit to the hospital. Mitzie, can you please check with [facility] if they can see him in 2 weeks?  Thanks

## 2020-02-12 NOTE — ED Triage Notes (Signed)
Pt states he cannot eat of drink anything, states he feels like something is stuck in his esophagus.  Reports when he tried to eat or drink he spits it back up.

## 2020-02-12 NOTE — ED Notes (Signed)
Pt given water, attempted to drink but vomited

## 2020-02-12 NOTE — Assessment & Plan Note (Signed)
Patient reports new onset of loose stools x 5 days.  Symptoms started after eating several tomatoes on Sunday.  He had one loose BM Monday.  No BM Tuesday or Wednesday.  Then a couple loose BMs Thursday and this morning.  Denies watery stool, BRBPR, melena, or abdominal pain.  Notably, he is also not tolerating p.o. intake since Monday following an episode of a hot dog getting hung in his esophagus, and therefore has also not been taking any of his medications.  I do not suspect any sort of infectious diarrhea.  Also doubt viral gastroenteritis.  Suspect symptoms were initially secondary to diet and may also not be influenced by limited p.o. intake and inability to take any of his medications.  I suspect symptoms will improve once he is able to resume eating and taking his medications.  I am sending him to the emergency room today and suspect he likely needs an EGD.  We will plan to follow-up with him in the office in 6-8 weeks to regroup.  Advised to let me know if he has any persistent or worsening diarrhea in the meantime.

## 2020-02-13 DIAGNOSIS — E039 Hypothyroidism, unspecified: Secondary | ICD-10-CM

## 2020-02-13 DIAGNOSIS — E1122 Type 2 diabetes mellitus with diabetic chronic kidney disease: Secondary | ICD-10-CM

## 2020-02-13 DIAGNOSIS — R0902 Hypoxemia: Secondary | ICD-10-CM

## 2020-02-13 DIAGNOSIS — Z794 Long term (current) use of insulin: Secondary | ICD-10-CM

## 2020-02-13 DIAGNOSIS — I10 Essential (primary) hypertension: Secondary | ICD-10-CM

## 2020-02-13 DIAGNOSIS — N183 Chronic kidney disease, stage 3 unspecified: Secondary | ICD-10-CM

## 2020-02-13 LAB — GLUCOSE, CAPILLARY
Glucose-Capillary: 204 mg/dL — ABNORMAL HIGH (ref 70–99)
Glucose-Capillary: 167 mg/dL — ABNORMAL HIGH (ref 70–99)
Glucose-Capillary: 213 mg/dL — ABNORMAL HIGH (ref 70–99)

## 2020-02-13 MED ORDER — PANTOPRAZOLE SODIUM 40 MG IV SOLR
40.0000 mg | Freq: Two times a day (BID) | INTRAVENOUS | Status: DC
  Administered 2020-02-13 (×2): 40 mg via INTRAVENOUS
  Filled 2020-02-13 (×2): qty 40

## 2020-02-13 MED ORDER — GUAIFENESIN ER 600 MG PO TB12
600.0000 mg | ORAL_TABLET | Freq: Two times a day (BID) | ORAL | 2 refills | Status: DC
Start: 2020-02-13 — End: 2020-03-16

## 2020-02-13 MED ORDER — PANTOPRAZOLE SODIUM 40 MG PO TBEC
40.0000 mg | DELAYED_RELEASE_TABLET | Freq: Two times a day (BID) | ORAL | 1 refills | Status: DC
Start: 2020-02-13 — End: 2020-03-16

## 2020-02-13 MED ORDER — INSULIN ASPART 100 UNIT/ML ~~LOC~~ SOLN
0.0000 [IU] | Freq: Three times a day (TID) | SUBCUTANEOUS | Status: DC
  Administered 2020-02-13: 3 [IU] via SUBCUTANEOUS
  Administered 2020-02-13: 5 [IU] via SUBCUTANEOUS

## 2020-02-13 MED ORDER — TACROLIMUS 0.5 MG PO CAPS
0.5000 mg | ORAL_CAPSULE | Freq: Two times a day (BID) | ORAL | Status: DC
  Administered 2020-02-13: 0.5 mg via ORAL
  Filled 2020-02-13: qty 1

## 2020-02-13 MED ORDER — PANTOPRAZOLE SODIUM 40 MG PO TBEC: 40.0000 mg | DELAYED_RELEASE_TABLET | Freq: Every day | ORAL | Status: DC

## 2020-02-13 MED ORDER — LEVOTHYROXINE SODIUM 137 MCG PO TABS
137.0000 ug | ORAL_TABLET | Freq: Every day | ORAL | Status: DC
  Administered 2020-02-13: 137 ug via ORAL
  Filled 2020-02-13: qty 1

## 2020-02-13 MED ORDER — IRBESARTAN 150 MG PO TABS
300.0000 mg | ORAL_TABLET | Freq: Every day | ORAL | Status: DC
  Administered 2020-02-13: 300 mg via ORAL
  Filled 2020-02-13 (×3): qty 1
  Filled 2020-02-13: qty 2

## 2020-02-13 NOTE — H&P (Signed)
History and Physical    YAFET CLINE SEG:[id number] DOB: Sep 30, 1940 DOA: 02/12/2020  PCP: Sharilyn Sites, MD   Patient coming from: Home.  I have personally briefly reviewed patient's old medical records in [place]  Chief Complaint: Difficulty swallowing.  HPI: [name] is a 79 y.o. male with medical history significant of anemia, cholelithiasis, stage IIIa CKD, type 2 diabetes, esophageal varices, history of ERCP, hypertension, hypothyroidism, NASH, chronic pancreatitis, squamous cell CA external ear who underwent a successful EGD for esophageal food impaction, but developed postprocedure hypoxia.  He is able to answer simple question and denied pain at this time.  He is unable to provide further history, but per records the patient came to the ED on 02/10/2020 due to nausea and vomiting.  He came back yesterday with a sensation of being unable to eat or drink anything and feeling that something was stuck in his esophagus.  Family members told Dr. Rogene Houston in the emergency department that he has had episodes of hypoxia in the past as well.  ED Course: Initial vital signs temperature 97.8 F, pulse 68, blood pressure 1 54/88, respiration 18, O2 sat 87% and then 98% on nasal cannula.    CBC shows white count 10.0, hemoglobin 13.2 g/dL and platelets 193.  LFTs are normal.  BMP shows normal electrolytes when calcium is corrected to albumin.  Glucose 174, BUN 41, and creatinine 1.44 mg/dL.  His chest radiograph shows shallow inspiration with atelectasis at the bases.  Review of Systems: As per HPI otherwise all other systems reviewed and are negative.  Past Medical History:  Diagnosis Date  . Anemia   . Biliary calculi, common bile duct   . CRI (chronic renal insufficiency)   . DM (diabetes mellitus) ([place])    Type II  . Elevated liver enzymes   . Esophageal varices ([place]) 03/19/11   mrcp  . History of ERCP 03/21/2011   Extrahepatic biliary obstruction secondary to occluded  biliary stent and colon no cholelithiasis, status post sphincterotomy, stent removal and replacement and stone extraction.  Marland Kitchen HTN (hypertension)   . Hypothyroidism   . NASH (nonalcoholic steatohepatitis) 2006   liver transplant  . Pancreatitis chronic 03/19/11   mrcp  . S/P colonoscopy with polypectomy 2004   Dr Gala Romney  . S/P endoscopy 03/2004   Hx esophgaeal varices, pyloric channel ulcer prior to transplant  . Squamous cell cancer of external ear    pinna    Past Surgical History:  Procedure Laterality Date  . BILE DUCT STENT PLACEMENT  03/21/11   Dr. Gala Romney  . CHOLECYSTECTOMY  2006  . COLONOSCOPY  08/12/2002   Dr. Gala Romney- somewhat diffusely friable rectum and colon, polyp at 30 cm.  . COLONOSCOPY N/A 09/08/2012   Procedure: COLONOSCOPY;  Surgeon: Daneil Dolin, MD;  colonic diverticulosis. Repeat in 10 years.   Marland Kitchen ERCP  03/21/2011   Dr. Gala Romney- ERCP with removal of pediatric feeding tube, endoscopic sphinecterotomy with balloon dilation of ampulla followed by stone extraction and stent placement  . ESOPHAGOGASTRODUODENOSCOPY  03/20/2004   Dr. Nash Mantis 2 esophageal varices o/w normal esophageal mucosa, mild changes of snake skin in the gastric mucosa consistent with portal gastropathy, multiple antral erosions  and  a single pyloric channel ulcer benign appearance o/w normal gastric mucosa, normal D1 and S2  . KNEE ARTHROSCOPY WITH EXCISION PLICA Left 7/37/1062   Procedure: KNEE ARTHROSCOPY WITH EXCISION PLICA;  Surgeon: Carole Civil, MD;  Location: [facility];  Service: Orthopedics;  Laterality: Left;  . KNEE ARTHROSCOPY WITH MEDIAL MENISECTOMY Left 11/21/2012   Procedure: KNEE ARTHROSCOPY WITH MEDIAL AND LATERAL MENISECTOMY;  Surgeon: Carole Civil, MD;  Location: [facility];  Service: Orthopedics;  Laterality: Left;  . LIVER TRANSPLANT  2006   UVA-NASH cirrhosis  . MOUTH SURGERY  may 2012  . MRCP  03/19/11   biliary ductal dilatation- stones. esophageal varices  . SPHINCTEROTOMY   03/21/2011   Procedure: SPHINCTEROTOMY;  Surgeon: Daneil Dolin, MD;  Location: [facility];  Service: Gastroenterology;;    Social History  reports that he has never smoked. He has never used smokeless tobacco. He reports that he does not drink alcohol and does not use drugs.  Allergies  Allergen Reactions  . Contrast Media [Iodinated Diagnostic Agents] Rash    Family History  Problem Relation Age of Onset  . Cirrhosis Father        nash  . Lymphoma Mother   . Diabetes Daughter    Prior to Admission medications   Medication Sig Start Date End Date Taking? Authorizing Provider  aspirin 325 MG tablet Take 1 tablet (325 mg total) by mouth daily. 04/16/17  Yes Oswald Hillock, MD  docusate sodium (COLACE) 100 MG capsule Take 100 mg by mouth at bedtime.    Yes [provider]  insulin aspart (NOVOLOG FLEXPEN) 100 UNIT/ML FlexPen Inject 12-18 Units into the skin 3 (three) times daily with meals. Give per sliding scale three times a day 07/13/19  Yes Nida, Marella Chimes, MD  Insulin Glargine (LANTUS SOLOSTAR) 100 UNIT/ML Solostar Pen Inject 34 Units into the skin daily at 10 pm. 07/13/19  Yes Nida, Marella Chimes, MD  levothyroxine (SYNTHROID) 137 MCG tablet Take 1 tablet (137 mcg total) by mouth daily before breakfast. 07/13/19  Yes Nida, Marella Chimes, MD  Multiple Vitamins-Minerals (MULTIVITAMIN WITH MINERALS) tablet Take 1 tablet by mouth daily.     Yes [provider]  olmesartan (BENICAR) 40 MG tablet Take 40 mg by mouth daily.  10/23/19  Yes [provider]  omeprazole (PRILOSEC) 40 MG capsule Take 1 capsule (40 mg total) by mouth in the morning and at bedtime. 02/12/20  Yes Montez Morita, Quillian Quince, MD  ondansetron (ZOFRAN ODT) 8 MG disintegrating tablet 8mg  ODT q4 hours prn nausea 02/10/20  Yes Delo, Nathaneil Canary, MD  oxyCODONE-acetaminophen (PERCOCET/ROXICET) 5-325 MG tablet Take 1 tablet by mouth every 4 (four) hours as needed. 04/16/17  Yes Oswald Hillock, MD  tacrolimus  (PROGRAF) 1 MG capsule Take 0.5 mg by mouth 2 (two) times daily.    Yes [provider]    Physical Exam: Vitals:   02/12/20 2030 02/12/20 2102 02/13/20 0018  BP:   (!) 154/88  Pulse: 68  75  Resp:   18  Temp: 97.8 F (36.6 C)  97.8 F (36.6 C)  TempSrc: Oral  Oral  SpO2: (!) 87%  98%  Weight:  114 kg   Height:  5\' 9"  (1.753 m)     Constitutional: NAD, calm, comfortable Eyes: PERRL, lids and conjunctivae normal ENMT: Mucous membranes are moist. Posterior pharynx clear of any exudate or lesions. Neck: normal, supple, no masses, no thyromegaly Respiratory: Poor inspiratory effort with decreased breath sounds in bases., no wheezing, no crackles. No accessory muscle use.  Cardiovascular: Regular rate and rhythm, no murmurs / rubs / gallops. No extremity edema. 2+ pedal pulses. No carotid bruits.  Abdomen: Obese, nondistended.  Soft, no tenderness, no masses palpated. No hepatosplenomegaly. Bowel sounds positive.  Musculoskeletal: no clubbing / cyanosis. Good ROM, no contractures. Normal muscle tone.  Skin: no rashes, lesions, ulcers on very limited dermatological exam. Neurologic: Somnolent.  Unable to fully evaluate.   Psychiatric: Somnolent.  Oriented to name and knows he is in the hospital. Labs on Admission: I have personally reviewed following labs and imaging studies  CBC: Recent Labs  Lab 02/10/20 0501 02/12/20 1241  WBC 8.3 10.0  NEUTROABS 6.0 7.7  HGB 14.0 13.2  HCT 43.7 42.7  MCV 101.4* 104.1*  PLT 191 536    Basic Metabolic Panel: Recent Labs  Lab 02/10/20 0501 02/12/20 1241  NA 142 141  K 3.8 4.0  CL 108 107  CO2 24 27  GLUCOSE 165* 174*  BUN 29* 41*  CREATININE 1.30* 1.44*  CALCIUM 8.8* 8.7*    GFR: Estimated Creatinine Clearance: 52.6 mL/min (A) (by C-G formula based on SCr of 1.44 mg/dL (H)).  Liver Function Tests: Recent Labs  Lab 02/10/20 0501  AST 21  ALT 18  ALKPHOS 58  BILITOT 0.9  PROT 7.0  ALBUMIN 3.5   Radiological  Exams on Admission: DG Chest 1 View  Result Date: 02/12/2020 CLINICAL DATA:  Patient feels like something stuck in the esophagus and cannot eat or drink anything. Recent endoscopy. Negative COVID test. EXAM: CHEST  1 VIEW COMPARISON:  02/10/2020 FINDINGS: Shallow inspiration with elevation of the right hemidiaphragm. Mild cardiac enlargement. No vascular congestion or edema. Atelectasis in the lung bases. Calcification of the aorta. Degenerative changes in the spine. IMPRESSION: Shallow inspiration with atelectasis in the lung bases. Electronically Signed   By: Lucienne Capers M.D.   On: 02/12/2020 21:41    EKG: Independently reviewed.   Assessment/Plan Principal Problem:   Hypoxia Due to poor inspiratory effort. Postanesthesia sedation? Previous hypoxic episodes. Observation/telemetry. Continue supplemental oxygen. Incentive spirometry. May need home oxygen.  Active Problems:   Dysphagia Continue liquid diet. Continue Protonix 40 mg IVPB every 12 hours.    Hypothyroidism Continue levothyroxine 137 mcg p.o. daily.    Type 2 diabetes mellitus with stage 3 chronic kidney disease, with long-term current use of insulin (HCC) Hold Lantus until patient is eating regularly. CBG monitoring with R ISS.    Mixed hyperlipidemia Not on medical therapy.    Essential hypertension, benign Continue losartan 40 mg p.o. daily.    DVT prophylaxis: SCDs. Code Status:   Full code. Family Communication: Disposition Plan:   Patient is from:  Home.  Anticipated DC to:  Home.  Anticipated DC date:  02/13/2020 or 02/14/2020.  Anticipated DC barriers: Clinical status.  Consults called: Admission status:  Observation/telemetry.  Severity of Illness:  High given recent procedure producing sedation and hypoxia.  Reubin Milan MD [facility]  How to contact the [facility] Attending or Consulting provider [place] or covering provider during after hours [place], for this patient?    1. Check the care team in [facility] and look for a) attending/consulting [facility] provider listed and b) the [facility] team listed 2. Log into [email or web address] and use [place]'s universal password to access. If you do not have the password, please contact the hospital operator. 3. Locate the [facility] provider you are looking for under [facility] and page to a number that you can be directly reached. 4. If you still have difficulty reaching the provider, please page the [facility] (Director on Call) for the Hospitalists listed on amion for assistance.  02/13/2020, 3:53 AM   This document was prepared using Dragon voice  recognition software and may contain some unintended transcription errors.

## 2020-02-13 NOTE — Progress Notes (Signed)
Nsg Discharge Note  Admit Date:  02/12/2020 Discharge date: 02/13/2020   Danae Orleans to be D/C'd Home per MD order.  AVS completed.  Copy for chart, and copy for patient signed, and dated. Patient/caregiver able to verbalize understanding.  Discharge Medication: Allergies as of 02/13/2020      Reactions   Contrast Media [iodinated Diagnostic Agents] Rash      Medication List    STOP taking these medications   omeprazole 40 MG capsule Commonly known as: PRILOSEC     TAKE these medications   aspirin 325 MG tablet Take 1 tablet (325 mg total) by mouth daily.   docusate sodium 100 MG capsule Commonly known as: COLACE Take 100 mg by mouth at bedtime.   guaiFENesin 600 MG 12 hr tablet Commonly known as: Mucinex Take 1 tablet (600 mg total) by mouth 2 (two) times daily.   Lantus SoloStar 100 UNIT/ML Solostar Pen Generic drug: insulin glargine Inject 34 Units into the skin daily at 10 pm.   levothyroxine 137 MCG tablet Commonly known as: SYNTHROID Take 1 tablet (137 mcg total) by mouth daily before breakfast.   multivitamin with minerals tablet Take 1 tablet by mouth daily.   NovoLOG FlexPen 100 UNIT/ML FlexPen Generic drug: insulin aspart Inject 12-18 Units into the skin 3 (three) times daily with meals. Give per sliding scale three times a day   olmesartan 40 MG tablet Commonly known as: BENICAR Take 40 mg by mouth daily.   ondansetron 8 MG disintegrating tablet Commonly known as: Zofran ODT 8mg  ODT q4 hours prn nausea   oxyCODONE-acetaminophen 5-325 MG tablet Commonly known as: PERCOCET/ROXICET Take 1 tablet by mouth every 4 (four) hours as needed.   pantoprazole 40 MG tablet Commonly known as: Protonix Take 1 tablet (40 mg total) by mouth 2 (two) times daily before a meal.   tacrolimus 1 MG capsule Commonly known as: PROGRAF Take 0.5 mg by mouth 2 (two) times daily.       Discharge Assessment: Vitals:   02/13/20 0505 02/13/20 0823  BP: (!) 103/58  136/69  Pulse: 76 70  Resp: 18 20  Temp: 98.6 F (37 C) 98.1 F (36.7 C)  SpO2: 96% 97%   Skin clean, dry and intact without evidence of skin break down, no evidence of skin tears noted. IV catheter discontinued intact. Site without signs and symptoms of complications - no redness or edema noted at insertion site, patient denies c/o pain - only slight tenderness at site.  Dressing with slight pressure applied.  D/c Instructions-Education: Discharge instructions given to patient/family with verbalized understanding. D/c education completed with patient/family including follow up instructions, medication list, d/c activities limitations if indicated, with other d/c instructions as indicated by MD - patient able to verbalize understanding, all questions fully answered. Patient instructed to return to ED, call 911, or call MD for any changes in condition.  Patient escorted via [place], and D/C home via private auto.  Loa Socks, RN 02/13/2020 3:49 PM

## 2020-02-13 NOTE — Discharge Summary (Signed)
Physician Discharge Summary  [name] UXN:[id number] DOB: [birth year] DOA: 02/12/2020  PCP: Sharilyn Sites, MD  Admit date: 02/12/2020 Discharge date: 02/13/2020  Admitted From: Home Disposition: Home  Recommendations for Outpatient Follow-up:  1. Follow up with PCP in 1-2 weeks 2. Please obtain BMP/CBC in one week 3. Follow-up with gastroenterology in the next 4 to 6 weeks for repeat EGD  Discharge Condition: Stable CODE STATUS: Full code Diet recommendation: Soft diet  Brief/Interim Summary: 79 year old male with a history of diabetes, Karlene Lineman cirrhosis with history of liver transplant on Prograf, hypothyroidism, was seen in the emergency room for food impaction in esophagus.  He underwent EGD with removal of food impaction, but postprocedure developed hypoxia.  It was not felt that it was safe to discharge the patient home.  Chest x-ray did not show any acute findings such as pneumonia, it did note atelectasis.  Patient was noted to have poor respiratory effort.  It was felt that anesthesia may have been impacting his respiratory status.  Patient was monitored in the hospital overnight.  Respiratory status has since stabilized.  Oxygen was removed the following day and oxygen saturations at rest and on ambulation were noted to be above 90%.  Patient denies any shortness of breath at this time.  During EGD, he was noted to have some esophageal ulcers around food impaction.  Biopsy was taken with recommendations for twice daily PPI for the next 8 weeks.  He is to continue on a soft diet.  At this point, he appears to be near baseline and is felt stable for discharge.  Will follow up with GI as an outpatient.  Discharge Diagnoses:  Principal Problem:   Hypoxia Active Problems:   Hypothyroidism   Type 2 diabetes mellitus with stage 3 chronic kidney disease, with long-term current use of insulin (HCC)   Mixed hyperlipidemia   Essential hypertension, benign   Dysphagia    Discharge  Instructions  Discharge Instructions    Diet - low sodium heart healthy   Complete by: As directed    Increase activity slowly   Complete by: As directed      Allergies as of 02/13/2020      Reactions   Contrast Media [iodinated Diagnostic Agents] Rash      Medication List    STOP taking these medications   omeprazole 40 MG capsule Commonly known as: PRILOSEC     TAKE these medications   aspirin 325 MG tablet Take 1 tablet (325 mg total) by mouth daily.   docusate sodium 100 MG capsule Commonly known as: COLACE Take 100 mg by mouth at bedtime.   guaiFENesin 600 MG 12 hr tablet Commonly known as: Mucinex Take 1 tablet (600 mg total) by mouth 2 (two) times daily.   Lantus SoloStar 100 UNIT/ML Solostar Pen Generic drug: insulin glargine Inject 34 Units into the skin daily at 10 pm.   levothyroxine 137 MCG tablet Commonly known as: SYNTHROID Take 1 tablet (137 mcg total) by mouth daily before breakfast.   multivitamin with minerals tablet Take 1 tablet by mouth daily.   NovoLOG FlexPen 100 UNIT/ML FlexPen Generic drug: insulin aspart Inject 12-18 Units into the skin 3 (three) times daily with meals. Give per sliding scale three times a day   olmesartan 40 MG tablet Commonly known as: BENICAR Take 40 mg by mouth daily.   ondansetron 8 MG disintegrating tablet Commonly known as: Zofran ODT 8mg  ODT q4 hours prn nausea   oxyCODONE-acetaminophen 5-325 MG tablet Commonly  known as: PERCOCET/ROXICET Take 1 tablet by mouth every 4 (four) hours as needed.   pantoprazole 40 MG tablet Commonly known as: Protonix Take 1 tablet (40 mg total) by mouth 2 (two) times daily before a meal.   tacrolimus 1 MG capsule Commonly known as: PROGRAF Take 0.5 mg by mouth 2 (two) times daily.       Follow-up Information    Montez Morita, Quillian Quince, MD. Schedule an appointment as soon as possible for a visit in 4 week(s).   Specialty: Gastroenterology Contact information: [address] [place] [address] [phone]        Sharilyn Sites, MD. Schedule an appointment as soon as possible for a visit in 1 week(s).   Specialty: Family Medicine Contact information: [address] [place] [address] [phone]              Allergies  Allergen Reactions  . Contrast Media [Iodinated Diagnostic Agents] Rash    Consultations:  Gastroenterology   Procedures/Studies: DG Chest 1 View  Result Date: 02/12/2020 CLINICAL DATA:  Patient feels like something stuck in the esophagus and cannot eat or drink anything. Recent endoscopy. Negative COVID test. EXAM: CHEST  1 VIEW COMPARISON:  02/10/2020 FINDINGS: Shallow inspiration with elevation of the right hemidiaphragm. Mild cardiac enlargement. No vascular congestion or edema. Atelectasis in the lung bases. Calcification of the aorta. Degenerative changes in the spine. IMPRESSION: Shallow inspiration with atelectasis in the lung bases. Electronically Signed   By: Lucienne Capers M.D.   On: 02/12/2020 21:41   DG Chest Port 1 View  Result Date: 02/10/2020 CLINICAL DATA:  Cough, shortness of breath, nausea, vomiting, and diarrhea. History of hypertension and diabetes. EXAM: PORTABLE CHEST 1 VIEW COMPARISON:  03/18/2011 FINDINGS: Shallow inspiration. Atelectasis in the right base. No focal consolidation. No pleural effusions. Mediastinal contours appear intact. Normal heart size and pulmonary vascularity. IMPRESSION: Shallow inspiration with atelectasis in the right base. Electronically Signed   By: Lucienne Capers M.D.   On: 02/10/2020 05:04       Subjective: Denies any significant complaints.  No shortness of breath or cough at this time.  Discharge Exam: Vitals:   02/13/20 0018 02/13/20 0113 02/13/20 0505 02/13/20 0823  BP: (!) 154/88  (!) 103/58 136/69  Pulse: 75  76 70  Resp: 18  18 20   Temp: 97.8 F (36.6 C)  98.6 F (37 C) 98.1 F (36.7 C)  TempSrc: Oral  Oral Oral  SpO2:  98%  96% 97%  Weight:  115.9 kg    Height:  5\' 9"  (1.753 m)      General: Pt is alert, awake, not in acute distress Cardiovascular: RRR, S1/S2 +, no rubs, no gallops Respiratory: CTA bilaterally, no wheezing, no rhonchi Abdominal: Soft, NT, ND, bowel sounds + Extremities: no edema, no cyanosis    The results of significant diagnostics from this hospitalization (including imaging, microbiology, ancillary and laboratory) are listed below for reference.     Microbiology: Recent Results (from the past 240 hour(s))  SARS Coronavirus 2 by RT PCR (hospital order, performed in [facility] hospital lab) Nasopharyngeal Nasopharyngeal Swab     Status: None   Collection Time: 02/10/20  5:05 AM   Specimen: Nasopharyngeal Swab  Result Value Ref Range Status   SARS Coronavirus 2 NEGATIVE NEGATIVE Final    Comment: (NOTE) SARS-CoV-2 target nucleic acids are NOT DETECTED.  The SARS-CoV-2 RNA is generally detectable in upper and lower respiratory specimens during the acute phase  of infection. The lowest concentration of SARS-CoV-2 viral copies this assay can detect is 250 copies / mL. A negative result does not preclude SARS-CoV-2 infection and should not be used as the sole basis for treatment or other patient management decisions.  A negative result may occur with improper specimen collection / handling, submission of specimen other than nasopharyngeal swab, presence of viral mutation(s) within the areas targeted by this assay, and inadequate number of viral copies (<250 copies / mL). A negative result must be combined with clinical observations, patient history, and epidemiological information.  Fact Sheet for Patients:   StrictlyIdeas.no  Fact Sheet for Healthcare Providers: BankingDealers.co.za  This test is not yet approved or  cleared by the [place] FDA and has been authorized for detection and/or diagnosis of SARS-CoV-2 by FDA  under an Emergency Use Authorization (EUA).  This EUA will remain in effect (meaning this test can be used) for the duration of the COVID-19 declaration under Section 564(b)(1) of the Act, 21 U.S.C. section 360bbb-3(b)(1), unless the authorization is terminated or revoked sooner.  Performed at [facility], [address], [place], [place] [address]   SARS Coronavirus 2 by RT PCR (hospital order, performed in [facility] hospital lab) Nasopharyngeal Nasopharyngeal Swab     Status: None   Collection Time: 02/12/20  1:38 PM   Specimen: Nasopharyngeal Swab  Result Value Ref Range Status   SARS Coronavirus 2 NEGATIVE NEGATIVE Final    Comment: (NOTE) SARS-CoV-2 target nucleic acids are NOT DETECTED.  The SARS-CoV-2 RNA is generally detectable in upper and lower respiratory specimens during the acute phase of infection. The lowest concentration of SARS-CoV-2 viral copies this assay can detect is 250 copies / mL. A negative result does not preclude SARS-CoV-2 infection and should not be used as the sole basis for treatment or other patient management decisions.  A negative result may occur with improper specimen collection / handling, submission of specimen other than nasopharyngeal swab, presence of viral mutation(s) within the areas targeted by this assay, and inadequate number of viral copies (<250 copies / mL). A negative result must be combined with clinical observations, patient history, and epidemiological information.  Fact Sheet for Patients:   StrictlyIdeas.no  Fact Sheet for Healthcare Providers: BankingDealers.co.za  This test is not yet approved or  cleared by the [place] FDA and has been authorized for detection and/or diagnosis of SARS-CoV-2 by FDA under an Emergency Use Authorization (EUA).  This EUA will remain in effect (meaning this test can be used) for the duration of the COVID-19 declaration under Section  564(b)(1) of the Act, 21 U.S.C. section 360bbb-3(b)(1), unless the authorization is terminated or revoked sooner.  Performed at [facility], [address], [place], [place] [address]      Labs: BNP (last 3 results) Recent Labs    02/10/20 0501  BNP 07.3   Basic Metabolic Panel: Recent Labs  Lab 02/10/20 0501 02/12/20 1241  NA 142 141  K 3.8 4.0  CL 108 107  CO2 24 27  GLUCOSE 165* 174*  BUN 29* 41*  CREATININE 1.30* 1.44*  CALCIUM 8.8* 8.7*   Liver Function Tests: Recent Labs  Lab 02/10/20 0501  AST 21  ALT 18  ALKPHOS 58  BILITOT 0.9  PROT 7.0  ALBUMIN 3.5   No results for input(s): LIPASE, AMYLASE in the last 168 hours. No results for input(s): AMMONIA in the last 168 hours. CBC: Recent Labs  Lab 02/10/20 0501 02/12/20 1241  WBC 8.3  10.0  NEUTROABS 6.0 7.7  HGB 14.0 13.2  HCT 43.7 42.7  MCV 101.4* 104.1*  PLT 191 193   Cardiac Enzymes: No results for input(s): CKTOTAL, CKMB, CKMBINDEX, TROPONINI in the last 168 hours. BNP: Invalid input(s): POCBNP CBG: Recent Labs  Lab 02/12/20 1854 02/12/20 2053 02/13/20 0111 02/13/20 0753 02/13/20 1123  GLUCAP 110* 157* 204* 167* 213*   D-Dimer No results for input(s): DDIMER in the last 72 hours. Hgb A1c No results for input(s): HGBA1C in the last 72 hours. Lipid Profile No results for input(s): CHOL, HDL, LDLCALC, TRIG, CHOLHDL, LDLDIRECT in the last 72 hours. Thyroid function studies No results for input(s): TSH, T4TOTAL, T3FREE, THYROIDAB in the last 72 hours.  Invalid input(s): FREET3 Anemia work up No results for input(s): VITAMINB12, FOLATE, FERRITIN, TIBC, IRON, RETICCTPCT in the last 72 hours. Urinalysis    Component Value Date/Time   COLORURINE YELLOW 04/12/2017 1635   APPEARANCEUR CLEAR 04/12/2017 1635   LABSPEC 1.016 04/12/2017 1635   PHURINE 6.0 04/12/2017 1635   GLUCOSEU NEGATIVE 04/12/2017 1635   HGBUR SMALL (A) 04/12/2017 1635   BILIRUBINUR NEGATIVE 04/12/2017 1635    KETONESUR NEGATIVE 04/12/2017 1635   PROTEINUR NEGATIVE 04/12/2017 1635   UROBILINOGEN 0.2 03/18/2011 1400   NITRITE NEGATIVE 04/12/2017 1635   LEUKOCYTESUR NEGATIVE 04/12/2017 1635   Sepsis Labs Invalid input(s): PROCALCITONIN,  WBC,  LACTICIDVEN Microbiology Recent Results (from the past 240 hour(s))  SARS Coronavirus 2 by RT PCR (hospital order, performed in [facility] hospital lab) Nasopharyngeal Nasopharyngeal Swab     Status: None   Collection Time: 02/10/20  5:05 AM   Specimen: Nasopharyngeal Swab  Result Value Ref Range Status   SARS Coronavirus 2 NEGATIVE NEGATIVE Final    Comment: (NOTE) SARS-CoV-2 target nucleic acids are NOT DETECTED.  The SARS-CoV-2 RNA is generally detectable in upper and lower respiratory specimens during the acute phase of infection. The lowest concentration of SARS-CoV-2 viral copies this assay can detect is 250 copies / mL. A negative result does not preclude SARS-CoV-2 infection and should not be used as the sole basis for treatment or other patient management decisions.  A negative result may occur with improper specimen collection / handling, submission of specimen other than nasopharyngeal swab, presence of viral mutation(s) within the areas targeted by this assay, and inadequate number of viral copies (<250 copies / mL). A negative result must be combined with clinical observations, patient history, and epidemiological information.  Fact Sheet for Patients:   StrictlyIdeas.no  Fact Sheet for Healthcare Providers: BankingDealers.co.za  This test is not yet approved or  cleared by the [place] FDA and has been authorized for detection and/or diagnosis of SARS-CoV-2 by FDA under an Emergency Use Authorization (EUA).  This EUA will remain in effect (meaning this test can be used) for the duration of the COVID-19 declaration under Section 564(b)(1) of the Act, 21 U.S.C. section  360bbb-3(b)(1), unless the authorization is terminated or revoked sooner.  Performed at [facility], [address] [place] [address], [place], [place] [address]   SARS Coronavirus 2 by RT PCR (hospital order, performed in [facility] hospital lab) Nasopharyngeal Nasopharyngeal Swab     Status: None   Collection Time: 02/12/20  1:38 PM   Specimen: Nasopharyngeal Swab  Result Value Ref Range Status   SARS Coronavirus 2 NEGATIVE NEGATIVE Final    Comment: (NOTE) SARS-CoV-2 target nucleic acids are NOT DETECTED.  The SARS-CoV-2 RNA is generally detectable in upper and lower respiratory specimens during the acute phase  of infection. The lowest concentration of SARS-CoV-2 viral copies this assay can detect is 250 copies / mL. A negative result does not preclude SARS-CoV-2 infection and should not be used as the sole basis for treatment or other patient management decisions.  A negative result may occur with improper specimen collection / handling, submission of specimen other than nasopharyngeal swab, presence of viral mutation(s) within the areas targeted by this assay, and inadequate number of viral copies (<250 copies / mL). A negative result must be combined with clinical observations, patient history, and epidemiological information.  Fact Sheet for Patients:   StrictlyIdeas.no  Fact Sheet for Healthcare Providers: BankingDealers.co.za  This test is not yet approved or  cleared by the [place] FDA and has been authorized for detection and/or diagnosis of SARS-CoV-2 by FDA under an Emergency Use Authorization (EUA).  This EUA will remain in effect (meaning this test can be used) for the duration of the COVID-19 declaration under Section 564(b)(1) of the Act, 21 U.S.C. section 360bbb-3(b)(1), unless the authorization is terminated or revoked sooner.  Performed at [facility], [address] [place], [place] [address]      Time coordinating  discharge: 44mins  SIGNED:   Kathie Dike, MD  [facility] 02/13/2020, 9:05 PM   If 7PM-7AM, please contact night-coverage [email or web address]

## 2020-02-15 ENCOUNTER — Telehealth: Payer: Self-pay | Admitting: *Deleted

## 2020-02-15 LAB — GLUCOSE, CAPILLARY: Glucose-Capillary: 159 mg/dL — ABNORMAL HIGH (ref 70–99)

## 2020-02-15 NOTE — Telephone Encounter (Addendum)
Pt wants results from recent procedure. [phone]

## 2020-02-15 NOTE — Telephone Encounter (Signed)
Pt's results haven't resulted. Will discuss with pts daughter when they come in.

## 2020-02-16 LAB — SURGICAL PATHOLOGY

## 2020-02-17 ENCOUNTER — Encounter (HOSPITAL_COMMUNITY): Payer: Self-pay | Admitting: Gastroenterology

## 2020-03-11 DIAGNOSIS — Z6838 Body mass index (BMI) 38.0-38.9, adult: Secondary | ICD-10-CM | POA: Diagnosis not present

## 2020-03-11 DIAGNOSIS — Z944 Liver transplant status: Secondary | ICD-10-CM | POA: Diagnosis not present

## 2020-03-11 DIAGNOSIS — I639 Cerebral infarction, unspecified: Secondary | ICD-10-CM | POA: Diagnosis not present

## 2020-03-11 DIAGNOSIS — I10 Essential (primary) hypertension: Secondary | ICD-10-CM | POA: Diagnosis not present

## 2020-03-11 DIAGNOSIS — E7849 Other hyperlipidemia: Secondary | ICD-10-CM | POA: Diagnosis not present

## 2020-03-16 ENCOUNTER — Ambulatory Visit (INDEPENDENT_AMBULATORY_CARE_PROVIDER_SITE_OTHER): Payer: PPO | Admitting: Nurse Practitioner

## 2020-03-16 ENCOUNTER — Encounter: Payer: Self-pay | Admitting: Nurse Practitioner

## 2020-03-16 ENCOUNTER — Other Ambulatory Visit: Payer: Self-pay

## 2020-03-16 VITALS — BP 158/93 | HR 77 | Ht 69.5 in | Wt 256.4 lb

## 2020-03-16 DIAGNOSIS — E039 Hypothyroidism, unspecified: Secondary | ICD-10-CM

## 2020-03-16 DIAGNOSIS — Z794 Long term (current) use of insulin: Secondary | ICD-10-CM | POA: Diagnosis not present

## 2020-03-16 DIAGNOSIS — N1831 Chronic kidney disease, stage 3a: Secondary | ICD-10-CM | POA: Diagnosis not present

## 2020-03-16 DIAGNOSIS — E782 Mixed hyperlipidemia: Secondary | ICD-10-CM

## 2020-03-16 DIAGNOSIS — E1121 Type 2 diabetes mellitus with diabetic nephropathy: Secondary | ICD-10-CM

## 2020-03-16 DIAGNOSIS — I10 Essential (primary) hypertension: Secondary | ICD-10-CM

## 2020-03-16 LAB — POCT GLYCOSYLATED HEMOGLOBIN (HGB A1C): Hemoglobin A1C: 6.7 % — AB (ref 4.0–5.6)

## 2020-03-16 MED ORDER — LEVOTHYROXINE SODIUM 137 MCG PO TABS: 137.0000 ug | ORAL_TABLET | Freq: Every day | ORAL | 6 refills | Status: DC

## 2020-03-16 MED ORDER — NOVOLOG FLEXPEN 100 UNIT/ML ~~LOC~~ SOPN: 12.0000 [IU] | PEN_INJECTOR | Freq: Three times a day (TID) | SUBCUTANEOUS | 3 refills | Status: DC

## 2020-03-16 NOTE — Progress Notes (Addendum)
03/16/2020   Endocrinology Follow Up Visit      Subjective:    Patient ID: [name], male    DOB: [birth year], PCP Sharilyn Sites, MD   Past Medical History:  Diagnosis Date  . Anemia   . Biliary calculi, common bile duct   . CRI (chronic renal insufficiency)   . DM (diabetes mellitus) ([place])    Type II  . Elevated liver enzymes   . Esophageal varices ([place]) 03/19/11   mrcp  . History of ERCP 03/21/2011   Extrahepatic biliary obstruction secondary to occluded biliary stent and colon no cholelithiasis, status post sphincterotomy, stent removal and replacement and stone extraction.  Marland Kitchen HTN (hypertension)   . Hypothyroidism   . NASH (nonalcoholic steatohepatitis) 2006   liver transplant  . Pancreatitis chronic 03/19/11   mrcp  . S/P colonoscopy with polypectomy 2004   Dr Gala Romney  . S/P endoscopy 03/2004   Hx esophgaeal varices, pyloric channel ulcer prior to transplant  . Squamous cell cancer of external ear    pinna   Past Surgical History:  Procedure Laterality Date  . BILE DUCT STENT PLACEMENT  03/21/11   Dr. Gala Romney  . BIOPSY  02/12/2020   Procedure: BIOPSY;  Surgeon: Harvel Quale, MD;  Location: [facility];  Service: Gastroenterology;;  . [place]  . COLONOSCOPY  08/12/2002   Dr. Gala Romney- somewhat diffusely friable rectum and colon, polyp at 30 cm.  . COLONOSCOPY N/A 09/08/2012   Procedure: COLONOSCOPY;  Surgeon: Daneil Dolin, MD;  colonic diverticulosis. Repeat in 10 years.   Marland Kitchen ERCP  03/21/2011   Dr. Gala Romney- ERCP with removal of pediatric feeding tube, endoscopic sphinecterotomy with balloon dilation of ampulla followed by stone extraction and stent placement  . ESOPHAGOGASTRODUODENOSCOPY  03/20/2004   Dr. Nash Mantis 2 esophageal varices o/w normal esophageal mucosa, mild changes of snake skin in the gastric mucosa consistent with portal gastropathy, multiple antral erosions  and  a single pyloric channel ulcer benign appearance o/w normal gastric  mucosa, normal D1 and S2  . ESOPHAGOGASTRODUODENOSCOPY (EGD) WITH PROPOFOL N/A 02/12/2020   Procedure: ESOPHAGOGASTRODUODENOSCOPY (EGD) WITH PROPOFOL;  Surgeon: Harvel Quale, MD;  Location: [facility];  Service: Gastroenterology;  Laterality: N/A;  . KNEE ARTHROSCOPY WITH EXCISION PLICA Left 7/82/9562   Procedure: KNEE ARTHROSCOPY WITH EXCISION PLICA;  Surgeon: Carole Civil, MD;  Location: [facility];  Service: Orthopedics;  Laterality: Left;  . KNEE ARTHROSCOPY WITH MEDIAL MENISECTOMY Left 11/21/2012   Procedure: KNEE ARTHROSCOPY WITH MEDIAL AND LATERAL MENISECTOMY;  Surgeon: Carole Civil, MD;  Location: [facility];  Service: Orthopedics;  Laterality: Left;  . LIVER TRANSPLANT  2006   UVA-NASH cirrhosis  . MOUTH SURGERY  may 2012  . MRCP  03/19/11   biliary ductal dilatation- stones. esophageal varices  . SPHINCTEROTOMY  03/21/2011   Procedure: SPHINCTEROTOMY;  Surgeon: Daneil Dolin, MD;  Location: [facility];  Service: Gastroenterology;;   Social History   Socioeconomic History  . Marital status: Married    Spouse name: Not on file  . Number of children: 1  . Years of education: 72  . Highest education level: Not on file  Occupational History  . Occupation: RETIRED    Employer: RETIRED  Tobacco Use  . Smoking status: Never Smoker  . Smokeless tobacco: Never Used  Vaping Use  . Vaping Use: Never used  Substance and Sexual Activity  . Alcohol use: No  . Drug use: No  . Sexual activity:  Not on file  Other Topics Concern  . Not on file  Social History Narrative  . Not on file   Social Determinants of Health   Financial Resource Strain:   . Difficulty of Paying Living Expenses: Not on file  Food Insecurity:   . Worried About Charity fundraiser in the Last Year: Not on file  . Ran Out of Food in the Last Year: Not on file  Transportation Needs:   . Lack of Transportation (Medical): Not on file  . Lack of Transportation (Non-Medical): Not on file  Physical  Activity:   . Days of Exercise per Week: Not on file  . Minutes of Exercise per Session: Not on file  Stress:   . Feeling of Stress : Not on file  Social Connections:   . Frequency of Communication with Friends and Family: Not on file  . Frequency of Social Gatherings with Friends and Family: Not on file  . Attends Religious Services: Not on file  . Active Member of Clubs or Organizations: Not on file  . Attends Archivist Meetings: Not on file  . Marital Status: Not on file   Outpatient Encounter Medications as of 03/16/2020  Medication Sig  . aspirin 325 MG tablet Take 1 tablet (325 mg total) by mouth daily.  Marland Kitchen docusate sodium (COLACE) 100 MG capsule Take 100 mg by mouth at bedtime.   . insulin aspart (NOVOLOG FLEXPEN) 100 UNIT/ML FlexPen Inject 12-15 Units into the skin 3 (three) times daily with meals. Give per sliding scale three times a day  . Insulin Glargine (LANTUS SOLOSTAR) 100 UNIT/ML Solostar Pen Inject 34 Units into the skin daily at 10 pm.  . levothyroxine (SYNTHROID) 137 MCG tablet Take 1 tablet (137 mcg total) by mouth daily before breakfast.  . Multiple Vitamins-Minerals (MULTIVITAMIN WITH MINERALS) tablet Take 1 tablet by mouth daily.    Marland Kitchen olmesartan (BENICAR) 40 MG tablet Take 40 mg by mouth daily.   Marland Kitchen omeprazole (PRILOSEC) 40 MG capsule SMARTSIG:1 Capsule(s) By Mouth Morning-Night  . oxyCODONE-acetaminophen (PERCOCET/ROXICET) 5-325 MG tablet Take 1 tablet by mouth every 4 (four) hours as needed.  . tacrolimus (PROGRAF) 1 MG capsule Take 0.5 mg by mouth 2 (two) times daily.   . [DISCONTINUED] insulin aspart (NOVOLOG FLEXPEN) 100 UNIT/ML FlexPen Inject 12-18 Units into the skin 3 (three) times daily with meals. Give per sliding scale three times a day  . [DISCONTINUED] levothyroxine (SYNTHROID) 137 MCG tablet Take 1 tablet (137 mcg total) by mouth daily before breakfast.  . [DISCONTINUED] pantoprazole (PROTONIX) 40 MG tablet Take 1 tablet (40 mg total) by mouth 2  (two) times daily before a meal.  . [DISCONTINUED] guaiFENesin (MUCINEX) 600 MG 12 hr tablet Take 1 tablet (600 mg total) by mouth 2 (two) times daily.  . [DISCONTINUED] ondansetron (ZOFRAN ODT) 8 MG disintegrating tablet 8mg  ODT q4 hours prn nausea   No facility-administered encounter medications on file as of 03/16/2020.   ALLERGIES: Allergies  Allergen Reactions  . Contrast Media [Iodinated Diagnostic Agents] Rash   VACCINATION STATUS: Immunization History  Administered Date(s) Administered  . Influenza Split 02/13/2011, 04/29/2012, 04/20/2013, 05/09/2013  . Influenza, High Dose Seasonal PF 04/26/2015, 04/13/2017  . Influenza-Unspecified 04/08/2013  . Moderna SARS-COVID-2 Vaccination 07/30/2019, 09/04/2019  . Pneumococcal Conjugate-13 07/29/2014  . Pneumococcal Polysaccharide-23 03/17/2010, 04/16/2011    Diabetes He presents for his follow-up diabetic visit. He has type 2 diabetes mellitus. Onset time: He was diagnosed at approximate age of 58 years. His  disease course has been stable. There are no hypoglycemic associated symptoms. Pertinent negatives for hypoglycemia include no confusion, headaches, pallor, seizures or tremors. There are no diabetic associated symptoms. Pertinent negatives for diabetes include no chest pain, no fatigue, no polydipsia, no polyphagia, no polyuria and no weakness. There are no hypoglycemic complications. Symptoms are stable. Diabetic complications include a CVA. Risk factors for coronary artery disease include diabetes mellitus, dyslipidemia, hypertension, male sex, obesity, sedentary lifestyle and tobacco exposure. Current diabetic treatment includes intensive insulin program. He is compliant with treatment most of the time. His weight is fluctuating minimally. He is following a generally unhealthy diet. When asked about meal planning, he reported none. He rarely participates in exercise. His home blood glucose trend is fluctuating minimally. His breakfast  blood glucose range is generally 110-130 mg/dl. His lunch blood glucose range is generally 130-140 mg/dl. His dinner blood glucose range is generally 110-130 mg/dl. His bedtime blood glucose range is generally 140-180 mg/dl. (He presents today with his logs showing improving, yet still above target fasting glycemic profile and near target postprandial glycemic profile.  His POCT A1C today is 6.7%, improving from 6.9% on last visit.  He denies any major episodes of hypoglycemia.) An ACE inhibitor/angiotensin II receptor blocker is being taken. He does not see a podiatrist.Eye exam is current.  Hyperlipidemia This is a chronic problem. The current episode started more than 1 year ago. The problem is uncontrolled. Recent lipid tests were reviewed and are high. Exacerbating diseases include diabetes, hypothyroidism, liver disease and obesity. s/p liver transplant. Factors aggravating his hyperlipidemia include fatty foods. Pertinent negatives include no chest pain, myalgias or shortness of breath. He is currently on no antihyperlipidemic treatment. Compliance problems include adherence to diet.  Risk factors for coronary artery disease include dyslipidemia, diabetes mellitus, hypertension, male sex, obesity and a sedentary lifestyle.  Hypertension This is a chronic problem. The current episode started more than 1 year ago. The problem has been gradually improving since onset. The problem is uncontrolled. Pertinent negatives include no chest pain, headaches, neck pain, palpitations or shortness of breath. Agents associated with hypertension include thyroid hormones. Risk factors for coronary artery disease include dyslipidemia, family history, obesity, male gender, sedentary lifestyle and diabetes mellitus. Past treatments include angiotensin blockers. The current treatment provides mild improvement. Hypertensive end-organ damage includes CVA. He is status post liver transplant.. Identifiable causes of hypertension  include a thyroid problem.  Thyroid Problem Presents for follow-up visit. Patient reports no cold intolerance, constipation, diarrhea, fatigue, heat intolerance, leg swelling, palpitations or tremors. The symptoms have been stable. Past treatments include levothyroxine. The following procedures have not been performed: thyroidectomy. His past medical history is significant for diabetes and hyperlipidemia.    Review of systems  Constitutional: + Minimally fluctuating body weight,  current  Body mass index is 37.32 kg/m. , no fatigue, no subjective hyperthermia, no subjective hypothermia Eyes: no blurry vision, no xerophthalmia ENT: no sore throat, no nodules palpated in throat, no dysphagia/odynophagia, no hoarseness Cardiovascular: no Chest Pain, no Shortness of Breath, no palpitations, no leg swelling Respiratory: no cough, no shortness of breath Gastrointestinal: no Nausea/Vomiting/Diarhhea Musculoskeletal: no muscle/joint aches Skin: no rashes, no hyperemia Neurological: no tremors, no numbness, no tingling, no dizziness Psychiatric: no depression, no anxiety    Objective:    BP (!) 158/93 (BP Location: Left Arm, Patient Position: Sitting)   Pulse 77   Ht 5' 9.5" (1.765 m)   Wt 256 lb 6.4 oz (116.3 kg)   BMI 37.32  kg/m   Wt Readings from Last 3 Encounters:  03/16/20 256 lb 6.4 oz (116.3 kg)  02/13/20 255 lb 8.2 oz (115.9 kg)  02/12/20 251 lb 5.2 oz (114 kg)    BP Readings from Last 3 Encounters:  03/16/20 (!) 158/93  02/13/20 136/69  02/13/20 (!) 159/80    Physical Exam- Limited  Constitutional:  Body mass index is 37.32 kg/m. , not in acute distress, normal state of mind Eyes:  EOMI, no exophthalmos Neck: Supple Thyroid: No gross goiter Cardiovascular: RRR, no murmers, rubs, or gallops, no edema Respiratory: Adequate breathing efforts, no crackles, rales, rhonchi, or wheezing Musculoskeletal: no gross deformities, strength intact in all four extremities, no gross  restriction of joint movements Skin:  no rashes, no hyperemia Neurological: no tremor with outstretched hands  Results for orders placed or performed in visit on 03/16/20  HgB A1c  Result Value Ref Range   Hemoglobin A1C 6.7 (A) 4.0 - 5.6 %   HbA1c POC (<> result, manual entry)     HbA1c, POC (prediabetic range)     HbA1c, POC (controlled diabetic range)     Diabetic Labs (most recent): Lab Results  Component Value Date   HGBA1C 6.7 (A) 03/16/2020   HGBA1C 6.9 11/09/2019   HGBA1C 6.8 (A) 07/13/2019   Lipid Panel     Component Value Date/Time   CHOL 131 08/12/2017 0000   TRIG 113 08/12/2017 0000   HDL 35 08/12/2017 0000   CHOLHDL 4.3 04/13/2017 0547   VLDL 21 04/13/2017 0547   LDLCALC 73 08/12/2017 0000     Assessment & Plan:   1. Uncontrolled type 2 diabetes mellitus with complication, with CKD GFR of 46, CVA - - He remains at a high risk for more acute and chronic complications of diabetes which include CAD, CVA, CKD, retinopathy, and neuropathy. These are all discussed in detail with the patient.  He presents today with his logs showing improving, yet still above target fasting glycemic profile and near target postprandial glycemic profile.  His POCT A1C today is 6.7%, improving from 6.9% on last visit.  He denies any major episodes of hypoglycemia.      Glucose logs and insulin administration records pertaining to this visit,  to be scanned into patient's records.  Recent labs reviewed.  - The patient admits there is a room for improvement in their diet and drink choices. -  Suggestion is made for the patient to avoid simple carbohydrates from their diet including Cakes, Sweet Desserts / Pastries, Ice Cream, Soda (diet and regular), Sweet Tea, Candies, Chips, Cookies, Sweet Pastries,  Store Bought Juices, Alcohol in Excess of  1-2 drinks a day, Artificial Sweeteners, Coffee Creamer, and "Sugar-free" Products. This will help patient to have stable blood glucose profile and  potentially avoid unintended weight gain.   - I encouraged the patient to switch to  unprocessed or minimally processed complex starch and increased protein intake (animal or plant source), fruits, and vegetables.   - Patient is advised to stick to a routine mealtimes to eat 3 meals  a day and avoid unnecessary snacks ( to snack only to correct hypoglycemia).  - I have approached patient with the following individualized plan to manage diabetes and patient agrees.  -He will continue to require intensive treatment with basal/bolus insulin in order for him to maintain control of diabetes to target. -Based on stable glycemic profile, he is advised to continue Lantus 34 units nightly, continue NovoLog 12-15 units 3 times a day  before meals if glucose above 90 and eating.  Specific instructions on titrating insulin dose based on blood glucose given to patient in writing. - He would have benefited  from continuous glucose monitoring, however he decided not to get it because he cannot afford the monthly co-pay.    -He is encouraged to continue monitoring blood glucose 4 times per day, before meals and at bedtime and report glucose levels less than 70 or greater than 200 for 3 tests in a row.  -He is not a candidate for metformin, SGLT2 inhibitors, nor incretin in therapy due to history of liver transplant.  2) BP/HTN:  His blood pressure is not controlled to target.  He had not long taken his medication prior to his visit with [place] today.  He is advised to continue his Benicar 40 mg po daily.  He is advised to monitor blood pressure at home and report if readings are consistently above 150/90 to his PCP.  3) Lipids/HPL:  His lipid panel from 08/12/2017 reviewed showing controlled LDL at 73.  He is not currently on statin medications as a result of his liver transplant status.  Will consider rechecking lipid panel prior to next visit.   4)  Weight/Diet:   His Body mass index is 37.32 kg/m.- clearly  impacting his diabetes management.  Exercise and carbohydrates information provided.  5) Primary hypothyroidism: His TFTs from 02/09/2019 reviewed.  No recent lab to review.  He is advised to continue levothyroxine 137 mcg po daily before breakfast.  Will recheck thyroid function tests prior to next visit and adjust dosage if appropriate.   - We discussed about the correct intake of his thyroid hormone, on empty stomach at fasting, with water, separated by at least 30 minutes from breakfast and other medications,  and separated by more than 4 hours from calcium, iron, multivitamins, acid reflux medications (PPIs). -Patient is made aware of the fact that thyroid hormone replacement is needed for life, dose to be adjusted by periodic monitoring of thyroid function tests.  6) Chronic Care/Health Maintenance: -Patient is on ACEI/ARB medications and encouraged to continue to follow up with Ophthalmology, Podiatrist at least yearly or according to recommendations, and advised to  stay away from smoking. I have recommended yearly flu vaccine and pneumonia vaccination at least every 5 years; and  sleep for at least 7 hours a day.  - I advised patient to maintain close follow up with Sharilyn Sites, MD for primary care needs.  - Time spent on this patient care encounter:  35 min, of which > 50% was spent in  counseling and the rest reviewing his blood glucose logs , discussing his hypoglycemia and hyperglycemia episodes, reviewing his current and  previous labs / studies  ( including abstraction from other facilities) and medications  doses and developing a  long term treatment plan and documenting his care.   Please refer to Patient Instructions for Blood Glucose Monitoring and Insulin/Medications Dosing Guide"  in media tab for additional information. Please  also refer to " Patient Self Inventory" in the Media  tab for reviewed elements of pertinent patient history.  Danae Orleans participated in the  discussions, expressed understanding, and voiced agreement with the above plans.  All questions were answered to his satisfaction. he is encouraged to contact clinic should he have any questions or concerns prior to his return visit.   Follow up plan: -Return in about 4 months (around 07/16/2020) for Diabetes follow up, Thyroid follow up, Previsit  labs, Virtual visit ok.  Rayetta Pigg, FNP-BC [facility] Phone: [phone] Fax: [phone]  03/16/2020, 10:19 AM

## 2020-03-16 NOTE — Patient Instructions (Signed)

## 2020-04-05 NOTE — Progress Notes (Signed)
Referring Provider: Sharilyn Sites, MD Primary Care Physician:  Sharilyn Sites, MD Primary GI Physician: Dr. Gala Romney  Chief Complaint  Patient presents with  . Dysphagia    had food impaction removed last month. Has not had any recent episodes    HPI:   [name] is a 79 y.o. male with history of orthotopic liver transplantation for Nash/cirrhosis in 2006. History of choledocholithiasis in 2012 s/p ERCP with sphincterotomy and stent biliary stent replacement. Last saw [facility] for transplant follow-up in April 2021 and was doing very well with plans to follow-up in 1 year.  He is up-to-date for colon cancer screening with last colonoscopy in 2014 revealing colonic diverticulosis with recommendations to repeat in 10 years.  He is presenting today for follow-up of dysphagia.  He was last seen in office 02/12/2020.  Reported inability to tolerate p.o. intake since 8/2 after eating a hot dog which she felt got hung in his esophagus.  Prior to onset of this severe episode, he had been having 2 weeks of intermittent dysphagia.  Denies GERD, nausea, or abdominal pain.  Additionally, he reported new onset of loose stools x5 days.  Overall, the symptoms are very mild and suspected to be secondary to diet as well as not taking any of his medications due to inability to tolerate p.o.  Suspected BMs are likely returned to normal once he is able to resume eating and take his medications.  Due to inability to tolerate p.o., he was sent to the emergency room as he likely needed urgent EGD.  Patient presented to the emergency room and underwent emergent EGD 02/12/2020 with Dr. Jenetta Downer.  Food impaction in the middle third of the esophagus s/p removal, three superficial esophageal ulcerations with no stigmata of recent bleeding, one cratered nonnecrotic esophageal ulcer with no stigmata of recent bleed in the mid esophagus s/p biopsied.  Recommended PPI twice daily x2 months and repeat EGD in 4-6 weeks. Path with  necrosis and inflamed, reactive squamous epithelium  consistent with ulcer.    Unfortunately, the patient had persistent O2 desaturation in postop recovery area with O2 in the mid 80s requiring oxygen by [place].  He was admitted under medicine team for further evaluation.  Chest x-ray did not show any acute findings such as pneumonia, I did note atelectasis.  It was felt the anesthesia was impacting his respiratory status.  He was monitored in the hospital overnight and respiratory status stabilized.  Oxygen was removed and O2 sats remained above 90% at rest and on ambulation.  Today: Spicy food will feel they get hung at times. More like it is slow moving. He has slowed down and started chewing thoroughly which has helped.   Taking omeprazole BID.   No GERD symptoms. No abdominal pain. No nausea or vomiting.   Had to lay off the stool softener. 1 mushy BM daily. No blood in the stool or black stool.   Per daughter, patient chronically has a low O2 saturation, usually in the 94 range.   Past Medical History:  Diagnosis Date  . Anemia   . Biliary calculi, common bile duct   . CRI (chronic renal insufficiency)   . DM (diabetes mellitus) ([place])    Type II  . Elevated liver enzymes   . Esophageal varices ([place]) 03/19/11   mrcp  . History of ERCP 03/21/2011   Extrahepatic biliary obstruction secondary to occluded biliary stent and colon no cholelithiasis, status post sphincterotomy, stent removal and replacement and stone  extraction.  Marland Kitchen HTN (hypertension)   . Hypothyroidism   . NASH (nonalcoholic steatohepatitis) 2006   liver transplant  . Pancreatitis chronic 03/19/11   mrcp  . S/P colonoscopy with polypectomy 2004   Dr Gala Romney  . S/P endoscopy 03/2004   Hx esophgaeal varices, pyloric channel ulcer prior to transplant  . Squamous cell cancer of external ear    pinna    Past Surgical History:  Procedure Laterality Date  . BILE DUCT STENT PLACEMENT  03/21/11   Dr. Gala Romney  . BIOPSY  02/12/2020     Procedure: BIOPSY;  Surgeon: Harvel Quale, MD;  Location: [facility];  Service: Gastroenterology;;  . [place]ene Dales  . COLONOSCOPY  08/12/2002   Dr. Gala Romney- somewhat diffusely friable rectum and colon, polyp at 30 cm.  . COLONOSCOPY N/A 09/08/2012   Procedure: COLONOSCOPY;  Surgeon: Daneil Dolin, MD;  colonic diverticulosis. Repeat in 10 years.   Marland Kitchen ERCP  03/21/2011   Dr. Gala Romney- ERCP with removal of pediatric feeding tube, endoscopic sphinecterotomy with balloon dilation of ampulla followed by stone extraction and stent placement  . ESOPHAGOGASTRODUODENOSCOPY  03/20/2004   Dr. Nash Mantis 2 esophageal varices o/w normal esophageal mucosa, mild changes of snake skin in the gastric mucosa consistent with portal gastropathy, multiple antral erosions  and  a single pyloric channel ulcer benign appearance o/w normal gastric mucosa, normal D1 and S2  . ESOPHAGOGASTRODUODENOSCOPY (EGD) WITH PROPOFOL N/A 02/12/2020   Procedure: ESOPHAGOGASTRODUODENOSCOPY (EGD) WITH PROPOFOL;  Surgeon: Montez Morita, Quillian Quince, MD; Food impaction in the middle third of the esophagus s/p removal, three superficial esophageal ulcerations with no stigmata of recent bleeding, one cratered nonnecrotic esophageal ulcer with no stigmata of recent bleed in the mid esophagus s/p biopsied. Path consistet with ulcer.   Marland Kitchen KNEE ARTHROSCOPY WITH EXCISION PLICA Left 0/97/3532   Procedure: KNEE ARTHROSCOPY WITH EXCISION PLICA;  Surgeon: Carole Civil, MD;  Location: [facility];  Service: Orthopedics;  Laterality: Left;  . KNEE ARTHROSCOPY WITH MEDIAL MENISECTOMY Left 11/21/2012   Procedure: KNEE ARTHROSCOPY WITH MEDIAL AND LATERAL MENISECTOMY;  Surgeon: Carole Civil, MD;  Location: [facility];  Service: Orthopedics;  Laterality: Left;  . LIVER TRANSPLANT  2006   [facility] cirrhosis  . MOUTH SURGERY  may 2012  . MRCP  03/19/11   biliary ductal dilatation- stones. esophageal varices  . SPHINCTEROTOMY  03/21/2011    Procedure: SPHINCTEROTOMY;  Surgeon: Daneil Dolin, MD;  Location: [facility];  Service: Gastroenterology;;    Current Outpatient Medications  Medication Sig Dispense Refill  . aspirin 325 MG tablet Take 1 tablet (325 mg total) by mouth daily. 30 tablet 2  . docusate sodium (COLACE) 100 MG capsule Take 100 mg by mouth daily as needed.     . insulin aspart (NOVOLOG FLEXPEN) 100 UNIT/ML FlexPen Inject 12-15 Units into the skin 3 (three) times daily with meals. Give per sliding scale three times a day 30 mL 3  . Insulin Glargine (LANTUS SOLOSTAR) 100 UNIT/ML Solostar Pen Inject 34 Units into the skin daily at 10 pm. 45 mL 1  . levothyroxine (SYNTHROID) 137 MCG tablet Take 1 tablet (137 mcg total) by mouth daily before breakfast. 30 tablet 6  . MAGNESIUM PO Take 133 mg by mouth.    . Multiple Vitamins-Minerals (MULTIVITAMIN WITH MINERALS) tablet Take 1 tablet by mouth daily.      Marland Kitchen olmesartan (BENICAR) 40 MG tablet Take 40 mg by mouth daily.     Marland Kitchen omeprazole (PRILOSEC) 40  MG capsule SMARTSIG:1 Capsule(s) By Mouth Morning-Night    . tacrolimus (PROGRAF) 1 MG capsule Take 0.5 mg by mouth 2 (two) times daily.      No current facility-administered medications for this visit.    Allergies as of 04/06/2020 - Review Complete 04/06/2020  Allergen Reaction Noted  . Contrast media [iodinated diagnostic agents] Rash 03/27/2011    Family History  Problem Relation Age of Onset  . Cirrhosis Father        nash  . Lymphoma Mother   . Diabetes Daughter     Social History   Socioeconomic History  . Marital status: Married    Spouse name: Not on file  . Number of children: 1  . Years of education: 20  . Highest education level: Not on file  Occupational History  . Occupation: RETIRED    Employer: RETIRED  Tobacco Use  . Smoking status: Never Smoker  . Smokeless tobacco: Never Used  Vaping Use  . Vaping Use: Never used  Substance and Sexual Activity  . Alcohol use: No  . Drug use: No  .  Sexual activity: Not on file  Other Topics Concern  . Not on file  Social History Narrative  . Not on file   Social Determinants of Health   Financial Resource Strain:   . Difficulty of Paying Living Expenses: Not on file  Food Insecurity:   . Worried About Charity fundraiser in the Last Year: Not on file  . Ran Out of Food in the Last Year: Not on file  Transportation Needs:   . Lack of Transportation (Medical): Not on file  . Lack of Transportation (Non-Medical): Not on file  Physical Activity:   . Days of Exercise per Week: Not on file  . Minutes of Exercise per Session: Not on file  Stress:   . Feeling of Stress : Not on file  Social Connections:   . Frequency of Communication with Friends and Family: Not on file  . Frequency of Social Gatherings with Friends and Family: Not on file  . Attends Religious Services: Not on file  . Active Member of Clubs or Organizations: Not on file  . Attends Archivist Meetings: Not on file  . Marital Status: Not on file    Review of Systems: Gen: Denies fever, chills, cold or flu like symptoms, pre-syncope, or syncope.  CV: Denies chest pain or palpitations Resp: Chronic SOB with exertion. No SOB at rest. Intermittent cough.  GI: See HPI  Heme: See HPI  Physical Exam: BP (!) 160/84   Pulse 74   Temp (!) 97.5 F (36.4 C) (Oral)   Ht 5\' 10"  (1.778 m)   Wt 259 lb 9.6 oz (117.8 kg)   BMI 37.25 kg/m  General:   Alert and oriented. No distress noted. Pleasant and cooperative.  Head:  Normocephalic and atraumatic. Eyes:  Conjuctiva clear without scleral icterus. Heart:  S1, S2 present without murmurs appreciated. Lungs:  Clear to auscultation bilaterally. No wheezes, rales, or rhonchi. No distress.  Abdomen:  +BS, soft, non-tender and non-distended. No rebound or guarding. No HSM or masses noted. Msk:  Symmetrical without gross deformities. Normal posture. Extremities:  Without edema. Neurologic:  Alert and  oriented  x4 Psych:  Normal mood and affect.

## 2020-04-06 ENCOUNTER — Other Ambulatory Visit: Payer: Self-pay

## 2020-04-06 ENCOUNTER — Ambulatory Visit (INDEPENDENT_AMBULATORY_CARE_PROVIDER_SITE_OTHER): Payer: PPO | Admitting: Gastroenterology

## 2020-04-06 ENCOUNTER — Encounter: Payer: Self-pay | Admitting: Gastroenterology

## 2020-04-06 VITALS — BP 160/84 | HR 74 | Temp 97.5°F | Ht 70.0 in | Wt 259.6 lb

## 2020-04-06 DIAGNOSIS — R1310 Dysphagia, unspecified: Secondary | ICD-10-CM | POA: Diagnosis not present

## 2020-04-06 NOTE — Patient Instructions (Signed)
We will be scheduled for an upper endoscopy with possible dilation of your esophagus in the near future with Dr. Gala Romney. 1 day prior to procedure: Take one half dose of Lantus (17 units) the evening before your procedure.  You may continue your sliding scale insulin as needed. Day of your procedure: Do not take any morning diabetes medications.  Continue taking omeprazole 40 mg 30 minutes before breakfast and dinner.  Be sure you are eating slowly, taking small bites, chewing thoroughly, and drinking plenty of liquids throughout your meals.  Should something get hung in her esophagus and not come up or go down, you should proceed to the emergency room.  We will follow up with you after your procedure.  Do not hesitate to call if you have questions or concerns prior.  Aliene Altes, PA-C [facility]

## 2020-04-07 ENCOUNTER — Ambulatory Visit: Payer: PPO | Admitting: Gastroenterology

## 2020-04-08 ENCOUNTER — Encounter: Payer: Self-pay | Admitting: Gastroenterology

## 2020-04-08 NOTE — Assessment & Plan Note (Addendum)
79 y.o. male with recent food impaction requiring EGD on 02/12/20. Notably, the food had been lodged in his esophagus 4 days prior to presenting to our office for evaluation. He was sent to the emergency room for emergent EGD. EGD revealed Food impaction in the middle third of the esophagus s/p removal, three superficial esophageal ulcerations with no stigmata of recent bleeding, one cratered nonnecrotic esophageal ulcer with no stigmata of recent bleed in the mid esophagus s/p biopsied.  Path with necrosis and inflamed, reactive squamous epithelium  consistent with ulcer.  He was started on omeprazole BID and advised to repeat EGD in 4-6 weeks.   He continues on PPI BID. Occasionally feels foods moving down his esophagus slowly but no foods getting hung since EGD. He has started eating slowly and chewing well which has helped. No other significant GI symptoms. We will plan to repeat EGD to evaluate for healing and to possibly dilate his esophagus.   Plan:  1. Proceed with EGD +/- dilation with propofol with Dr. Gala Romney in the near future. The risks, benefits, and alternatives have been discussed with the patient in detail. The patient states understanding and desires to proceed.  ASA III 2. Continue taking omeprazole 40 mg 30 minutes before breakfast and dinner for now.  3. Eat slowly, take small bites, chew thoroughly, and drink plenty of liquids throughout your meals. 4. Advised if something were to get hung in his esophagus and not come up or go down, he should proceed to the emergency room.  *Notably, after patient's last EGD, he had had persistent O2 desaturation in postop recovery area with O2 in the mid 80s requiring oxygen by [place]. He was admitted over night, CXR negative. It was felt the anesthesia was impacting his respiratory status. Respiratory status stabilized overnight, O2 was removed, and sats remained above 90% at rest and on ambulation.

## 2020-04-25 ENCOUNTER — Other Ambulatory Visit: Payer: Self-pay | Admitting: "Endocrinology

## 2020-04-26 DIAGNOSIS — Z23 Encounter for immunization: Secondary | ICD-10-CM | POA: Diagnosis not present

## 2020-04-28 ENCOUNTER — Telehealth: Payer: Self-pay | Admitting: *Deleted

## 2020-04-28 NOTE — Telephone Encounter (Signed)
Called spoke with pt daughter Jacqlyn Larsen. Patient has been scheduled for EGD/DIL with propofol, Dr. Gala Romney, asa 3 on 07/18/20 at 7:30am. Aware will need pre-op/covid test prior. Advised will mail prep instructions with this appt. Confirmed mailing address.

## 2020-04-29 ENCOUNTER — Encounter: Payer: Self-pay | Admitting: *Deleted

## 2020-05-05 DIAGNOSIS — H25043 Posterior subcapsular polar age-related cataract, bilateral: Secondary | ICD-10-CM | POA: Diagnosis not present

## 2020-05-05 DIAGNOSIS — H25013 Cortical age-related cataract, bilateral: Secondary | ICD-10-CM | POA: Diagnosis not present

## 2020-05-05 DIAGNOSIS — H18413 Arcus senilis, bilateral: Secondary | ICD-10-CM | POA: Diagnosis not present

## 2020-05-05 DIAGNOSIS — H40013 Open angle with borderline findings, low risk, bilateral: Secondary | ICD-10-CM | POA: Diagnosis not present

## 2020-05-05 DIAGNOSIS — H2512 Age-related nuclear cataract, left eye: Secondary | ICD-10-CM | POA: Diagnosis not present

## 2020-05-05 DIAGNOSIS — H2513 Age-related nuclear cataract, bilateral: Secondary | ICD-10-CM | POA: Diagnosis not present

## 2020-05-10 ENCOUNTER — Encounter: Payer: Self-pay | Admitting: *Deleted

## 2020-05-10 ENCOUNTER — Telehealth: Payer: Self-pay | Admitting: *Deleted

## 2020-05-10 NOTE — Telephone Encounter (Signed)
Patient scheduled for procedure 07/18/2020 and we need to r/s.  Called pt and spoke with daughter. He has been rescheduled to 2/14 at 7:30am. Aware will mail new instructions.

## 2020-05-16 DIAGNOSIS — N1831 Chronic kidney disease, stage 3a: Secondary | ICD-10-CM | POA: Diagnosis not present

## 2020-05-16 DIAGNOSIS — E1121 Type 2 diabetes mellitus with diabetic nephropathy: Secondary | ICD-10-CM | POA: Diagnosis not present

## 2020-05-16 DIAGNOSIS — E039 Hypothyroidism, unspecified: Secondary | ICD-10-CM | POA: Diagnosis not present

## 2020-05-16 DIAGNOSIS — Z944 Liver transplant status: Secondary | ICD-10-CM | POA: Diagnosis not present

## 2020-05-16 DIAGNOSIS — Z794 Long term (current) use of insulin: Secondary | ICD-10-CM | POA: Diagnosis not present

## 2020-05-17 LAB — LIPID PANEL
Chol/HDL Ratio: 3.7 ratio (ref 0.0–5.0)
Cholesterol, Total: 144 mg/dL (ref 100–199)
HDL: 39 mg/dL — ABNORMAL LOW (ref >39)
LDL Chol Calc (NIH): 87 mg/dL (ref 0–99)
Triglycerides: 96 mg/dL (ref 0–149)
VLDL Cholesterol Cal: 18 mg/dL (ref 5–40)

## 2020-05-17 LAB — COMPREHENSIVE METABOLIC PANEL
ALT: 13 IU/L (ref 0–44)
AST: 16 IU/L (ref 0–40)
Albumin/Globulin Ratio: 1.4 (ref 1.2–2.2)
Albumin: 4 g/dL (ref 3.7–4.7)
Alkaline Phosphatase: 84 IU/L (ref 44–121)
BUN/Creatinine Ratio: 16 (ref 10–24)
BUN: 21 mg/dL (ref 8–27)
Bilirubin Total: 0.6 mg/dL (ref 0.0–1.2)
CO2: 25 mmol/L (ref 20–29)
Calcium: 9.2 mg/dL (ref 8.6–10.2)
Chloride: 102 mmol/L (ref 96–106)
Creatinine, Ser: 1.34 mg/dL — ABNORMAL HIGH (ref 0.76–1.27)
GFR calc Af Amer: 58 mL/min/{1.73_m2} — ABNORMAL LOW (ref >59)
GFR calc non Af Amer: 50 mL/min/{1.73_m2} — ABNORMAL LOW (ref >59)
Globulin, Total: 2.9 g/dL (ref 1.5–4.5)
Glucose: 107 mg/dL — ABNORMAL HIGH (ref 65–99)
Potassium: 4.2 mmol/L (ref 3.5–5.2)
Sodium: 139 mmol/L (ref 134–144)
Total Protein: 6.9 g/dL (ref 6.0–8.5)

## 2020-05-17 LAB — MICROALBUMIN / CREATININE URINE RATIO
Creatinine, Urine: 288.9 mg/dL
Microalb/Creat Ratio: 180 mg/g creat — ABNORMAL HIGH (ref 0–29)
Microalbumin, Urine: 519.3 ug/mL

## 2020-05-17 LAB — T4, FREE: Free T4: 0.86 ng/dL (ref 0.82–1.77)

## 2020-05-17 LAB — TSH: TSH: 6.55 u[IU]/mL — ABNORMAL HIGH (ref 0.450–4.500)

## 2020-05-17 LAB — HEMOGLOBIN A1C
Est. average glucose Bld gHb Est-mCnc: 151 mg/dL
Hgb A1c MFr Bld: 6.9 % — ABNORMAL HIGH (ref 4.8–5.6)

## 2020-05-17 LAB — VITAMIN D 25 HYDROXY (VIT D DEFICIENCY, FRACTURES): Vit D, 25-Hydroxy: 33.7 ng/mL (ref 30.0–100.0)

## 2020-05-17 NOTE — Progress Notes (Signed)
No ma'am, your response was perfect!  Thank you!

## 2020-05-18 DIAGNOSIS — H2512 Age-related nuclear cataract, left eye: Secondary | ICD-10-CM | POA: Diagnosis not present

## 2020-05-19 DIAGNOSIS — H2511 Age-related nuclear cataract, right eye: Secondary | ICD-10-CM | POA: Diagnosis not present

## 2020-06-01 ENCOUNTER — Telehealth: Payer: Self-pay

## 2020-06-01 NOTE — Telephone Encounter (Signed)
Called pt to see if he wanted to move EGD/DIL to December. Pt wants to keep procedure as scheduled d/t having cataract surgery in December.

## 2020-06-15 DIAGNOSIS — H2511 Age-related nuclear cataract, right eye: Secondary | ICD-10-CM | POA: Diagnosis not present

## 2020-07-12 ENCOUNTER — Telehealth: Payer: Self-pay | Admitting: "Endocrinology

## 2020-07-12 NOTE — Telephone Encounter (Signed)
It will have to be okay as insurance will not cover.

## 2020-07-12 NOTE — Telephone Encounter (Signed)
Called pt to remind them to do their labs next week. Pt states he did them in November. Is this okay to still see pt. I do not think insurance will pay for another set of labs within 3 months.

## 2020-07-15 ENCOUNTER — Other Ambulatory Visit (HOSPITAL_COMMUNITY): Payer: PPO

## 2020-07-19 ENCOUNTER — Other Ambulatory Visit: Payer: Self-pay

## 2020-07-19 ENCOUNTER — Encounter: Payer: Self-pay | Admitting: Nurse Practitioner

## 2020-07-19 ENCOUNTER — Ambulatory Visit (INDEPENDENT_AMBULATORY_CARE_PROVIDER_SITE_OTHER): Payer: PPO | Admitting: Nurse Practitioner

## 2020-07-19 VITALS — BP 146/87 | HR 83 | Ht 70.0 in | Wt 264.2 lb

## 2020-07-19 DIAGNOSIS — Z794 Long term (current) use of insulin: Secondary | ICD-10-CM

## 2020-07-19 DIAGNOSIS — E1122 Type 2 diabetes mellitus with diabetic chronic kidney disease: Secondary | ICD-10-CM | POA: Diagnosis not present

## 2020-07-19 DIAGNOSIS — I10 Essential (primary) hypertension: Secondary | ICD-10-CM

## 2020-07-19 DIAGNOSIS — E782 Mixed hyperlipidemia: Secondary | ICD-10-CM | POA: Diagnosis not present

## 2020-07-19 DIAGNOSIS — N1831 Chronic kidney disease, stage 3a: Secondary | ICD-10-CM | POA: Diagnosis not present

## 2020-07-19 DIAGNOSIS — E039 Hypothyroidism, unspecified: Secondary | ICD-10-CM | POA: Diagnosis not present

## 2020-07-19 DIAGNOSIS — E1129 Type 2 diabetes mellitus with other diabetic kidney complication: Secondary | ICD-10-CM | POA: Diagnosis not present

## 2020-07-19 DIAGNOSIS — E7849 Other hyperlipidemia: Secondary | ICD-10-CM | POA: Diagnosis not present

## 2020-07-19 MED ORDER — NOVOLOG FLEXPEN 100 UNIT/ML ~~LOC~~ SOPN
12.0000 [IU] | PEN_INJECTOR | Freq: Three times a day (TID) | SUBCUTANEOUS | 3 refills | Status: DC
Start: 2020-07-19 — End: 2021-07-05

## 2020-07-19 MED ORDER — LANTUS SOLOSTAR 100 UNIT/ML ~~LOC~~ SOPN: 34.0000 [IU] | PEN_INJECTOR | Freq: Every day | SUBCUTANEOUS | 1 refills | Status: DC

## 2020-07-19 NOTE — Progress Notes (Signed)
07/19/2020   Endocrinology Follow Up Visit      Subjective:    Patient ID: [name], male    DOB: August 10, 1940, PCP Sharilyn Sites, MD   Past Medical History:  Diagnosis Date  . Anemia   . Biliary calculi, common bile duct   . CRI (chronic renal insufficiency)   . DM (diabetes mellitus) ([place])    Type II  . Elevated liver enzymes   . Esophageal varices ([place]) 03/19/11   mrcp  . History of ERCP 03/21/2011   Extrahepatic biliary obstruction secondary to occluded biliary stent and colon no cholelithiasis, status post sphincterotomy, stent removal and replacement and stone extraction.  Marland Kitchen HTN (hypertension)   . Hypothyroidism   . NASH (nonalcoholic steatohepatitis) 2006   liver transplant  . Pancreatitis chronic 03/19/11   mrcp  . S/P colonoscopy with polypectomy 2004   Dr Gala Romney  . S/P endoscopy 03/2004   Hx esophgaeal varices, pyloric channel ulcer prior to transplant  . Squamous cell cancer of external ear    pinna   Past Surgical History:  Procedure Laterality Date  . BILE DUCT STENT PLACEMENT  03/21/11   Dr. Gala Romney  . BIOPSY  02/12/2020   Procedure: BIOPSY;  Surgeon: Harvel Quale, MD;  Location: [facility];  Service: Gastroenterology;;  . Wallene Dales  . COLONOSCOPY  08/12/2002   Dr. Gala Romney- somewhat diffusely friable rectum and colon, polyp at 30 cm.  . COLONOSCOPY N/A 09/08/2012   Procedure: COLONOSCOPY;  Surgeon: Daneil Dolin, MD;  colonic diverticulosis. Repeat in 10 years.   Marland Kitchen ERCP  03/21/2011   Dr. Gala Romney- ERCP with removal of pediatric feeding tube, endoscopic sphinecterotomy with balloon dilation of ampulla followed by stone extraction and stent placement  . ESOPHAGOGASTRODUODENOSCOPY  03/20/2004   Dr. Nash Mantis 2 esophageal varices o/w normal esophageal mucosa, mild changes of snake skin in the gastric mucosa consistent with portal gastropathy, multiple antral erosions  and  a single pyloric channel ulcer benign appearance o/w normal gastric  mucosa, normal D1 and S2  . ESOPHAGOGASTRODUODENOSCOPY (EGD) WITH PROPOFOL N/A 02/12/2020   Procedure: ESOPHAGOGASTRODUODENOSCOPY (EGD) WITH PROPOFOL;  Surgeon: Montez Morita, Quillian Quince, MD; Food impaction in the middle third of the esophagus s/p removal, three superficial esophageal ulcerations with no stigmata of recent bleeding, one cratered nonnecrotic esophageal ulcer with no stigmata of recent bleed in the mid esophagus s/p biopsied. Path consistet with ulcer.   Marland Kitchen KNEE ARTHROSCOPY WITH EXCISION PLICA Left 2/59/5638   Procedure: KNEE ARTHROSCOPY WITH EXCISION PLICA;  Surgeon: Carole Civil, MD;  Location: [facility];  Service: Orthopedics;  Laterality: Left;  . KNEE ARTHROSCOPY WITH MEDIAL MENISECTOMY Left 11/21/2012   Procedure: KNEE ARTHROSCOPY WITH MEDIAL AND LATERAL MENISECTOMY;  Surgeon: Carole Civil, MD;  Location: [facility];  Service: Orthopedics;  Laterality: Left;  . LIVER TRANSPLANT  2006   UVA-NASH cirrhosis  . MOUTH SURGERY  may 2012  . MRCP  03/19/11   biliary ductal dilatation- stones. esophageal varices  . SPHINCTEROTOMY  03/21/2011   Procedure: SPHINCTEROTOMY;  Surgeon: Daneil Dolin, MD;  Location: [facility];  Service: Gastroenterology;;   Social History   Socioeconomic History  . Marital status: Married    Spouse name: Not on file  . Number of children: 1  . Years of education: 61  . Highest education level: Not on file  Occupational History  . Occupation: RETIRED    Employer: RETIRED  Tobacco Use  . Smoking status: Never Smoker  .  Smokeless tobacco: Never Used  Vaping Use  . Vaping Use: Never used  Substance and Sexual Activity  . Alcohol use: No  . Drug use: No  . Sexual activity: Not on file  Other Topics Concern  . Not on file  Social History Narrative  . Not on file   Social Determinants of Health   Financial Resource Strain: Not on file  Food Insecurity: Not on file  Transportation Needs: Not on file  Physical Activity: Not on file  Stress: Not  on file  Social Connections: Not on file   Outpatient Encounter Medications as of 07/19/2020  Medication Sig  . aspirin 325 MG tablet Take 1 tablet (325 mg total) by mouth daily.  Marland Kitchen docusate sodium (COLACE) 100 MG capsule Take 100 mg by mouth daily as needed.   Marland Kitchen levothyroxine (SYNTHROID) 137 MCG tablet Take 1 tablet (137 mcg total) by mouth daily before breakfast.  . MAGNESIUM PO Take 133 mg by mouth.  . Multiple Vitamins-Minerals (MULTIVITAMIN WITH MINERALS) tablet Take 1 tablet by mouth daily.    Marland Kitchen olmesartan (BENICAR) 40 MG tablet Take 40 mg by mouth daily.   Glory Rosebush ULTRA test strip TESTING FOUR TIMES DAILY.  Marland Kitchen tacrolimus (PROGRAF) 1 MG capsule Take 0.5 mg by mouth 2 (two) times daily.   . [DISCONTINUED] insulin aspart (NOVOLOG FLEXPEN) 100 UNIT/ML FlexPen Inject 12-15 Units into the skin 3 (three) times daily with meals. Give per sliding scale three times a day  . [DISCONTINUED] Insulin Glargine (LANTUS SOLOSTAR) 100 UNIT/ML Solostar Pen Inject 34 Units into the skin daily at 10 pm.  . insulin aspart (NOVOLOG FLEXPEN) 100 UNIT/ML FlexPen Inject 12-15 Units into the skin 3 (three) times daily with meals. Give per sliding scale three times a day  . insulin glargine (LANTUS SOLOSTAR) 100 UNIT/ML Solostar Pen Inject 34 Units into the skin daily at 10 pm.  . [DISCONTINUED] omeprazole (PRILOSEC) 40 MG capsule SMARTSIG:1 Capsule(s) By Mouth Morning-Night   No facility-administered encounter medications on file as of 07/19/2020.   ALLERGIES: Allergies  Allergen Reactions  . Contrast Media [Iodinated Diagnostic Agents] Rash   VACCINATION STATUS: Immunization History  Administered Date(s) Administered  . Influenza Split 02/13/2011, 04/29/2012, 04/20/2013, 05/09/2013  . Influenza, High Dose Seasonal PF 04/26/2015, 04/13/2017  . Influenza-Unspecified 04/08/2013  . Moderna Sars-Covid-2 Vaccination 07/30/2019, 09/04/2019  . Pneumococcal Conjugate-13 07/29/2014  . Pneumococcal  Polysaccharide-23 03/17/2010, 04/16/2011    Diabetes He presents for his follow-up diabetic visit. He has type 2 diabetes mellitus. Onset time: He was diagnosed at approximate age of 18 years. His disease course has been stable. There are no hypoglycemic associated symptoms. Pertinent negatives for hypoglycemia include no confusion, headaches, pallor, seizures or tremors. Pertinent negatives for diabetes include no chest pain, no fatigue, no polydipsia, no polyphagia, no polyuria and no weakness. There are no hypoglycemic complications. Symptoms are stable. Diabetic complications include a CVA. Risk factors for coronary artery disease include diabetes mellitus, dyslipidemia, hypertension, male sex, obesity, sedentary lifestyle and tobacco exposure. Current diabetic treatment includes intensive insulin program. He is compliant with treatment most of the time. His weight is increasing steadily. He is following a generally unhealthy diet. When asked about meal planning, he reported none. He has not had a previous visit with a dietitian. He rarely participates in exercise. His home blood glucose trend is fluctuating minimally. His breakfast blood glucose range is generally 140-180 mg/dl. His lunch blood glucose range is generally 140-180 mg/dl. His dinner blood glucose range is generally  110-130 mg/dl. His bedtime blood glucose range is generally 140-180 mg/dl. His overall blood glucose range is 140-180 mg/dl. (He presents today with his meter and logs showing near target fasting and postprandial glycemic profile.  His most recent A1c was 6.9% on 05/16/20 (not due for another check at this time).  He denies any significant hypoglycemia.  He reports he had a gout flare last month and required oral steroids to treat which caused his glucose readings to increase, but they have since normalized.) An ACE inhibitor/angiotensin II receptor blocker is being taken. He does not see a podiatrist.Eye exam is current.   Hyperlipidemia This is a chronic problem. The current episode started more than 1 year ago. The problem is controlled. Recent lipid tests were reviewed and are normal. Exacerbating diseases include chronic renal disease, diabetes, hypothyroidism, liver disease and obesity. s/p liver transplant. Factors aggravating his hyperlipidemia include fatty foods. Pertinent negatives include no chest pain, myalgias or shortness of breath. He is currently on no antihyperlipidemic treatment. Compliance problems include adherence to diet.  Risk factors for coronary artery disease include dyslipidemia, diabetes mellitus, hypertension, male sex, obesity and a sedentary lifestyle.  Hypertension This is a chronic problem. The current episode started more than 1 year ago. The problem has been gradually improving since onset. The problem is controlled. Pertinent negatives include no chest pain, headaches, neck pain, palpitations or shortness of breath. Agents associated with hypertension include thyroid hormones. Risk factors for coronary artery disease include dyslipidemia, family history, obesity, male gender, sedentary lifestyle and diabetes mellitus. Past treatments include angiotensin blockers. The current treatment provides mild improvement. There are no compliance problems.  Hypertensive end-organ damage includes kidney disease and CVA. He is status post liver transplant.. Identifiable causes of hypertension include chronic renal disease and a thyroid problem.  Thyroid Problem Presents for follow-up visit. Patient reports no cold intolerance, constipation, diarrhea, fatigue, heat intolerance, leg swelling, palpitations or tremors. The symptoms have been stable. Past treatments include levothyroxine. The following procedures have not been performed: thyroidectomy. His past medical history is significant for diabetes and hyperlipidemia.    Review of systems  Constitutional: + steadily increasing body weight,  current  Body mass index is 37.91 kg/m. , no fatigue, no subjective hyperthermia, no subjective hypothermia Eyes: no blurry vision, no xerophthalmia ENT: no sore throat, no nodules palpated in throat, no dysphagia/odynophagia, no hoarseness Cardiovascular: no chest pain, no shortness of breath, no palpitations, no leg swelling Respiratory: no cough, no shortness of breath Gastrointestinal: no nausea/vomiting/diarrhea Musculoskeletal: no muscle/joint aches Skin: no rashes, no hyperemia Neurological: no tremors, no numbness, no tingling, no dizziness Psychiatric: no depression, no anxiety   Objective:    BP (!) 146/87 (BP Location: Left Arm, Patient Position: Sitting)   Pulse 83   Ht 5\' 10"  (1.778 m)   Wt 264 lb 3.2 oz (119.8 kg)   BMI 37.91 kg/m   Wt Readings from Last 3 Encounters:  07/19/20 264 lb 3.2 oz (119.8 kg)  04/06/20 259 lb 9.6 oz (117.8 kg)  03/16/20 256 lb 6.4 oz (116.3 kg)    BP Readings from Last 3 Encounters:  07/19/20 (!) 146/87  04/06/20 (!) 160/84  03/16/20 (!) 158/93    Physical Exam- Limited  Constitutional:  Body mass index is 37.91 kg/m. , not in acute distress, normal state of mind Eyes:  EOMI, no exophthalmos Neck: Supple Cardiovascular: RRR, no murmers, rubs, or gallops, no edema Respiratory: Adequate breathing efforts, no crackles, rales, rhonchi, or wheezing Musculoskeletal: no gross  deformities, strength intact in all four extremities, no gross restriction of joint movements Skin:  no rashes, no hyperemia Neurological: no tremor with outstretched hands  Results for orders placed or performed in visit on 03/16/20  Comprehensive metabolic panel  Result Value Ref Range   Glucose 107 (H) 65 - 99 mg/dL   BUN 21 8 - 27 mg/dL   Creatinine, Ser 1.34 (H) 0.76 - 1.27 mg/dL   GFR calc non Af Amer 50 (L) >59 mL/min/1.73   GFR calc Af Amer 58 (L) >59 mL/min/1.73   BUN/Creatinine Ratio 16 10 - 24   Sodium 139 134 - 144 mmol/L   Potassium 4.2 3.5 - 5.2 mmol/L    Chloride 102 96 - 106 mmol/L   CO2 25 20 - 29 mmol/L   Calcium 9.2 8.6 - 10.2 mg/dL   Total Protein 6.9 6.0 - 8.5 g/dL   Albumin 4.0 3.7 - 4.7 g/dL   Globulin, Total 2.9 1.5 - 4.5 g/dL   Albumin/Globulin Ratio 1.4 1.2 - 2.2   Bilirubin Total 0.6 0.0 - 1.2 mg/dL   Alkaline Phosphatase 84 44 - 121 IU/L   AST 16 0 - 40 IU/L   ALT 13 0 - 44 IU/L  Lipid panel  Result Value Ref Range   Cholesterol, Total 144 100 - 199 mg/dL   Triglycerides 96 0 - 149 mg/dL   HDL 39 (L) >39 mg/dL   VLDL Cholesterol Cal 18 5 - 40 mg/dL   LDL Chol Calc (NIH) 87 0 - 99 mg/dL   Chol/HDL Ratio 3.7 0.0 - 5.0 ratio  Microalbumin / creatinine urine ratio  Result Value Ref Range   Creatinine, Urine 288.9 Not Estab. mg/dL   Microalbumin, Urine 519.3 Not Estab. ug/mL   Microalb/Creat Ratio 180 (H) 0 - 29 mg/g creat  VITAMIN D 25 Hydroxy (Vit-D Deficiency, Fractures)  Result Value Ref Range   Vit D, 25-Hydroxy 33.7 30.0 - 100.0 ng/mL  Hemoglobin A1c  Result Value Ref Range   Hgb A1c MFr Bld 6.9 (H) 4.8 - 5.6 %   Est. average glucose Bld gHb Est-mCnc 151 mg/dL  TSH  Result Value Ref Range   TSH 6.550 (H) 0.450 - 4.500 uIU/mL  T4, free  Result Value Ref Range   Free T4 0.86 0.82 - 1.77 ng/dL  HgB A1c  Result Value Ref Range   Hemoglobin A1C 6.7 (A) 4.0 - 5.6 %   HbA1c POC (<> result, manual entry)     HbA1c, POC (prediabetic range)     HbA1c, POC (controlled diabetic range)     Diabetic Labs (most recent): Lab Results  Component Value Date   HGBA1C 6.9 (H) 05/16/2020   HGBA1C 6.7 (A) 03/16/2020   HGBA1C 6.9 11/09/2019   Lipid Panel     Component Value Date/Time   CHOL 144 05/16/2020 0742   TRIG 96 05/16/2020 0742   HDL 39 (L) 05/16/2020 0742   CHOLHDL 3.7 05/16/2020 0742   CHOLHDL 4.3 04/13/2017 0547   VLDL 21 04/13/2017 0547   LDLCALC 87 05/16/2020 0742     Assessment & Plan:   1) Uncontrolled type 2 diabetes mellitus with complication, with CKD GFR of 46, CVA  - He remains at a  high risk for more acute and chronic complications of diabetes which include CAD, CVA, CKD, retinopathy, and neuropathy. These are all discussed in detail with the patient.  He presents today with his meter and logs showing near target fasting and postprandial glycemic profile.  His  most recent A1c was 6.9% on 05/16/20 (not due for another check at this time).  He denies any significant hypoglycemia.  He reports he had a gout flare last month and required oral steroids to treat which caused his glucose readings to increase, but they have since normalized.     Glucose logs and insulin administration records pertaining to this visit,  to be scanned into patient's records.  Recent labs reviewed.  - Nutritional counseling repeated at each appointment due to patients tendency to fall back in to old habits.  - The patient admits there is a room for improvement in their diet and drink choices. -  Suggestion is made for the patient to avoid simple carbohydrates from their diet including Cakes, Sweet Desserts / Pastries, Ice Cream, Soda (diet and regular), Sweet Tea, Candies, Chips, Cookies, Sweet Pastries,  Store Bought Juices, Alcohol in Excess of  1-2 drinks a day, Artificial Sweeteners, Coffee Creamer, and "Sugar-free" Products. This will help patient to have stable blood glucose profile and potentially avoid unintended weight gain.   - I encouraged the patient to switch to  unprocessed or minimally processed complex starch and increased protein intake (animal or plant source), fruits, and vegetables.   - Patient is advised to stick to a routine mealtimes to eat 3 meals  a day and avoid unnecessary snacks ( to snack only to correct hypoglycemia).  - I have approached patient with the following individualized plan to manage diabetes and patient agrees.  -He will continue to require intensive treatment with basal/bolus insulin in order for him to maintain control of diabetes to target.  -Based on stable  glycemic profile, he is advised to continue Lantus 34 units nightly, continue NovoLog 12-15 units 3 times a day before meals if glucose above 90 and eating.  Specific instructions on titrating insulin dose based on blood glucose readings given to patient in writing.  - He would have benefited from continuous glucose monitoring, however he decided not to get it because he cannot afford the monthly co-pay.    -He is encouraged to continue monitoring blood glucose 4 times per day, before meals and at bedtime and report glucose levels less than 70 or greater than 200 for 3 tests in a row.  -He is not a candidate for metformin, SGLT2 inhibitors, nor incretin in therapy due to history of liver transplant and chronic pancreatitis.  2) BP/HTN:  His blood pressure is controlled to target for his age.  He is advised to continue his Benicar 40 mg po daily.  He is advised to monitor blood pressure at home and report if readings are consistently above 150/90 to his PCP.  3) Lipids/HPL:  His most recent lipid panel from 05/16/20 showed controlled LDL at 87.  He is not currently on statin medications as a result of his liver transplant status.  There is no need to initiate any antihyperlipidemic treatment at this time.  4)  Weight/Diet:   His Body mass index is 37.91 kg/m.- clearly impacting his diabetes management.  Exercise and carbohydrates information provided.  5) Primary hypothyroidism: His previsit thyroid function tests are consistent with slight under-replacement.  He was called at that time and instructed to take his medication properly (as he was taking it with his other medications) which is affecting absorption. He is advised to continue Levothyroxine 137 mcg po daily before breakfast for now.  Will recheck TFT prior to next visit and adjust dose if necessary.   - We discussed about  the correct intake of his thyroid hormone, on empty stomach at fasting, with water, separated by at least 30 minutes  from breakfast and other medications,  and separated by more than 4 hours from calcium, iron, multivitamins, acid reflux medications (PPIs). -Patient is made aware of the fact that thyroid hormone replacement is needed for life, dose to be adjusted by periodic monitoring of thyroid function tests.  6) Chronic Care/Health Maintenance: -Patient is on ACEI/ARB medications and encouraged to continue to follow up with Ophthalmology, Podiatrist at least yearly or according to recommendations, and advised to  stay away from smoking. I have recommended yearly flu vaccine and pneumonia vaccination at least every 5 years; and  sleep for at least 7 hours a day.  - I advised patient to maintain close follow up with Sharilyn Sites, MD for primary care needs.  - Time spent on this patient care encounter:  30 min, of which > 50% was spent in  counseling and the rest reviewing his blood glucose logs , discussing his hypoglycemia and hyperglycemia episodes, reviewing his current and  previous labs / studies  ( including abstraction from other facilities) and medications  doses and developing a  long term treatment plan and documenting his care.   Please refer to Patient Instructions for Blood Glucose Monitoring and Insulin/Medications Dosing Guide"  in media tab for additional information. Please  also refer to " Patient Self Inventory" in the Media  tab for reviewed elements of pertinent patient history.  Danae Orleans participated in the discussions, expressed understanding, and voiced agreement with the above plans.  All questions were answered to his satisfaction. he is encouraged to contact clinic should he have any questions or concerns prior to his return visit.   Follow up plan: -Return for Diabetes follow up, Bring glucometer and logs, Thyroid follow up, Previsit labs.  Rayetta Pigg, [facility] [address] [place], [place] [address] Phone: [phone] Fax:  [phone]  07/19/2020, 10:42 AM

## 2020-07-19 NOTE — Patient Instructions (Signed)
Advice for Weight Management  -For most of us the best way to lose weight is by diet management. Generally speaking, diet management means consuming less calories intentionally which over time brings about progressive weight loss.  This can be achieved more effectively by restricting carbohydrate consumption to the minimum possible.  So, it is critically important to know your numbers: how much calorie you are consuming and how much calorie you need. More importantly, our carbohydrates sources should be unprocessed or minimally processed complex starch food items.   Sometimes, it is important to balance nutrition by increasing protein intake (animal or plant source), fruits, and vegetables.  -Sticking to a routine mealtime to eat 3 meals a day and avoiding unnecessary snacks is shown to have a big role in weight control. Under normal circumstances, the only time we lose real weight is when we are hungry, so allow hunger to take place- hunger means no food between meal times, only water.  It is not advisable to starve.   -It is better to avoid simple carbohydrates including: Cakes, Sweet Desserts, Ice Cream, Soda (diet and regular), Sweet Tea, Candies, Chips, Cookies, Store Bought Juices, Alcohol in Excess of  1-2 drinks a day, Artificial Sweeteners, Doughnuts, Coffee Creamers, "Sugar-free" Products, etc, etc.  This is not a complete list.....    -Consulting with certified diabetes educators is proven to provide you with the most accurate and current information on diet.  Also, you may be  interested in discussing diet options/exchanges , we can schedule a visit with Penny Crumpton, RDN, CDE for individualized nutrition education.  -Exercise: If you are able: 30 -60 minutes a day ,4 days a week, or 150 minutes a week.  The longer the better.  Combine stretch, strength, and aerobic activities.  If you were told in the past that you have high risk for cardiovascular diseases, you may seek evaluation by  your heart doctor prior to initiating moderate to intense exercise programs.      - The correct intake of thyroid hormone (Levothyroxine, Synthroid), is on empty stomach first thing in the morning, with water, separated by at least 30 minutes from breakfast and other medications,  and separated by more than 4 hours from calcium, iron, multivitamins, acid reflux medications (PPIs).  - This medication is a life-long medication and will be needed to correct thyroid hormone imbalances for the rest of your life.  The dose may change from time to time, based on thyroid blood work.  - It is extremely important to be consistent taking this medication, near the same time each morning.  -AVOID TAKING PRODUCTS CONTAINING BIOTIN (commonly found in Hair, Skin, Nails vitamins) AS IT INTERFERES WITH THE VALIDITY OF THYROID FUNCTION BLOOD TESTS.  

## 2020-07-22 DIAGNOSIS — Z6839 Body mass index (BMI) 39.0-39.9, adult: Secondary | ICD-10-CM | POA: Diagnosis not present

## 2020-07-22 DIAGNOSIS — Z23 Encounter for immunization: Secondary | ICD-10-CM | POA: Diagnosis not present

## 2020-07-22 DIAGNOSIS — Z1331 Encounter for screening for depression: Secondary | ICD-10-CM | POA: Diagnosis not present

## 2020-07-22 DIAGNOSIS — Z0001 Encounter for general adult medical examination with abnormal findings: Secondary | ICD-10-CM | POA: Diagnosis not present

## 2020-07-22 DIAGNOSIS — E1129 Type 2 diabetes mellitus with other diabetic kidney complication: Secondary | ICD-10-CM | POA: Diagnosis not present

## 2020-08-09 DIAGNOSIS — Z1389 Encounter for screening for other disorder: Secondary | ICD-10-CM | POA: Diagnosis not present

## 2020-08-09 DIAGNOSIS — Z6839 Body mass index (BMI) 39.0-39.9, adult: Secondary | ICD-10-CM | POA: Diagnosis not present

## 2020-08-09 DIAGNOSIS — Z48298 Encounter for aftercare following other organ transplant: Secondary | ICD-10-CM | POA: Diagnosis not present

## 2020-08-09 DIAGNOSIS — Z0001 Encounter for general adult medical examination with abnormal findings: Secondary | ICD-10-CM | POA: Diagnosis not present

## 2020-08-09 DIAGNOSIS — Z944 Liver transplant status: Secondary | ICD-10-CM | POA: Diagnosis not present

## 2020-08-09 DIAGNOSIS — E039 Hypothyroidism, unspecified: Secondary | ICD-10-CM | POA: Diagnosis not present

## 2020-08-10 LAB — TSH: TSH: 4.3 u[IU]/mL (ref 0.450–4.500)

## 2020-08-10 LAB — T4, FREE: Free T4: 1.12 ng/dL (ref 0.82–1.77)

## 2020-08-16 NOTE — Patient Instructions (Addendum)
[name]  08/16/2020     @PREFPERIOPPHARMACY @   Your procedure is scheduled on  08/22/2020   Report to [facility] at  [place].   Call this number if you have problems the morning of surgery:  [phone]   Remember:  Follow the diet instructions given to you by the office.                   Take these medicines the morning of surgery with A SIP OF WATER     Levothyroxine, prograf.           Take 17 units of your Lantus the night before your procedure.  DO NOT take any medications for diabetes the morning of your procedure.              If your glucose is 70 or under the morning of your procedure, drink 1/2 cup of clear juice and recheck your glucose in 15 min. If your glucose is still not over 70, call [phone] for instructions.          If your glucose is over 300 the morning of your procedure, call [phone] for instructions.    Please brush your teeth.  Do not wear jewelry, make-up or nail polish.  Do not wear lotions, powders, or perfumes, or deodorant.  Do not shave 48 hours prior to surgery.  Men may shave face and neck.  Do not bring valuables to the hospital.  [facility] is not responsible for any belongings or valuables.  Contacts, dentures or bridgework may not be worn into surgery.  Leave your suitcase in the car.  After surgery it may be brought to your room.  For patients admitted to the hospital, discharge time will be determined by your treatment team.  Patients discharged the day of surgery will not be allowed to drive home and must have someone with them for 24 hours.   Special instructions:  DO NOT smoke tobacco or vape the morning of your procedure.  Please read over the following fact sheets that you were given. Anesthesia Post-op Instructions and Care and Recovery After Surgery       Upper Endoscopy, Adult, Care After This sheet gives you information about how to care for yourself after your procedure. Your health care  provider may also give you more specific instructions. If you have problems or questions, contact your health care provider. What can I expect after the procedure? After the procedure, it is common to have:  A sore throat.  Mild stomach pain or discomfort.  Bloating.  Nausea. Follow these instructions at home:  Follow instructions from your health care provider about what to eat or drink after your procedure.  Return to your normal activities as told by your health care provider. Ask your health care provider what activities are safe for you.  Take over-the-counter and prescription medicines only as told by your health care provider.  If you were given a sedative during the procedure, it can affect you for several hours. Do not drive or operate machinery until your health care provider says that it is safe.  Keep all follow-up visits as told by your health care provider. This is important.   Contact a health care provider if you have:  A sore throat that lasts longer than one day.  Trouble swallowing. Get help right away if:  You vomit blood or your vomit looks like coffee grounds.  You have: ? A fever. ? Bloody, black, or tarry stools. ? A severe sore throat or you cannot swallow. ? Difficulty breathing. ? Severe pain in your chest or abdomen. Summary  After the procedure, it is common to have a sore throat, mild stomach discomfort, bloating, and nausea.  If you were given a sedative during the procedure, it can affect you for several hours. Do not drive or operate machinery until your health care provider says that it is safe.  Follow instructions from your health care provider about what to eat or drink after your procedure.  Return to your normal activities as told by your health care provider. This information is not intended to replace advice given to you by your health care provider. Make sure you discuss any questions you have with your health care  provider. Document Revised: 06/23/2019 Document Reviewed: 11/25/2017 Elsevier Patient Education  2021 [place].  https://www.asge.org/home/for-patients/patient-information/understanding-eso-dilation-updated">  Esophageal Dilatation Esophageal dilatation, also called esophageal dilation, is a procedure to widen or open a blocked or narrowed part of the esophagus. The esophagus is the part of the body that moves food and liquid from the mouth to the stomach. You may need this procedure if:  You have a buildup of scar tissue in your esophagus that makes it difficult, painful, or impossible to swallow. This can be caused by gastroesophageal reflux disease (GERD).  You have cancer of the esophagus.  There is a problem with how food moves through your esophagus. In some cases, you may need this procedure repeated at a later time to dilate the esophagus gradually. Tell a health care provider about:  Any allergies you have.  All medicines you are taking, including vitamins, herbs, eye drops, creams, and over-the-counter medicines.  Any problems you or family members have had with anesthetic medicines.  Any blood disorders you have.  Any surgeries you have had.  Any medical conditions you have.  Any antibiotic medicines you are required to take before dental procedures.  Whether you are pregnant or may be pregnant. What are the risks? Generally, this is a safe procedure. However, problems may occur, including:  Bleeding due to a tear in the lining of the esophagus.  A hole, or perforation, in the esophagus. What happens before the procedure?  Ask your health care provider about: ? Changing or stopping your regular medicines. This is especially important if you are taking diabetes medicines or blood thinners. ? Taking medicines such as aspirin and ibuprofen. These medicines can thin your blood. Do not take these medicines unless your health care provider tells you to take  them. ? Taking over-the-counter medicines, vitamins, herbs, and supplements.  Follow instructions from your health care provider about eating or drinking restrictions.  Plan to have a responsible adult take you home from the hospital or clinic.  Plan to have a responsible adult care for you for the time you are told after you leave the hospital or clinic. This is important. What happens during the procedure?  You may be given a medicine to help you relax (sedative).  A numbing medicine may be sprayed into the back of your throat, or you may gargle the medicine.  Your health care provider may perform the dilatation using various surgical instruments, such as: ? Simple dilators. This instrument is carefully placed in the esophagus to stretch it. ? Guided wire bougies. This involves using an endoscope to insert a wire into the esophagus. A dilator is passed over this wire to enlarge the  esophagus. Then the wire is removed. ? Balloon dilators. An endoscope with a small balloon is inserted into the esophagus. The balloon is inflated to stretch the esophagus and open it up. The procedure may vary among health care providers and hospitals. What can I expect after the procedure?  Your blood pressure, heart rate, breathing rate, and blood oxygen level will be monitored until you leave the hospital or clinic.  Your throat may feel slightly sore and numb. This will get better over time.  You will not be allowed to eat or drink until your throat is no longer numb.  When you are able to drink, urinate, and sit on the edge of the bed without nausea or dizziness, you may be able to return home. Follow these instructions at home:  Take over-the-counter and prescription medicines only as told by your health care provider.  If you were given a sedative during the procedure, it can affect you for several hours. Do not drive or operate machinery until your health care provider says that it is  safe.  Plan to have a responsible adult care for you for the time you are told. This is important.  Follow instructions from your health care provider about any eating or drinking restrictions.  Do not use any products that contain nicotine or tobacco, such as cigarettes, e-cigarettes, and chewing tobacco. If you need help quitting, ask your health care provider.  Keep all follow-up visits. This is important. Contact a health care provider if:  You have a fever.  You have pain that is not relieved by medicine. Get help right away if:  You have chest pain.  You have trouble breathing.  You have trouble swallowing.  You vomit blood.  You have black, tarry, or bloody stools. These symptoms may represent a serious problem that is an emergency. Do not wait to see if the symptoms will go away. Get medical help right away. Call your local emergency services (911 in the [place] Do not drive yourself to the hospital. Summary  Esophageal dilatation, also called esophageal dilation, is a procedure to widen or open a blocked or narrowed part of the esophagus.  Plan to have a responsible adult take you home from the hospital or clinic.  For this procedure, a numbing medicine may be sprayed into the back of your throat, or you may gargle the medicine.  Do not drive or operate machinery until your health care provider says that it is safe. This information is not intended to replace advice given to you by your health care provider. Make sure you discuss any questions you have with your health care provider. Document Revised: 11/11/2019 Document Reviewed: 11/11/2019 Elsevier Patient Education  2021 [place] After This sheet gives you information about how to care for yourself after your procedure. Your health care provider may also give you more specific instructions. If you have problems or questions, contact your health care provider. What can I  expect after the procedure? After the procedure, it is common to have:  Tiredness.  Forgetfulness about what happened after the procedure.  Impaired judgment for important decisions.  Nausea or vomiting.  Some difficulty with balance. Follow these instructions at home: For the time period you were told by your health care provider:  Rest as needed.  Do not participate in activities where you could fall or become injured.  Do not drive or use machinery.  Do not drink alcohol.  Do not  take sleeping pills or medicines that cause drowsiness.  Do not make important decisions or sign legal documents.  Do not take care of children on your own.      Eating and drinking  Follow the diet that is recommended by your health care provider.  Drink enough fluid to keep your urine pale yellow.  If you vomit: ? Drink water, juice, or soup when you can drink without vomiting. ? Make sure you have little or no nausea before eating solid foods. General instructions  Have a responsible adult stay with you for the time you are told. It is important to have someone help care for you until you are awake and alert.  Take over-the-counter and prescription medicines only as told by your health care provider.  If you have sleep apnea, surgery and certain medicines can increase your risk for breathing problems. Follow instructions from your health care provider about wearing your sleep device: ? Anytime you are sleeping, including during daytime naps. ? While taking prescription pain medicines, sleeping medicines, or medicines that make you drowsy.  Avoid smoking.  Keep all follow-up visits as told by your health care provider. This is important. Contact a health care provider if:  You keep feeling nauseous or you keep vomiting.  You feel light-headed.  You are still sleepy or having trouble with balance after 24 hours.  You develop a rash.  You have a fever.  You have redness or  swelling around the IV site. Get help right away if:  You have trouble breathing.  You have new-onset confusion at home. Summary  For several hours after your procedure, you may feel tired. You may also be forgetful and have poor judgment.  Have a responsible adult stay with you for the time you are told. It is important to have someone help care for you until you are awake and alert.  Rest as told. Do not drive or operate machinery. Do not drink alcohol or take sleeping pills.  Get help right away if you have trouble breathing, or if you suddenly become confused. This information is not intended to replace advice given to you by your health care provider. Make sure you discuss any questions you have with your health care provider. Document Revised: 03/10/2020 Document Reviewed: 05/28/2019 Elsevier Patient Education  2021 [employer].

## 2020-08-18 ENCOUNTER — Other Ambulatory Visit (HOSPITAL_COMMUNITY)
Admission: RE | Admit: 2020-08-18 | Discharge: 2020-08-18 | Disposition: A | Discharge status: Home / Self Care | Payer: PPO | Source: Ambulatory Visit | Attending: Internal Medicine | Admitting: Internal Medicine

## 2020-08-18 ENCOUNTER — Encounter (HOSPITAL_COMMUNITY): Payer: Self-pay

## 2020-08-18 ENCOUNTER — Other Ambulatory Visit: Payer: Self-pay

## 2020-08-18 ENCOUNTER — Encounter (HOSPITAL_COMMUNITY)
Admission: RE | Admit: 2020-08-18 | Discharge: 2020-08-18 | Disposition: A | Discharge status: Home / Self Care | Payer: PPO | Source: Ambulatory Visit | Attending: Internal Medicine | Admitting: Internal Medicine

## 2020-08-18 DIAGNOSIS — Z01812 Encounter for preprocedural laboratory examination: Secondary | ICD-10-CM | POA: Insufficient documentation

## 2020-08-18 DIAGNOSIS — Z20822 Contact with and (suspected) exposure to covid-19: Secondary | ICD-10-CM | POA: Insufficient documentation

## 2020-08-18 HISTORY — DX: Cerebral infarction, unspecified: I63.9

## 2020-08-18 LAB — COMPREHENSIVE METABOLIC PANEL
ALT: 17 U/L (ref 0–44)
AST: 16 U/L (ref 15–41)
Albumin: 3.4 g/dL — ABNORMAL LOW (ref 3.5–5.0)
Alkaline Phosphatase: 68 U/L (ref 38–126)
Anion gap: 7 (ref 5–15)
BUN: 27 mg/dL — ABNORMAL HIGH (ref 8–23)
CO2: 28 mmol/L (ref 22–32)
Calcium: 8.9 mg/dL (ref 8.9–10.3)
Chloride: 103 mmol/L (ref 98–111)
Creatinine, Ser: 1.33 mg/dL — ABNORMAL HIGH (ref 0.61–1.24)
GFR, Estimated: 54 mL/min — ABNORMAL LOW (ref >60)
Glucose, Bld: 83 mg/dL (ref 70–99)
Potassium: 3.7 mmol/L (ref 3.5–5.1)
Sodium: 138 mmol/L (ref 135–145)
Total Bilirubin: 0.9 mg/dL (ref 0.3–1.2)
Total Protein: 7.1 g/dL (ref 6.5–8.1)

## 2020-08-18 LAB — CBC WITH DIFFERENTIAL/PLATELET
Abs Immature Granulocytes: 0.03 10*3/uL (ref 0.00–0.07)
Basophils Absolute: 0 10*3/uL (ref 0.0–0.1)
Basophils Relative: 1 %
Eosinophils Absolute: 0.3 10*3/uL (ref 0.0–0.5)
Eosinophils Relative: 4 %
HCT: 41.3 % (ref 39.0–52.0)
Hemoglobin: 13.3 g/dL (ref 13.0–17.0)
Immature Granulocytes: 0 %
Lymphocytes Relative: 27 %
Lymphs Abs: 2.1 10*3/uL (ref 0.7–4.0)
MCH: 32.8 pg (ref 26.0–34.0)
MCHC: 32.2 g/dL (ref 30.0–36.0)
MCV: 101.7 fL — ABNORMAL HIGH (ref 80.0–100.0)
Monocytes Absolute: 0.6 10*3/uL (ref 0.1–1.0)
Monocytes Relative: 8 %
Neutro Abs: 4.7 10*3/uL (ref 1.7–7.7)
Neutrophils Relative %: 60 %
Platelets: 195 10*3/uL (ref 150–400)
RBC: 4.06 MIL/uL — ABNORMAL LOW (ref 4.22–5.81)
RDW: 13.3 % (ref 11.5–15.5)
WBC: 7.8 10*3/uL (ref 4.0–10.5)
nRBC: 0 % (ref 0.0–0.2)

## 2020-08-18 LAB — PROTIME-INR
INR: 1 (ref 0.8–1.2)
Prothrombin Time: 13.2 seconds (ref 11.4–15.2)

## 2020-08-18 NOTE — Progress Notes (Signed)
   08/18/20 1355  OBSTRUCTIVE SLEEP APNEA  Have you ever been diagnosed with sleep apnea through a sleep study? No  Do you snore loudly (loud enough to be heard through closed doors)?  0  Do you often feel tired, fatigued, or sleepy during the daytime (such as falling asleep during driving or talking to someone)? 0  Has anyone observed you stop breathing during your sleep? 0  Do you have, or are you being treated for high blood pressure? 1  BMI more than 35 kg/m2? 1  Age > 50 (1-yes) 1  Neck circumference greater than:Male 16 inches or larger, Male 17inches or larger? 0  Male Gender (Yes=1) 1  Obstructive Sleep Apnea Score 4  Score 5 or greater  Results sent to PCP

## 2020-08-19 LAB — SARS CORONAVIRUS 2 (TAT 6-24 HRS): SARS Coronavirus 2: NEGATIVE

## 2020-08-22 ENCOUNTER — Ambulatory Visit (HOSPITAL_COMMUNITY): Payer: PPO | Admitting: Anesthesiology

## 2020-08-22 ENCOUNTER — Ambulatory Visit (HOSPITAL_COMMUNITY)
Admission: RE | Admit: 2020-08-22 | Discharge: 2020-08-22 | Disposition: A | Discharge status: Home / Self Care | Payer: PPO | Attending: Internal Medicine | Admitting: Internal Medicine

## 2020-08-22 ENCOUNTER — Encounter (HOSPITAL_COMMUNITY)
Admission: RE | Disposition: A | Discharge status: Home / Self Care | Payer: Self-pay | Source: Home / Self Care | Attending: Internal Medicine

## 2020-08-22 DIAGNOSIS — K222 Esophageal obstruction: Secondary | ICD-10-CM | POA: Diagnosis not present

## 2020-08-22 DIAGNOSIS — K2289 Other specified disease of esophagus: Secondary | ICD-10-CM | POA: Insufficient documentation

## 2020-08-22 DIAGNOSIS — R1310 Dysphagia, unspecified: Secondary | ICD-10-CM | POA: Diagnosis not present

## 2020-08-22 DIAGNOSIS — Z79890 Hormone replacement therapy (postmenopausal): Secondary | ICD-10-CM | POA: Insufficient documentation

## 2020-08-22 DIAGNOSIS — Z79899 Other long term (current) drug therapy: Secondary | ICD-10-CM | POA: Diagnosis not present

## 2020-08-22 DIAGNOSIS — Z7982 Long term (current) use of aspirin: Secondary | ICD-10-CM | POA: Insufficient documentation

## 2020-08-22 DIAGNOSIS — Z794 Long term (current) use of insulin: Secondary | ICD-10-CM | POA: Insufficient documentation

## 2020-08-22 DIAGNOSIS — Z91041 Radiographic dye allergy status: Secondary | ICD-10-CM | POA: Diagnosis not present

## 2020-08-22 DIAGNOSIS — E039 Hypothyroidism, unspecified: Secondary | ICD-10-CM | POA: Diagnosis not present

## 2020-08-22 DIAGNOSIS — Z944 Liver transplant status: Secondary | ICD-10-CM | POA: Insufficient documentation

## 2020-08-22 HISTORY — PX: ESOPHAGOGASTRODUODENOSCOPY (EGD) WITH PROPOFOL: SHX5813

## 2020-08-22 HISTORY — PX: MALONEY DILATION: SHX5535

## 2020-08-22 LAB — GLUCOSE, CAPILLARY
Glucose-Capillary: 119 mg/dL — ABNORMAL HIGH (ref 70–99)
Glucose-Capillary: 104 mg/dL — ABNORMAL HIGH (ref 70–99)

## 2020-08-22 SURGERY — ESOPHAGOGASTRODUODENOSCOPY (EGD) WITH PROPOFOL
Anesthesia: General

## 2020-08-22 MED ORDER — LIDOCAINE VISCOUS HCL 2 % MT SOLN
OROMUCOSAL | Status: AC
  Filled 2020-08-22: qty 15

## 2020-08-22 MED ORDER — PROPOFOL 500 MG/50ML IV EMUL
INTRAVENOUS | Status: DC | PRN
  Administered 2020-08-22: 150 ug/kg/min via INTRAVENOUS

## 2020-08-22 MED ORDER — PROPOFOL 10 MG/ML IV BOLUS
INTRAVENOUS | Status: AC
  Filled 2020-08-22: qty 80

## 2020-08-22 MED ORDER — STERILE WATER FOR IRRIGATION IR SOLN
Status: DC | PRN
  Administered 2020-08-22: 1.5 mL

## 2020-08-22 MED ORDER — GLYCOPYRROLATE 0.2 MG/ML IJ SOLN
INTRAMUSCULAR | Status: AC
  Filled 2020-08-22: qty 1

## 2020-08-22 MED ORDER — CHLORHEXIDINE GLUCONATE CLOTH 2 % EX PADS: 6.0000 | MEDICATED_PAD | Freq: Once | CUTANEOUS | Status: DC

## 2020-08-22 MED ORDER — GLYCOPYRROLATE 0.2 MG/ML IJ SOLN
0.2000 mg | Freq: Once | INTRAMUSCULAR | Status: AC
  Administered 2020-08-22: 0.2 mg via INTRAVENOUS

## 2020-08-22 MED ORDER — LACTATED RINGERS IV SOLN: INTRAVENOUS | Status: DC

## 2020-08-22 MED ORDER — LIDOCAINE VISCOUS HCL 2 % MT SOLN
15.0000 mL | Freq: Once | OROMUCOSAL | Status: AC
  Administered 2020-08-22: 15 mL via OROMUCOSAL

## 2020-08-22 NOTE — Anesthesia Preprocedure Evaluation (Signed)
Anesthesia Evaluation  Patient identified by MRN, date of birth, ID band Patient awake    Reviewed: Allergy & Precautions, NPO status , Patient's Chart, lab work & pertinent test results  History of Anesthesia Complications Negative for: history of anesthetic complications  Airway Mallampati: III  TM Distance: >3 FB Neck ROM: Full    Dental  (+) Missing, Dental Advisory Given   Pulmonary shortness of breath and with exertion,    Pulmonary exam normal breath sounds clear to auscultation       Cardiovascular METS: 3 - Mets hypertension, Pt. on medications  Rhythm:Regular Rate:Normal  10-Feb-2020 05:18:50 [place] [facility] ROUTINE RECORD Sinus rhythm Low voltage, precordial leads Abnormal R-wave progression, early transition Borderline abnrm T, anterolateral leads Confirmed by Veryl Speak [phone]) on 02/10/2020 5:33:38 AM   Neuro/Psych CVA, No Residual Symptoms negative psych ROS   GI/Hepatic GERD  Medicated,(+) Cirrhosis   Esophageal Varices    , Hepatitis -Liver transplant  Food impaction - 02/2020   Endo/Other  diabetes, Well Controlled, Type 1, Insulin DependentHypothyroidism   Renal/GU Renal InsufficiencyRenal disease     Musculoskeletal  (+) Arthritis ,   Abdominal   Peds  Hematology  (+) anemia ,   Anesthesia Other Findings   Reproductive/Obstetrics negative OB ROS                            Anesthesia Physical  Anesthesia Plan  ASA: III  Anesthesia Plan: General   Post-op Pain Management:    Induction: Intravenous  PONV Risk Score and Plan: 2 and Ondansetron  Airway Management Planned: Nasal Cannula and Natural Airway  Additional Equipment:   Intra-op Plan:   Post-operative Plan:   Informed Consent: I have reviewed the patients History and Physical, chart, labs and discussed the procedure including the risks, benefits and alternatives for the  proposed anesthesia with the patient or authorized representative who has indicated his/her understanding and acceptance.     Dental advisory given  Plan Discussed with: Surgeon and CRNA  Anesthesia Plan Comments:         Anesthesia Quick Evaluation

## 2020-08-22 NOTE — Op Note (Signed)
[facility] Patient Name: [name] Procedure Date: 08/22/2020 7:21 AM MRN: [id number] Date of Birth: Sep 28, 1940 Attending MD: Norvel Richards , MD CSN: [id number] Age: 80 Admit Type: Outpatient Procedure:                Upper GI endoscopy Indications:              Dysphagia Providers:                Norvel Richards, MD, Caprice Kluver, Nelma Rothman,                            Technician Referring MD:              Medicines:                Propofol per Anesthesia Complications:            No immediate complications. Estimated Blood Loss:     Estimated blood loss was minimal. Estimated blood                            loss was minimal. Procedure:                Pre-Anesthesia Assessment:                           - Prior to the procedure, a History and Physical                            was performed, and patient medications and                            allergies were reviewed. The patient's tolerance of                            previous anesthesia was also reviewed. The risks                            and benefits of the procedure and the sedation                            options and risks were discussed with the patient.                            All questions were answered, and informed consent                            was obtained. Prior Anticoagulants: The patient has                            taken no previous anticoagulant or antiplatelet                            agents. ASA Grade Assessment: III - A patient with                            severe  systemic disease. After reviewing the risks                            and benefits, the patient was deemed in                            satisfactory condition to undergo the procedure.                           After obtaining informed consent, the endoscope was                            passed under direct vision. Throughout the                            procedure, the patient's blood pressure, pulse, and                             oxygen saturations were monitored continuously. The                            GIF-H190 ([id number]) scope was introduced through the                            mouth, and advanced to the second part of duodenum.                            The upper GI endoscopy was accomplished without                            difficulty. The patient tolerated the procedure                            well. Scope In: 7:40:13 AM Scope Out: 7:48:01 AM Total Procedure Duration: 0 hours 7 minutes 48 seconds  Findings:      Examination of the tubular esophagus revealed some longitudinal ribbon       like, desiccated areas - circumferentially just above the GE junction.       GE junction was somewhat narrowed. Felt the gastroscope "catch" on       apparent submucosal web which was felt much better than seen. No tumor.       No reflux esophagitis seen.      The entire examined stomach was normal.      The duodenal bulb and second portion of the duodenum were normal. Scope       was withdrawn. A 56 French Maloney dilator was passed to full insertion       with slight resistance. A look back revealed no change with this       maneuver and again the web was felt with the tip of the scope traversing       the GE junction. I removed the scope and passed a 8 French Maloney       dilator to full insertion with mild resistance. A look back revealed       some superficial tearing at the GE junction and the scope  passed freely       across the GE junction without impediment. Procedure was then concluded. Impression:               -Esophagus dissecans. Submucosal web - status post                            dilation and disruption as described above                           -Normal stomach.                           - Normal duodenal bulb and second portion of the                            duodenum.                           - No specimens collected. Moderate Sedation:      Moderate  (conscious) sedation was personally administered by an       anesthesia professional. The following parameters were monitored: oxygen       saturation, heart rate, blood pressure, respiratory rate, EKG, adequacy       of pulmonary ventilation, and response to care. Recommendation:           - Patient has a contact number available for                            emergencies. The signs and symptoms of potential                            delayed complications were discussed with the                            patient. Return to normal activities tomorrow.                            Written discharge instructions were provided to the                            patient.                           - Advance diet as tolerated. Patient needs to get                            back on at least once daily PPI therapy. Will begin                            Protonix 40 mg once daily. Swallowing precautions                            reviewed. Office visit with [place] in 6 months. Procedure Code(s):        --- Professional ---  94707, Esophagogastroduodenoscopy, flexible,                            transoral; diagnostic, including collection of                            specimen(s) by brushing or washing, when performed                            (separate procedure) Diagnosis Code(s):        --- Professional ---                           R13.10, Dysphagia, unspecified CPT copyright 2019 American Medical Association. All rights reserved. The codes documented in this report are preliminary and upon coder review may  be revised to meet current compliance requirements. [name]. Kyle Vanbergen, MD Norvel Richards, MD 08/22/2020 8:06:21 AM This report has been signed electronically. Number of Addenda: 0

## 2020-08-22 NOTE — Transfer of Care (Signed)
Immediate Anesthesia Transfer of Care Note  Patient: [name]  Procedure(s) Performed: ESOPHAGOGASTRODUODENOSCOPY (EGD) WITH PROPOFOL (N/A ) MALONEY DILATION (N/A )  Patient Location: PACU  Anesthesia Type:General  Level of Consciousness: awake, alert , oriented and patient cooperative  Airway & Oxygen Therapy: Patient Spontanous Breathing and Patient connected to nasal cannula oxygen  Post-op Assessment: Report given to RN, Post -op Vital signs reviewed and stable and Patient moving all extremities X 4  Post vital signs: Reviewed and stable  Last Vitals:  Vitals Value Taken Time  BP 124/76 08/22/20 0800  Temp 36.9 C 08/22/20 0754  Pulse 77 08/22/20 0802  Resp 18 08/22/20 0802  SpO2 98 % 08/22/20 0802  Vitals shown include unvalidated device data.  Last Pain:  Vitals:   08/22/20 0754  TempSrc:   PainSc: 0-No pain      Patients Stated Pain Goal: 5 (48/01/65 5374)  Complications: No complications documented.

## 2020-08-22 NOTE — Anesthesia Postprocedure Evaluation (Signed)
Anesthesia Post Note  Patient: EDIBERTO SENS  Procedure(s) Performed: ESOPHAGOGASTRODUODENOSCOPY (EGD) WITH PROPOFOL (N/A ) MALONEY DILATION (N/A )  Patient location during evaluation: PACU Anesthesia Type: General Level of consciousness: awake, oriented, awake and alert and patient cooperative Pain management: pain level controlled Vital Signs Assessment: post-procedure vital signs reviewed and stable Respiratory status: respiratory function stable, spontaneous breathing, nonlabored ventilation and patient connected to face mask oxygen Cardiovascular status: stable Postop Assessment: no apparent nausea or vomiting Anesthetic complications: no   No complications documented.   Last Vitals:  Vitals:   08/22/20 0754 08/22/20 0800  BP: 133/78 124/76  Pulse: 78 81  Resp: 18 20  Temp: 36.9 C   SpO2: 100% 100%    Last Pain:  Vitals:   08/22/20 0754  TempSrc:   PainSc: 0-No pain                 Antha Niday

## 2020-08-22 NOTE — H&P (Signed)
@LOGO @   Primary Care Physician:  Sharilyn Sites, MD Primary Gastroenterologist:  Dr. Gala Romney Pre-Procedure History & Physical: HPI:  [name] is a 80 y.o. male here for follow-up EGD and possible esophageal dilation per plan.  Treat impaction last year.  Removed and endoscopy.  Distal esophageal ulcer was benign on biopsy.  Stop PPI because of diarrhea. Had any issues recently.  Hypoxemic after the procedure last year.  Discussed with anesthesia today. Past Medical History:  Diagnosis Date  . Anemia   . Biliary calculi, common bile duct   . CRI (chronic renal insufficiency)   . DM (diabetes mellitus) ([place])    Type II  . Elevated liver enzymes   . Esophageal varices ([place]) 03/19/11   mrcp  . History of ERCP 03/21/2011   Extrahepatic biliary obstruction secondary to occluded biliary stent and colon no cholelithiasis, status post sphincterotomy, stent removal and replacement and stone extraction.  Marland Kitchen HTN (hypertension)   . Hypothyroidism   . NASH (nonalcoholic steatohepatitis) 2006   liver transplant  . Pancreatitis chronic 03/19/11   mrcp  . S/P colonoscopy with polypectomy 2004   Dr Gala Romney  . S/P endoscopy 03/2004   Hx esophgaeal varices, pyloric channel ulcer prior to transplant  . Squamous cell cancer of external ear    pinna  . Stroke ([place])    "mini-stroke, weakness of left leg".    Past Surgical History:  Procedure Laterality Date  . BILE DUCT STENT PLACEMENT  03/21/11   Dr. Gala Romney  . BIOPSY  02/12/2020   Procedure: BIOPSY;  Surgeon: Montez Morita, Quillian Quince, MD;  Location: [facility];  Service: Gastroenterology;;  . CATARACT EXTRACTION Bilateral   . CHOLECYSTECTOMY  2006  . COLONOSCOPY  08/12/2002   Dr. Gala Romney- somewhat diffusely friable rectum and colon, polyp at 30 cm.  . COLONOSCOPY N/A 09/08/2012   Procedure: COLONOSCOPY;  Surgeon: Daneil Dolin, MD;  colonic diverticulosis. Repeat in 10 years.   Marland Kitchen ERCP  03/21/2011   Dr. Gala Romney- ERCP with removal of pediatric feeding  tube, endoscopic sphinecterotomy with balloon dilation of ampulla followed by stone extraction and stent placement  . ESOPHAGOGASTRODUODENOSCOPY  03/20/2004   Dr. Nash Mantis 2 esophageal varices o/w normal esophageal mucosa, mild changes of snake skin in the gastric mucosa consistent with portal gastropathy, multiple antral erosions  and  a single pyloric channel ulcer benign appearance o/w normal gastric mucosa, normal D1 and S2  . ESOPHAGOGASTRODUODENOSCOPY (EGD) WITH PROPOFOL N/A 02/12/2020   Procedure: ESOPHAGOGASTRODUODENOSCOPY (EGD) WITH PROPOFOL;  Surgeon: Montez Morita, Quillian Quince, MD; Food impaction in the middle third of the esophagus s/p removal, three superficial esophageal ulcerations with no stigmata of recent bleeding, one cratered nonnecrotic esophageal ulcer with no stigmata of recent bleed in the mid esophagus s/p biopsied. Path consistet with ulcer.   Marland Kitchen KNEE ARTHROSCOPY WITH EXCISION PLICA Left 1/61/0960   Procedure: KNEE ARTHROSCOPY WITH EXCISION PLICA;  Surgeon: Carole Civil, MD;  Location: [facility];  Service: Orthopedics;  Laterality: Left;  . KNEE ARTHROSCOPY WITH MEDIAL MENISECTOMY Left 11/21/2012   Procedure: KNEE ARTHROSCOPY WITH MEDIAL AND LATERAL MENISECTOMY;  Surgeon: Carole Civil, MD;  Location: [facility];  Service: Orthopedics;  Laterality: Left;  . LIVER TRANSPLANT  2006   UVA-NASH cirrhosis  . MOUTH SURGERY  may 2012  . MRCP  03/19/11   biliary ductal dilatation- stones. esophageal varices  . SPHINCTEROTOMY  03/21/2011   Procedure: SPHINCTEROTOMY;  Surgeon: Daneil Dolin, MD;  Location: [facility];  Service: Gastroenterology;;  Prior to Admission medications   Medication Sig Start Date End Date Taking? Authorizing Provider  aspirin 325 MG tablet Take 1 tablet (325 mg total) by mouth daily. 04/16/17  Yes Oswald Hillock, MD  docusate sodium (COLACE) 100 MG capsule Take 100 mg by mouth daily.   Yes [provider]  insulin aspart (NOVOLOG FLEXPEN) 100  UNIT/ML FlexPen Inject 12-15 Units into the skin 3 (three) times daily with meals. Give per sliding scale three times a day 07/19/20  Yes Reardon, Juanetta Beets, NP  insulin glargine (LANTUS SOLOSTAR) 100 UNIT/ML Solostar Pen Inject 34 Units into the skin daily at 10 pm. 07/19/20  Yes Brita Romp, NP  levothyroxine (SYNTHROID) 137 MCG tablet Take 1 tablet (137 mcg total) by mouth daily before breakfast. 03/16/20  Yes Brita Romp, NP  MAGNESIUM PO Take 133 mg by mouth 2 (two) times daily.   Yes [provider]  Multiple Vitamins-Minerals (MULTIVITAMIN WITH MINERALS) tablet Take 1 tablet by mouth daily.     Yes [provider]  olmesartan (BENICAR) 40 MG tablet Take 40 mg by mouth daily.  10/23/19  Yes [provider]  tacrolimus (PROGRAF) 0.5 MG capsule Take 0.5 mg by mouth 2 (two) times daily.    Yes [provider]  Tetrahydrozoline-PEG (EYE MOISTURIZING RELIEF OP) Place 1 drop into both eyes daily as needed (Dry eye).   Yes [provider]  ONETOUCH ULTRA test strip TESTING FOUR TIMES DAILY. 04/25/20   Cassandria Anger, MD    Allergies as of 04/28/2020 - Review Complete 04/06/2020  Allergen Reaction Noted  . Contrast media [iodinated diagnostic agents] Rash 03/27/2011    Family History  Problem Relation Age of Onset  . Cirrhosis Father        nash  . Lymphoma Mother   . Diabetes Daughter     Social History   Socioeconomic History  . Marital status: Married    Spouse name: Not on file  . Number of children: 1  . Years of education: 14  . Highest education level: Not on file  Occupational History  . Occupation: RETIRED    Employer: RETIRED  Tobacco Use  . Smoking status: Never Smoker  . Smokeless tobacco: Never Used  Vaping Use  . Vaping Use: Never used  Substance and Sexual Activity  . Alcohol use: No  . Drug use: No  . Sexual activity: Yes  Other Topics Concern  . Not on file  Social History Narrative  . Not on  file   Social Determinants of Health   Financial Resource Strain: Not on file  Food Insecurity: Not on file  Transportation Needs: Not on file  Physical Activity: Not on file  Stress: Not on file  Social Connections: Not on file  Intimate Partner Violence: Not on file    Review of Systems: See HPI, otherwise negative ROS  Physical Exam: BP (!) 170/98   Pulse 81   Resp 18   SpO2 97%  General:   Alert,   pleasant and cooperative in NAD Neck:  Supple; no masses or thyromegaly. No significant cervical adenopathy. Lungs:  Clear throughout to auscultation.   No wheezes, crackles, or rhonchi. No acute distress. Heart:  Regular rate and rhythm; no murmurs, clicks, rubs,  or gallops. Abdomen: Non-distended, normal bowel sounds.  Soft and nontender without appreciable mass or hepatosplenomegaly.  Pulses:  Normal pulses noted. Extremities:  Without clubbing or edema.  Impression/Plan: 80 year old gentleman status post orthotopic liver transplant  esophageal dysphagia.  Food impaction last year.  Here for elective EGD with esophageal dilation as feasible/appropriate per plan.  I have discussed respiratory issues with anesthesia. I have offered the patient an EGD with esophageal dilation as feasible/appropriate per plan.  The risks, benefits, limitations, alternatives and imponderables have been reviewed with the patient. Potential for esophageal dilation, biopsy, etc. have also been reviewed.  Questions have been answered. All parties agreeable.     Notice: This dictation was prepared with Dragon dictation along with smaller phrase technology. Any transcriptional errors that result from this process are unintentional and may not be corrected upon review.

## 2020-08-22 NOTE — Discharge Instructions (Signed)
EGD Discharge instructions Please read the instructions outlined below and refer to this sheet in the next few weeks. These discharge instructions provide you with general information on caring for yourself after you leave the hospital. Your doctor may also give you specific instructions. While your treatment has been planned according to the most current medical practices available, unavoidable complications occasionally occur. If you have any problems or questions after discharge, please call your doctor. ACTIVITY  You may resume your regular activity but move at a slower pace for the next 24 hours.   Take frequent rest periods for the next 24 hours.   Walking will help expel (get rid of) the air and reduce the bloated feeling in your abdomen.   No driving for 24 hours (because of the anesthesia (medicine) used during the test).   You may shower.   Do not sign any important legal documents or operate any machinery for 24 hours (because of the anesthesia used during the test).  NUTRITION  Drink plenty of fluids.   You may resume your normal diet.   Begin with a light meal and progress to your normal diet.   Avoid alcoholic beverages for 24 hours or as instructed by your caregiver.  MEDICATIONS  You may resume your normal medications unless your caregiver tells you otherwise.  WHAT YOU CAN EXPECT TODAY  You may experience abdominal discomfort such as a feeling of fullness or "gas" pains.  FOLLOW-UP  Your doctor will discuss the results of your test with you.  SEEK IMMEDIATE MEDICAL ATTENTION IF ANY OF THE FOLLOWING OCCUR:  Excessive nausea (feeling sick to your stomach) and/or vomiting.   Severe abdominal pain and distention (swelling).   Trouble swallowing.   Temperature over 101 F (37.8 C).   Rectal bleeding or vomiting of blood.   Pantoprazole tablets What is this medicine? PANTOPRAZOLE (pan TOE pra zole) prevents the production of acid in the stomach. It is used  to treat gastroesophageal reflux disease (GERD), inflammation of the esophagus, and Zollinger-Ellison syndrome. This medicine may be used for other purposes; ask your health care provider or pharmacist if you have questions. COMMON BRAND NAME(S): Protonix What should I tell my health care provider before I take this medicine? They need to know if you have any of these conditions:  liver disease  low levels of magnesium in the blood  lupus  an unusual or allergic reaction to omeprazole, lansoprazole, pantoprazole, rabeprazole, other medicines, foods, dyes, or preservatives  pregnant or trying to get pregnant  breast-feeding How should I use this medicine? Take this medicine by mouth. Swallow the tablets whole with a drink of water. Follow the directions on the prescription label. Do not crush, break, or chew. Take your medicine at regular intervals. Do not take your medicine more often than directed. Talk to your pediatrician regarding the use of this medicine in children. While this drug may be prescribed for children as young as 5 years for selected conditions, precautions do apply. Overdosage: If you think you have taken too much of this medicine contact a poison control center or emergency room at once. NOTE: This medicine is only for you. Do not share this medicine with others. What if I miss a dose? If you miss a dose, take it as soon as you can. If it is almost time for your next dose, take only that dose. Do not take double or extra doses. What may interact with this medicine? Do not take this medicine with any  of the following medications:  atazanavir  nelfinavir This medicine may also interact with the following medications:  ampicillin  delavirdine  erlotinib  iron salts  medicines for fungal infections like ketoconazole, itraconazole and voriconazole  methotrexate  mycophenolate mofetil  warfarin This list may not describe all possible interactions. Give your  health care provider a list of all the medicines, herbs, non-prescription drugs, or dietary supplements you use. Also tell them if you smoke, drink alcohol, or use illegal drugs. Some items may interact with your medicine. What should I watch for while using this medicine? It can take several days before your stomach pain gets better. Check with your doctor or health care provider if your condition does not start to get better, or if it gets worse. This medicine may cause serious skin reactions. They can happen weeks to months after starting the medicine. Contact your health care provider right away if you notice fevers or flu-like symptoms with a rash. The rash may be red or purple and then turn into blisters or peeling of the skin. Or, you might notice a red rash with swelling of the face, lips or lymph nodes in your neck or under your arms. You may need blood work done while you are taking this medicine. This medicine may cause a decrease in vitamin B12. You should make sure that you get enough vitamin B12 while you are taking this medicine. Discuss the foods you eat and the vitamins you take with your health care provider. What side effects may I notice from receiving this medicine? Side effects that you should report to your doctor or health care professional as soon as possible:   allergic reactions like skin rash, itching or hives, swelling of the face, lips, or tongue   bone, muscle or joint pain   breathing problems   chest pain or chest tightness   dark yellow or brown urine   dizziness   fast, irregular heartbeat   feeling faint or lightheaded   fever or sore throat   muscle spasm   palpitations   rash on cheeks or arms that gets worse in the sun   redness, blistering, peeling or loosening of the skin, including inside the mouth   seizures  stomach polyps   tremors   unusual bleeding or bruising   unusually weak or tired   yellowing of the  eyes or skin Side effects that usually do not require medical attention (report to your doctor or health care professional if they continue or are bothersome):   constipation   diarrhea   dry mouth   headache   nausea This list may not describe all possible side effects. Call your doctor for medical advice about side effects. You may report side effects to FDA at 1-800-FDA-1088. Where should I keep my medicine? Keep out of the reach of children. Store at room temperature between 15 and 30 degrees C (59 and 86 degrees F). Protect from light and moisture. Throw away any unused medicine after the expiration date. NOTE: This sheet is a summary. It may not cover all possible information. If you have questions about this medicine, talk to your doctor, pharmacist, or health care provider.  2021 Elsevier/Gold Standard (2018-10-03 13:18:32)    Your esophagus wound was narrowed where it connects to your stomach. Your esophagus was dilated today  You should be on a once a day medication for reflux  Begin Protonix 40 mg once daily  Office visit with Aliene Altes in 6  months  At patient request, called Jerelyn Scott at [phone] -left message with findings and recommendations

## 2020-08-24 ENCOUNTER — Telehealth: Payer: Self-pay | Admitting: Internal Medicine

## 2020-08-24 NOTE — Telephone Encounter (Signed)
Pt said the pantoprazole has given him diarrhea and he needs something else instead. Please advise. [phone]

## 2020-08-24 NOTE — Telephone Encounter (Signed)
Spoke with pt. Pt d/c Omeprazole due to diarrhea. Pt started Pantoprazole 40 mg Monday 08/22/20 after his EGD and states he has had diarrhea with it as well. Pt has gone to the bathroom twice since he's been up this morning. Pt would like to take another medication.

## 2020-08-25 ENCOUNTER — Other Ambulatory Visit: Payer: Self-pay

## 2020-08-25 ENCOUNTER — Encounter (HOSPITAL_COMMUNITY): Payer: Self-pay | Admitting: Internal Medicine

## 2020-08-25 MED ORDER — FAMOTIDINE 20 MG PO TABS: 20.0000 mg | ORAL_TABLET | Freq: Two times a day (BID) | ORAL | 3 refills | Status: DC

## 2020-08-25 NOTE — Telephone Encounter (Signed)
Communication noted.  Lets try famotidine 20 mg tablet.  1 tablet twice daily (dispense 60 with 3 refills).

## 2020-08-25 NOTE — Telephone Encounter (Signed)
Noted. Spoke with pts spouse. Famotidine 20 mg bid was sent to pts pharmacy.

## 2020-08-29 ENCOUNTER — Telehealth: Payer: Self-pay | Admitting: Internal Medicine

## 2020-08-29 DIAGNOSIS — H524 Presbyopia: Secondary | ICD-10-CM | POA: Diagnosis not present

## 2020-08-29 DIAGNOSIS — H5213 Myopia, bilateral: Secondary | ICD-10-CM | POA: Diagnosis not present

## 2020-08-29 DIAGNOSIS — H5212 Myopia, left eye: Secondary | ICD-10-CM | POA: Diagnosis not present

## 2020-08-29 DIAGNOSIS — H52222 Regular astigmatism, left eye: Secondary | ICD-10-CM | POA: Diagnosis not present

## 2020-08-29 DIAGNOSIS — E119 Type 2 diabetes mellitus without complications: Secondary | ICD-10-CM | POA: Diagnosis not present

## 2020-08-29 LAB — HM DIABETES EYE EXAM

## 2020-08-29 NOTE — Telephone Encounter (Signed)
Spoke with pt. Pt started the Famotidine as Dr. Gala Romney recommended. Pt noticed that he is still having diarrhea. Pt was asked if he is taking anything to help his bowels move. Pt is taking 2 Colace pills at night. Per pt, he is going to reduce to 1 Colace to see if the loose stool stops. Pt will call back if the diarrhea continues.   Routing message to Aliene Altes, [place] as an [place]. Pt called last week and his PPI was changed to Famotidine due to pts diarrhea. See note from Dr. Gala Romney.

## 2020-08-29 NOTE — Telephone Encounter (Signed)
Patient called and said that he took the new medication he was prescribed and it made him have diarrhea again.  Michela Pitcher he had to miss church this weekend

## 2020-08-29 NOTE — Telephone Encounter (Signed)
Noted. Agree with recommendations. If he is having diarrhea, he does not need to take Colace. He can discontinue Colace completely if needed. Call back with any ongoing symptoms. Continue Famotidine for now.

## 2020-08-29 NOTE — Telephone Encounter (Signed)
Lmom, waiting on a return call.  

## 2020-08-29 NOTE — Telephone Encounter (Signed)
Spoke with pt. Pt was notified of Aliene Altes, PA's recommendations and will d/c Colace. Pt will call back if he continues to have diarrhea.

## 2020-08-30 NOTE — Telephone Encounter (Signed)
Pt called back this morning 08/30/20 and states that he d/c the colace and took only 1 Famotidine and  Diarrhea all night. Pt states that he doesn't feel it's anything that he is eating. Pt wants to discuss taking another medication or finding out what he can do.

## 2020-08-30 NOTE — Telephone Encounter (Signed)
Spoke with patient. Diarrhea stopped after he discontinued stool softeners. He has been having trouble with increased urination at night. No dysuria. I have advised he continue famotidine as prescribed and reach out to PCP to discuss nocturia.

## 2020-08-31 ENCOUNTER — Telehealth: Payer: Self-pay | Admitting: Internal Medicine

## 2020-08-31 NOTE — Telephone Encounter (Signed)
Spoke with patient.  States he reached out to his nurse coordinator at [facility] to let them know he was started on a different medication (famotidine).  They advised that he only take one a day.  He is not sure why.  He states his nurse coordinator is [employer]  He gave me the number to [facility] transplant center [phone].  I tried to call this afternoon, but their hours are only to 4:30 PM.  We will try again tomorrow.

## 2020-08-31 NOTE — Telephone Encounter (Signed)
Patient called to let you know that the transplant center said he should take one Famotidine

## 2020-08-31 NOTE — Telephone Encounter (Signed)
Pt called back and will answer when Aliene Altes, PA calls back.

## 2020-08-31 NOTE — Telephone Encounter (Signed)
Tried to call patient to discuss.  No answer.  Left message on machine requesting return call.  [name]

## 2020-08-31 NOTE — Telephone Encounter (Signed)
Routing to [employer], [place] as an [place]. Previous message sent about pts diarrhea.

## 2020-09-05 NOTE — Telephone Encounter (Signed)
Spoke with patient's nurse coordinator, [facility].  States she responded to an email from the patient and advised that he was fine to take famotidine.  He did not specify whether he needed to take famotidine daily or twice daily.  Lanelle Bal states patient is fine to take famotidine twice daily.  Elmo Putt, please let patient know the above.  He can resume famotidine twice daily.

## 2020-09-05 NOTE — Telephone Encounter (Signed)
Called transplant center today and advised that patient's nurse coordinator, [facility], is still in clinic.  Left message requesting return call.

## 2020-09-06 NOTE — Telephone Encounter (Signed)
Spoke with pt. Pt is aware that he can resume to Famotidine twice daily.

## 2020-09-15 ENCOUNTER — Ambulatory Visit: Payer: PPO | Admitting: Orthopedic Surgery

## 2020-09-15 ENCOUNTER — Encounter: Payer: Self-pay | Admitting: Orthopedic Surgery

## 2020-09-15 ENCOUNTER — Ambulatory Visit: Payer: PPO

## 2020-09-15 ENCOUNTER — Other Ambulatory Visit: Payer: Self-pay

## 2020-09-15 VITALS — BP 157/89 | HR 80 | Ht 69.0 in | Wt 260.0 lb

## 2020-09-15 DIAGNOSIS — E65 Localized adiposity: Secondary | ICD-10-CM | POA: Insufficient documentation

## 2020-09-15 DIAGNOSIS — S83241A Other tear of medial meniscus, current injury, right knee, initial encounter: Secondary | ICD-10-CM | POA: Diagnosis not present

## 2020-09-15 DIAGNOSIS — W009XXA Unspecified fall due to ice and snow, initial encounter: Secondary | ICD-10-CM | POA: Diagnosis not present

## 2020-09-15 DIAGNOSIS — G8929 Other chronic pain: Secondary | ICD-10-CM

## 2020-09-15 DIAGNOSIS — E1142 Type 2 diabetes mellitus with diabetic polyneuropathy: Secondary | ICD-10-CM | POA: Insufficient documentation

## 2020-09-15 DIAGNOSIS — M25561 Pain in right knee: Secondary | ICD-10-CM
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
## 2020-09-15 NOTE — Patient Instructions (Addendum)
While we are working on your approval please go ahead and call to schedule your appointment with [facility] in at least one (1) week.   Central Scheduling [phone]   Meniscus Tear  A meniscus tear is a knee injury that happens when a piece of the meniscus is torn. The meniscus is a thick, rubbery, wedge-shaped piece of cartilage in the knee. Each knee has two menisci sitting between the upper bone (femur) and lower bone (tibia) that form the knee joint. Each meniscus acts as a shock absorber for the knee. A torn meniscus is a common knee injury, ranging from mild to severe. Surgery may be needed to repair a severe tear. What are the causes? This condition may be caused by kneeling, squatting, twisting, or pivoting movements. Sports-related injuries are the most common cause, often resulting from:  Running and stopping suddenly.  Changing direction.  Being tackled or knocked off your feet.  Lifting or carrying heavy weights. As people get older, their menisci get thinner and weaker. Tears can happen more easily in older people, for example, when climbing stairs. What increases the risk? You are more likely to develop this condition if you:  Play contact sports.  Have a job that requires kneeling or squatting.  Are male.  Are over 19 years old. What are the signs or symptoms? Symptoms of this condition include:  Knee pain, especially at the side of the knee joint. You may feel pain immediately after injury, or hear a pop and feel pain later.  A feeling that your knee is clicking, catching, locking, or giving way (weakness, instability).  Not being able to fully bend or extend your knee.  Bruising or swelling in your knee. How is this diagnosed? This condition may be diagnosed based on your symptoms and a physical exam. You may also have tests, such as:  X-rays.  MRI.  Arthroscopy. This is a procedure to look inside your knee with a narrow surgical  telescope. You may be referred to a knee specialist (orthopedic surgeon). How is this treated? Treatment for this injury depends on the severity of the tear. Treatment for a mild tear may include:  Rest.  Medicine to reduce pain and swelling, usually a nonsteroidal anti-inflammatory drug (NSAID), like ibuprofen.  A knee brace, sleeve, or wrap.  Using crutches or a walker to keep weight off your knee and help with walking.  Exercises to strengthen your knee (physical therapy). You may need surgery if you have a severe tear or if other treatments fail. Follow these instructions at home: If you have a brace, sleeve, or wrap:  Wear it, as told by your health care provider. Remove it, only as told by your health care provider.  Loosen the brace, sleeve, or wrap if your toes tingle, become numb, or turn cold and blue.  Keep the brace, sleeve, or wrap clean.  If the brace, sleeve, or wrap is not waterproof: ? Do not let it get wet. ? Cover it with a watertight covering when you take a bath or shower. Managing pain, stiffness, and swelling  Take over-the-counter and prescription medicines only as told by your health care provider.  If directed, put ice on your knee. To do this: ? If you have a removable brace, sleeve, or wrap, remove it as told by your health care provider. ? Put ice in a plastic bag. ? Place a towel between your skin and the bag. ? Leave the ice on for 20 minutes, 2-3  times per day. ? Remove the ice if your skin turns bright red. This is very important. If you cannot feel pain, heat, or cold, you have a greater risk of damage to the area.  Move your toes often to reduce stiffness and swelling.  Raise (elevate) the injured area above the level of your heart while you are sitting or lying down.   Activity  Do not use the injured limb to support your body weight until your health care provider says that you can. Use crutches or a walker as told by your health care  provider.  Return to your normal activities as told by your health care provider. Ask your health care provider what activities are safe for you.  Perform range-of-motion exercises only as told by your health care provider.  Begin doing exercises to strengthen your knee and leg muscles only as told by your health care provider. After you recover, your health care provider may recommend these exercises to help prevent another injury. General instructions  Use a knee brace, sleeve, or wrap as told by your health care provider.  Ask your health care provider when it is safe to drive if you have a brace, sleeve, or wrap on your knee.  Do not use any products that contain nicotine or tobacco, such as cigarettes, e-cigarettes, and chewing tobacco. If you need help quitting, ask your health care provider.  Ask your health care provider if the medicine prescribed to you: ? Requires you to avoid driving or using heavy machinery. ? Can cause constipation. You may need to take these actions to prevent or treat constipation:  Drink enough fluid to keep your urine pale yellow.  Take over-the-counter or prescription medicines.  Eat foods that are high in fiber, such as beans, whole grains, and fresh fruits and vegetables.  Limit foods that are high in fat and processed sugars, such as fried or sweet foods.  Keep all follow-up visits. This is important. Contact a health care provider if:  You have a fever.  Your knee becomes red, tender, or swollen.  Your pain medicine is not controlling your pain.  Your symptoms get worse or do not improve after 2 weeks of home care. Summary  A meniscus tear is a knee injury that happens when a piece of the meniscus is torn.  Treatment for this injury depends on the severity of the tear. You may need surgery if you have a severe tear or if other treatments fail.  Rest, ice, and raise (elevate) your injured knee, as told by your health care provider. This  will help lessen pain and swelling.  Contact a health care provider if you have new symptoms, your symptoms worsen, or they do not improve after 2 weeks of home care.  Keep all follow-up visits. This is important. This information is not intended to replace advice given to you by your health care provider. Make sure you discuss any questions you have with your health care provider. Document Revised: 11/05/2019 Document Reviewed: 11/05/2019 Elsevier Patient Education  [place].

## 2020-09-29 ENCOUNTER — Ambulatory Visit: Payer: PPO | Admitting: Orthopedic Surgery

## 2020-10-06 ENCOUNTER — Ambulatory Visit (HOSPITAL_COMMUNITY)
Admission: RE | Admit: 2020-10-06 | Discharge: 2020-10-06 | Disposition: A | Discharge status: Home / Self Care | Payer: PPO | Source: Ambulatory Visit | Attending: Orthopedic Surgery | Admitting: Orthopedic Surgery

## 2020-10-06 DIAGNOSIS — G8929 Other chronic pain: Secondary | ICD-10-CM | POA: Insufficient documentation

## 2020-10-06 DIAGNOSIS — M25561 Pain in right knee: Secondary | ICD-10-CM | POA: Insufficient documentation

## 2020-10-13 ENCOUNTER — Ambulatory Visit: Payer: PPO | Admitting: Orthopedic Surgery

## 2020-10-13 ENCOUNTER — Encounter: Payer: Self-pay | Admitting: Orthopedic Surgery

## 2020-10-13 ENCOUNTER — Other Ambulatory Visit: Payer: Self-pay

## 2020-10-13 VITALS — BP 155/84 | HR 89 | Ht 69.5 in | Wt 267.0 lb

## 2020-10-13 DIAGNOSIS — M23251 Derangement of posterior horn of lateral meniscus due to old tear or injury, right knee: Secondary | ICD-10-CM

## 2020-10-13 DIAGNOSIS — M23221 Derangement of posterior horn of medial meniscus due to old tear or injury, right knee: Secondary | ICD-10-CM | POA: Diagnosis not present

## 2020-10-13 DIAGNOSIS — M1711 Unilateral primary osteoarthritis, right knee: Secondary | ICD-10-CM

## 2020-10-13 DIAGNOSIS — G8929 Other chronic pain: Secondary | ICD-10-CM

## 2020-10-13 NOTE — Progress Notes (Signed)
Chief Complaint  Patient presents with  . Knee Pain    Right/ feels better, here to review MRI    Encounter Diagnosis  Name Primary?  . Chronic pain of right knee Yes    80 year old male fell in January had persistent pain in his right knee was sent for MRI.  He says his knee does not bother him now.  His MRI was read and I read that he does have the 2 medial lateral meniscal tears some arthritis in the patellofemoral joint as well as the other joints  IMPRESSION: Tears in the posterior horns of both the medial and lateral menisci as described above.  Mild appearing osteoarthritis is worst in the patellofemoral compartment.   Electronically Signed   By: Inge Rise M.D.   On: 10/06/2020 14:03   Based on his symptoms I would not recommend surgery at this time

## 2020-10-14 DIAGNOSIS — Z48298 Encounter for aftercare following other organ transplant: Secondary | ICD-10-CM | POA: Diagnosis not present

## 2020-10-14 DIAGNOSIS — Z5181 Encounter for therapeutic drug level monitoring: Secondary | ICD-10-CM | POA: Diagnosis not present

## 2020-10-14 DIAGNOSIS — Z8673 Personal history of transient ischemic attack (TIA), and cerebral infarction without residual deficits: Secondary | ICD-10-CM | POA: Diagnosis not present

## 2020-10-14 DIAGNOSIS — I10 Essential (primary) hypertension: Secondary | ICD-10-CM | POA: Diagnosis not present

## 2020-10-14 DIAGNOSIS — Z944 Liver transplant status: Secondary | ICD-10-CM | POA: Diagnosis not present

## 2020-10-14 DIAGNOSIS — E119 Type 2 diabetes mellitus without complications: Secondary | ICD-10-CM | POA: Diagnosis not present

## 2020-10-14 DIAGNOSIS — Z9225 Personal history of immunosupression therapy: Secondary | ICD-10-CM | POA: Diagnosis not present

## 2020-10-14 DIAGNOSIS — Z79899 Other long term (current) drug therapy: Secondary | ICD-10-CM | POA: Diagnosis not present

## 2020-10-27 DIAGNOSIS — Z23 Encounter for immunization: Secondary | ICD-10-CM | POA: Diagnosis not present

## 2020-11-11 DIAGNOSIS — Z944 Liver transplant status: Secondary | ICD-10-CM | POA: Diagnosis not present

## 2020-11-11 DIAGNOSIS — Z48298 Encounter for aftercare following other organ transplant: Secondary | ICD-10-CM | POA: Diagnosis not present

## 2020-11-11 DIAGNOSIS — Z79899 Other long term (current) drug therapy: Secondary | ICD-10-CM | POA: Diagnosis not present

## 2020-11-16 ENCOUNTER — Ambulatory Visit (INDEPENDENT_AMBULATORY_CARE_PROVIDER_SITE_OTHER): Payer: PPO | Admitting: "Endocrinology

## 2020-11-16 ENCOUNTER — Other Ambulatory Visit: Payer: Self-pay

## 2020-11-16 ENCOUNTER — Ambulatory Visit: Payer: PPO | Admitting: Nurse Practitioner

## 2020-11-16 ENCOUNTER — Encounter: Payer: Self-pay | Admitting: "Endocrinology

## 2020-11-16 VITALS — BP 152/85 | HR 80 | Ht 69.5 in | Wt 263.0 lb

## 2020-11-16 DIAGNOSIS — E782 Mixed hyperlipidemia: Secondary | ICD-10-CM | POA: Diagnosis not present

## 2020-11-16 DIAGNOSIS — E1122 Type 2 diabetes mellitus with diabetic chronic kidney disease: Secondary | ICD-10-CM | POA: Diagnosis not present

## 2020-11-16 DIAGNOSIS — Z794 Long term (current) use of insulin: Secondary | ICD-10-CM | POA: Diagnosis not present

## 2020-11-16 DIAGNOSIS — E039 Hypothyroidism, unspecified: Secondary | ICD-10-CM | POA: Diagnosis not present

## 2020-11-16 DIAGNOSIS — E1129 Type 2 diabetes mellitus with other diabetic kidney complication: Secondary | ICD-10-CM | POA: Diagnosis not present

## 2020-11-16 DIAGNOSIS — I10 Essential (primary) hypertension: Secondary | ICD-10-CM | POA: Diagnosis not present

## 2020-11-16 DIAGNOSIS — N1831 Chronic kidney disease, stage 3a: Secondary | ICD-10-CM | POA: Diagnosis not present

## 2020-11-16 LAB — POCT GLYCOSYLATED HEMOGLOBIN (HGB A1C): Hemoglobin A1C: 7.3 % — AB (ref 4.0–5.6)

## 2020-11-16 NOTE — Patient Instructions (Signed)

## 2020-11-16 NOTE — Progress Notes (Signed)
11/16/2020              Endocrinology follow-up note   Subjective:    Patient ID: [name], male    DOB: February 02, 1941, PCP Sharilyn Sites, MD   Past Medical History:  Diagnosis Date  . Anemia   . Biliary calculi, common bile duct   . CRI (chronic renal insufficiency)   . DM (diabetes mellitus) ([place])    Type II  . Elevated liver enzymes   . Esophageal varices ([place]) 03/19/11   mrcp  . History of ERCP 03/21/2011   Extrahepatic biliary obstruction secondary to occluded biliary stent and colon no cholelithiasis, status post sphincterotomy, stent removal and replacement and stone extraction.  Marland Kitchen HTN (hypertension)   . Hypothyroidism   . NASH (nonalcoholic steatohepatitis) 2006   liver transplant  . Pancreatitis chronic 03/19/11   mrcp  . S/P colonoscopy with polypectomy 2004   Dr Gala Romney  . S/P endoscopy 03/2004   Hx esophgaeal varices, pyloric channel ulcer prior to transplant  . Squamous cell cancer of external ear    pinna  . Stroke ([place])    "mini-stroke, weakness of left leg".   Past Surgical History:  Procedure Laterality Date  . BILE DUCT STENT PLACEMENT  03/21/11   Dr. Gala Romney  . BIOPSY  02/12/2020   Procedure: BIOPSY;  Surgeon: Montez Morita, Quillian Quince, MD;  Location: [facility];  Service: Gastroenterology;;  . CATARACT EXTRACTION Bilateral   . CHOLECYSTECTOMY  2006  . COLONOSCOPY  08/12/2002   Dr. Gala Romney- somewhat diffusely friable rectum and colon, polyp at 30 cm.  . COLONOSCOPY N/A 09/08/2012   Procedure: COLONOSCOPY;  Surgeon: Daneil Dolin, MD;  colonic diverticulosis. Repeat in 10 years.   Marland Kitchen ERCP  03/21/2011   Dr. Gala Romney- ERCP with removal of pediatric feeding tube, endoscopic sphinecterotomy with balloon dilation of ampulla followed by stone extraction and stent placement  . ESOPHAGOGASTRODUODENOSCOPY  03/20/2004   Dr. Nash Mantis 2 esophageal varices o/w normal esophageal mucosa, mild changes of snake skin in the gastric mucosa consistent with portal gastropathy,  multiple antral erosions  and  a single pyloric channel ulcer benign appearance o/w normal gastric mucosa, normal D1 and S2  . ESOPHAGOGASTRODUODENOSCOPY (EGD) WITH PROPOFOL N/A 02/12/2020   Procedure: ESOPHAGOGASTRODUODENOSCOPY (EGD) WITH PROPOFOL;  Surgeon: Montez Morita, Quillian Quince, MD; Food impaction in the middle third of the esophagus s/p removal, three superficial esophageal ulcerations with no stigmata of recent bleeding, one cratered nonnecrotic esophageal ulcer with no stigmata of recent bleed in the mid esophagus s/p biopsied. Path consistet with ulcer.   . ESOPHAGOGASTRODUODENOSCOPY (EGD) WITH PROPOFOL N/A 08/22/2020   Procedure: ESOPHAGOGASTRODUODENOSCOPY (EGD) WITH PROPOFOL;  Surgeon: Daneil Dolin, MD;  Location: [facility];  Service: Endoscopy;  Laterality: N/A;  7:30am  . KNEE ARTHROSCOPY WITH EXCISION PLICA Left 6/94/8546   Procedure: KNEE ARTHROSCOPY WITH EXCISION PLICA;  Surgeon: Carole Civil, MD;  Location: [facility];  Service: Orthopedics;  Laterality: Left;  . KNEE ARTHROSCOPY WITH MEDIAL MENISECTOMY Left 11/21/2012   Procedure: KNEE ARTHROSCOPY WITH MEDIAL AND LATERAL MENISECTOMY;  Surgeon: Carole Civil, MD;  Location: [facility];  Service: Orthopedics;  Laterality: Left;  . LIVER TRANSPLANT  2006   UVA-NASH cirrhosis  . MALONEY DILATION N/A 08/22/2020   Procedure: Venia Minks DILATION;  Surgeon: Daneil Dolin, MD;  Location: [facility];  Service: Endoscopy;  Laterality: N/A;  . MOUTH SURGERY  may 2012  . MRCP  03/19/11   biliary ductal dilatation- stones. esophageal  varices  . SPHINCTEROTOMY  03/21/2011   Procedure: SPHINCTEROTOMY;  Surgeon: Daneil Dolin, MD;  Location: [facility];  Service: Gastroenterology;;   Social History   Socioeconomic History  . Marital status: Married    Spouse name: Not on file  . Number of children: 1  . Years of education: 62  . Highest education level: Not on file  Occupational History  . Occupation: RETIRED    Employer: RETIRED   Tobacco Use  . Smoking status: Never Smoker  . Smokeless tobacco: Never Used  Vaping Use  . Vaping Use: Never used  Substance and Sexual Activity  . Alcohol use: No  . Drug use: No  . Sexual activity: Yes  Other Topics Concern  . Not on file  Social History Narrative  . Not on file   Social Determinants of Health   Financial Resource Strain: Not on file  Food Insecurity: Not on file  Transportation Needs: Not on file  Physical Activity: Not on file  Stress: Not on file  Social Connections: Not on file   Outpatient Encounter Medications as of 11/16/2020  Medication Sig  . aspirin 325 MG tablet Take 1 tablet (325 mg total) by mouth daily.  Marland Kitchen docusate sodium (COLACE) 100 MG capsule Take 100 mg by mouth daily.  . famotidine (PEPCID) 20 MG tablet Take 1 tablet (20 mg total) by mouth 2 (two) times daily. (Patient taking differently: Take 20 mg by mouth daily.)  . insulin aspart (NOVOLOG FLEXPEN) 100 UNIT/ML FlexPen Inject 12-15 Units into the skin 3 (three) times daily with meals. Give per sliding scale three times a day  . insulin glargine (LANTUS SOLOSTAR) 100 UNIT/ML Solostar Pen Inject 34 Units into the skin daily at 10 pm.  . levothyroxine (SYNTHROID) 137 MCG tablet Take 1 tablet (137 mcg total) by mouth daily before breakfast.  . MAGNESIUM PO Take 133 mg by mouth 2 (two) times daily.  . Multiple Vitamins-Minerals (MULTIVITAMIN WITH MINERALS) tablet Take 1 tablet by mouth daily.    Marland Kitchen olmesartan (BENICAR) 40 MG tablet Take 40 mg by mouth daily.   Glory Rosebush ULTRA test strip TESTING FOUR TIMES DAILY.  Marland Kitchen predniSONE (DELTASONE) 20 MG tablet Take 20 mg by mouth daily as needed.  . tacrolimus (PROGRAF) 0.5 MG capsule Take 0.5 mg by mouth 2 (two) times daily.   . Tetrahydrozoline-PEG (EYE MOISTURIZING RELIEF OP) Place 1 drop into both eyes daily as needed (Dry eye).   No facility-administered encounter medications on file as of 11/16/2020.   ALLERGIES: Allergies  Allergen Reactions   . Contrast Media [Iodinated Diagnostic Agents] Rash   VACCINATION STATUS: Immunization History  Administered Date(s) Administered  . Influenza Split 02/13/2011, 04/29/2012, 04/20/2013, 05/09/2013  . Influenza, High Dose Seasonal PF 04/26/2015, 04/13/2017  . Influenza-Unspecified 04/08/2013  . Moderna Sars-Covid-2 Vaccination 07/30/2019, 09/04/2019  . Pneumococcal Conjugate-13 07/29/2014  . Pneumococcal Polysaccharide-23 03/17/2010, 04/16/2011    Diabetes He presents for his follow-up diabetic visit. He has type 2 diabetes mellitus. Onset time: He was diagnosed at approximate age of 81 years. His disease course has been worsening. There are no hypoglycemic associated symptoms. Pertinent negatives for hypoglycemia include no confusion, headaches, pallor or seizures. There are no diabetic associated symptoms. Pertinent negatives for diabetes include no chest pain, no fatigue, no polydipsia, no polyphagia, no polyuria and no weakness. There are no hypoglycemic complications. Symptoms are worsening. Diabetic complications include a CVA. Risk factors for coronary artery disease include diabetes mellitus, dyslipidemia, hypertension, male sex, obesity, sedentary  lifestyle and tobacco exposure. Current diabetic treatment includes intensive insulin program. He is compliant with treatment most of the time. His weight is fluctuating minimally. He is following a generally unhealthy diet. He rarely participates in exercise. His home blood glucose trend is fluctuating minimally. His breakfast blood glucose range is generally 140-180 mg/dl. His lunch blood glucose range is generally 140-180 mg/dl. His dinner blood glucose range is generally 140-180 mg/dl. His bedtime blood glucose range is generally 140-180 mg/dl. His overall blood glucose range is 140-180 mg/dl. (He presents with slightly above target glycemic profile overall.  His point-of-care A1c 7.3%, increasing from 6.9%.  More recently he documents near  target glycemic profile.  He did not document or report hypoglycemia.) An ACE inhibitor/angiotensin II receptor blocker is being taken. Eye exam is current.  Hyperlipidemia This is a chronic problem. The current episode started more than 1 year ago. The problem is controlled. Exacerbating diseases include diabetes, hypothyroidism and obesity. Pertinent negatives include no chest pain, myalgias or shortness of breath. He is currently on no antihyperlipidemic treatment. Risk factors for coronary artery disease include dyslipidemia, diabetes mellitus, hypertension, male sex, obesity and a sedentary lifestyle.  Hypertension This is a chronic problem. The current episode started more than 1 year ago. The problem is uncontrolled. Pertinent negatives include no chest pain, headaches, neck pain, palpitations or shortness of breath. Risk factors for coronary artery disease include dyslipidemia, family history, obesity, male gender and sedentary lifestyle. Past treatments include ACE inhibitors. Hypertensive end-organ damage includes CVA. He is status post liver transplant.. Identifiable causes of hypertension include a thyroid problem.  Thyroid Problem Presents for follow-up visit. Patient reports no constipation, diarrhea, fatigue or palpitations. The symptoms have been improving. Past treatments include levothyroxine. The following procedures have not been performed: thyroidectomy. His past medical history is significant for diabetes and hyperlipidemia.    Review of systems  Constitutional: + Minimally fluctuating body weight,  current  Body mass index is 38.28 kg/m. , no fatigue, no subjective hyperthermia, no subjective hypothermia     Objective:    BP (!) 152/85   Pulse 80   Ht 5' 9.5" (1.765 m)   Wt 263 lb (119.3 kg)   BMI 38.28 kg/m   Wt Readings from Last 3 Encounters:  11/16/20 263 lb (119.3 kg)  10/13/20 267 lb (121.1 kg)  09/15/20 260 lb (117.9 kg)     Physical Exam-  Limited  Constitutional:  Body mass index is 38.28 kg/m. , not in acute distress, normal state of mind   Results for orders placed or performed in visit on 11/16/20  HgB A1c  Result Value Ref Range   Hemoglobin A1C 7.3 (A) 4.0 - 5.6 %   HbA1c POC (<> result, manual entry)     HbA1c, POC (prediabetic range)     HbA1c, POC (controlled diabetic range)     Diabetic Labs (most recent): Lab Results  Component Value Date   HGBA1C 7.3 (A) 11/16/2020   HGBA1C 6.9 (H) 05/16/2020   HGBA1C 6.7 (A) 03/16/2020   Lipid Panel     Component Value Date/Time   CHOL 144 05/16/2020 0742   TRIG 96 05/16/2020 0742   HDL 39 (L) 05/16/2020 0742   CHOLHDL 3.7 05/16/2020 0742   CHOLHDL 4.3 04/13/2017 0547   VLDL 21 04/13/2017 0547   LDLCALC 87 05/16/2020 0742     Assessment & Plan:   1. Uncontrolled type 2 diabetes mellitus with complication, with CKD GFR of 49, CVA  -Recently he  had developed CVA requiring rehabilitation and physical therapy. He remains at a high risk for more acute and chronic complications of diabetes which include CAD, CVA, CKD, retinopathy, and neuropathy. These are all discussed in detail with the patient.    He presents with slightly above target glycemic profile overall.  His point-of-care A1c 7.3%, increasing from 6.9%.  More recently he documents near target glycemic profile.  He did not document or report hypoglycemia.  Glucose logs and insulin administration records pertaining to this visit,  to be scanned into patient's records.  Recent labs reviewed.   - I have re-counseled the patient on diet management   by adopting a carbohydrate restricted / protein rich  Diet.  - he acknowledges that there is a room for improvement in his food and drink choices. - Suggestion is made for him to avoid simple carbohydrates  from his diet including Cakes, Sweet Desserts, Ice Cream, Soda (diet and regular), Sweet Tea, Candies, Chips, Cookies, Store Bought Juices, Alcohol in Excess  of  1-2 drinks a day, Artificial Sweeteners,  Coffee Creamer, and "Sugar-free" Products, Lemonade. This will help patient to have more stable blood glucose profile and potentially avoid unintended weight gain.   - Patient is advised to stick to a routine mealtimes to eat 3 meals  a day and avoid unnecessary snacks ( to snack only to correct hypoglycemia).  - I have approached patient with the following individualized plan to manage diabetes and patient agrees.  -He will continue to require intensive treatment with basal/bolus insulin in order for him to maintain control of diabetes to target. -He is advised to continue Lantus 34 units nightly, continue NovoLog 12 units  3 times a day before meals plus correction, associated with strict monitoring of blood glucose 4 times a day - before meals and at bedtime and as needed. - He would have benefited  from continuous glucose monitoring, however he decided not to get it because he cannot afford the monthly co-pay.    -He is encouraged to call this clinic for hypoglycemia or hyperglycemia above 200. - Adjustments for hypo-and hyperglycemia is given in written instructions to the patient.  -He is not a candidate for metformin, SGLT2 inhibitors, nor incretin in therapy due to history of liver transplant.   2) BP/HTN: His blood pressure is not controlled to target.     He is advised to continue his current blood pressure medications including lisinopril 20 mg p.o. daily.     3) Lipids/HPL: His lipid panel is controlled with LDL at 87.  He is not on statins as a result of his history of liver failure status post liver transplant.      4)  Weight/Diet:  exercise, and carbohydrates information provided. His BMI 38.2.  He is a candidate for modest weight loss.  Carbs/calories restrictions and optimal exercise program discussed with him.  5) Primary hypothyroidism:  He is advised to continue levothyroxine 137 mcg p.o. daily before breakfast.     - We  discussed about the correct intake of his thyroid hormone, on empty stomach at fasting, with water, separated by at least 30 minutes from breakfast and other medications,  and separated by more than 4 hours from calcium, iron, multivitamins, acid reflux medications (PPIs). -Patient is made aware of the fact that thyroid hormone replacement is needed for life, dose to be adjusted by periodic monitoring of thyroid function tests.   5) Chronic Care/Health Maintenance:  -Patient is on ACEI/ARB  medications and encouraged  to continue to follow up with Ophthalmology, Podiatrist at least yearly or according to recommendations, and advised to  stay away from smoking. I have recommended yearly flu vaccine and pneumonia vaccination at least every 5 years; and  sleep for at least 7 hours a day. -His foot exam is normal today.  - I advised patient to maintain close follow up with Sharilyn Sites, MD for primary care needs.    I spent 40 minutes in the care of the patient today including review of labs from [place], Lipids, Thyroid Function, Hematology (current and previous including abstractions from other facilities); face-to-face time discussing  his blood glucose readings/logs, discussing hypoglycemia and hyperglycemia episodes and symptoms, medications doses, his options of short and long term treatment based on the latest standards of care / guidelines;  discussion about incorporating lifestyle medicine;  and documenting the encounter.    Please refer to Patient Instructions for Blood Glucose Monitoring and Insulin/Medications Dosing Guide"  in media tab for additional information. Please  also refer to " Patient Self Inventory" in the Media  tab for reviewed elements of pertinent patient history.  Danae Orleans participated in the discussions, expressed understanding, and voiced agreement with the above plans.  All questions were answered to his satisfaction. he is encouraged to contact clinic should he have  any questions or concerns prior to his return visit.   follow up plan: -Return in about 3 months (around 02/28/2021) for F/U with Pre-visit Labs, Meter, Logs, A1c here.Glade Lloyd, MD Phone: [phone]  Fax: [phone]  -  This note was partially dictated with voice recognition software. Similar sounding words can be transcribed inadequately or may not  be corrected upon review.  11/16/2020, 1:13 PM

## 2020-11-16 NOTE — Patient Instructions (Incomplete)

## 2020-12-02 DIAGNOSIS — B356 Tinea cruris: Secondary | ICD-10-CM | POA: Diagnosis not present

## 2020-12-02 DIAGNOSIS — R040 Epistaxis: Secondary | ICD-10-CM | POA: Diagnosis not present

## 2020-12-02 DIAGNOSIS — Z7901 Long term (current) use of anticoagulants: Secondary | ICD-10-CM | POA: Diagnosis not present

## 2020-12-02 DIAGNOSIS — Z6839 Body mass index (BMI) 39.0-39.9, adult: Secondary | ICD-10-CM | POA: Diagnosis not present

## 2020-12-14 DIAGNOSIS — R040 Epistaxis: Secondary | ICD-10-CM | POA: Diagnosis not present

## 2020-12-21 ENCOUNTER — Other Ambulatory Visit: Payer: Self-pay | Admitting: "Endocrinology

## 2020-12-22 DIAGNOSIS — Z653 Problems related to other legal circumstances: Secondary | ICD-10-CM | POA: Diagnosis not present

## 2021-01-04 DIAGNOSIS — Z653 Problems related to other legal circumstances: Secondary | ICD-10-CM | POA: Diagnosis not present

## 2021-01-11 DIAGNOSIS — R040 Epistaxis: Secondary | ICD-10-CM | POA: Diagnosis not present

## 2021-01-19 DIAGNOSIS — E113292 Type 2 diabetes mellitus with mild nonproliferative diabetic retinopathy without macular edema, left eye: Secondary | ICD-10-CM | POA: Diagnosis not present

## 2021-01-19 DIAGNOSIS — H43819 Vitreous degeneration, unspecified eye: Secondary | ICD-10-CM | POA: Diagnosis not present

## 2021-02-08 DIAGNOSIS — Z944 Liver transplant status: Secondary | ICD-10-CM | POA: Diagnosis not present

## 2021-02-08 DIAGNOSIS — Z48298 Encounter for aftercare following other organ transplant: Secondary | ICD-10-CM | POA: Diagnosis not present

## 2021-02-08 DIAGNOSIS — Z79899 Other long term (current) drug therapy: Secondary | ICD-10-CM | POA: Diagnosis not present

## 2021-02-08 DIAGNOSIS — E039 Hypothyroidism, unspecified: Secondary | ICD-10-CM | POA: Diagnosis not present

## 2021-02-09 LAB — TSH: TSH: 6.66 u[IU]/mL — ABNORMAL HIGH (ref 0.450–4.500)

## 2021-02-09 LAB — T4, FREE: Free T4: 1.03 ng/dL (ref 0.82–1.77)

## 2021-02-15 DIAGNOSIS — Z944 Liver transplant status: Secondary | ICD-10-CM | POA: Diagnosis not present

## 2021-02-15 DIAGNOSIS — E063 Autoimmune thyroiditis: Secondary | ICD-10-CM | POA: Diagnosis not present

## 2021-02-15 DIAGNOSIS — I639 Cerebral infarction, unspecified: Secondary | ICD-10-CM | POA: Diagnosis not present

## 2021-02-15 DIAGNOSIS — Z6838 Body mass index (BMI) 38.0-38.9, adult: Secondary | ICD-10-CM | POA: Diagnosis not present

## 2021-02-15 DIAGNOSIS — E039 Hypothyroidism, unspecified: Secondary | ICD-10-CM | POA: Diagnosis not present

## 2021-02-27 ENCOUNTER — Encounter: Payer: Self-pay | Admitting: Internal Medicine

## 2021-02-28 ENCOUNTER — Other Ambulatory Visit: Payer: Self-pay

## 2021-02-28 ENCOUNTER — Ambulatory Visit: Payer: PPO | Admitting: Nurse Practitioner

## 2021-02-28 ENCOUNTER — Encounter: Payer: Self-pay | Admitting: Nurse Practitioner

## 2021-02-28 VITALS — BP 166/98 | HR 80 | Ht 69.5 in | Wt 253.0 lb

## 2021-02-28 DIAGNOSIS — E1165 Type 2 diabetes mellitus with hyperglycemia: Secondary | ICD-10-CM | POA: Diagnosis not present

## 2021-02-28 DIAGNOSIS — E559 Vitamin D deficiency, unspecified: Secondary | ICD-10-CM | POA: Diagnosis not present

## 2021-02-28 DIAGNOSIS — Z794 Long term (current) use of insulin: Secondary | ICD-10-CM

## 2021-02-28 DIAGNOSIS — I10 Essential (primary) hypertension: Secondary | ICD-10-CM | POA: Diagnosis not present

## 2021-02-28 DIAGNOSIS — E1122 Type 2 diabetes mellitus with diabetic chronic kidney disease: Secondary | ICD-10-CM

## 2021-02-28 DIAGNOSIS — E782 Mixed hyperlipidemia: Secondary | ICD-10-CM

## 2021-02-28 DIAGNOSIS — E039 Hypothyroidism, unspecified: Secondary | ICD-10-CM | POA: Diagnosis not present

## 2021-02-28 DIAGNOSIS — N1831 Chronic kidney disease, stage 3a: Secondary | ICD-10-CM | POA: Diagnosis not present

## 2021-02-28 LAB — POCT GLYCOSYLATED HEMOGLOBIN (HGB A1C): Hemoglobin A1C: 7.1 % — AB (ref 4.0–5.6)

## 2021-02-28 NOTE — Patient Instructions (Signed)

## 2021-02-28 NOTE — Progress Notes (Signed)
02/28/2021              Endocrinology follow-up note   Subjective:    Patient ID: [name], male    DOB: 06-Jan-1941, PCP Sharilyn Sites, MD   Past Medical History:  Diagnosis Date   Anemia    Biliary calculi, common bile duct    CRI (chronic renal insufficiency)    DM (diabetes mellitus) (HCC)    Type II   Elevated liver enzymes    Esophageal varices ([name]) 03/19/11   mrcp   History of ERCP 03/21/2011   Extrahepatic biliary obstruction secondary to occluded biliary stent and colon no cholelithiasis, status post sphincterotomy, stent removal and replacement and stone extraction.   HTN (hypertension)    Hypothyroidism    NASH (nonalcoholic steatohepatitis) 2006   liver transplant   Pancreatitis chronic 03/19/11   mrcp   S/P colonoscopy with polypectomy 2004   Dr Gala Romney   S/P endoscopy 03/2004   Hx esophgaeal varices, pyloric channel ulcer prior to transplant   Squamous cell cancer of external ear    pinna   Stroke ([place])    "mini-stroke, weakness of left leg".   Past Surgical History:  Procedure Laterality Date   BILE DUCT STENT PLACEMENT  03/21/11   Dr. Gala Romney   BIOPSY  02/12/2020   Procedure: BIOPSY;  Surgeon: Harvel Quale, MD;  Location: [facility];  Service: Gastroenterology;;   CATARACT EXTRACTION Bilateral    CHOLECYSTECTOMY  2006   COLONOSCOPY  08/12/2002   Dr. Gala Romney- somewhat diffusely friable rectum and colon, polyp at 30 cm.   COLONOSCOPY N/A 09/08/2012   Procedure: COLONOSCOPY;  Surgeon: Daneil Dolin, MD;  colonic diverticulosis. Repeat in 10 years.    ERCP  03/21/2011   Dr. Gala Romney- ERCP with removal of pediatric feeding tube, endoscopic sphinecterotomy with balloon dilation of ampulla followed by stone extraction and stent placement   ESOPHAGOGASTRODUODENOSCOPY  03/20/2004   Dr. Nash Mantis 2 esophageal varices o/w normal esophageal mucosa, mild changes of snake skin in the gastric mucosa consistent with portal gastropathy, multiple antral erosions   and  a single pyloric channel ulcer benign appearance o/w normal gastric mucosa, normal D1 and S2   ESOPHAGOGASTRODUODENOSCOPY (EGD) WITH PROPOFOL N/A 02/12/2020   Procedure: ESOPHAGOGASTRODUODENOSCOPY (EGD) WITH PROPOFOL;  Surgeon: Montez Morita, Quillian Quince, MD; Food impaction in the middle third of the esophagus s/p removal, three superficial esophageal ulcerations with no stigmata of recent bleeding, one cratered nonnecrotic esophageal ulcer with no stigmata of recent bleed in the mid esophagus s/p biopsied. Path consistet with ulcer.    ESOPHAGOGASTRODUODENOSCOPY (EGD) WITH PROPOFOL N/A 08/22/2020   Procedure: ESOPHAGOGASTRODUODENOSCOPY (EGD) WITH PROPOFOL;  Surgeon: Daneil Dolin, MD;  [facility];  Service: Endoscopy;  Laterality: N/A;  7:30am   KNEE ARTHROSCOPY WITH EXCISION PLICA Left 0000000   Procedure: KNEE ARTHROSCOPY WITH EXCISION PLICA;  Surgeon: Carole Civil, MD;  Location: [facility];  Service: Orthopedics;  Laterality: Left;   KNEE ARTHROSCOPY WITH MEDIAL MENISECTOMY Left 11/21/2012   Procedure: KNEE ARTHROSCOPY WITH MEDIAL AND LATERAL MENISECTOMY;  Surgeon: Carole Civil, MD;  Location: [facility];  Service: Orthopedics;  Laterality: Left;   LIVER TRANSPLANT  2006   [facility]-NASH cirrhosis   MALONEY DILATION N/A 08/22/2020   Procedure: Venia Minks DILATION;  Surgeon: Daneil Dolin, MD;  Location: [facility];  Service: Endoscopy;  Laterality: N/A;   MOUTH SURGERY  may 2012   MRCP  03/19/11   biliary ductal dilatation- stones. esophageal  varices   SPHINCTEROTOMY  03/21/2011   Procedure: SPHINCTEROTOMY;  Surgeon: Daneil Dolin, MD;  Location: [facility];  Service: Gastroenterology;;   Social History   Socioeconomic History   Marital status: Married    Spouse name: Not on file   Number of children: 1   Years of education: 14   Highest education level: Not on file  Occupational History   Occupation: RETIRED    Employer: RETIRED  Tobacco Use   Smoking status: Never    Smokeless tobacco: Never  Vaping Use   Vaping Use: Never used  Substance and Sexual Activity   Alcohol use: No   Drug use: No   Sexual activity: Yes  Other Topics Concern   Not on file  Social History Narrative   Not on file   Social Determinants of Health   Financial Resource Strain: Not on file  Food Insecurity: Not on file  Transportation Needs: Not on file  Physical Activity: Not on file  Stress: Not on file  Social Connections: Not on file   Outpatient Encounter Medications as of 02/28/2021  Medication Sig   aspirin 325 MG tablet Take 1 tablet (325 mg total) by mouth daily.   docusate sodium (COLACE) 100 MG capsule Take 100 mg by mouth daily.   famotidine (PEPCID) 20 MG tablet Take 1 tablet (20 mg total) by mouth 2 (two) times daily. (Patient taking differently: Take 20 mg by mouth daily.)   insulin aspart (NOVOLOG FLEXPEN) 100 UNIT/ML FlexPen Inject 12-15 Units into the skin 3 (three) times daily with meals. Give per sliding scale three times a day   insulin glargine (LANTUS SOLOSTAR) 100 UNIT/ML Solostar Pen Inject 34 Units into the skin daily at 10 pm.   levothyroxine (SYNTHROID) 137 MCG tablet Take 1 tablet (137 mcg total) by mouth daily before breakfast.   MAGNESIUM PO Take 133 mg by mouth 2 (two) times daily.   Multiple Vitamins-Minerals (MULTIVITAMIN WITH MINERALS) tablet Take 1 tablet by mouth daily.     olmesartan (BENICAR) 40 MG tablet Take 40 mg by mouth daily.    ONETOUCH ULTRA test strip TESTING FOUR TIMES DAILY.   predniSONE (DELTASONE) 20 MG tablet Take 20 mg by mouth daily as needed.   tacrolimus (PROGRAF) 0.5 MG capsule Take 0.5 mg by mouth 2 (two) times daily.    [DISCONTINUED] Tetrahydrozoline-PEG (EYE MOISTURIZING RELIEF OP) Place 1 drop into both eyes daily as needed (Dry eye).   No facility-administered encounter medications on file as of 02/28/2021.   ALLERGIES: Allergies  Allergen Reactions   Contrast Media [Iodinated Diagnostic Agents] Rash    VACCINATION STATUS: Immunization History  Administered Date(s) Administered   Influenza Split 02/13/2011, 04/29/2012, 04/20/2013, 05/09/2013   Influenza, High Dose Seasonal PF 04/26/2015, 04/13/2017   Influenza-Unspecified 04/08/2013   Moderna Sars-Covid-2 Vaccination 07/30/2019, 09/04/2019   Pneumococcal Conjugate-13 07/29/2014   Pneumococcal Polysaccharide-23 03/17/2010, 04/16/2011    Diabetes He presents for his follow-up diabetic visit. He has type 2 diabetes mellitus. Onset time: He was diagnosed at approximate age of 26 years. His disease course has been improving. There are no hypoglycemic associated symptoms. Pertinent negatives for hypoglycemia include no confusion, headaches, pallor or seizures. There are no diabetic associated symptoms. Pertinent negatives for diabetes include no chest pain, no fatigue, no polydipsia, no polyphagia, no polyuria and no weakness. There are no hypoglycemic complications. Symptoms are stable. Diabetic complications include a CVA. Risk factors for coronary artery disease include diabetes mellitus, dyslipidemia, hypertension, male sex, obesity, sedentary lifestyle  and tobacco exposure. Current diabetic treatment includes intensive insulin program. He is compliant with treatment most of the time. His weight is decreasing steadily. He is following a generally healthy diet. Meal planning includes avoidance of concentrated sweets. He rarely participates in exercise. His home blood glucose trend is fluctuating minimally. His breakfast blood glucose range is generally 130-140 mg/dl. His lunch blood glucose range is generally 130-140 mg/dl. His dinner blood glucose range is generally 140-180 mg/dl. His bedtime blood glucose range is generally 180-200 mg/dl. (He presents today with his logs, no meter, showing stable glycemic profile with near target fasting and at goal postprandial readings.  His POCT A1c today is 7.1%, improving from last visit of 7.3%.  He is shocked  by this as he did have to go on oral prednisone for some time earlier this month.  His insurance also switched his insulin due to their preference list.  He denies any hypoglycemia.) An ACE inhibitor/angiotensin II receptor blocker is being taken. Eye exam is current.  Hyperlipidemia This is a chronic problem. The current episode started more than 1 year ago. The problem is controlled. Recent lipid tests were reviewed and are normal. Exacerbating diseases include diabetes, hypothyroidism and obesity. Pertinent negatives include no chest pain, myalgias or shortness of breath. He is currently on no antihyperlipidemic treatment. Compliance problems include adherence to exercise.  Risk factors for coronary artery disease include dyslipidemia, diabetes mellitus, hypertension, male sex, obesity and a sedentary lifestyle.  Hypertension This is a chronic problem. The current episode started more than 1 year ago. The problem is controlled. Pertinent negatives include no chest pain, headaches, neck pain, palpitations or shortness of breath. There are no associated agents to hypertension. Risk factors for coronary artery disease include dyslipidemia, family history, obesity, male gender and sedentary lifestyle. Past treatments include ACE inhibitors. The current treatment provides mild improvement. There are no compliance problems.  Hypertensive end-organ damage includes CVA. He is status post liver transplant.. Identifiable causes of hypertension include a thyroid problem.  Thyroid Problem Presents for follow-up visit. Patient reports no constipation, diarrhea, fatigue or palpitations. The symptoms have been stable. Past treatments include levothyroxine. The following procedures have not been performed: thyroidectomy. His past medical history is significant for diabetes and hyperlipidemia.   Review of systems  Constitutional: + steadily decreasing body weight,  current Body mass index is 36.83 kg/m. , no fatigue,  no subjective hyperthermia, no subjective hypothermia Eyes: no blurry vision, no xerophthalmia ENT: no sore throat, no nodules palpated in throat, no dysphagia/odynophagia, no hoarseness Cardiovascular: no chest pain, no shortness of breath, no palpitations, no leg swelling Respiratory: no cough, no shortness of breath Gastrointestinal: no nausea/vomiting/diarrhea Musculoskeletal: no muscle/joint aches Skin: no rashes, no hyperemia Neurological: no tremors, no numbness, no tingling, no dizziness Psychiatric: no depression, no anxiety   Objective:    BP (!) 166/98   Pulse 80   Ht 5' 9.5" (1.765 m)   Wt 253 lb (114.8 kg)   BMI 36.83 kg/m   Wt Readings from Last 3 Encounters:  02/28/21 253 lb (114.8 kg)  11/16/20 263 lb (119.3 kg)  10/13/20 267 lb (121.1 kg)     BP Readings from Last 3 Encounters:  02/28/21 (!) 166/98  11/16/20 (!) 152/85  10/13/20 (!) 155/84    Physical Exam- Limited  Constitutional:  Body mass index is 36.83 kg/m. , not in acute distress, normal state of mind Eyes:  EOMI, no exophthalmos Neck: Supple Cardiovascular: RRR, no murmurs, rubs, or gallops, no  edema Respiratory: Adequate breathing efforts, no crackles, rales, rhonchi, or wheezing Musculoskeletal: no gross deformities, strength intact in all four extremities, no gross restriction of joint movements Skin:  no rashes, no hyperemia Neurological: no tremor with outstretched hands     Results for orders placed or performed in visit on 02/28/21  HgB A1c  Result Value Ref Range   Hemoglobin A1C 7.1 (A) 4.0 - 5.6 %   HbA1c POC (<> result, manual entry)     HbA1c, POC (prediabetic range)     HbA1c, POC (controlled diabetic range)     Diabetic Labs (most recent): Lab Results  Component Value Date   HGBA1C 7.1 (A) 02/28/2021   HGBA1C 7.3 (A) 11/16/2020   HGBA1C 6.9 (H) 05/16/2020   Lipid Panel     Component Value Date/Time   CHOL 144 05/16/2020 0742   TRIG 96 05/16/2020 0742   HDL 39  (L) 05/16/2020 0742   CHOLHDL 3.7 05/16/2020 0742   CHOLHDL 4.3 04/13/2017 0547   VLDL 21 04/13/2017 0547   LDLCALC 87 05/16/2020 0742     Assessment & Plan:   1) Uncontrolled type 2 diabetes mellitus with complication, with CKD GFR of 49, CVA  -Recently he had developed CVA requiring rehabilitation and physical therapy. He remains at a high risk for more acute and chronic complications of diabetes which include CAD, CVA, CKD, retinopathy, and neuropathy. These are all discussed in detail with the patient.  He presents today with his logs, no meter, showing stable glycemic profile with near target fasting and at goal postprandial readings.  His POCT A1c today is 7.1%, improving from last visit of 7.3%.  He is shocked by this as he did have to go on oral prednisone for some time earlier this month.  His insurance also switched his insulin due to their preference list.  He denies any hypoglycemia.   Glucose logs and insulin administration records pertaining to this visit,  to be scanned into patient's records.  Recent labs reviewed.  - Nutritional counseling repeated at each appointment due to patients tendency to fall back in to old habits.  - The patient admits there is a room for improvement in their diet and drink choices. -  Suggestion is made for the patient to avoid simple carbohydrates from their diet including Cakes, Sweet Desserts / Pastries, Ice Cream, Soda (diet and regular), Sweet Tea, Candies, Chips, Cookies, Sweet Pastries, Store Bought Juices, Alcohol in Excess of 1-2 drinks a day, Artificial Sweeteners, Coffee Creamer, and "Sugar-free" Products. This will help patient to have stable blood glucose profile and potentially avoid unintended weight gain.   - I encouraged the patient to switch to unprocessed or minimally processed complex starch and increased protein intake (animal or plant source), fruits, and vegetables.   - Patient is advised to stick to a routine mealtimes to  eat 3 meals a day and avoid unnecessary snacks (to snack only to correct hypoglycemia).  - I have approached patient with the following individualized plan to manage diabetes and patient agrees.  -He will continue to require intensive treatment with basal/bolus insulin in order for him to maintain control of diabetes to target.  -Based on his improved and stable glycemic profile, he is advised to continue his current regimen of Lantus 34 units SQ nightly and Novolog 12-15 units TID with meals if glucose is above 90 and he is eating (Specific instructions on how to titrate insulin dosage based on glucose readings given to patient in writing).  -  He would have benefited  from continuous glucose monitoring, however he decided not to get it because he cannot afford the monthly co-pay.    -He is encouraged to call this clinic for hypoglycemia or hyperglycemia above 200. - Adjustments for hypo-and hyperglycemia is given in written instructions to the patient.  -He is not a candidate for metformin, SGLT2 inhibitors, nor incretin in therapy due to history of liver transplant.  2) BP/HTN:  His blood pressure is controlled to target for his age.  He is advised to continue his current blood pressure medications including Lisinopril 20 mg p.o. daily.     3) Lipids/HPL:  His most recent lipid panel from 05/16/20 shows controlled with LDL at 87.  He is not on statins as a result of his history of liver failure status post liver transplant.  Will recheck lipid panel prior to next visit.   4)  Weight/Diet:  His Body mass index is 36.83 kg/m. He is a candidate for continued weight loss.  Carbs/calories restrictions and optimal exercise program discussed with him.  5) Primary hypothyroidism:  His previsit thyroid function tests are consistent with slight under-replacement, however he reports he takes the rest of his medication about 5 minutes after taking his thyroid medication.  He is advised to continue  Levothyroxine 137 mcg po daily before breakfast but to start taking it properly to maximize absorption.    - We discussed about the correct intake of his thyroid hormone, on empty stomach at fasting, with water, separated by at least 30 minutes from breakfast and other medications,  and separated by more than 4 hours from calcium, iron, multivitamins, acid reflux medications (PPIs). -Patient is made aware of the fact that thyroid hormone replacement is needed for life, dose to be adjusted by periodic monitoring of thyroid function tests.  5) Chronic Care/Health Maintenance: -Patient is on ACEI/ARB  medications and encouraged to continue to follow up with Ophthalmology, Podiatrist at least yearly or according to recommendations, and advised to  stay away from smoking. I have recommended yearly flu vaccine and pneumonia vaccination at least every 5 years; and  sleep for at least 7 hours a day. -His foot exam is normal today.  - I advised patient to maintain close follow up with Sharilyn Sites, MD for primary care needs.      I spent 40 minutes in the care of the patient today including review of labs from [facility], Lipids, Thyroid Function, Hematology (current and previous including abstractions from other facilities); face-to-face time discussing  his blood glucose readings/logs, discussing hypoglycemia and hyperglycemia episodes and symptoms, medications doses, his options of short and long term treatment based on the latest standards of care / guidelines;  discussion about incorporating lifestyle medicine;  and documenting the encounter.    Please refer to Patient Instructions for Blood Glucose Monitoring and Insulin/Medications Dosing Guide"  in media tab for additional information. Please  also refer to " Patient Self Inventory" in the Media  tab for reviewed elements of pertinent patient history.  Danae Orleans participated in the discussions, expressed understanding, and voiced agreement with the  above plans.  All questions were answered to his satisfaction. he is encouraged to contact clinic should he have any questions or concerns prior to his return visit.   follow up plan: -Return in about 4 months (around 06/30/2021) for Diabetes F/U- A1c and UM in office, Previsit labs, Bring meter and logs, Thyroid follow up.    Rayetta Pigg, FNP-BC [facility] [address] [place], [place] [address] Phone: [phone] Fax: [phone]  02/28/2021, 11:43 AM

## 2021-03-02 DIAGNOSIS — Z961 Presence of intraocular lens: Secondary | ICD-10-CM | POA: Diagnosis not present

## 2021-03-02 DIAGNOSIS — H43811 Vitreous degeneration, right eye: Secondary | ICD-10-CM | POA: Diagnosis not present

## 2021-03-02 DIAGNOSIS — H26492 Other secondary cataract, left eye: Secondary | ICD-10-CM | POA: Diagnosis not present

## 2021-04-14 DIAGNOSIS — Z23 Encounter for immunization: Secondary | ICD-10-CM | POA: Diagnosis not present

## 2021-05-01 ENCOUNTER — Other Ambulatory Visit: Payer: Self-pay | Admitting: Internal Medicine

## 2021-05-02 ENCOUNTER — Ambulatory Visit: Payer: PPO | Admitting: Internal Medicine

## 2021-05-03 ENCOUNTER — Encounter: Payer: Self-pay | Admitting: Gastroenterology

## 2021-05-03 ENCOUNTER — Ambulatory Visit: Payer: PPO | Admitting: Gastroenterology

## 2021-05-03 ENCOUNTER — Other Ambulatory Visit: Payer: Self-pay

## 2021-05-03 VITALS — BP 150/79 | HR 74 | Temp 97.3°F | Ht 69.0 in | Wt 253.2 lb

## 2021-05-03 DIAGNOSIS — K219 Gastro-esophageal reflux disease without esophagitis: Secondary | ICD-10-CM | POA: Diagnosis not present

## 2021-05-03 DIAGNOSIS — Z944 Liver transplant status: Secondary | ICD-10-CM | POA: Diagnosis not present

## 2021-05-03 DIAGNOSIS — R1319 Other dysphagia: Secondary | ICD-10-CM | POA: Diagnosis not present

## 2021-05-03 NOTE — Patient Instructions (Signed)
Continue Pepcid 20mg  once to twice daily for acid reflux. Continue to monitor your swallowing, if you have recurrent issues please let us know. Continue to follow with [facility] regarding liver transplant. Return to the office in one year or call sooner if needed.

## 2021-05-03 NOTE — Progress Notes (Signed)
Primary Care Physician: Sharilyn Sites, MD  Primary Gastroenterologist:  Garfield Cornea, MD   Chief Complaint  Patient presents with   Dysphagia    Swallowing ok    HPI: [name] is a 80 y.o. male here for follow up. He has history of orthotopic liver transplantation for NASH cirrhosis in 2006. H/O choledocholithiasis in 2012 s/p ERCP with sphincterotomy and biliary stent replacement. Follows with [facility] for transplant. Up to date for colon cancer screening with last colonoscopy in 2014 (colonic diverticulosis). Last seen in 03/2020 for dysphagia.   Patient presented to the emergency room and underwent emergent EGD 02/12/2020 with Dr. Jenetta Downer.  Food impaction in the middle third of the esophagus s/p removal, three superficial esophageal ulcerations with no stigmata of recent bleeding, one cratered nonnecrotic esophageal ulcer with no stigmata of recent bleed in the mid esophagus s/p biopsied.he had persistent O2 desaturation in postop recovery area with O2 in mid 80s requiring oxygen by nasal cannula. Monitored overnight.   He had repeat EGD to verify healing.  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 Encouraged to restart his PPI at least one daily. He was unable to tolerate omeprazole or pantoprazole due to diarrhea. He was subsequently switched to pepcid 20mg  BID.  For the most part patient takes Pepcid once daily.  Feels like he is doing well.  No heartburn or dysphagia.  If he takes Pepcid more than once a day on a regular basis, he gets diarrhea.  Bowel movements otherwise are regular.  No blood in the stool or melena.  No abdominal pain.  [facility] virtual visit in 11/2020. Doing once per year. Blood work every 3 months.   EGD February 2022: -Esophagus dissecans. Submucosal web - status post dilation and disruption as described above -Normal stomach. - Normal duodenal bulb and second portion of the duodenum. - No specimens collected.   Current  Outpatient Medications  Medication Sig Dispense Refill   aspirin 325 MG tablet Take 1 tablet (325 mg total) by mouth daily. 30 tablet 2   docusate sodium (COLACE) 100 MG capsule Take 100 mg by mouth daily.     insulin aspart (NOVOLOG FLEXPEN) 100 UNIT/ML FlexPen Inject 12-15 Units into the skin 3 (three) times daily with meals. Give per sliding scale three times a day 30 mL 3   insulin glargine-yfgn (SEMGLEE) 100 UNIT/ML Pen Inject 34 Units into the skin at bedtime.     levothyroxine (SYNTHROID) 137 MCG tablet Take 1 tablet (137 mcg total) by mouth daily before breakfast. 30 tablet 6   MAGNESIUM PO Take 133 mg by mouth 2 (two) times daily.     Multiple Vitamins-Minerals (MULTIVITAMIN WITH MINERALS) tablet Take 1 tablet by mouth daily.       olmesartan (BENICAR) 40 MG tablet Take 40 mg by mouth daily.      ONETOUCH ULTRA test strip TESTING FOUR TIMES DAILY. 400 each 2   predniSONE (DELTASONE) 20 MG tablet Take 20 mg by mouth daily as needed.     tacrolimus (PROGRAF) 0.5 MG capsule Take 0.5 mg by mouth 2 (two) times daily.      famotidine (PEPCID) 20 MG tablet TAKE (1) TABLET BY MOUTH TWICE DAILY. 60 tablet 11   No current facility-administered medications for this visit.    Allergies as of 05/03/2021 - Review Complete 05/03/2021  Allergen Reaction Noted   Contrast media [iodinated diagnostic agents] Rash 03/27/2011    ROS:  General: Negative for anorexia,  weight loss, fever, chills, fatigue, weakness. ENT: Negative for hoarseness, difficulty swallowing , nasal congestion. CV: Negative for chest pain, angina, palpitations, dyspnea on exertion, peripheral edema.  Respiratory: Negative for dyspnea at rest, dyspnea on exertion, cough, sputum, wheezing.  GI: See history of present illness. GU:  Negative for dysuria, hematuria, urinary incontinence, urinary frequency, nocturnal urination.  Endo: Negative for unusual weight change.    Physical Examination:   BP (!) 150/79   Pulse 74    Temp (!) 97.3 F (36.3 C) (Temporal)   Ht 5\' 9"  (1.753 m)   Wt 253 lb 3.2 oz (114.9 kg)   BMI 37.39 kg/m   General: Well-nourished, well-developed in no acute distress.  Eyes: No icterus. Mouth:masked.  Abdomen: Bowel sounds are normal, nontender, nondistended, no hepatosplenomegaly or masses, no abdominal bruits or hernia , no rebound or guarding.   Extremities: No lower extremity edema. No clubbing or deformities. Neuro: Alert and oriented x 4   Skin: Warm and dry, no jaundice.   Psych: Alert and cooperative, normal mood and affect.  Labs:  Labs from August 2022: White blood cell count 11,600, hemoglobin 14.5, platelets 246,000, glucose 136, creatinine 1.33, sodium 143, albumin 3.9, total bilirubin 0.8, alkaline phosphatase 73, AST 16, ALT 18.  Imaging Studies: No results found.   Assessment:  80 year old male with history of food impaction requiring emergent EGD in August 2021.  EGD with removal of food, noted to have 3 superficial esophageal ulcers, 1 cratered nonnecrotic esophageal ulcer, pathology there was necrosis and inflammation, findings consistent with ulcer.  Subsequent follow-up EGD as outlined with esophagus dissecans.  Esophageal web status post dilation.  Clinically patient is doing much better.  He denies any typical reflux symptoms.  He did not tolerate omeprazole or pantoprazole stating it caused diarrhea.  Currently has been on Pepcid 20 mg once to twice daily with good control.  No further dysphagia.  History of orthotopic liver transplantation for [name] cirrhosis in 2006.  Continues to follow with the [facility] yearly.  Completes labs every 3 months.  He will continue to follow-up with him as usual.   Plan: Pepcid 20 mg once to twice daily. Call with any recurrent swallowing issues. Continue to follow with [facility] liver transplant follow-ups. Return to the office in 1 year or sooner if needed.

## 2021-05-12 DIAGNOSIS — Z944 Liver transplant status: Secondary | ICD-10-CM | POA: Diagnosis not present

## 2021-05-12 DIAGNOSIS — E559 Vitamin D deficiency, unspecified: Secondary | ICD-10-CM | POA: Diagnosis not present

## 2021-05-12 DIAGNOSIS — Z48298 Encounter for aftercare following other organ transplant: Secondary | ICD-10-CM | POA: Diagnosis not present

## 2021-05-12 DIAGNOSIS — Z79899 Other long term (current) drug therapy: Secondary | ICD-10-CM | POA: Diagnosis not present

## 2021-05-12 DIAGNOSIS — E1122 Type 2 diabetes mellitus with diabetic chronic kidney disease: Secondary | ICD-10-CM | POA: Diagnosis not present

## 2021-05-12 DIAGNOSIS — E039 Hypothyroidism, unspecified: Secondary | ICD-10-CM | POA: Diagnosis not present

## 2021-05-12 DIAGNOSIS — Z794 Long term (current) use of insulin: Secondary | ICD-10-CM | POA: Diagnosis not present

## 2021-05-12 DIAGNOSIS — N1831 Chronic kidney disease, stage 3a: Secondary | ICD-10-CM | POA: Diagnosis not present

## 2021-05-13 LAB — COMPREHENSIVE METABOLIC PANEL
ALT: 13 IU/L (ref 0–44)
AST: 18 IU/L (ref 0–40)
Albumin/Globulin Ratio: 1.3 (ref 1.2–2.2)
Albumin: 3.8 g/dL (ref 3.7–4.7)
Alkaline Phosphatase: 85 IU/L (ref 44–121)
BUN/Creatinine Ratio: 18 (ref 10–24)
BUN: 21 mg/dL (ref 8–27)
Bilirubin Total: 0.8 mg/dL (ref 0.0–1.2)
CO2: 25 mmol/L (ref 20–29)
Calcium: 9.2 mg/dL (ref 8.6–10.2)
Chloride: 106 mmol/L (ref 96–106)
Creatinine, Ser: 1.16 mg/dL (ref 0.76–1.27)
Globulin, Total: 3 g/dL (ref 1.5–4.5)
Glucose: 117 mg/dL — ABNORMAL HIGH (ref 70–99)
Potassium: 4.7 mmol/L (ref 3.5–5.2)
Sodium: 143 mmol/L (ref 134–144)
Total Protein: 6.8 g/dL (ref 6.0–8.5)
eGFR: 64 mL/min/{1.73_m2} (ref >59)

## 2021-05-13 LAB — LIPID PANEL
Chol/HDL Ratio: 3.4 ratio (ref 0.0–5.0)
Cholesterol, Total: 119 mg/dL (ref 100–199)
HDL: 35 mg/dL — ABNORMAL LOW (ref >39)
LDL Chol Calc (NIH): 67 mg/dL (ref 0–99)
Triglycerides: 85 mg/dL (ref 0–149)
VLDL Cholesterol Cal: 17 mg/dL (ref 5–40)

## 2021-05-13 LAB — T4, FREE: Free T4: 1.58 ng/dL (ref 0.82–1.77)

## 2021-05-13 LAB — TSH: TSH: 0.674 u[IU]/mL (ref 0.450–4.500)

## 2021-05-13 LAB — VITAMIN D 25 HYDROXY (VIT D DEFICIENCY, FRACTURES): Vit D, 25-Hydroxy: 35.2 ng/mL (ref 30.0–100.0)

## 2021-06-13 DIAGNOSIS — D692 Other nonthrombocytopenic purpura: Secondary | ICD-10-CM | POA: Diagnosis not present

## 2021-06-13 DIAGNOSIS — M1712 Unilateral primary osteoarthritis, left knee: Secondary | ICD-10-CM | POA: Diagnosis not present

## 2021-06-13 DIAGNOSIS — Z6836 Body mass index (BMI) 36.0-36.9, adult: Secondary | ICD-10-CM | POA: Diagnosis not present

## 2021-06-13 DIAGNOSIS — E119 Type 2 diabetes mellitus without complications: Secondary | ICD-10-CM | POA: Diagnosis not present

## 2021-07-05 ENCOUNTER — Ambulatory Visit: Payer: PPO | Admitting: Nurse Practitioner

## 2021-07-05 ENCOUNTER — Encounter: Payer: Self-pay | Admitting: Nurse Practitioner

## 2021-07-05 ENCOUNTER — Other Ambulatory Visit: Payer: Self-pay

## 2021-07-05 VITALS — BP 150/84 | HR 83 | Ht 69.0 in | Wt 251.0 lb

## 2021-07-05 DIAGNOSIS — Z794 Long term (current) use of insulin: Secondary | ICD-10-CM

## 2021-07-05 DIAGNOSIS — N182 Chronic kidney disease, stage 2 (mild): Secondary | ICD-10-CM | POA: Diagnosis not present

## 2021-07-05 DIAGNOSIS — I10 Essential (primary) hypertension: Secondary | ICD-10-CM | POA: Diagnosis not present

## 2021-07-05 DIAGNOSIS — E1122 Type 2 diabetes mellitus with diabetic chronic kidney disease: Secondary | ICD-10-CM | POA: Diagnosis not present

## 2021-07-05 DIAGNOSIS — E039 Hypothyroidism, unspecified: Secondary | ICD-10-CM

## 2021-07-05 DIAGNOSIS — E782 Mixed hyperlipidemia: Secondary | ICD-10-CM

## 2021-07-05 DIAGNOSIS — E559 Vitamin D deficiency, unspecified: Secondary | ICD-10-CM

## 2021-07-05 LAB — POCT GLYCOSYLATED HEMOGLOBIN (HGB A1C): HbA1c, POC (controlled diabetic range): 6.4 % (ref 0.0–7.0)

## 2021-07-05 MED ORDER — NOVOLOG FLEXPEN 100 UNIT/ML ~~LOC~~ SOPN: 8.0000 [IU] | PEN_INJECTOR | Freq: Three times a day (TID) | SUBCUTANEOUS | 3 refills | Status: DC

## 2021-07-05 MED ORDER — LEVOTHYROXINE SODIUM 137 MCG PO TABS: 137.0000 ug | ORAL_TABLET | Freq: Every day | ORAL | 6 refills | Status: DC

## 2021-07-05 MED ORDER — INSULIN GLARGINE-YFGN 100 UNIT/ML ~~LOC~~ SOPN: 20.0000 [IU] | PEN_INJECTOR | Freq: Every day | SUBCUTANEOUS | 3 refills | Status: DC

## 2021-07-05 NOTE — Patient Instructions (Signed)

## 2021-07-05 NOTE — Progress Notes (Signed)
07/05/2021              Endocrinology follow-up note   Subjective:    Patient ID: [name], male    DOB: 02/03/1941, PCP Sharilyn Sites, MD   Past Medical History:  Diagnosis Date   Anemia    Biliary calculi, common bile duct    CRI (chronic renal insufficiency)    DM (diabetes mellitus) (HCC)    Type II   Elevated liver enzymes    Esophageal varices ([place]) 03/19/11   mrcp   History of ERCP 03/21/2011   Extrahepatic biliary obstruction secondary to occluded biliary stent and colon no cholelithiasis, status post sphincterotomy, stent removal and replacement and stone extraction.   HTN (hypertension)    Hypothyroidism    NASH (nonalcoholic steatohepatitis) 2006   liver transplant   Pancreatitis chronic 03/19/11   mrcp   S/P colonoscopy with polypectomy 2004   Dr Gala Romney   S/P endoscopy 03/2004   Hx esophgaeal varices, pyloric channel ulcer prior to transplant   Squamous cell cancer of external ear    pinna   Stroke ([place])    "mini-stroke, weakness of left leg".   Past Surgical History:  Procedure Laterality Date   BILE DUCT STENT PLACEMENT  03/21/11   Dr. Gala Romney   BIOPSY  02/12/2020   Procedure: BIOPSY;  Surgeon: Harvel Quale, MD;  Location: [facility];  Service: Gastroenterology;;   CATARACT EXTRACTION Bilateral    CHOLECYSTECTOMY  2006   COLONOSCOPY  08/12/2002   Dr. Gala Romney- somewhat diffusely friable rectum and colon, polyp at 30 cm.   COLONOSCOPY N/A 09/08/2012   Procedure: COLONOSCOPY;  Surgeon: Daneil Dolin, MD;  colonic diverticulosis. Repeat in 10 years.    ERCP  03/21/2011   Dr. Gala Romney- ERCP with removal of pediatric feeding tube, endoscopic sphinecterotomy with balloon dilation of ampulla followed by stone extraction and stent placement   ESOPHAGOGASTRODUODENOSCOPY  03/20/2004   Dr. Nash Mantis 2 esophageal varices o/w normal esophageal mucosa, mild changes of snake skin in the gastric mucosa consistent with portal gastropathy, multiple antral erosions   and  a single pyloric channel ulcer benign appearance o/w normal gastric mucosa, normal D1 and S2   ESOPHAGOGASTRODUODENOSCOPY (EGD) WITH PROPOFOL N/A 02/12/2020   Procedure: ESOPHAGOGASTRODUODENOSCOPY (EGD) WITH PROPOFOL;  Surgeon: Montez Morita, Quillian Quince, MD; Food impaction in the middle third of the esophagus s/p removal, three superficial esophageal ulcerations with no stigmata of recent bleeding, one cratered nonnecrotic esophageal ulcer with no stigmata of recent bleed in the mid esophagus s/p biopsied. Path consistet with ulcer.    ESOPHAGOGASTRODUODENOSCOPY (EGD) WITH PROPOFOL N/A 08/22/2020   Procedure: ESOPHAGOGASTRODUODENOSCOPY (EGD) WITH PROPOFOL;  Surgeon: Daneil Dolin, MD;  [facility];  Service: Endoscopy;  Laterality: N/A;  7:30am   KNEE ARTHROSCOPY WITH EXCISION PLICA Left 0/93/8182   Procedure: KNEE ARTHROSCOPY WITH EXCISION PLICA;  Surgeon: Carole Civil, MD;  Location: [facility];  Service: Orthopedics;  Laterality: Left;   KNEE ARTHROSCOPY WITH MEDIAL MENISECTOMY Left 11/21/2012   Procedure: KNEE ARTHROSCOPY WITH MEDIAL AND LATERAL MENISECTOMY;  Surgeon: Carole Civil, MD;  Location: [facility];  Service: Orthopedics;  Laterality: Left;   LIVER TRANSPLANT  2006   UVA-NASH cirrhosis   MALONEY DILATION N/A 08/22/2020   Procedure: Venia Minks DILATION;  Surgeon: Daneil Dolin, MD;  Location: [facility];  Service: Endoscopy;  Laterality: N/A;   MOUTH SURGERY  may 2012   MRCP  03/19/11   biliary ductal dilatation- stones. esophageal  varices   SPHINCTEROTOMY  03/21/2011   Procedure: SPHINCTEROTOMY;  Surgeon: Daneil Dolin, MD;  Location: [facility];  Service: Gastroenterology;;   Social History   Socioeconomic History   Marital status: Married    Spouse name: Not on file   Number of children: 1   Years of education: 14   Highest education level: Not on file  Occupational History   Occupation: RETIRED    Employer: RETIRED  Tobacco Use   Smoking status: Never    Smokeless tobacco: Never  Vaping Use   Vaping Use: Never used  Substance and Sexual Activity   Alcohol use: No   Drug use: No   Sexual activity: Yes  Other Topics Concern   Not on file  Social History Narrative   Not on file   Social Determinants of Health   Financial Resource Strain: Not on file  Food Insecurity: Not on file  Transportation Needs: Not on file  Physical Activity: Not on file  Stress: Not on file  Social Connections: Not on file   Outpatient Encounter Medications as of 07/05/2021  Medication Sig   aspirin 325 MG tablet Take 1 tablet (325 mg total) by mouth daily.   docusate sodium (COLACE) 100 MG capsule Take 100 mg by mouth daily.   famotidine (PEPCID) 20 MG tablet TAKE (1) TABLET BY MOUTH TWICE DAILY.   insulin aspart (NOVOLOG FLEXPEN) 100 UNIT/ML FlexPen Inject 8-11 Units into the skin 3 (three) times daily with meals. Give per sliding scale three times a day   insulin glargine-yfgn (SEMGLEE) 100 UNIT/ML Pen Inject 20 Units into the skin at bedtime.   levothyroxine (SYNTHROID) 137 MCG tablet Take 1 tablet (137 mcg total) by mouth daily before breakfast.   MAGNESIUM PO Take 133 mg by mouth 2 (two) times daily.   Multiple Vitamins-Minerals (MULTIVITAMIN WITH MINERALS) tablet Take 1 tablet by mouth daily.     olmesartan (BENICAR) 40 MG tablet Take 40 mg by mouth daily.    ONETOUCH ULTRA test strip TESTING FOUR TIMES DAILY.   predniSONE (DELTASONE) 20 MG tablet Take 20 mg by mouth daily as needed.   tacrolimus (PROGRAF) 0.5 MG capsule Take 0.5 mg by mouth 2 (two) times daily.    [DISCONTINUED] insulin aspart (NOVOLOG FLEXPEN) 100 UNIT/ML FlexPen Inject 12-15 Units into the skin 3 (three) times daily with meals. Give per sliding scale three times a day   [DISCONTINUED] insulin glargine-yfgn (SEMGLEE) 100 UNIT/ML Pen Inject 34 Units into the skin at bedtime.   [DISCONTINUED] levothyroxine (SYNTHROID) 137 MCG tablet Take 1 tablet (137 mcg total) by mouth daily before  breakfast.   No facility-administered encounter medications on file as of 07/05/2021.   ALLERGIES: Allergies  Allergen Reactions   Contrast Media [Iodinated Contrast Media] Rash   VACCINATION STATUS: Immunization History  Administered Date(s) Administered   Influenza Split 02/13/2011, 04/29/2012, 04/20/2013, 05/09/2013   Influenza, High Dose Seasonal PF 04/26/2015, 04/13/2017   Influenza-Unspecified 04/08/2013   Moderna Sars-Covid-2 Vaccination 07/30/2019, 09/04/2019   Pneumococcal Conjugate-13 07/29/2014   Pneumococcal Polysaccharide-23 03/17/2010, 04/16/2011    Diabetes He presents for his follow-up diabetic visit. He has type 2 diabetes mellitus. Onset time: He was diagnosed at approximate age of 17 years. His disease course has been improving. Hypoglycemia symptoms include nervousness/anxiousness, sweats and tremors. Pertinent negatives for hypoglycemia include no confusion, headaches, pallor or seizures. There are no diabetic associated symptoms. Pertinent negatives for diabetes include no chest pain, no fatigue, no polydipsia, no polyphagia, no polyuria and no  weakness. Hypoglycemia complications include nocturnal hypoglycemia. Symptoms are stable. Diabetic complications include a CVA and nephropathy. Risk factors for coronary artery disease include diabetes mellitus, dyslipidemia, hypertension, male sex, obesity, sedentary lifestyle and tobacco exposure. Current diabetic treatment includes intensive insulin program. He is compliant with treatment most of the time. His weight is fluctuating minimally. He is following a generally healthy diet. Meal planning includes avoidance of concentrated sweets. He rarely participates in exercise. His home blood glucose trend is decreasing steadily. His breakfast blood glucose range is generally 90-110 mg/dl. His lunch blood glucose range is generally 90-110 mg/dl. His dinner blood glucose range is generally 140-180 mg/dl. His bedtime blood glucose  range is generally 110-130 mg/dl. (He presents today with his logs, no meter, showing tight fasting and at goal postprandial glucose readings.  His POCT A1c today is 6.4%, improving from last visit of 7.1%.  However, for his age, his target A1c should be 7.5% for safety purposes.  He has experienced frequent hypoglycemia, has adjusting his insulin accordingly.  ) An ACE inhibitor/angiotensin II receptor blocker is being taken. Eye exam is current.  Hyperlipidemia This is a chronic problem. The current episode started more than 1 year ago. The problem is controlled. Recent lipid tests were reviewed and are normal. Exacerbating diseases include diabetes, hypothyroidism and obesity. Pertinent negatives include no chest pain, myalgias or shortness of breath. He is currently on no antihyperlipidemic treatment. Compliance problems include adherence to exercise.  Risk factors for coronary artery disease include dyslipidemia, diabetes mellitus, hypertension, male sex, obesity and a sedentary lifestyle.  Hypertension This is a chronic problem. The current episode started more than 1 year ago. The problem has been resolved since onset. The problem is controlled. Associated symptoms include sweats. Pertinent negatives include no chest pain, headaches, neck pain, palpitations or shortness of breath. There are no associated agents to hypertension. Risk factors for coronary artery disease include dyslipidemia, family history, obesity, male gender and sedentary lifestyle. Past treatments include ACE inhibitors. The current treatment provides mild improvement. There are no compliance problems.  Hypertensive end-organ damage includes CVA. He is status post liver transplant.. Identifiable causes of hypertension include a thyroid problem.  Thyroid Problem Presents for follow-up visit. Symptoms include anxiety and tremors. Patient reports no constipation, diarrhea, fatigue or palpitations. The symptoms have been stable. Past  treatments include levothyroxine. The following procedures have not been performed: thyroidectomy. His past medical history is significant for diabetes and hyperlipidemia.    Review of systems  Constitutional: + Minimally fluctuating body weight,  current Body mass index is 37.07 kg/m. , no fatigue, no subjective hyperthermia, no subjective hypothermia Eyes: no blurry vision, no xerophthalmia ENT: no sore throat, no nodules palpated in throat, no dysphagia/odynophagia, no hoarseness Cardiovascular: no chest pain, no shortness of breath, no palpitations, no leg swelling Respiratory: no cough, no shortness of breath Gastrointestinal: no nausea/vomiting/diarrhea Musculoskeletal: no muscle/joint aches Skin: no rashes, no hyperemia Neurological: no tremors, no numbness, no tingling, no dizziness Psychiatric: no depression, no anxiety   Objective:    BP (!) 150/84    Pulse 83    Ht 5\' 9"  (1.753 m)    Wt 251 lb (113.9 kg)    BMI 37.07 kg/m   Wt Readings from Last 3 Encounters:  07/05/21 251 lb (113.9 kg)  05/03/21 253 lb 3.2 oz (114.9 kg)  02/28/21 253 lb (114.8 kg)     BP Readings from Last 3 Encounters:  07/05/21 (!) 150/84  05/03/21 (!) 150/79  02/28/21 Marland Kitchen)  166/98     Physical Exam- Limited  Constitutional:  Body mass index is 37.07 kg/m. , not in acute distress, normal state of mind Eyes:  EOMI, no exophthalmos Neck: Supple Cardiovascular: RRR, no murmurs, rubs, or gallops, no edema Respiratory: Adequate breathing efforts, no crackles, rales, rhonchi, or wheezing Musculoskeletal: no gross deformities, strength intact in all four extremities, no gross restriction of joint movements Skin:  no rashes, no hyperemia Neurological: no tremor with outstretched hands     Results for orders placed or performed in visit on 07/05/21  HgB A1c  Result Value Ref Range   Hemoglobin A1C     HbA1c POC (<> result, manual entry)     HbA1c, POC (prediabetic range)     HbA1c, POC  (controlled diabetic range) 6.4 0.0 - 7.0 %   Diabetic Labs (most recent): Lab Results  Component Value Date   HGBA1C 6.4 07/05/2021   HGBA1C 7.1 (A) 02/28/2021   HGBA1C 7.3 (A) 11/16/2020   Lipid Panel     Component Value Date/Time   CHOL 119 05/12/2021 0744   TRIG 85 05/12/2021 0744   HDL 35 (L) 05/12/2021 0744   CHOLHDL 3.4 05/12/2021 0744   CHOLHDL 4.3 04/13/2017 0547   VLDL 21 04/13/2017 0547   LDLCALC 67 05/12/2021 0744     Assessment & Plan:   1) Uncontrolled type 2 diabetes mellitus with complication, with CKD GFR of 49, CVA  -Recently he had developed CVA requiring rehabilitation and physical therapy. He remains at a high risk for more acute and chronic complications of diabetes which include CAD, CVA, CKD, retinopathy, and neuropathy. These are all discussed in detail with the patient.  He presents today with his logs, no meter, showing tight fasting and at goal postprandial glucose readings.  His POCT A1c today is 6.4%, improving from last visit of 7.1%.  However, for his age, his target A1c should be 7.5% for safety purposes.  He has experienced frequent hypoglycemia, has adjusting his insulin accordingly.     Glucose logs and insulin administration records pertaining to this visit,  to be scanned into patient's records.  Recent labs reviewed.  (He refused to have his UM checked today).  - Nutritional counseling repeated at each appointment due to patients tendency to fall back in to old habits.  - The patient admits there is a room for improvement in their diet and drink choices. -  Suggestion is made for the patient to avoid simple carbohydrates from their diet including Cakes, Sweet Desserts / Pastries, Ice Cream, Soda (diet and regular), Sweet Tea, Candies, Chips, Cookies, Sweet Pastries, Store Bought Juices, Alcohol in Excess of 1-2 drinks a day, Artificial Sweeteners, Coffee Creamer, and "Sugar-free" Products. This will help patient to have stable blood glucose  profile and potentially avoid unintended weight gain.   - I encouraged the patient to switch to unprocessed or minimally processed complex starch and increased protein intake (animal or plant source), fruits, and vegetables.   - Patient is advised to stick to a routine mealtimes to eat 3 meals a day and avoid unnecessary snacks (to snack only to correct hypoglycemia).  - I have approached patient with the following individualized plan to manage diabetes and patient agrees.  -He will continue to require intensive treatment with basal/bolus insulin in order for him to maintain control of diabetes to target.  Avoiding hypoglycemia is the number 1 priority in his care.  An acceptable A1c for him would be around 7.5%.  -Due to  his frequent hypoglycemia, his Lantus/Semglee will be reduced to 20 units SQ nightly and his Novolog will also be reduced to 8-11 units TID with meals if glucose is above 90 and he is eating (Specific instructions on how to titrate insulin dosage based on glucose readings given to patient in writing).   - He would have benefited  from continuous glucose monitoring, however he decided not to get it because he cannot afford the monthly co-pay.    -He is encouraged to call this clinic for hypoglycemia or hyperglycemia above 200. - Adjustments for hypo-and hyperglycemia is given in written instructions to the patient.  -He is not a candidate for metformin, SGLT2 inhibitors, nor incretin in therapy due to history of liver transplant.  2) BP/HTN:  His blood pressure is controlled to target for his age.  He is advised to continue his current blood pressure medications including Lisinopril 20 mg p.o. daily.     3) Lipids/HPL:  His most recent lipid panel from 05/12/21 shows controlled with LDL at 67.  He is not on statins as a result of his history of liver failure status post liver transplant.    4)  Weight/Diet:  His Body mass index is 37.07 kg/m. He is a candidate for continued  weight loss.  Carbs/calories restrictions and optimal exercise program discussed with him.  5) Primary hypothyroidism: -His previsit thyroid function tests are consistent with appropriate hormone replacement.  He is advised to continue Levothyroxine 137 mcg po daily before breakfast.     - We discussed about the correct intake of his thyroid hormone, on empty stomach at fasting, with water, separated by at least 30 minutes from breakfast and other medications,  and separated by more than 4 hours from calcium, iron, multivitamins, acid reflux medications (PPIs). -Patient is made aware of the fact that thyroid hormone replacement is needed for life, dose to be adjusted by periodic monitoring of thyroid function tests.  5) Chronic Care/Health Maintenance: -Patient is on ACEI/ARB  medications and encouraged to continue to follow up with Ophthalmology, Podiatrist at least yearly or according to recommendations, and advised to  stay away from smoking. I have recommended yearly flu vaccine and pneumonia vaccination at least every 5 years; and  sleep for at least 7 hours a day. -His foot exam is normal today.  - I advised patient to maintain close follow up with Sharilyn Sites, MD for primary care needs.     I spent 30 minutes in the care of the patient today including review of labs from [place], Lipids, Thyroid Function, Hematology (current and previous including abstractions from other facilities); face-to-face time discussing  his blood glucose readings/logs, discussing hypoglycemia and hyperglycemia episodes and symptoms, medications doses, his options of short and long term treatment based on the latest standards of care / guidelines;  discussion about incorporating lifestyle medicine;  and documenting the encounter.    Please refer to Patient Instructions for Blood Glucose Monitoring and Insulin/Medications Dosing Guide"  in media tab for additional information. Please  also refer to " Patient Self  Inventory" in the Media  tab for reviewed elements of pertinent patient history.  Danae Orleans participated in the discussions, expressed understanding, and voiced agreement with the above plans.  All questions were answered to his satisfaction. he is encouraged to contact clinic should he have any questions or concerns prior to his return visit.   follow up plan: -Return in about 4 months (around 11/03/2021) for Diabetes F/U with A1c  in office, No previsit labs, Bring meter and logs, Thyroid follow up.    Rayetta Pigg, [facility] [address] [place], [place] [address] Phone: [phone] Fax: [phone]  07/05/2021, 10:23 AM

## 2021-07-24 DIAGNOSIS — I639 Cerebral infarction, unspecified: Secondary | ICD-10-CM | POA: Diagnosis not present

## 2021-07-24 DIAGNOSIS — Z0000 Encounter for general adult medical examination without abnormal findings: Secondary | ICD-10-CM | POA: Diagnosis not present

## 2021-07-24 DIAGNOSIS — Z1331 Encounter for screening for depression: Secondary | ICD-10-CM | POA: Diagnosis not present

## 2021-07-24 DIAGNOSIS — E063 Autoimmune thyroiditis: Secondary | ICD-10-CM | POA: Diagnosis not present

## 2021-07-24 DIAGNOSIS — Z6837 Body mass index (BMI) 37.0-37.9, adult: Secondary | ICD-10-CM | POA: Diagnosis not present

## 2021-07-24 DIAGNOSIS — R7309 Other abnormal glucose: Secondary | ICD-10-CM | POA: Diagnosis not present

## 2021-07-24 DIAGNOSIS — Z944 Liver transplant status: Secondary | ICD-10-CM | POA: Diagnosis not present

## 2021-07-24 DIAGNOSIS — E039 Hypothyroidism, unspecified: Secondary | ICD-10-CM | POA: Diagnosis not present

## 2021-08-10 DIAGNOSIS — Z48298 Encounter for aftercare following other organ transplant: Secondary | ICD-10-CM | POA: Diagnosis not present

## 2021-08-10 DIAGNOSIS — Z944 Liver transplant status: Secondary | ICD-10-CM | POA: Diagnosis not present

## 2021-08-10 DIAGNOSIS — Z7960 Long term (current) use of unspecified immunomodulators and immunosuppressants: Secondary | ICD-10-CM | POA: Diagnosis not present

## 2021-09-05 ENCOUNTER — Other Ambulatory Visit: Payer: Self-pay | Admitting: "Endocrinology

## 2021-09-11 DIAGNOSIS — N183 Chronic kidney disease, stage 3 unspecified: Secondary | ICD-10-CM | POA: Diagnosis not present

## 2021-09-11 DIAGNOSIS — M1712 Unilateral primary osteoarthritis, left knee: Secondary | ICD-10-CM | POA: Diagnosis not present

## 2021-09-11 DIAGNOSIS — Z79890 Hormone replacement therapy (postmenopausal): Secondary | ICD-10-CM | POA: Diagnosis not present

## 2021-09-11 DIAGNOSIS — Z944 Liver transplant status: Secondary | ICD-10-CM | POA: Diagnosis not present

## 2021-09-11 DIAGNOSIS — E039 Hypothyroidism, unspecified: Secondary | ICD-10-CM | POA: Diagnosis not present

## 2021-10-23 DIAGNOSIS — Z944 Liver transplant status: Secondary | ICD-10-CM | POA: Diagnosis not present

## 2021-10-23 DIAGNOSIS — E119 Type 2 diabetes mellitus without complications: Secondary | ICD-10-CM | POA: Diagnosis not present

## 2021-10-23 DIAGNOSIS — I10 Essential (primary) hypertension: Secondary | ICD-10-CM | POA: Diagnosis not present

## 2021-10-23 DIAGNOSIS — Z794 Long term (current) use of insulin: Secondary | ICD-10-CM | POA: Diagnosis not present

## 2021-10-23 DIAGNOSIS — Z79899 Other long term (current) drug therapy: Secondary | ICD-10-CM | POA: Diagnosis not present

## 2021-10-23 DIAGNOSIS — Z48298 Encounter for aftercare following other organ transplant: Secondary | ICD-10-CM | POA: Diagnosis not present

## 2021-10-23 DIAGNOSIS — Z8673 Personal history of transient ischemic attack (TIA), and cerebral infarction without residual deficits: Secondary | ICD-10-CM | POA: Diagnosis not present

## 2021-10-23 DIAGNOSIS — C4490 Unspecified malignant neoplasm of skin, unspecified: Secondary | ICD-10-CM | POA: Diagnosis not present

## 2021-11-02 NOTE — Patient Instructions (Addendum)

## 2021-11-03 ENCOUNTER — Encounter: Payer: Self-pay | Admitting: "Endocrinology

## 2021-11-03 ENCOUNTER — Ambulatory Visit: Payer: PPO | Admitting: "Endocrinology

## 2021-11-03 VITALS — BP 156/82 | HR 78 | Ht 69.0 in | Wt 240.0 lb

## 2021-11-03 DIAGNOSIS — N182 Chronic kidney disease, stage 2 (mild): Secondary | ICD-10-CM

## 2021-11-03 DIAGNOSIS — E782 Mixed hyperlipidemia: Secondary | ICD-10-CM

## 2021-11-03 DIAGNOSIS — Z794 Long term (current) use of insulin: Secondary | ICD-10-CM

## 2021-11-03 DIAGNOSIS — I10 Essential (primary) hypertension: Secondary | ICD-10-CM

## 2021-11-03 DIAGNOSIS — E559 Vitamin D deficiency, unspecified: Secondary | ICD-10-CM

## 2021-11-03 DIAGNOSIS — E1122 Type 2 diabetes mellitus with diabetic chronic kidney disease: Secondary | ICD-10-CM | POA: Diagnosis not present

## 2021-11-03 DIAGNOSIS — E039 Hypothyroidism, unspecified: Secondary | ICD-10-CM | POA: Diagnosis not present

## 2021-11-03 LAB — POCT GLYCOSYLATED HEMOGLOBIN (HGB A1C): HbA1c, POC (controlled diabetic range): 6.6 % (ref 0.0–7.0)

## 2021-11-03 MED ORDER — NOVOLOG FLEXPEN 100 UNIT/ML ~~LOC~~ SOPN: 6.0000 [IU] | PEN_INJECTOR | Freq: Three times a day (TID) | SUBCUTANEOUS | 3 refills | Status: DC

## 2021-11-03 MED ORDER — INSULIN GLARGINE-YFGN 100 UNIT/ML ~~LOC~~ SOPN: 16.0000 [IU] | PEN_INJECTOR | Freq: Every day | SUBCUTANEOUS | 1 refills | Status: DC

## 2021-11-03 NOTE — Progress Notes (Signed)
?11/03/2021 ?             ?Endocrinology follow-up note ? ? ?Subjective:  ? ? Patient ID: [name], male    DOB: 08-20-1940, PCP Sharilyn Sites, MD ? ? ?Past Medical History:  ?Diagnosis Date  ? Anemia   ? Biliary calculi, common bile duct   ? CRI (chronic renal insufficiency)   ? DM (diabetes mellitus) ([place])   ? Type II  ? Elevated liver enzymes   ? Esophageal varices ([place]) 03/19/11  ? mrcp  ? History of ERCP 03/21/2011  ? Extrahepatic biliary obstruction secondary to occluded biliary stent and colon no cholelithiasis, status post sphincterotomy, stent removal and replacement and stone extraction.  ? HTN (hypertension)   ? Hypothyroidism   ? NASH (nonalcoholic steatohepatitis) 2006  ? liver transplant  ? Pancreatitis chronic 03/19/11  ? mrcp  ? S/P colonoscopy with polypectomy 2004  ? Dr Gala Romney  ? S/P endoscopy 03/2004  ? Hx esophgaeal varices, pyloric channel ulcer prior to transplant  ? Squamous cell cancer of external ear   ? pinna  ? Stroke [facility])   ? "mini-stroke, weakness of left leg".  ? ?Past Surgical History:  ?Procedure Laterality Date  ? BILE DUCT STENT PLACEMENT  03/21/11  ? Dr. Gala Romney  ? BIOPSY  02/12/2020  ? Procedure: BIOPSY;  Surgeon: Montez Morita, Quillian Quince, MD;  Location: [facility];  Service: Gastroenterology;;  ? CATARACT EXTRACTION Bilateral   ? CHOLECYSTECTOMY  2006  ? COLONOSCOPY  08/12/2002  ? Dr. Gala Romney- somewhat diffusely friable rectum and colon, polyp at 30 cm.  ? COLONOSCOPY N/A 09/08/2012  ? Procedure: COLONOSCOPY;  Surgeon: Daneil Dolin, MD;  colonic diverticulosis. Repeat in 10 years.   ? ERCP  03/21/2011  ? Dr. Gala Romney- ERCP with removal of pediatric feeding tube, endoscopic sphinecterotomy with balloon dilation of ampulla followed by stone extraction and stent placement  ? ESOPHAGOGASTRODUODENOSCOPY  03/20/2004  ? Dr. Nash Mantis 2 esophageal varices o/w normal esophageal mucosa, mild changes of snake skin in the gastric mucosa consistent with portal gastropathy, multiple antral erosions   and  a single pyloric channel ulcer benign appearance o/w normal gastric mucosa, normal D1 and S2  ? ESOPHAGOGASTRODUODENOSCOPY (EGD) WITH PROPOFOL N/A 02/12/2020  ? Procedure: ESOPHAGOGASTRODUODENOSCOPY (EGD) WITH PROPOFOL;  Surgeon: Montez Morita, Quillian Quince, MD; Food impaction in the middle third of the esophagus s/p removal, three superficial esophageal ulcerations with no stigmata of recent bleeding, one cratered nonnecrotic esophageal ulcer with no stigmata of recent bleed in the mid esophagus s/p biopsied. Path consistet with ulcer.   ? ESOPHAGOGASTRODUODENOSCOPY (EGD) WITH PROPOFOL N/A 08/22/2020  ? Procedure: ESOPHAGOGASTRODUODENOSCOPY (EGD) WITH PROPOFOL;  Surgeon: Daneil Dolin, MD;  [facility];  Service: Endoscopy;  Laterality: N/A;  7:30am  ? KNEE ARTHROSCOPY WITH EXCISION PLICA Left 7/85/8850  ? Procedure: KNEE ARTHROSCOPY WITH EXCISION PLICA;  Surgeon: Carole Civil, MD;  Location: [facility];  Service: Orthopedics;  Laterality: Left;  ? KNEE ARTHROSCOPY WITH MEDIAL MENISECTOMY Left 11/21/2012  ? Procedure: KNEE ARTHROSCOPY WITH MEDIAL AND LATERAL MENISECTOMY;  Surgeon: Carole Civil, MD;  Location: [facility];  Service: Orthopedics;  Laterality: Left;  ? LIVER TRANSPLANT  2006  ? [facility] cirrhosis  ? MALONEY DILATION N/A 08/22/2020  ? Procedure: MALONEY DILATION;  Surgeon: Daneil Dolin, MD;  Location: [facility];  Service: Endoscopy;  Laterality: N/A;  ? MOUTH SURGERY  may 2012  ? MRCP  03/19/11  ? biliary ductal dilatation- stones. esophageal  varices  ? SPHINCTEROTOMY  03/21/2011  ? Procedure: SPHINCTEROTOMY;  Surgeon: Daneil Dolin, MD;  Location: [facility];  Service: Gastroenterology;;  ? ?Social History  ? ?Socioeconomic History  ? Marital status: Married  ?  Spouse name: Not on file  ? Number of children: 1  ? Years of education: 31  ? Highest education level: Not on file  ?Occupational History  ? Occupation: RETIRED  ?  Employer: RETIRED  ?Tobacco Use  ? Smoking status: Never  ?  Smokeless tobacco: Never  ?Vaping Use  ? Vaping Use: Never used  ?Substance and Sexual Activity  ? Alcohol use: No  ? Drug use: No  ? Sexual activity: Yes  ?Other Topics Concern  ? Not on file  ?Social History Narrative  ? Not on file  ? ?Social Determinants of Health  ? ?Financial Resource Strain: Not on file  ?Food Insecurity: Not on file  ?Transportation Needs: Not on file  ?Physical Activity: Not on file  ?Stress: Not on file  ?Social Connections: Not on file  ? ?Outpatient Encounter Medications as of 11/03/2021  ?Medication Sig  ? aspirin 325 MG tablet Take 1 tablet (325 mg total) by mouth daily.  ? docusate sodium (COLACE) 100 MG capsule Take 100 mg by mouth daily.  ? famotidine (PEPCID) 20 MG tablet TAKE (1) TABLET BY MOUTH TWICE DAILY.  ? insulin aspart (NOVOLOG FLEXPEN) 100 UNIT/ML FlexPen Inject 6-9 Units into the skin 3 (three) times daily with meals. Give per sliding scale three times a day  ? insulin glargine-yfgn (SEMGLEE) 100 UNIT/ML Pen Inject 16 Units into the skin at bedtime.  ? levothyroxine (SYNTHROID) 137 MCG tablet Take 1 tablet (137 mcg total) by mouth daily before breakfast.  ? MAGNESIUM PO Take 133 mg by mouth 2 (two) times daily.  ? Multiple Vitamins-Minerals (MULTIVITAMIN WITH MINERALS) tablet Take 1 tablet by mouth daily.    ? olmesartan (BENICAR) 40 MG tablet Take 40 mg by mouth daily.   ? ONETOUCH ULTRA test strip TESTING FOUR TIMES DAILY.  ? predniSONE (DELTASONE) 20 MG tablet Take 20 mg by mouth daily as needed.  ? tacrolimus (PROGRAF) 0.5 MG capsule Take 0.5 mg by mouth 2 (two) times daily.   ? [DISCONTINUED] insulin aspart (NOVOLOG FLEXPEN) 100 UNIT/ML FlexPen Inject 8-11 Units into the skin 3 (three) times daily with meals. Give per sliding scale three times a day  ? [DISCONTINUED] insulin glargine-yfgn (SEMGLEE) 100 UNIT/ML Pen Inject 20 Units into the skin at bedtime.  ? ?No facility-administered encounter medications on file as of 11/03/2021.  ? ?ALLERGIES: ?Allergies  ?Allergen  Reactions  ? Contrast Media [Iodinated Contrast Media] Rash  ? ?VACCINATION STATUS: ?Immunization History  ?Administered Date(s) Administered  ? Influenza Split 02/13/2011, 04/29/2012, 04/20/2013, 05/09/2013  ? Influenza, High Dose Seasonal PF 04/26/2015, 04/13/2017  ? Influenza-Unspecified 04/08/2013  ? Moderna Sars-Covid-2 Vaccination 07/30/2019, 09/04/2019  ? Pneumococcal Conjugate-13 07/29/2014  ? Pneumococcal Polysaccharide-23 03/17/2010, 04/16/2011  ? ? ?Diabetes ?He presents for his follow-up diabetic visit. He has type 2 diabetes mellitus. Onset time: He was diagnosed at approximate age of 39 years. His disease course has been improving. Pertinent negatives for hypoglycemia include no confusion, headaches, nervousness/anxiousness, pallor, seizures, sweats or tremors. There are no diabetic associated symptoms. Pertinent negatives for diabetes include no chest pain, no fatigue, no polydipsia, no polyphagia, no polyuria and no weakness. Hypoglycemia complications include nocturnal hypoglycemia. (He had 1 episode of nocturnal hypoglycemia in the last month.) Symptoms are improving. Diabetic complications include  a CVA and nephropathy. Risk factors for coronary artery disease include diabetes mellitus, dyslipidemia, hypertension, male sex, obesity, sedentary lifestyle and tobacco exposure. Current diabetic treatment includes intensive insulin program. He is compliant with treatment most of the time. His weight is decreasing steadily (He has a 20+ pounds of weight loss over the last 6 months, largely intentional.). He is following a generally healthy diet. Meal planning includes avoidance of concentrated sweets. He rarely participates in exercise. His home blood glucose trend is decreasing steadily. His breakfast blood glucose range is generally 90-110 mg/dl. His lunch blood glucose range is generally 90-110 mg/dl. His dinner blood glucose range is generally 130-140 mg/dl. His bedtime blood glucose range is  generally 130-140 mg/dl. His overall blood glucose range is 130-140 mg/dl. (He presents today with his logs, no meter, showing tight fasting and at goal postprandial glucose readings.  His point-of-care A1c

## 2021-11-07 DIAGNOSIS — Z48298 Encounter for aftercare following other organ transplant: Secondary | ICD-10-CM | POA: Diagnosis not present

## 2021-11-07 DIAGNOSIS — Z944 Liver transplant status: Secondary | ICD-10-CM | POA: Diagnosis not present

## 2021-11-07 DIAGNOSIS — Z7960 Long term (current) use of unspecified immunomodulators and immunosuppressants: Secondary | ICD-10-CM | POA: Diagnosis not present

## 2021-12-08 DIAGNOSIS — D692 Other nonthrombocytopenic purpura: Secondary | ICD-10-CM | POA: Diagnosis not present

## 2021-12-08 DIAGNOSIS — I69344 Monoplegia of lower limb following cerebral infarction affecting left non-dominant side: Secondary | ICD-10-CM | POA: Diagnosis not present

## 2021-12-08 DIAGNOSIS — N183 Chronic kidney disease, stage 3 unspecified: Secondary | ICD-10-CM | POA: Diagnosis not present

## 2021-12-08 DIAGNOSIS — E039 Hypothyroidism, unspecified: Secondary | ICD-10-CM | POA: Diagnosis not present

## 2021-12-08 DIAGNOSIS — Z6834 Body mass index (BMI) 34.0-34.9, adult: Secondary | ICD-10-CM | POA: Diagnosis not present

## 2021-12-08 DIAGNOSIS — Z79890 Hormone replacement therapy (postmenopausal): Secondary | ICD-10-CM | POA: Diagnosis not present

## 2021-12-08 DIAGNOSIS — E1122 Type 2 diabetes mellitus with diabetic chronic kidney disease: Secondary | ICD-10-CM | POA: Diagnosis not present

## 2022-02-06 DIAGNOSIS — Z7960 Long term (current) use of unspecified immunomodulators and immunosuppressants: Secondary | ICD-10-CM | POA: Diagnosis not present

## 2022-02-06 DIAGNOSIS — Z48298 Encounter for aftercare following other organ transplant: Secondary | ICD-10-CM | POA: Diagnosis not present

## 2022-02-06 DIAGNOSIS — Z944 Liver transplant status: Secondary | ICD-10-CM | POA: Diagnosis not present

## 2022-02-06 DIAGNOSIS — E1122 Type 2 diabetes mellitus with diabetic chronic kidney disease: Secondary | ICD-10-CM | POA: Diagnosis not present

## 2022-02-06 DIAGNOSIS — Z794 Long term (current) use of insulin: Secondary | ICD-10-CM | POA: Diagnosis not present

## 2022-02-06 DIAGNOSIS — N182 Chronic kidney disease, stage 2 (mild): Secondary | ICD-10-CM | POA: Diagnosis not present

## 2022-02-07 LAB — COMPREHENSIVE METABOLIC PANEL
ALT: 12 IU/L (ref 0–44)
AST: 18 IU/L (ref 0–40)
Albumin/Globulin Ratio: 1.3 (ref 1.2–2.2)
Albumin: 4 g/dL (ref 3.8–4.8)
Alkaline Phosphatase: 77 IU/L (ref 44–121)
BUN/Creatinine Ratio: 17 (ref 10–24)
BUN: 22 mg/dL (ref 8–27)
Bilirubin Total: 1 mg/dL (ref 0.0–1.2)
CO2: 25 mmol/L (ref 20–29)
Calcium: 9.4 mg/dL (ref 8.6–10.2)
Chloride: 104 mmol/L (ref 96–106)
Creatinine, Ser: 1.3 mg/dL — ABNORMAL HIGH (ref 0.76–1.27)
Globulin, Total: 3 g/dL (ref 1.5–4.5)
Glucose: 114 mg/dL — ABNORMAL HIGH (ref 70–99)
Potassium: 4.6 mmol/L (ref 3.5–5.2)
Sodium: 140 mmol/L (ref 134–144)
Total Protein: 7 g/dL (ref 6.0–8.5)
eGFR: 56 mL/min/{1.73_m2} — ABNORMAL LOW (ref >59)

## 2022-02-07 LAB — LIPID PANEL
Chol/HDL Ratio: 3.3 ratio (ref 0.0–5.0)
Cholesterol, Total: 113 mg/dL (ref 100–199)
HDL: 34 mg/dL — ABNORMAL LOW (ref >39)
LDL Chol Calc (NIH): 65 mg/dL (ref 0–99)
Triglycerides: 67 mg/dL (ref 0–149)
VLDL Cholesterol Cal: 14 mg/dL (ref 5–40)

## 2022-02-07 LAB — TSH: TSH: 0.293 u[IU]/mL — ABNORMAL LOW (ref 0.450–4.500)

## 2022-02-07 LAB — T4, FREE: Free T4: 1.64 ng/dL (ref 0.82–1.77)

## 2022-02-08 ENCOUNTER — Encounter: Payer: Self-pay | Admitting: *Deleted

## 2022-02-08 ENCOUNTER — Other Ambulatory Visit: Payer: Self-pay | Admitting: *Deleted

## 2022-02-08 NOTE — Patient Outreach (Signed)
  Care Coordination   Initial Visit Note   02/08/2022 Name: [name] MRN: [id number] DOB: 1940-08-17  [name] is a 81 y.o. year old male who sees Sharilyn Sites, MD for primary care. I spoke with  [name] by phone today  What matters to the patients health and wellness today?  Ability to have assistance with grocery shopping and/or meal preparation. Discussed the option of mobile meals vs moms meals.  He is not home bound and would not qualify for mobile meals, denies the ability to pay additional fees for mom's meals.  State he has daughter and son in law that are in the area that are able to help if/when needed.  Also has home visits from [facility] every quarter.  Reviewed DM management with member and upcoming appointments (next week at [facility] and 8/28 at endocrinology).  Has completed both foot exam and eye exam in the last year.      SDOH assessments and interventions completed:  Yes     Care Coordination Interventions Activated:  Yes  Care Coordination Interventions:  Yes, provided   Follow up plan: No further intervention required.   Encounter Outcome:  Pt. Visit Completed   Valente David, RN, MSN, [facility] Care Coordinator [phone]

## 2022-03-05 ENCOUNTER — Ambulatory Visit: Payer: PPO | Admitting: Nurse Practitioner

## 2022-03-05 ENCOUNTER — Encounter: Payer: Self-pay | Admitting: Nurse Practitioner

## 2022-03-05 VITALS — BP 142/78 | HR 76 | Ht 69.0 in | Wt 230.0 lb

## 2022-03-05 DIAGNOSIS — E559 Vitamin D deficiency, unspecified: Secondary | ICD-10-CM

## 2022-03-05 DIAGNOSIS — Z794 Long term (current) use of insulin: Secondary | ICD-10-CM

## 2022-03-05 DIAGNOSIS — E782 Mixed hyperlipidemia: Secondary | ICD-10-CM | POA: Diagnosis not present

## 2022-03-05 DIAGNOSIS — E1122 Type 2 diabetes mellitus with diabetic chronic kidney disease: Secondary | ICD-10-CM

## 2022-03-05 DIAGNOSIS — I10 Essential (primary) hypertension: Secondary | ICD-10-CM | POA: Diagnosis not present

## 2022-03-05 DIAGNOSIS — N1831 Chronic kidney disease, stage 3a: Secondary | ICD-10-CM

## 2022-03-05 DIAGNOSIS — E039 Hypothyroidism, unspecified: Secondary | ICD-10-CM

## 2022-03-05 LAB — POCT GLYCOSYLATED HEMOGLOBIN (HGB A1C): Hemoglobin A1C: 6.4 % — AB (ref 4.0–5.6)

## 2022-03-05 NOTE — Progress Notes (Signed)
03/05/2022              Endocrinology follow-up note   Subjective:    Patient ID: [name], male    DOB: 06/11/1941, PCP Sharilyn Sites, MD   Past Medical History:  Diagnosis Date   Anemia    Biliary calculi, common bile duct    CRI (chronic renal insufficiency)    DM (diabetes mellitus) (HCC)    Type II   Elevated liver enzymes    Esophageal varices ([place]) 03/19/11   mrcp   History of ERCP 03/21/2011   Extrahepatic biliary obstruction secondary to occluded biliary stent and colon no cholelithiasis, status post sphincterotomy, stent removal and replacement and stone extraction.   HTN (hypertension)    Hypothyroidism    NASH (nonalcoholic steatohepatitis) 2006   liver transplant   Pancreatitis chronic 03/19/11   mrcp   S/P colonoscopy with polypectomy 2004   Dr Gala Romney   S/P endoscopy 03/2004   Hx esophgaeal varices, pyloric channel ulcer prior to transplant   Squamous cell cancer of external ear    pinna   Stroke ([place])    "mini-stroke, weakness of left leg".   Past Surgical History:  Procedure Laterality Date   BILE DUCT STENT PLACEMENT  03/21/11   Dr. Gala Romney   BIOPSY  02/12/2020   Procedure: BIOPSY;  Surgeon: Harvel Quale, MD;  Location: [facility];  Service: Gastroenterology;;   CATARACT EXTRACTION Bilateral    CHOLECYSTECTOMY  2006   COLONOSCOPY  08/12/2002   Dr. Gala Romney- somewhat diffusely friable rectum and colon, polyp at 30 cm.   COLONOSCOPY N/A 09/08/2012   Procedure: COLONOSCOPY;  Surgeon: Daneil Dolin, MD;  colonic diverticulosis. Repeat in 10 years.    ERCP  03/21/2011   Dr. Gala Romney- ERCP with removal of pediatric feeding tube, endoscopic sphinecterotomy with balloon dilation of ampulla followed by stone extraction and stent placement   ESOPHAGOGASTRODUODENOSCOPY  03/20/2004   Dr. Nash Mantis 2 esophageal varices o/w normal esophageal mucosa, mild changes of snake skin in the gastric mucosa consistent with portal gastropathy, multiple antral erosions   and  a single pyloric channel ulcer benign appearance o/w normal gastric mucosa, normal D1 and S2   ESOPHAGOGASTRODUODENOSCOPY (EGD) WITH PROPOFOL N/A 02/12/2020   Procedure: ESOPHAGOGASTRODUODENOSCOPY (EGD) WITH PROPOFOL;  Surgeon: Montez Morita, Quillian Quince, MD; Food impaction in the middle third of the esophagus s/p removal, three superficial esophageal ulcerations with no stigmata of recent bleeding, one cratered nonnecrotic esophageal ulcer with no stigmata of recent bleed in the mid esophagus s/p biopsied. Path consistet with ulcer.    ESOPHAGOGASTRODUODENOSCOPY (EGD) WITH PROPOFOL N/A 08/22/2020   Procedure: ESOPHAGOGASTRODUODENOSCOPY (EGD) WITH PROPOFOL;  Surgeon: Daneil Dolin, MD;  [facility]: [facility];  Service: Endoscopy;  Laterality: N/A;  7:30am   KNEE ARTHROSCOPY WITH EXCISION PLICA Left 1/51/7616   Procedure: KNEE ARTHROSCOPY WITH EXCISION PLICA;  Surgeon: Carole Civil, MD;  Location: [facility];  Service: Orthopedics;  Laterality: Left;   KNEE ARTHROSCOPY WITH MEDIAL MENISECTOMY Left 11/21/2012   Procedure: KNEE ARTHROSCOPY WITH MEDIAL AND LATERAL MENISECTOMY;  Surgeon: Carole Civil, MD;  Location: [facility];  Service: Orthopedics;  Laterality: Left;   LIVER TRANSPLANT  2006   UVA-NASH cirrhosis   MALONEY DILATION N/A 08/22/2020   Procedure: Venia Minks DILATION;  Surgeon: Daneil Dolin, MD;  Location: [facility];  Service: Endoscopy;  Laterality: N/A;   MOUTH SURGERY  may 2012   MRCP  03/19/11   biliary ductal dilatation- stones. esophageal  varices   SPHINCTEROTOMY  03/21/2011   Procedure: SPHINCTEROTOMY;  Surgeon: Daneil Dolin, MD;  Location: [facility];  Service: Gastroenterology;;   Social History   Socioeconomic History   Marital status: Married    Spouse name: Not on file   Number of children: 1   Years of education: 14   Highest education level: Not on file  Occupational History   Occupation: RETIRED    Employer: RETIRED  Tobacco Use   Smoking status: Never    Smokeless tobacco: Never  Vaping Use   Vaping Use: Never used  Substance and Sexual Activity   Alcohol use: No   Drug use: No   Sexual activity: Yes  Other Topics Concern   Not on file  Social History Narrative   Not on file   Social Determinants of Health   Financial Resource Strain: Not on file  Food Insecurity: No Food Insecurity (02/08/2022)   Hunger Vital Sign    Worried About Running Out of Food in the Last Year: Never true    Ran Out of Food in the Last Year: Never true  Transportation Needs: No Transportation Needs (02/08/2022)   PRAPARE - Hydrologist (Medical): No    Lack of Transportation (Non-Medical): No  Physical Activity: Not on file  Stress: Not on file  Social Connections: Not on file   Outpatient Encounter Medications as of 03/05/2022  Medication Sig   aspirin 325 MG tablet Take 1 tablet (325 mg total) by mouth daily.   docusate sodium (COLACE) 100 MG capsule Take 100 mg by mouth daily.   famotidine (PEPCID) 20 MG tablet TAKE (1) TABLET BY MOUTH TWICE DAILY.   insulin aspart (NOVOLOG FLEXPEN) 100 UNIT/ML FlexPen Inject 6-9 Units into the skin 3 (three) times daily with meals. Give per sliding scale three times a day   insulin glargine-yfgn (SEMGLEE) 100 UNIT/ML Pen Inject 16 Units into the skin at bedtime.   levothyroxine (SYNTHROID) 137 MCG tablet Take 1 tablet (137 mcg total) by mouth daily before breakfast.   MAGNESIUM PO Take 133 mg by mouth 2 (two) times daily.   Multiple Vitamins-Minerals (MULTIVITAMIN WITH MINERALS) tablet Take 1 tablet by mouth daily.     olmesartan (BENICAR) 40 MG tablet Take 40 mg by mouth daily.    ONETOUCH ULTRA test strip TESTING FOUR TIMES DAILY.   predniSONE (DELTASONE) 20 MG tablet Take 20 mg by mouth daily as needed.   tacrolimus (PROGRAF) 0.5 MG capsule Take 0.5 mg by mouth 2 (two) times daily.    No facility-administered encounter medications on file as of 03/05/2022.   ALLERGIES: Allergies   Allergen Reactions   Contrast Media [Iodinated Contrast Media] Rash   VACCINATION STATUS: Immunization History  Administered Date(s) Administered   Influenza Split 02/13/2011, 04/29/2012, 04/20/2013, 05/09/2013   Influenza, High Dose Seasonal PF 04/26/2015, 04/13/2017   Influenza-Unspecified 04/08/2013   Moderna Sars-Covid-2 Vaccination 07/30/2019, 09/04/2019   Pneumococcal Conjugate-13 07/29/2014   Pneumococcal Polysaccharide-23 03/17/2010, 04/16/2011    Diabetes He presents for his follow-up diabetic visit. He has type 2 diabetes mellitus. Onset time: He was diagnosed at approximate age of 35 years. His disease course has been improving. Pertinent negatives for hypoglycemia include no confusion, headaches, nervousness/anxiousness, pallor, seizures, sweats or tremors. There are no diabetic associated symptoms. Pertinent negatives for diabetes include no chest pain, no fatigue, no polydipsia, no polyphagia, no polyuria and no weakness. There are no hypoglycemic complications. (He had 1 episode of nocturnal hypoglycemia in  the last month.) Symptoms are improving. Diabetic complications include a CVA and nephropathy. Risk factors for coronary artery disease include diabetes mellitus, dyslipidemia, hypertension, male sex, obesity, sedentary lifestyle and tobacco exposure. Current diabetic treatment includes intensive insulin program. He is compliant with treatment most of the time. His weight is decreasing steadily (He has a 20+ pounds of weight loss over the last 6 months, largely intentional.). He is following a generally healthy diet. Meal planning includes avoidance of concentrated sweets. He rarely participates in exercise. His home blood glucose trend is fluctuating minimally. His breakfast blood glucose range is generally 110-130 mg/dl. His lunch blood glucose range is generally 110-130 mg/dl. His dinner blood glucose range is generally 130-140 mg/dl. His bedtime blood glucose range is generally  130-140 mg/dl. His overall blood glucose range is 130-140 mg/dl. (He presents today with his logs, no meter, showing stable at goal glycemic profile overall.  His POCT A1c today is 6.4%, improving from last visit of 6.6%.  He denies any hypoglycemia.  He has lost approximately 10 lbs since last visit, a good development for him. ) An ACE inhibitor/angiotensin II receptor blocker is being taken. Eye exam is current.  Hyperlipidemia This is a chronic problem. The current episode started more than 1 year ago. The problem is controlled. Recent lipid tests were reviewed and are normal. Exacerbating diseases include diabetes, hypothyroidism and obesity. Pertinent negatives include no chest pain, myalgias or shortness of breath. He is currently on no antihyperlipidemic treatment. Compliance problems include adherence to exercise.  Risk factors for coronary artery disease include dyslipidemia, diabetes mellitus, hypertension, male sex, obesity and a sedentary lifestyle.  Hypertension This is a chronic problem. The current episode started more than 1 year ago. The problem has been resolved since onset. The problem is controlled. Pertinent negatives include no chest pain, headaches, neck pain, palpitations, shortness of breath or sweats. There are no associated agents to hypertension. Risk factors for coronary artery disease include dyslipidemia, family history, obesity, male gender and sedentary lifestyle. Past treatments include ACE inhibitors. The current treatment provides mild improvement. There are no compliance problems.  Hypertensive end-organ damage includes CVA. He is status post liver transplant.. Identifiable causes of hypertension include a thyroid problem.  Thyroid Problem Presents for follow-up visit. Patient reports no anxiety, constipation, diarrhea, fatigue, palpitations or tremors. The symptoms have been stable. Past treatments include levothyroxine. The following procedures have not been performed:  thyroidectomy. His past medical history is significant for diabetes and hyperlipidemia.     Review of systems  Constitutional: + steadily decreasing body weight,  current Body mass index is 33.97 kg/m. , no fatigue, no subjective hyperthermia, no subjective hypothermia Eyes: no blurry vision, no xerophthalmia ENT: no sore throat, no nodules palpated in throat, no dysphagia/odynophagia, no hoarseness Cardiovascular: no chest pain, no shortness of breath, no palpitations, no leg swelling Respiratory: no cough, no shortness of breath Gastrointestinal: no nausea/vomiting/diarrhea Musculoskeletal: no muscle/joint aches Skin: no rashes, no hyperemia Neurological: no tremors, no numbness, no tingling, no dizziness Psychiatric: no depression, no anxiety   Objective:    BP (!) 142/78   Pulse 76   Ht '5\' 9"'$  (1.753 m)   Wt 230 lb (104.3 kg)   BMI 33.97 kg/m   Wt Readings from Last 3 Encounters:  03/05/22 230 lb (104.3 kg)  11/03/21 240 lb (108.9 kg)  07/05/21 251 lb (113.9 kg)     BP Readings from Last 3 Encounters:  03/05/22 (!) 142/78  11/03/21 (!) 156/82  07/05/21 Marland Kitchen)  150/84     Physical Exam- Limited  Constitutional:  Body mass index is 33.97 kg/m. , not in acute distress, normal state of mind Eyes:  EOMI, no exophthalmos Neck: Supple Cardiovascular: RRR, no murmurs, rubs, or gallops, no edema Respiratory: Adequate breathing efforts, no crackles, rales, rhonchi, or wheezing Musculoskeletal: no gross deformities, strength intact in all four extremities, no gross restriction of joint movements Skin:  no rashes, no hyperemia Neurological: no tremor with outstretched hands   Diabetic Foot Exam - Simple   Simple Foot Form Diabetic Foot exam was performed with the following findings: Yes 03/05/2022  9:45 AM  Visual Inspection See comments: Yes Sensation Testing Intact to touch and monofilament testing bilaterally: Yes Pulse Check Posterior Tibialis and Dorsalis pulse  intact bilaterally: Yes Comments Onychomycosis bilaterally     Results for orders placed or performed in visit on 03/05/22  HgB A1c  Result Value Ref Range   Hemoglobin A1C 6.4 (A) 4.0 - 5.6 %   HbA1c POC (<> result, manual entry)     HbA1c, POC (prediabetic range)     HbA1c, POC (controlled diabetic range)     Diabetic Labs (most recent): Lab Results  Component Value Date   HGBA1C 6.4 (A) 03/05/2022   HGBA1C 6.6 11/03/2021   HGBA1C 6.4 07/05/2021   Lipid Panel     Component Value Date/Time   CHOL 113 02/06/2022 0806   TRIG 67 02/06/2022 0806   HDL 34 (L) 02/06/2022 0806   CHOLHDL 3.3 02/06/2022 0806   CHOLHDL 4.3 04/13/2017 0547   VLDL 21 04/13/2017 0547   LDLCALC 65 02/06/2022 0806     Assessment & Plan:   1) Controlled type 2 diabetes mellitus with complication, with CKD GFR of 49, CVA  -Recently he had developed CVA requiring rehabilitation and physical therapy. He remains at a high risk for more acute and chronic complications of diabetes which include CAD, CVA, CKD, retinopathy, and neuropathy. These are all discussed in detail with the patient.  He presents today with his logs, no meter, showing stable at goal glycemic profile overall.  His POCT A1c today is 6.4%, improving from last visit of 6.6%.  He denies any hypoglycemia.  He has lost approximately 10 lbs since last visit, a good development for him.   Glucose logs and insulin administration records pertaining to this visit,  to be scanned into patient's records.  Recent labs reviewed.  (He refused to have his UM checked today).  - Nutritional counseling repeated at each appointment due to patients tendency to fall back in to old habits.  - The patient admits there is a room for improvement in their diet and drink choices. -  Suggestion is made for the patient to avoid simple carbohydrates from their diet including Cakes, Sweet Desserts / Pastries, Ice Cream, Soda (diet and regular), Sweet Tea, Candies,  Chips, Cookies, Sweet Pastries, Store Bought Juices, Alcohol in Excess of 1-2 drinks a day, Artificial Sweeteners, Coffee Creamer, and "Sugar-free" Products. This will help patient to have stable blood glucose profile and potentially avoid unintended weight gain.   - I encouraged the patient to switch to unprocessed or minimally processed complex starch and increased protein intake (animal or plant source), fruits, and vegetables.   - Patient is advised to stick to a routine mealtimes to eat 3 meals a day and avoid unnecessary snacks (to snack only to correct hypoglycemia).  - I have approached patient with the following individualized plan to manage diabetes and patient agrees.  -  He will continue to require intensive treatment with basal/bolus insulin in order for him to maintain control of diabetes to target.   However, avoiding hypoglycemia is the number 1 priority in his care.  An acceptable A1c for him would be around 7.5%.  -Given his stable glycemic profile and lack of hypoglycemia, no changes will be made to his medications today.  He can continue his Lantus/Semglee 16 units nightly and Novolog 6-9 units TID with meals if glucose is above 90 and he is eating (Specific instructions on how to titrate insulin dosage based on glucose readings given to patient in writing).   - He would have benefited  from continuous glucose monitoring, however he decided not to get it because he cannot afford the monthly co-pay.    -He is encouraged to call this clinic for hypoglycemia below 70 or hyperglycemia above 200. - Adjustments for hypo-and hyperglycemia is given in written instructions to the patient.  -He is not a candidate for metformin, SGLT2 inhibitors, nor incretin in therapy due to history of liver transplant.  2) BP/HTN: -His blood pressure is near target.  He is advised to continue his current blood pressure medications including Lisinopril 20 mg p.o. daily.     3) Lipids/HPL:  His most  recent lipid panel from 02/06/22 shows controlled with LDL at 65.  He is not on statins as a result of his history of liver failure status post liver transplant.    4)  Weight/Diet:  His Body mass index is 33.97 kg/m.  He has lost 20+ pounds over the last 6 months intentionally, he is a candidate for continued weight loss.  Carbs/calories restrictions and optimal exercise program discussed with him.  5) Primary hypothyroidism: -His previsit thyroid function tests are consistent with appropriate hormone replacement.  He is advised to continue levothyroxine 137 mcg p.o. daily before breakfast.     - We discussed about the correct intake of his thyroid hormone, on empty stomach at fasting, with water, separated by at least 30 minutes from breakfast and other medications,  and separated by more than 4 hours from calcium, iron, multivitamins, acid reflux medications (PPIs). -Patient is made aware of the fact that thyroid hormone replacement is needed for life, dose to be adjusted by periodic monitoring of thyroid function tests.  5) Chronic Care/Health Maintenance: -Patient is on ACEI/ARB  medications and encouraged to continue to follow up with Ophthalmology, Podiatrist at least yearly or according to recommendations, and advised to  stay away from smoking. I have recommended yearly flu vaccine and pneumonia vaccination at least every 5 years; and  sleep for at least 7 hours a day. -His foot exam is normal today.  - I advised patient to maintain close follow up with Sharilyn Sites, MD for primary care needs.     I spent 42 minutes in the care of the patient today including review of labs from [facility], Lipids, Thyroid Function, Hematology (current and previous including abstractions from other facilities); face-to-face time discussing  his blood glucose readings/logs, discussing hypoglycemia and hyperglycemia episodes and symptoms, medications doses, his options of short and long term treatment based on the  latest standards of care / guidelines;  discussion about incorporating lifestyle medicine;  and documenting the encounter. Risk reduction counseling performed per USPSTF guidelines to reduce obesity and cardiovascular risk factors.     Please refer to Patient Instructions for Blood Glucose Monitoring and Insulin/Medications Dosing Guide"  in media tab for additional information. Please  also refer  to " Patient Self Inventory" in the Media  tab for reviewed elements of pertinent patient history.  Danae Orleans participated in the discussions, expressed understanding, and voiced agreement with the above plans.  All questions were answered to his satisfaction. he is encouraged to contact clinic should he have any questions or concerns prior to his return visit.    follow up plan: -Return in about 4 months (around 07/05/2022) for Diabetes F/U with A1c in office, Previsit labs, Thyroid follow up, Bring meter and logs.    Rayetta Pigg, [facility] [address] [place], [place] [address] Phone: [phone] Fax: [phone]  03/05/2022, 9:55 AM

## 2022-03-28 ENCOUNTER — Encounter: Payer: Self-pay | Admitting: *Deleted

## 2022-04-20 DIAGNOSIS — Z23 Encounter for immunization: Secondary | ICD-10-CM | POA: Diagnosis not present

## 2022-05-01 ENCOUNTER — Ambulatory Visit: Payer: PPO | Admitting: Internal Medicine

## 2022-05-10 DIAGNOSIS — Z944 Liver transplant status: Secondary | ICD-10-CM | POA: Diagnosis not present

## 2022-05-10 DIAGNOSIS — Z48298 Encounter for aftercare following other organ transplant: Secondary | ICD-10-CM | POA: Diagnosis not present

## 2022-05-10 DIAGNOSIS — E039 Hypothyroidism, unspecified: Secondary | ICD-10-CM | POA: Diagnosis not present

## 2022-05-10 DIAGNOSIS — Z79899 Other long term (current) drug therapy: Secondary | ICD-10-CM | POA: Diagnosis not present

## 2022-05-11 LAB — T4, FREE: Free T4: 1.77 ng/dL (ref 0.82–1.77)

## 2022-05-11 LAB — TSH: TSH: 0.251 u[IU]/mL — ABNORMAL LOW (ref 0.450–4.500)

## 2022-05-15 ENCOUNTER — Ambulatory Visit (INDEPENDENT_AMBULATORY_CARE_PROVIDER_SITE_OTHER): Payer: PPO | Admitting: Internal Medicine

## 2022-05-15 ENCOUNTER — Encounter: Payer: Self-pay | Admitting: Internal Medicine

## 2022-05-15 VITALS — BP 182/92 | HR 68 | Temp 98.0°F | Ht 69.5 in | Wt 224.8 lb

## 2022-05-15 DIAGNOSIS — R1319 Other dysphagia: Secondary | ICD-10-CM

## 2022-05-15 DIAGNOSIS — K219 Gastro-esophageal reflux disease without esophagitis: Secondary | ICD-10-CM

## 2022-05-15 NOTE — Progress Notes (Signed)
Primary Care Physician:  Sharilyn Sites, MD Primary Gastroenterologist:  Dr. Gala Romney  Pre-Procedure History & Physical: HPI:  [name] is a 81 y.o. male here for follow-up of GERD/reflux esophagitis.Esophageal food impaction requiring removal and subsequent esophageal dilation a year ago.  Intolerant to PPIs (Diarrhea).  Doing well of famotidine 20 mg twice daily.  No dysphagia.  No GERD.  Status post orthotopic liver transplantation 17 years ago due to NASH/cirrhosis.  He has done very well.  Wife is in rehab.  He is cooking for himself and taking care of all the household duties.  He has lost 25 pounds in the past year.  He feels well.  He continues to golf.  Accompanying lab data from [place] ordered by [facility] 05/10/2022 white count 6.2 hemoglobin 13.7 platelets 215 serum creatinine 1.15 BUN 19 total bilirubin 0.8 alkaline phosphatase 78 AST 18 ALT 13 tacrolimus level normal at 2.7 his TSH is slightly depressed at 0.251-seeing his endocrinologist in the near future.  BP today elevated at 182/92.  Past Medical History:  Diagnosis Date   Anemia    Biliary calculi, common bile duct    CRI (chronic renal insufficiency)    DM (diabetes mellitus) (HCC)    Type II   Elevated liver enzymes    Esophageal varices ([place]) 03/19/11   mrcp   History of ERCP 03/21/2011   Extrahepatic biliary obstruction secondary to occluded biliary stent and colon no cholelithiasis, status post sphincterotomy, stent removal and replacement and stone extraction.   HTN (hypertension)    Hypothyroidism    NASH (nonalcoholic steatohepatitis) 2006   liver transplant   Pancreatitis chronic 03/19/11   mrcp   S/P colonoscopy with polypectomy 2004   Dr Gala Romney   S/P endoscopy 03/2004   Hx esophgaeal varices, pyloric channel ulcer prior to transplant   Squamous cell cancer of external ear    pinna   Stroke ([place])    "mini-stroke, weakness of left leg".    Past Surgical History:  Procedure Laterality Date   BILE DUCT  STENT PLACEMENT  03/21/11   Dr. Gala Romney   BIOPSY  02/12/2020   Procedure: BIOPSY;  Surgeon: Harvel Quale, MD;  [facility];  Service: Gastroenterology;;   CATARACT EXTRACTION Bilateral    CHOLECYSTECTOMY  2006   COLONOSCOPY  08/12/2002   Dr. Gala Romney- somewhat diffusely friable rectum and colon, polyp at 30 cm.   COLONOSCOPY N/A 09/08/2012   Procedure: COLONOSCOPY;  Surgeon: Daneil Dolin, MD;  colonic diverticulosis. Repeat in 10 years.    ERCP  03/21/2011   Dr. Gala Romney- ERCP with removal of pediatric feeding tube, endoscopic sphinecterotomy with balloon dilation of ampulla followed by stone extraction and stent placement   ESOPHAGOGASTRODUODENOSCOPY  03/20/2004   Dr. Nash Mantis 2 esophageal varices o/w normal esophageal mucosa, mild changes of snake skin in the gastric mucosa consistent with portal gastropathy, multiple antral erosions  and  a single pyloric channel ulcer benign appearance o/w normal gastric mucosa, normal D1 and S2   ESOPHAGOGASTRODUODENOSCOPY (EGD) WITH PROPOFOL N/A 02/12/2020   Procedure: ESOPHAGOGASTRODUODENOSCOPY (EGD) WITH PROPOFOL;  Surgeon: Montez Morita, Quillian Quince, MD; Food impaction in the middle third of the esophagus s/p removal, three superficial esophageal ulcerations with no stigmata of recent bleeding, one cratered nonnecrotic esophageal ulcer with no stigmata of recent bleed in the mid esophagus s/p biopsied. Path consistet with ulcer.    ESOPHAGOGASTRODUODENOSCOPY (EGD) WITH PROPOFOL N/A 08/22/2020   Procedure: ESOPHAGOGASTRODUODENOSCOPY (EGD) WITH PROPOFOL;  Surgeon: Manus Rudd  M, MD;  [facility];  Service: Endoscopy;  Laterality: N/A;  7:30am   KNEE ARTHROSCOPY WITH EXCISION PLICA Left 0/62/6948   Procedure: KNEE ARTHROSCOPY WITH EXCISION PLICA;  Surgeon: Carole Civil, MD;  Location: [facility];  Service: Orthopedics;  Laterality: Left;   KNEE ARTHROSCOPY WITH MEDIAL MENISECTOMY Left 11/21/2012   Procedure: KNEE ARTHROSCOPY WITH  MEDIAL AND LATERAL MENISECTOMY;  Surgeon: Carole Civil, MD;  Location: [facility];  Service: Orthopedics;  Laterality: Left;   LIVER TRANSPLANT  2006   [facility] cirrhosis   MALONEY DILATION N/A 08/22/2020   Procedure: Venia Minks DILATION;  Surgeon: Daneil Dolin, MD;  [facility]: [facility];  Service: Endoscopy;  Laterality: N/A;   MOUTH SURGERY  may 2012   MRCP  03/19/11   biliary ductal dilatation- stones. esophageal varices   SPHINCTEROTOMY  03/21/2011   Procedure: SPHINCTEROTOMY;  Surgeon: Daneil Dolin, MD;  Location: [facility];  Service: Gastroenterology;;    Prior to Admission medications   Medication Sig Start Date End Date Taking? Authorizing Provider  aspirin 325 MG tablet Take 1 tablet (325 mg total) by mouth daily. 04/16/17  Yes Oswald Hillock, MD  famotidine (PEPCID) 20 MG tablet TAKE (1) TABLET BY MOUTH TWICE DAILY. Patient taking differently: Take 20 mg by mouth daily. 05/03/21  Yes Mahala Menghini, PA-C  insulin aspart (NOVOLOG FLEXPEN) 100 UNIT/ML FlexPen Inject 6-9 Units into the skin 3 (three) times daily with meals. Give per sliding scale three times a day Patient taking differently: Inject 6-11 Units into the skin 3 (three) times daily with meals. Give per sliding scale three times a day 11/03/21  Yes Nida, Marella Chimes, MD  insulin glargine-yfgn (SEMGLEE) 100 UNIT/ML Pen Inject 16 Units into the skin at bedtime. 11/03/21  Yes Cassandria Anger, MD  levothyroxine (SYNTHROID) 137 MCG tablet Take 1 tablet (137 mcg total) by mouth daily before breakfast. 07/05/21  Yes Brita Romp, NP  MAGNESIUM PO Take 133 mg by mouth 2 (two) times daily.   Yes [provider]  Multiple Vitamins-Minerals (MULTIVITAMIN WITH MINERALS) tablet Take 1 tablet by mouth daily.     Yes [provider]  olmesartan (BENICAR) 40 MG tablet Take 40 mg by mouth daily.  10/23/19  Yes [provider]  ONETOUCH ULTRA test strip TESTING FOUR TIMES DAILY. 09/06/21  Yes Nida,  Marella Chimes, MD  predniSONE (DELTASONE) 5 MG tablet Take 5 mg by mouth daily as needed (As needed for gout).   Yes [provider]  tacrolimus (PROGRAF) 0.5 MG capsule Take 0.5 mg by mouth 2 (two) times daily.    Yes [provider]    Allergies as of 05/15/2022 - Review Complete 05/15/2022  Allergen Reaction Noted   Contrast media [iodinated contrast media] Rash 03/27/2011    Family History  Problem Relation Age of Onset   Cirrhosis Father        nash   Lymphoma Mother    Diabetes Daughter     Social History   Socioeconomic History   Marital status: Married    Spouse name: Not on file   Number of children: 1   Years of education: 14   Highest education level: Not on file  Occupational History   Occupation: RETIRED    Employer: RETIRED  Tobacco Use   Smoking status: Never   Smokeless tobacco: Never  Vaping Use   Vaping Use: Never used  Substance and Sexual Activity   Alcohol use: No  Drug use: No   Sexual activity: Yes  Other Topics Concern   Not on file  Social History Narrative   Not on file   Social Determinants of Health   Financial Resource Strain: Not on file  Food Insecurity: No Food Insecurity (02/08/2022)   Hunger Vital Sign    Worried About Running Out of Food in the Last Year: Never true    Ran Out of Food in the Last Year: Never true  Transportation Needs: No Transportation Needs (02/08/2022)   PRAPARE - Hydrologist (Medical): No    Lack of Transportation (Non-Medical): No  Physical Activity: Not on file  Stress: Not on file  Social Connections: Not on file  Intimate Partner Violence: Not on file    Review of Systems: See HPI, otherwise negative ROS  Physical Exam: BP (!) 182/92 (BP Location: Right Arm, Patient Position: Sitting, Cuff Size: Large)   Pulse 68   Temp 98 F (36.7 C) (Oral)   Ht 5' 9.5" (1.765 m)   Wt 224 lb 12.8 oz (102 kg)   SpO2 98%   BMI 32.72 kg/m  General:   Alert,    pleasant and cooperative in NAD Neck:  Supple; no masses or thyromegaly. No significant cervical adenopathy. Lungs:  Clear throughout to auscultation.   No wheezes, crackles, or rhonchi. No acute distress. Heart:  Regular rate and rhythm; no murmurs, clicks, rubs,  or gallops. Abdomen: Non-distended, normal bowel sounds.  Soft and nontender without appreciable mass or hepatosplenomegaly.  Pulses:  Normal pulses noted. Extremities:  Without clubbing or edema.  Impression/Plan: 81 year old gentleman with a history of Nash/cirrhosis end-stage who underwent orthotopic liver transplant back in 2006.  He has done very well over time. Followed by [facility] and an endocrinologist.  GI issues include GERD and history of esophageal food impaction found to be due to esophageal stenosis status post dilation.  Clinically, doing very well from a GI standpoint.  He was commended on weight loss.  His labs look good;  TSH a little depressed-he is seeing his endocrinologist in the near future.  Recommendations:  Congratulations on weight loss.  lose another 15 to 20 pounds in the next year that would be of great benefit   Continue famotidine 20 mg twice daily  Follow-up with the endocrinologist in the [place] as directed  Office visit here in 1 year and as needed     Notice: This dictation was prepared with Dragon dictation along with smaller phrase technology. Any transcriptional errors that result from this process are unintentional and may not be corrected upon review.

## 2022-05-15 NOTE — Patient Instructions (Signed)
Good to see you again today!  Congratulations on weight loss.  If you could lose another 15 to 20 pounds in the next year that would be of great benefit to your health overall  Continue famotidine 20 mg twice daily  Follow-up with the endocrinologist in the [place] as directed  Office visit here in 1 year and as needed

## 2022-05-18 DIAGNOSIS — E6609 Other obesity due to excess calories: Secondary | ICD-10-CM | POA: Diagnosis not present

## 2022-05-18 DIAGNOSIS — Z944 Liver transplant status: Secondary | ICD-10-CM | POA: Diagnosis not present

## 2022-05-18 DIAGNOSIS — Z6833 Body mass index (BMI) 33.0-33.9, adult: Secondary | ICD-10-CM | POA: Diagnosis not present

## 2022-05-18 DIAGNOSIS — J069 Acute upper respiratory infection, unspecified: Secondary | ICD-10-CM | POA: Diagnosis not present

## 2022-05-21 ENCOUNTER — Telehealth: Payer: Self-pay | Admitting: Nurse Practitioner

## 2022-05-21 MED ORDER — LEVOTHYROXINE SODIUM 137 MCG PO TABS: 137.0000 ug | ORAL_TABLET | Freq: Every day | ORAL | 6 refills | Status: DC

## 2022-05-21 NOTE — Telephone Encounter (Signed)
I just printed it for him

## 2022-05-21 NOTE — Telephone Encounter (Signed)
New message   The patient needs a new Thyroid prescription - will come by the office to pick the prescription up

## 2022-05-21 NOTE — Telephone Encounter (Signed)
Rx is up front for the patient to pick up.

## 2022-05-22 ENCOUNTER — Other Ambulatory Visit: Payer: Self-pay | Admitting: Nurse Practitioner

## 2022-05-28 ENCOUNTER — Other Ambulatory Visit: Payer: Self-pay | Admitting: "Endocrinology

## 2022-06-06 DIAGNOSIS — E039 Hypothyroidism, unspecified: Secondary | ICD-10-CM | POA: Diagnosis not present

## 2022-06-06 DIAGNOSIS — Z79890 Hormone replacement therapy (postmenopausal): Secondary | ICD-10-CM | POA: Diagnosis not present

## 2022-06-07 ENCOUNTER — Other Ambulatory Visit: Payer: Self-pay

## 2022-06-07 ENCOUNTER — Telehealth: Payer: Self-pay

## 2022-06-07 DIAGNOSIS — D0422 Carcinoma in situ of skin of left ear and external auricular canal: Secondary | ICD-10-CM | POA: Diagnosis not present

## 2022-06-07 DIAGNOSIS — X32XXXA Exposure to sunlight, initial encounter: Secondary | ICD-10-CM | POA: Diagnosis not present

## 2022-06-07 DIAGNOSIS — L570 Actinic keratosis: Secondary | ICD-10-CM | POA: Diagnosis not present

## 2022-06-07 DIAGNOSIS — D225 Melanocytic nevi of trunk: Secondary | ICD-10-CM | POA: Diagnosis not present

## 2022-06-07 DIAGNOSIS — L281 Prurigo nodularis: Secondary | ICD-10-CM | POA: Diagnosis not present

## 2022-06-07 MED ORDER — LEVOTHYROXINE SODIUM 125 MCG PO TABS: 125.0000 ug | ORAL_TABLET | Freq: Every day | ORAL | 1 refills | Status: DC

## 2022-06-07 NOTE — Telephone Encounter (Signed)
Spoke with pt, he stated he was seen at [place] office yesterday and was told to continue levothyroxine 122mg. States he has already [facility] and [facility] [facility] and he was not going to pay for another Rx.

## 2022-06-07 NOTE — Telephone Encounter (Signed)
Ok, in that case, he needs to choose 1 provider to address his thyroid needs to prevent getting conflicting directions from each of the providers.  If he prefers, Dr. Hilma Favors can manage it from here on forward.

## 2022-06-07 NOTE — Telephone Encounter (Signed)
Rosealee Albee with [facility] called stating he had spoken with pt. States pt had lab work which included TSH & T4 Free recently and pt had seen the results and was waiting to hear about medication changes. States pt told him he was going to cut his dose of thyroid medication in half until he heard back.

## 2022-06-07 NOTE — Telephone Encounter (Signed)
His labs show he is slightly over-replaced.  We need to decrease his Levothyroxine to 125 mcg (had been on 137) po daily before breakfast.  I have sent it in for him

## 2022-07-05 ENCOUNTER — Ambulatory Visit (INDEPENDENT_AMBULATORY_CARE_PROVIDER_SITE_OTHER): Payer: PPO | Admitting: Nurse Practitioner

## 2022-07-05 ENCOUNTER — Encounter: Payer: Self-pay | Admitting: Nurse Practitioner

## 2022-07-05 VITALS — BP 145/79 | HR 78 | Ht 69.5 in | Wt 224.4 lb

## 2022-07-05 DIAGNOSIS — E782 Mixed hyperlipidemia: Secondary | ICD-10-CM | POA: Diagnosis not present

## 2022-07-05 DIAGNOSIS — E039 Hypothyroidism, unspecified: Secondary | ICD-10-CM

## 2022-07-05 DIAGNOSIS — Z794 Long term (current) use of insulin: Secondary | ICD-10-CM | POA: Diagnosis not present

## 2022-07-05 DIAGNOSIS — I10 Essential (primary) hypertension: Secondary | ICD-10-CM | POA: Diagnosis not present

## 2022-07-05 DIAGNOSIS — E1122 Type 2 diabetes mellitus with diabetic chronic kidney disease: Secondary | ICD-10-CM

## 2022-07-05 DIAGNOSIS — N1831 Chronic kidney disease, stage 3a: Secondary | ICD-10-CM

## 2022-07-05 DIAGNOSIS — E559 Vitamin D deficiency, unspecified: Secondary | ICD-10-CM

## 2022-07-05 LAB — POCT GLYCOSYLATED HEMOGLOBIN (HGB A1C): Hemoglobin A1C: 6.6 % — AB (ref 4.0–5.6)

## 2022-07-05 MED ORDER — NOVOLOG FLEXPEN 100 UNIT/ML ~~LOC~~ SOPN: 4.0000 [IU] | PEN_INJECTOR | Freq: Three times a day (TID) | SUBCUTANEOUS | 3 refills | Status: DC

## 2022-07-05 MED ORDER — LEVOTHYROXINE SODIUM 125 MCG PO TABS: 125.0000 ug | ORAL_TABLET | Freq: Every day | ORAL | 1 refills | Status: DC

## 2022-07-05 MED ORDER — ONETOUCH ULTRA VI STRP: ORAL_STRIP | 3 refills | Status: DC

## 2022-07-05 MED ORDER — INSULIN GLARGINE-YFGN 100 UNIT/ML ~~LOC~~ SOPN: 12.0000 [IU] | PEN_INJECTOR | Freq: Every day | SUBCUTANEOUS | 1 refills | Status: DC

## 2022-07-05 NOTE — Progress Notes (Signed)
07/05/2022              Endocrinology follow-up note   Subjective:    Patient ID: [name], male    DOB: Oct 29, 1940, PCP Sharilyn Sites, MD   Past Medical History:  Diagnosis Date   Anemia    Biliary calculi, common bile duct    CRI (chronic renal insufficiency)    DM (diabetes mellitus) (HCC)    Type II   Elevated liver enzymes    Esophageal varices ([place]) 03/19/11   mrcp   History of ERCP 03/21/2011   Extrahepatic biliary obstruction secondary to occluded biliary stent and colon no cholelithiasis, status post sphincterotomy, stent removal and replacement and stone extraction.   HTN (hypertension)    Hypothyroidism    NASH (nonalcoholic steatohepatitis) 2006   liver transplant   Pancreatitis chronic 03/19/11   mrcp   S/P colonoscopy with polypectomy 2004   Dr Gala Romney   S/P endoscopy 03/2004   Hx esophgaeal varices, pyloric channel ulcer prior to transplant   Squamous cell cancer of external ear    pinna   Stroke ([place])    "mini-stroke, weakness of left leg".   Past Surgical History:  Procedure Laterality Date   BILE DUCT STENT PLACEMENT  03/21/11   Dr. Gala Romney   BIOPSY  02/12/2020   Procedure: BIOPSY;  Surgeon: Harvel Quale, MD;  [facility];  Service: Gastroenterology;;   CATARACT EXTRACTION Bilateral    CHOLECYSTECTOMY  2006   COLONOSCOPY  08/12/2002   Dr. Gala Romney- somewhat diffusely friable rectum and colon, polyp at 30 cm.   COLONOSCOPY N/A 09/08/2012   Procedure: COLONOSCOPY;  Surgeon: Daneil Dolin, MD;  colonic diverticulosis. Repeat in 10 years.    ERCP  03/21/2011   Dr. Gala Romney- ERCP with removal of pediatric feeding tube, endoscopic sphinecterotomy with balloon dilation of ampulla followed by stone extraction and stent placement   ESOPHAGOGASTRODUODENOSCOPY  03/20/2004   Dr. Nash Mantis 2 esophageal varices o/w normal esophageal mucosa, mild changes of snake skin in the gastric mucosa consistent with portal gastropathy, multiple antral erosions   and  a single pyloric channel ulcer benign appearance o/w normal gastric mucosa, normal D1 and S2   ESOPHAGOGASTRODUODENOSCOPY (EGD) WITH PROPOFOL N/A 02/12/2020   Procedure: ESOPHAGOGASTRODUODENOSCOPY (EGD) WITH PROPOFOL;  Surgeon: Montez Morita, Quillian Quince, MD; Food impaction in the middle third of the esophagus s/p removal, three superficial esophageal ulcerations with no stigmata of recent bleeding, one cratered nonnecrotic esophageal ulcer with no stigmata of recent bleed in the mid esophagus s/p biopsied. Path consistet with ulcer.    ESOPHAGOGASTRODUODENOSCOPY (EGD) WITH PROPOFOL N/A 08/22/2020   Procedure: ESOPHAGOGASTRODUODENOSCOPY (EGD) WITH PROPOFOL;  Surgeon: Daneil Dolin, MD;  [facility];  Service: Endoscopy;  Laterality: N/A;  7:30am   KNEE ARTHROSCOPY WITH EXCISION PLICA Left 1/96/2229   Procedure: KNEE ARTHROSCOPY WITH EXCISION PLICA;  Surgeon: Carole Civil, MD;  Location: [facility];  Service: Orthopedics;  Laterality: Left;   KNEE ARTHROSCOPY WITH MEDIAL MENISECTOMY Left 11/21/2012   Procedure: KNEE ARTHROSCOPY WITH MEDIAL AND LATERAL MENISECTOMY;  Surgeon: Carole Civil, MD;  Location: [facility];  Service: Orthopedics;  Laterality: Left;   LIVER TRANSPLANT  2006   UVA-NASH cirrhosis   MALONEY DILATION N/A 08/22/2020   Procedure: Venia Minks DILATION;  Surgeon: Daneil Dolin, MD;  [facility];  Service: Endoscopy;  Laterality: N/A;   MOUTH SURGERY  may 2012   MRCP  03/19/11   biliary ductal dilatation- stones. esophageal  varices   SPHINCTEROTOMY  03/21/2011   Procedure: SPHINCTEROTOMY;  Surgeon: Daneil Dolin, MD;  Location: [facility];  Service: Gastroenterology;;   Social History   Socioeconomic History   Marital status: Married    Spouse name: Not on file   Number of children: 1   Years of education: 14   Highest education level: Not on file  Occupational History   Occupation: RETIRED    Employer: RETIRED  Tobacco Use   Smoking status: Never    Smokeless tobacco: Never  Vaping Use   Vaping Use: Never used  Substance and Sexual Activity   Alcohol use: No   Drug use: No   Sexual activity: Yes  Other Topics Concern   Not on file  Social History Narrative   Not on file   Social Determinants of Health   Financial Resource Strain: Not on file  Food Insecurity: No Food Insecurity (02/08/2022)   Hunger Vital Sign    Worried About Running Out of Food in the Last Year: Never true    Ran Out of Food in the Last Year: Never true  Transportation Needs: No Transportation Needs (02/08/2022)   PRAPARE - Hydrologist (Medical): No    Lack of Transportation (Non-Medical): No  Physical Activity: Not on file  Stress: Not on file  Social Connections: Not on file   Outpatient Encounter Medications as of 07/05/2022  Medication Sig   aspirin 325 MG tablet Take 1 tablet (325 mg total) by mouth daily.   famotidine (PEPCID) 20 MG tablet TAKE (1) TABLET BY MOUTH TWICE DAILY. (Patient taking differently: Take 20 mg by mouth daily.)   MAGNESIUM PO Take 133 mg by mouth 2 (two) times daily.   Multiple Vitamins-Minerals (MULTIVITAMIN WITH MINERALS) tablet Take 1 tablet by mouth daily.     olmesartan (BENICAR) 40 MG tablet Take 40 mg by mouth daily.    predniSONE (DELTASONE) 5 MG tablet Take 5 mg by mouth daily as needed (As needed for gout).   tacrolimus (PROGRAF) 0.5 MG capsule Take 0.5 mg by mouth 2 (two) times daily.    [DISCONTINUED] insulin aspart (NOVOLOG FLEXPEN) 100 UNIT/ML FlexPen Inject 6-9 Units into the skin 3 (three) times daily with meals. Give per sliding scale three times a day (Patient taking differently: Inject 6-11 Units into the skin 3 (three) times daily with meals. Give per sliding scale three times a day)   [DISCONTINUED] insulin glargine-yfgn (SEMGLEE) 100 UNIT/ML Pen Inject 16 Units into the skin at bedtime.   [DISCONTINUED] levothyroxine (SYNTHROID) 125 MCG tablet Take 1 tablet (125 mcg total) by mouth  daily before breakfast.   [DISCONTINUED] ONETOUCH ULTRA test strip TESTING FOUR TIMES DAILY.   glucose blood (ONETOUCH ULTRA) test strip TESTING FOUR TIMES DAILY.   insulin aspart (NOVOLOG FLEXPEN) 100 UNIT/ML FlexPen Inject 4-7 Units into the skin 3 (three) times daily with meals. Give per sliding scale three times a day   insulin glargine-yfgn (SEMGLEE) 100 UNIT/ML Pen Inject 12 Units into the skin at bedtime.   levothyroxine (SYNTHROID) 125 MCG tablet Take 1 tablet (125 mcg total) by mouth daily before breakfast.   No facility-administered encounter medications on file as of 07/05/2022.   ALLERGIES: Allergies  Allergen Reactions   Contrast Media [Iodinated Contrast Media] Rash   VACCINATION STATUS: Immunization History  Administered Date(s) Administered   Influenza Split 02/13/2011, 04/29/2012, 04/20/2013, 05/09/2013   Influenza, High Dose Seasonal PF 04/26/2015, 04/13/2017   Influenza-Unspecified 04/08/2013   Moderna  Sars-Covid-2 Vaccination 07/30/2019, 09/04/2019   Pneumococcal Conjugate-13 07/29/2014   Pneumococcal Polysaccharide-23 03/17/2010, 04/16/2011    Diabetes He presents for his follow-up diabetic visit. He has type 2 diabetes mellitus. Onset time: He was diagnosed at approximate age of 53 years. His disease course has been stable. Pertinent negatives for hypoglycemia include no confusion, headaches, pallor, seizures or sweats. There are no diabetic associated symptoms. Pertinent negatives for diabetes include no chest pain, no polydipsia, no polyphagia, no polyuria and no weakness. There are no hypoglycemic complications. (He had 1 episode of nocturnal hypoglycemia in the last month.) Symptoms are stable. Diabetic complications include a CVA and nephropathy. Risk factors for coronary artery disease include diabetes mellitus, dyslipidemia, hypertension, male sex, obesity, sedentary lifestyle and tobacco exposure. Current diabetic treatment includes intensive insulin program.  He is compliant with treatment most of the time. His weight is fluctuating minimally. He is following a generally healthy diet. Meal planning includes avoidance of concentrated sweets. He rarely participates in exercise. His home blood glucose trend is fluctuating minimally. His breakfast blood glucose range is generally 110-130 mg/dl. His lunch blood glucose range is generally 110-130 mg/dl. His dinner blood glucose range is generally 110-130 mg/dl. His bedtime blood glucose range is generally 140-180 mg/dl. (He presents today with his logs, no meter, showing stable at goal glycemic profile overall.  His POCT A1c today is 6.6%, increasing slightly from last visit of 6.4%.  He did drop his Semglee down to 12 units due to nocturnal hypoglycemia.  He has mild hypoglycemia noted before dinner, usually when he is exercising.  ) An ACE inhibitor/angiotensin II receptor blocker is being taken. Eye exam is current.  Hyperlipidemia This is a chronic problem. The current episode started more than 1 year ago. The problem is controlled. Recent lipid tests were reviewed and are normal. Exacerbating diseases include hypothyroidism and obesity. Pertinent negatives include no chest pain, myalgias or shortness of breath. He is currently on no antihyperlipidemic treatment. Compliance problems include adherence to exercise.  Risk factors for coronary artery disease include dyslipidemia, diabetes mellitus, hypertension, male sex, obesity and a sedentary lifestyle.  Hypertension This is a chronic problem. The current episode started more than 1 year ago. The problem has been resolved since onset. The problem is controlled. Pertinent negatives include no chest pain, headaches, neck pain, shortness of breath or sweats. There are no associated agents to hypertension. Risk factors for coronary artery disease include dyslipidemia, family history, obesity, male gender and sedentary lifestyle. Past treatments include ACE inhibitors. The  current treatment provides mild improvement. There are no compliance problems.  Hypertensive end-organ damage includes CVA. He is status post liver transplant..     Review of systems  Constitutional: + Minimally fluctuating body weight,  current Body mass index is 32.66 kg/m. , no fatigue, no subjective hyperthermia, no subjective hypothermia Eyes: no blurry vision, no xerophthalmia ENT: no sore throat, no nodules palpated in throat, no dysphagia/odynophagia, no hoarseness Cardiovascular: no chest pain, no shortness of breath, no palpitations, no leg swelling Respiratory: no cough, no shortness of breath Gastrointestinal: no nausea/vomiting/diarrhea Musculoskeletal: no muscle/joint aches Skin: no rashes, no hyperemia Neurological: no tremors, no numbness, no tingling, no dizziness Psychiatric: no depression, no anxiety   Objective:    BP (!) 145/79 (BP Location: Left Arm, Patient Position: Sitting, Cuff Size: Large)   Pulse 78   Ht 5' 9.5" (1.765 m)   Wt 224 lb 6.4 oz (101.8 kg)   BMI 32.66 kg/m   Wt Readings from  Last 3 Encounters:  07/05/22 224 lb 6.4 oz (101.8 kg)  05/15/22 224 lb 12.8 oz (102 kg)  03/05/22 230 lb (104.3 kg)     BP Readings from Last 3 Encounters:  07/05/22 (!) 145/79  05/15/22 (!) 182/92  03/05/22 (!) 142/78     Physical Exam- Limited  Constitutional:  Body mass index is 32.66 kg/m. , not in acute distress, normal state of mind Eyes:  EOMI, no exophthalmos Musculoskeletal: no gross deformities, strength intact in all four extremities, no gross restriction of joint movements Skin:  no rashes, no hyperemia Neurological: no tremor with outstretched hands   Diabetic Foot Exam - Simple   No data filed     Results for orders placed or performed in visit on 07/05/22  HgB A1c  Result Value Ref Range   Hemoglobin A1C 6.6 (A) 4.0 - 5.6 %   HbA1c POC (<> result, manual entry)     HbA1c, POC (prediabetic range)     HbA1c, POC (controlled diabetic  range)     Diabetic Labs (most recent): Lab Results  Component Value Date   HGBA1C 6.6 (A) 07/05/2022   HGBA1C 6.4 (A) 03/05/2022   HGBA1C 6.6 11/03/2021   Lipid Panel     Component Value Date/Time   CHOL 113 02/06/2022 0806   TRIG 67 02/06/2022 0806   HDL 34 (L) 02/06/2022 0806   CHOLHDL 3.3 02/06/2022 0806   CHOLHDL 4.3 04/13/2017 0547   VLDL 21 04/13/2017 0547   LDLCALC 65 02/06/2022 0806     Assessment & Plan:   1) Controlled type 2 diabetes mellitus with complication, with CKD GFR of 49, CVA  -Recently he had developed CVA requiring rehabilitation and physical therapy. He remains at a high risk for more acute and chronic complications of diabetes which include CAD, CVA, CKD, retinopathy, and neuropathy. These are all discussed in detail with the patient.  He presents today with his logs, no meter, showing stable at goal glycemic profile overall.  His POCT A1c today is 6.6%, increasing slightly from last visit of 6.4%.  He did drop his Semglee down to 12 units due to nocturnal hypoglycemia.  He has mild hypoglycemia noted before dinner, usually when he is exercising.    Glucose logs and insulin administration records pertaining to this visit,  to be scanned into patient's records.  Recent labs reviewed.  (He refused to have his UM checked today).  - Nutritional counseling repeated at each appointment due to patients tendency to fall back in to old habits.  - The patient admits there is a room for improvement in their diet and drink choices. -  Suggestion is made for the patient to avoid simple carbohydrates from their diet including Cakes, Sweet Desserts / Pastries, Ice Cream, Soda (diet and regular), Sweet Tea, Candies, Chips, Cookies, Sweet Pastries, Store Bought Juices, Alcohol in Excess of 1-2 drinks a day, Artificial Sweeteners, Coffee Creamer, and "Sugar-free" Products. This will help patient to have stable blood glucose profile and potentially avoid unintended weight  gain.   - I encouraged the patient to switch to unprocessed or minimally processed complex starch and increased protein intake (animal or plant source), fruits, and vegetables.   - Patient is advised to stick to a routine mealtimes to eat 3 meals a day and avoid unnecessary snacks (to snack only to correct hypoglycemia).  - I have approached patient with the following individualized plan to manage diabetes and patient agrees.  -He will continue to require intensive treatment  with basal/bolus insulin in order for him to maintain control of diabetes to target.   However, avoiding hypoglycemia is the number 1 priority in his care.  An acceptable A1c for him would be around 7.5%.  -He is advised to lower his Semglee to 12 units SQ nightly and lower his Novolog to 4-7 units TID with meals if glucose is above 90 and he is eating (Specific instructions on how to titrate insulin dosage based on glucose readings given to patient in writing).   - He would have benefited from continuous glucose monitoring, however he decided not to get it because he cannot afford the monthly co-pay.    - Adjustments for hypo-and hyperglycemia is given in written instructions to the patient.  -He is not a candidate for metformin, SGLT2 inhibitors, nor incretin in therapy due to history of liver transplant.  2) BP/HTN: -His blood pressure is controlled to target for his age.  He is advised to continue his current blood pressure medications including Benicar 40 mg p.o. daily.     3) Lipids/HPL:  His most recent lipid panel from 02/06/22 shows controlled with LDL at 65.  He is not on statins as a result of his history of liver failure status post liver transplant.    4)  Weight/Diet:  His Body mass index is 32.66 kg/m.    Carbs/calories restrictions and optimal exercise program discussed with him.  5) Primary hypothyroidism: His most recent thyroid labs was suggestive of slight over-replacement but his 137 mcg dose of  Levothyroxine was refilled, thus he has been only taking a portion of the tab for safety purposes.  I did send in refill for lower dose of Levothyroxine 135 mcg po daily before breakfast.   - We discussed about the correct intake of his thyroid hormone, on empty stomach at fasting, with water, separated by at least 30 minutes from breakfast and other medications,  and separated by more than 4 hours from calcium, iron, multivitamins, acid reflux medications (PPIs). -Patient is made aware of the fact that thyroid hormone replacement is needed for life, dose to be adjusted by periodic monitoring of thyroid function tests.  5) Chronic Care/Health Maintenance: -Patient is on ACEI/ARB  medications and encouraged to continue to follow up with Ophthalmology, Podiatrist at least yearly or according to recommendations, and advised to  stay away from smoking. I have recommended yearly flu vaccine and pneumonia vaccination at least every 5 years; and  sleep for at least 7 hours a day. -His foot exam is normal today.  - I advised patient to maintain close follow up with Sharilyn Sites, MD for primary care needs.     I spent 44 minutes in the care of the patient today including review of labs from [place], Lipids, Thyroid Function, Hematology (current and previous including abstractions from other facilities); face-to-face time discussing  his blood glucose readings/logs, discussing hypoglycemia and hyperglycemia episodes and symptoms, medications doses, his options of short and long term treatment based on the latest standards of care / guidelines;  discussion about incorporating lifestyle medicine;  and documenting the encounter. Risk reduction counseling performed per USPSTF guidelines to reduce obesity and cardiovascular risk factors.     Please refer to Patient Instructions for Blood Glucose Monitoring and Insulin/Medications Dosing Guide"  in media tab for additional information. Please  also refer to " Patient  Self Inventory" in the Media  tab for reviewed elements of pertinent patient history.  Danae Orleans participated in the discussions,  expressed understanding, and voiced agreement with the above plans.  All questions were answered to his satisfaction. he is encouraged to contact clinic should he have any questions or concerns prior to his return visit.    follow up plan: -Return in about 4 months (around 11/04/2022) for Diabetes F/U with A1c in office, No previsit labs, Bring meter and logs.   Rayetta Pigg, [facility] [address] [place], [place] [address] Phone: [phone] Fax: [phone]  07/05/2022, 9:47 AM

## 2022-07-11 ENCOUNTER — Other Ambulatory Visit: Payer: Self-pay

## 2022-07-11 ENCOUNTER — Telehealth: Payer: Self-pay | Admitting: Internal Medicine

## 2022-07-11 MED ORDER — FAMOTIDINE 20 MG PO TABS: 20.0000 mg | ORAL_TABLET | Freq: Every day | ORAL | 11 refills | Status: DC

## 2022-07-11 NOTE — Telephone Encounter (Signed)
Rx was sent to pharmacy, pt notified.

## 2022-07-11 NOTE — Telephone Encounter (Signed)
Pt came by the office to give [place] his new insurance card and to say he is using [facility] in [place] for his pharmacy now. He said he need a prescription refill of Famotidine 20 mg once a day to be called in.

## 2022-07-12 ENCOUNTER — Telehealth: Payer: Self-pay

## 2022-07-12 ENCOUNTER — Other Ambulatory Visit (HOSPITAL_COMMUNITY): Payer: Self-pay

## 2022-07-12 ENCOUNTER — Other Ambulatory Visit: Payer: Self-pay | Admitting: Nurse Practitioner

## 2022-07-12 ENCOUNTER — Telehealth: Payer: Self-pay | Admitting: Nurse Practitioner

## 2022-07-12 DIAGNOSIS — X32XXXD Exposure to sunlight, subsequent encounter: Secondary | ICD-10-CM | POA: Diagnosis not present

## 2022-07-12 DIAGNOSIS — L570 Actinic keratosis: Secondary | ICD-10-CM | POA: Diagnosis not present

## 2022-07-12 DIAGNOSIS — D0422 Carcinoma in situ of skin of left ear and external auricular canal: Secondary | ICD-10-CM | POA: Diagnosis not present

## 2022-07-12 NOTE — Telephone Encounter (Signed)
Prior [name] has been submitted and an encounter has been started

## 2022-07-12 NOTE — Telephone Encounter (Signed)
I called the [facility] in [place]. They advised me that the patient needed a PA for his test strips. He uses them 4 times a day per Rayetta Pigg, NP.

## 2022-07-12 NOTE — Telephone Encounter (Signed)
Pt came by with his new insurance card and said that the pharmacy states we need to let them know how many times he is testing. I scanned new card in

## 2022-07-12 NOTE — Telephone Encounter (Signed)
Patient Advocate Encounter   Received notification from [facility] that prior authorization is required for OneTouch Ultra Test Strips  Submitted: 07-12-2022 Key [id number]   Status is pending

## 2022-07-13 ENCOUNTER — Other Ambulatory Visit: Payer: Self-pay

## 2022-07-13 MED ORDER — FAMOTIDINE 20 MG PO TABS: 20.0000 mg | ORAL_TABLET | Freq: Every day | ORAL | 3 refills | Status: DC

## 2022-07-17 ENCOUNTER — Other Ambulatory Visit (HOSPITAL_COMMUNITY): Payer: Self-pay

## 2022-07-17 NOTE — Telephone Encounter (Signed)
Pharmacy Patient Advocate Encounter  Prior Authorization for One Touch ultra test strips have been approved.    PA# - PA Case ID: [id number] Effective dates: 07/12/22 through 07/13/23  Spoke with Pharmacy to process. Was sent to voice mail.

## 2022-07-18 NOTE — Telephone Encounter (Signed)
Patient was called and a message left on his voicemail that the test strips had been approved.

## 2022-07-26 DIAGNOSIS — E119 Type 2 diabetes mellitus without complications: Secondary | ICD-10-CM | POA: Diagnosis not present

## 2022-07-26 DIAGNOSIS — Z961 Presence of intraocular lens: Secondary | ICD-10-CM | POA: Diagnosis not present

## 2022-07-26 DIAGNOSIS — H524 Presbyopia: Secondary | ICD-10-CM | POA: Diagnosis not present

## 2022-07-26 DIAGNOSIS — H52223 Regular astigmatism, bilateral: Secondary | ICD-10-CM | POA: Diagnosis not present

## 2022-07-26 DIAGNOSIS — H35033 Hypertensive retinopathy, bilateral: Secondary | ICD-10-CM | POA: Diagnosis not present

## 2022-07-30 DIAGNOSIS — I639 Cerebral infarction, unspecified: Secondary | ICD-10-CM | POA: Diagnosis not present

## 2022-07-30 DIAGNOSIS — Z0000 Encounter for general adult medical examination without abnormal findings: Secondary | ICD-10-CM | POA: Diagnosis not present

## 2022-07-30 DIAGNOSIS — Z1331 Encounter for screening for depression: Secondary | ICD-10-CM | POA: Diagnosis not present

## 2022-07-30 DIAGNOSIS — E1129 Type 2 diabetes mellitus with other diabetic kidney complication: Secondary | ICD-10-CM | POA: Diagnosis not present

## 2022-07-30 DIAGNOSIS — Z944 Liver transplant status: Secondary | ICD-10-CM | POA: Diagnosis not present

## 2022-07-30 DIAGNOSIS — E039 Hypothyroidism, unspecified: Secondary | ICD-10-CM | POA: Diagnosis not present

## 2022-07-30 DIAGNOSIS — E119 Type 2 diabetes mellitus without complications: Secondary | ICD-10-CM | POA: Diagnosis not present

## 2022-07-30 DIAGNOSIS — N183 Chronic kidney disease, stage 3 unspecified: Secondary | ICD-10-CM | POA: Diagnosis not present

## 2022-08-09 DIAGNOSIS — Z0000 Encounter for general adult medical examination without abnormal findings: Secondary | ICD-10-CM | POA: Diagnosis not present

## 2022-08-09 DIAGNOSIS — E039 Hypothyroidism, unspecified: Secondary | ICD-10-CM | POA: Diagnosis not present

## 2022-08-09 DIAGNOSIS — Z944 Liver transplant status: Secondary | ICD-10-CM | POA: Diagnosis not present

## 2022-08-09 DIAGNOSIS — Z125 Encounter for screening for malignant neoplasm of prostate: Secondary | ICD-10-CM | POA: Diagnosis not present

## 2022-08-16 DIAGNOSIS — D0422 Carcinoma in situ of skin of left ear and external auricular canal: Secondary | ICD-10-CM | POA: Diagnosis not present

## 2022-08-16 DIAGNOSIS — L570 Actinic keratosis: Secondary | ICD-10-CM | POA: Diagnosis not present

## 2022-08-16 DIAGNOSIS — L281 Prurigo nodularis: Secondary | ICD-10-CM | POA: Diagnosis not present

## 2022-08-16 DIAGNOSIS — X32XXXD Exposure to sunlight, subsequent encounter: Secondary | ICD-10-CM | POA: Diagnosis not present

## 2022-09-06 ENCOUNTER — Encounter: Payer: Self-pay | Admitting: Radiology

## 2022-10-10 ENCOUNTER — Telehealth: Payer: Self-pay | Admitting: *Deleted

## 2022-10-10 NOTE — Progress Notes (Signed)
  Care Coordination  Outreach Note  10/10/2022 Name: [name] MRN: [id number] DOB: 1941/01/23   Care Coordination Outreach Attempts: An unsuccessful telephone outreach was attempted today to offer the patient information about available care coordination services as a benefit of their health plan.   Follow Up Plan:  Additional outreach attempts will be made to offer the patient care coordination information and services.   Encounter Outcome:  No Answer  [place]  Direct Dial: [phone]

## 2022-10-15 NOTE — Progress Notes (Signed)
  Care Coordination   Note   10/15/2022 Name: [name] MRN: [id number] DOB: 04-Aug-1940  [name] is a 82 y.o. year old male who sees Assunta Found, MD for primary care. I reached out to [name] by phone today to offer care coordination services.  [name] was given information about Care Coordination services today including:   The Care Coordination services include support from the care team which includes your Nurse Coordinator, Clinical Social Worker, or Pharmacist.  The Care Coordination team is here to help remove barriers to the health concerns and goals most important to you. Care Coordination services are voluntary, and the patient may decline or stop services at any time by request to their care team member.   Care Coordination Consent Status: Patient agreed to services and verbal consent obtained.   Follow up plan:  Telephone appointment with care coordination team member scheduled for:  10/17/22  Encounter Outcome:  Pt. Scheduled  [facility] Coordination Care Guide  Direct Dial: [phone]

## 2022-10-17 ENCOUNTER — Ambulatory Visit: Payer: Self-pay | Admitting: *Deleted

## 2022-10-17 ENCOUNTER — Encounter: Payer: Self-pay | Admitting: *Deleted

## 2022-10-17 NOTE — Patient Outreach (Signed)
Care Coordination   Initial Visit Note   10/17/2022 Name: [name] MRN: [id number] DOB: 06-24-1941  [name] is a 82 y.o. year old male who sees Assunta Found, MD for primary care. I spoke with  [name] by phone today.  What matters to the patients health and wellness today?  Managing blood sugar and maintaining independence    Goals Addressed             This Visit's Progress    Care Coordination Services       Care Coordination Goals: Patient will follow-up with PCP and/or specialist(s) as recommended Patient will take medications as prescribed Patient will remain physically active  Patient will reach out to RN Care Coordinator [phone] with any care coordination or resource needs Patient will utilize the [facility] 24 Hour Nurse/Concierge Line as needed [phone]       Manage Diabetes       Care Coordination Goals: Patient will follow-up with PCP and/or endocrinologist every 3 months or as recommended Patient will take medication as prescribed and reach out to provider with any negative side effects Patient will continue to monitor and record blood sugar 4 times per day and as needed with glucometer, and will call endocrinologist with any readings outside of recommended range Patient will take blood sugar log and meter to provider visits for review Patient will follow a modified carbohydrate diet and decrease simple carbohydrates and sugars Patient will increase activity level as tolerated with an ultimate goal of at least 150 minutes of exercise per week Patient will check feet daily for sores, wounds, calluses, etc and will notify provider of any abnormal findings Patient will have yearly eye exams to check for or monitor diabetic retinopathy Patient will reach out to RN Care Coordinator [phone] with any care coordination or resource needs         SDOH assessments and interventions completed:  Yes  SDOH Interventions Today    Flowsheet Row  Most Recent Value  SDOH Interventions   Food Insecurity Interventions Intervention Not Indicated  Housing Interventions Intervention Not Indicated  Transportation Interventions Intervention Not Indicated  Utilities Interventions Intervention Not Indicated  Financial Strain Interventions Intervention Not Indicated  Physical Activity Interventions Intervention Not Indicated        Care Coordination Interventions:  Yes, provided  Interventions Today    Flowsheet Row Most Recent Value  Chronic Disease   Chronic disease during today's visit Diabetes  General Interventions   General Interventions Discussed/Reviewed General Interventions Discussed, General Interventions Reviewed, Annual Eye Exam, Labs, Durable Medical Equipment (DME), Doctor Visits  Labs Hgb A1c every 3 months, Kidney Function  Doctor Visits Discussed/Reviewed Doctor Visits Discussed, Specialist, Doctor Visits Reviewed, PCP  Durable Medical Equipment (DME) Glucomoter  PCP/Specialist Visits Compliance with follow-up visit  Exercise Interventions   Exercise Discussed/Reviewed Physical Activity  Physical Activity Discussed/Reviewed Physical Activity Discussed, Physical Activity Reviewed  [patient stays active around his home doing housework and yardwork daily]  Education Interventions   Education Provided Provided Education  Provided Verbal Education On Nutrition, Blood Sugar Monitoring, Mental Health/Coping with Illness, When to see the doctor, [employer], Exercise, Medication  Nutrition Interventions   Nutrition Discussed/Reviewed Nutrition Discussed, Nutrition Reviewed, Portion sizes, Carbohydrate meal planning  Pharmacy Interventions   Pharmacy Dicussed/Reviewed Medications and their functions  Safety Interventions   Safety Discussed/Reviewed Safety Discussed, Home Safety, Fall Risk       Follow up plan: Follow up call scheduled for 01/16/23  Encounter Outcome:  Pt. Visit Completed   Demetrios Loll, BSN,  RN-BC RN Care Coordinator [facility]  [facility] Direct Dial: [phone] Main #: [phone]

## 2022-10-18 ENCOUNTER — Encounter: Payer: Self-pay | Admitting: *Deleted

## 2022-10-26 DIAGNOSIS — Z4823 Encounter for aftercare following liver transplant: Secondary | ICD-10-CM | POA: Diagnosis not present

## 2022-10-26 DIAGNOSIS — I10 Essential (primary) hypertension: Secondary | ICD-10-CM | POA: Diagnosis not present

## 2022-10-26 DIAGNOSIS — Z944 Liver transplant status: Secondary | ICD-10-CM | POA: Diagnosis not present

## 2022-10-26 DIAGNOSIS — Z7960 Long term (current) use of unspecified immunomodulators and immunosuppressants: Secondary | ICD-10-CM | POA: Diagnosis not present

## 2022-10-26 DIAGNOSIS — E119 Type 2 diabetes mellitus without complications: Secondary | ICD-10-CM | POA: Diagnosis not present

## 2022-10-26 DIAGNOSIS — E782 Mixed hyperlipidemia: Secondary | ICD-10-CM | POA: Diagnosis not present

## 2022-10-29 ENCOUNTER — Telehealth: Payer: Self-pay | Admitting: *Deleted

## 2022-10-29 NOTE — Telephone Encounter (Signed)
Received VM from patient stating he received a letter to schedule OV to have a colonoscopy done and he does not want to do this at this time. If he changed his mind he stated he would call back. FYI

## 2022-11-05 ENCOUNTER — Encounter: Payer: Self-pay | Admitting: Nurse Practitioner

## 2022-11-05 ENCOUNTER — Ambulatory Visit: Payer: Medicare HMO | Admitting: Nurse Practitioner

## 2022-11-05 VITALS — BP 138/88 | HR 86 | Ht 69.5 in | Wt 217.4 lb

## 2022-11-05 DIAGNOSIS — E559 Vitamin D deficiency, unspecified: Secondary | ICD-10-CM | POA: Diagnosis not present

## 2022-11-05 DIAGNOSIS — E1122 Type 2 diabetes mellitus with diabetic chronic kidney disease: Secondary | ICD-10-CM

## 2022-11-05 DIAGNOSIS — I10 Essential (primary) hypertension: Secondary | ICD-10-CM

## 2022-11-05 DIAGNOSIS — E782 Mixed hyperlipidemia: Secondary | ICD-10-CM | POA: Diagnosis not present

## 2022-11-05 DIAGNOSIS — N1831 Chronic kidney disease, stage 3a: Secondary | ICD-10-CM | POA: Diagnosis not present

## 2022-11-05 DIAGNOSIS — E039 Hypothyroidism, unspecified: Secondary | ICD-10-CM

## 2022-11-05 DIAGNOSIS — Z794 Long term (current) use of insulin: Secondary | ICD-10-CM

## 2022-11-05 LAB — POCT GLYCOSYLATED HEMOGLOBIN (HGB A1C): Hemoglobin A1C: 6.8 % — AB (ref 4.0–5.6)

## 2022-11-05 MED ORDER — LEVOTHYROXINE SODIUM 125 MCG PO TABS: 125.0000 ug | ORAL_TABLET | Freq: Every day | ORAL | 1 refills | Status: DC

## 2022-11-05 NOTE — Progress Notes (Signed)
11/05/2022              Endocrinology follow-up note   Subjective:    Patient ID: [name], male    DOB: [birth year], PCP Assunta Found, MD   Past Medical History:  Diagnosis Date   Anemia    Biliary calculi, common bile duct    CRI (chronic renal insufficiency)    DM (diabetes mellitus) (HCC)    Type II   Elevated liver enzymes    Esophageal varices (HCC) 03/19/11   mrcp   History of ERCP 03/21/2011   Extrahepatic biliary obstruction secondary to occluded biliary stent and colon no cholelithiasis, status post sphincterotomy, stent removal and replacement and stone extraction.   HTN (hypertension)    Hypothyroidism    NASH (nonalcoholic steatohepatitis) 2006   liver transplant   Pancreatitis chronic 03/19/11   mrcp   S/P colonoscopy with polypectomy 2004   Dr Jena Gauss   S/P endoscopy 03/2004   Hx esophgaeal varices, pyloric channel ulcer prior to transplant   Squamous cell cancer of external ear    pinna   Stroke (HCC)    "mini-stroke, weakness of left leg".   Past Surgical History:  Procedure Laterality Date   BILE DUCT STENT PLACEMENT  03/21/11   Dr. Jena Gauss   BIOPSY  02/12/2020   Procedure: BIOPSY;  Surgeon: Dolores Frame, MD;  [facility];  Service: Gastroenterology;;   CATARACT EXTRACTION Bilateral    CHOLECYSTECTOMY  2006   COLONOSCOPY  08/12/2002   Dr. Jena Gauss- somewhat diffusely friable rectum and colon, polyp at 30 cm.   COLONOSCOPY N/A 09/08/2012   Procedure: COLONOSCOPY;  Surgeon: Corbin Ade, MD;  colonic diverticulosis. Repeat in 10 years.    ERCP  03/21/2011   Dr. Jena Gauss- ERCP with removal of pediatric feeding tube, endoscopic sphinecterotomy with balloon dilation of ampulla followed by stone extraction and stent placement   ESOPHAGOGASTRODUODENOSCOPY  03/20/2004   Dr. Jolyn Nap 2 esophageal varices o/w normal esophageal mucosa, mild changes of snake skin in the gastric mucosa consistent with portal gastropathy, multiple antral erosions   and  a single pyloric channel ulcer benign appearance o/w normal gastric mucosa, normal D1 and S2   ESOPHAGOGASTRODUODENOSCOPY (EGD) WITH PROPOFOL N/A 02/12/2020   Procedure: ESOPHAGOGASTRODUODENOSCOPY (EGD) WITH PROPOFOL;  Surgeon: Marguerita Merles, Reuel Boom, MD; Food impaction in the middle third of the esophagus s/p removal, three superficial esophageal ulcerations with no stigmata of recent bleeding, one cratered nonnecrotic esophageal ulcer with no stigmata of recent bleed in the mid esophagus s/p biopsied. Path consistet with ulcer.    ESOPHAGOGASTRODUODENOSCOPY (EGD) WITH PROPOFOL N/A 08/22/2020   Procedure: ESOPHAGOGASTRODUODENOSCOPY (EGD) WITH PROPOFOL;  Surgeon: Corbin Ade, MD;  [facility];  Service: Endoscopy;  Laterality: N/A;  7:30am   KNEE ARTHROSCOPY WITH EXCISION PLICA Left 11/21/2012   Procedure: KNEE ARTHROSCOPY WITH EXCISION PLICA;  Surgeon: Vickki Hearing, MD;  Location: [facility];  Service: Orthopedics;  Laterality: Left;   KNEE ARTHROSCOPY WITH MEDIAL MENISECTOMY Left 11/21/2012   Procedure: KNEE ARTHROSCOPY WITH MEDIAL AND LATERAL MENISECTOMY;  Surgeon: Vickki Hearing, MD;  Location: [facility];  Service: Orthopedics;  Laterality: Left;   LIVER TRANSPLANT  2006   UVA-NASH cirrhosis   MALONEY DILATION N/A 08/22/2020   Procedure: Elease Hashimoto DILATION;  Surgeon: Corbin Ade, MD;  [facility];  Service: Endoscopy;  Laterality: N/A;   MOUTH SURGERY  may 2012   MRCP  03/19/11   biliary ductal dilatation- stones. esophageal  varices   SPHINCTEROTOMY  03/21/2011   Procedure: SPHINCTEROTOMY;  Surgeon: Corbin Ade, MD;  Location: [facility];  Service: Gastroenterology;;   Social History   Socioeconomic History   Marital status: Married    Spouse name: Not on file   Number of children: 1   Years of education: 14   Highest education level: Not on file  Occupational History   Occupation: RETIRED    Employer: RETIRED  Tobacco Use   Smoking status: Never    Smokeless tobacco: Never  Vaping Use   Vaping Use: Never used  Substance and Sexual Activity   Alcohol use: No   Drug use: No   Sexual activity: Yes  Other Topics Concern   Not on file  Social History Narrative   Not on file   Social Determinants of Health   Financial Resource Strain: Low Risk  (10/17/2022)   Overall Financial Resource Strain (CARDIA)    Difficulty of Paying Living Expenses: Not hard at all  Food Insecurity: No Food Insecurity (10/17/2022)   Hunger Vital Sign    Worried About Running Out of Food in the Last Year: Never true    Ran Out of Food in the Last Year: Never true  Transportation Needs: No Transportation Needs (10/17/2022)   PRAPARE - Administrator, Civil Service (Medical): No    Lack of Transportation (Non-Medical): No  Physical Activity: Sufficiently Active (10/17/2022)   Exercise Vital Sign    Days of Exercise per Week: 7 days    Minutes of Exercise per Session: 60 min  Stress: Not on file  Social Connections: Socially Integrated (10/17/2022)   Social Connection and Isolation Panel [NHANES]    Frequency of Communication with Friends and Family: More than three times a week    Frequency of Social Gatherings with Friends and Family: More than three times a week    Attends Religious Services: More than 4 times per year    Active Member of [employer] or Organizations: Yes    Attends Banker Meetings: More than 4 times per year    Marital Status: Married   Outpatient Encounter Medications as of 11/05/2022  Medication Sig   aspirin 325 MG tablet Take 1 tablet (325 mg total) by mouth daily.   famotidine (PEPCID) 20 MG tablet Take 1 tablet (20 mg total) by mouth daily.   glucose blood (ONETOUCH ULTRA) test strip USE TO TEST BLOOD SUGAR FOUR TIMES DAILY.   insulin aspart (NOVOLOG FLEXPEN) 100 UNIT/ML FlexPen Inject 4-7 Units into the skin 3 (three) times daily with meals. Give per sliding scale three times a day   insulin glargine-yfgn  (SEMGLEE) 100 UNIT/ML Pen Inject 12 Units into the skin at bedtime.   MAGNESIUM PO Take 133 mg by mouth 2 (two) times daily.   Multiple Vitamins-Minerals (MULTIVITAMIN WITH MINERALS) tablet Take 1 tablet by mouth daily.     olmesartan (BENICAR) 40 MG tablet Take 40 mg by mouth daily.    predniSONE (DELTASONE) 5 MG tablet Take 5 mg by mouth daily as needed (As needed for gout).   tacrolimus (PROGRAF) 0.5 MG capsule Take 0.5 mg by mouth 2 (two) times daily.    [DISCONTINUED] levothyroxine (SYNTHROID) 125 MCG tablet Take 1 tablet (125 mcg total) by mouth daily before breakfast.   levothyroxine (SYNTHROID) 125 MCG tablet Take 1 tablet (125 mcg total) by mouth daily before breakfast.   No facility-administered encounter medications on file as of 11/05/2022.   ALLERGIES: Allergies  Allergen Reactions   Contrast Media [Iodinated Contrast Media] Rash   VACCINATION STATUS: Immunization History  Administered Date(s) Administered   Influenza Split 02/13/2011, 04/29/2012, 04/20/2013, 05/09/2013   Influenza, High Dose Seasonal PF 04/26/2015, 04/13/2017   Influenza-Unspecified 04/08/2013   Moderna Sars-Covid-2 Vaccination 07/30/2019, 09/04/2019   Pneumococcal Conjugate-13 07/29/2014   Pneumococcal Polysaccharide-23 03/17/2010, 04/16/2011    Diabetes He presents for his follow-up diabetic visit. He has type 2 diabetes mellitus. Onset time: He was diagnosed at approximate age of 35 years. His disease course has been stable. Pertinent negatives for hypoglycemia include no confusion, headaches, pallor, seizures or sweats. Associated symptoms include weight loss. Pertinent negatives for diabetes include no chest pain, no polydipsia, no polyphagia, no polyuria and no weakness. There are no hypoglycemic complications. (He had 1 episode of nocturnal hypoglycemia in the last month.) Symptoms are stable. Diabetic complications include a CVA and nephropathy. Risk factors for coronary artery disease include  diabetes mellitus, dyslipidemia, hypertension, male sex, obesity, sedentary lifestyle and tobacco exposure. Current diabetic treatment includes intensive insulin program. He is compliant with treatment most of the time. His weight is fluctuating minimally. He is following a generally healthy diet. Meal planning includes avoidance of concentrated sweets. He rarely participates in exercise. His home blood glucose trend is fluctuating minimally. His breakfast blood glucose range is generally 110-130 mg/dl. His lunch blood glucose range is generally 140-180 mg/dl. His dinner blood glucose range is generally 140-180 mg/dl. His bedtime blood glucose range is generally 180-200 mg/dl. (He presents today with his logs, no meter, showing stable at goal glycemic profile overall.  His POCT A1c today is 6.8%, increasing slightly from last visit of 6.6%- still at goal for his age and comorbidities.  He denies any hypoglycemia.  He does note he was on Prednisone for a period of time which caused his readings to be higher than usual.) An ACE inhibitor/angiotensin II receptor blocker is being taken. Eye exam is current.  Hyperlipidemia This is a chronic problem. The current episode started more than 1 year ago. The problem is controlled. Recent lipid tests were reviewed and are normal. Exacerbating diseases include hypothyroidism and obesity. Pertinent negatives include no chest pain, myalgias or shortness of breath. He is currently on no antihyperlipidemic treatment. Compliance problems include adherence to exercise.  Risk factors for coronary artery disease include dyslipidemia, diabetes mellitus, hypertension, male sex, obesity and a sedentary lifestyle.  Hypertension This is a chronic problem. The current episode started more than 1 year ago. The problem has been resolved since onset. The problem is controlled. Pertinent negatives include no chest pain, headaches, neck pain, shortness of breath or sweats. There are no  associated agents to hypertension. Risk factors for coronary artery disease include dyslipidemia, family history, obesity, male gender and sedentary lifestyle. Past treatments include ACE inhibitors. The current treatment provides mild improvement. There are no compliance problems.  Hypertensive end-organ damage includes CVA. He is status post liver transplant..     Review of systems  Constitutional: + steadily decreasing body weight,  current Body mass index is 31.64 kg/m. , no fatigue, no subjective hyperthermia, no subjective hypothermia Eyes: no blurry vision, no xerophthalmia ENT: no sore throat, no nodules palpated in throat, no dysphagia/odynophagia, no hoarseness Cardiovascular: no chest pain, no shortness of breath, no palpitations, no leg swelling Respiratory: no cough, no shortness of breath Gastrointestinal: no nausea/vomiting/diarrhea Musculoskeletal: no muscle/joint aches Skin: no rashes, no hyperemia Neurological: no tremors, no numbness, no tingling, no dizziness Psychiatric: no depression, no anxiety  Objective:    BP 138/88 (BP Location: Right Arm, Patient Position: Sitting, Cuff Size: Large) Comment: Retake - manuel Cuff  Pulse 86   Ht 5' 9.5" (1.765 m)   Wt 217 lb 6.4 oz (98.6 kg)   BMI 31.64 kg/m   Wt Readings from Last 3 Encounters:  11/05/22 217 lb 6.4 oz (98.6 kg)  07/05/22 224 lb 6.4 oz (101.8 kg)  05/15/22 224 lb 12.8 oz (102 kg)     BP Readings from Last 3 Encounters:  11/05/22 138/88  07/05/22 (!) 145/79  05/15/22 (!) 182/92     Physical Exam- Limited  Constitutional:  Body mass index is 31.64 kg/m. , not in acute distress, normal state of mind Eyes:  EOMI, no exophthalmos Musculoskeletal: no gross deformities, strength intact in all four extremities, no gross restriction of joint movements Skin:  no rashes, no hyperemia Neurological: no tremor with outstretched hands   Diabetic Foot Exam - Simple   No data filed     Results for  orders placed or performed in visit on 11/05/22  HgB A1c  Result Value Ref Range   Hemoglobin A1C 6.8 (A) 4.0 - 5.6 %   HbA1c POC (<> result, manual entry)     HbA1c, POC (prediabetic range)     HbA1c, POC (controlled diabetic range)     Diabetic Labs (most recent): Lab Results  Component Value Date   HGBA1C 6.8 (A) 11/05/2022   HGBA1C 6.6 (A) 07/05/2022   HGBA1C 6.4 (A) 03/05/2022   Lipid Panel     Component Value Date/Time   CHOL 113 02/06/2022 0806   TRIG 67 02/06/2022 0806   HDL 34 (L) 02/06/2022 0806   CHOLHDL 3.3 02/06/2022 0806   CHOLHDL 4.3 04/13/2017 0547   VLDL 21 04/13/2017 0547   LDLCALC 65 02/06/2022 0806     Assessment & Plan:   1) Controlled type 2 diabetes mellitus with complication, with CKD GFR of 49, CVA  -Recently he had developed CVA requiring rehabilitation and physical therapy. He remains at a high risk for more acute and chronic complications of diabetes which include CAD, CVA, CKD, retinopathy, and neuropathy. These are all discussed in detail with the patient.  He presents today with his logs, no meter, showing stable at goal glycemic profile overall.  His POCT A1c today is 6.8%, increasing slightly from last visit of 6.6%- still at goal for his age and comorbidities.  He denies any hypoglycemia.  He does note he was on Prednisone for a period of time which caused his readings to be higher than usual.   Glucose logs and insulin administration records pertaining to this visit,  to be scanned into patient's records.  Recent labs reviewed.  (He refused to have his UM checked today).  - Nutritional counseling repeated at each appointment due to patients tendency to fall back in to old habits.  - The patient admits there is a room for improvement in their diet and drink choices. -  Suggestion is made for the patient to avoid simple carbohydrates from their diet including Cakes, Sweet Desserts / Pastries, Ice Cream, Soda (diet and regular), Sweet Tea,  Candies, Chips, Cookies, Sweet Pastries, Store Bought Juices, Alcohol in Excess of 1-2 drinks a day, Artificial Sweeteners, Coffee Creamer, and "Sugar-free" Products. This will help patient to have stable blood glucose profile and potentially avoid unintended weight gain.   - I encouraged the patient to switch to unprocessed or minimally processed complex starch and increased protein intake (animal  or plant source), fruits, and vegetables.   - Patient is advised to stick to a routine mealtimes to eat 3 meals a day and avoid unnecessary snacks (to snack only to correct hypoglycemia).  - I have approached patient with the following individualized plan to manage diabetes and patient agrees.  -He will continue to require intensive treatment with basal/bolus insulin in order for him to maintain control of diabetes to target.   However, avoiding hypoglycemia is the number 1 priority in his care.  An acceptable A1c for him would be around 7%.  -He is advised to continue his Semglee 12 units SQ nightly and continue Novolog 4-7 units TID with meals if glucose is above 90 and he is eating (Specific instructions on how to titrate insulin dosage based on glucose readings given to patient in writing).   - He would have benefited from continuous glucose monitoring, however he decided not to get it because he cannot afford the monthly co-pay.    - Adjustments for hypo-and hyperglycemia is given in written instructions to the patient.  -He is not a candidate for metformin, SGLT2 inhibitors, nor incretin in therapy due to history of liver transplant.  2) BP/HTN: -His blood pressure is controlled to target for his age.  He is advised to continue his current blood pressure medications including Benicar 40 mg p.o. daily.     3) Lipids/HPL:  His most recent lipid panel from 08/09/22 shows controlled with LDL at 63.  He is not on statins as a result of his history of liver failure status post liver transplant.     4)  Weight/Diet:  His Body mass index is 31.64 kg/m.    Carbs/calories restrictions and optimal exercise program discussed with him.  5) Primary hypothyroidism: His most recent thyroid labs done by PCP on 08/09/22 are consistent with appropriate hormone replacement.  He is advised to continue his Levothyroxine 125 mcg po daily before breakfast.    - We discussed about the correct intake of his thyroid hormone, on empty stomach at fasting, with water, separated by at least 30 minutes from breakfast and other medications,  and separated by more than 4 hours from calcium, iron, multivitamins, acid reflux medications (PPIs). -Patient is made aware of the fact that thyroid hormone replacement is needed for life, dose to be adjusted by periodic monitoring of thyroid function tests.  5) Chronic Care/Health Maintenance: -Patient is on ACEI/ARB  medications and encouraged to continue to follow up with Ophthalmology, Podiatrist at least yearly or according to recommendations, and advised to  stay away from smoking. I have recommended yearly flu vaccine and pneumonia vaccination at least every 5 years; and  sleep for at least 7 hours a day. -His foot exam is normal today.  - I advised patient to maintain close follow up with Assunta Found, MD for primary care needs.     I spent  30  minutes in the care of the patient today including review of labs from CMP, Lipids, Thyroid Function, Hematology (current and previous including abstractions from other facilities); face-to-face time discussing  his blood glucose readings/logs, discussing hypoglycemia and hyperglycemia episodes and symptoms, medications doses, his options of short and long term treatment based on the latest standards of care / guidelines;  discussion about incorporating lifestyle medicine;  and documenting the encounter. Risk reduction counseling performed per USPSTF guidelines to reduce obesity and cardiovascular risk factors.     Please  refer to Patient Instructions for Blood Glucose Monitoring and  Insulin/Medications Dosing Guide"  in media tab for additional information. Please  also refer to " Patient Self Inventory" in the Media  tab for reviewed elements of pertinent patient history.  [name] participated in the discussions, expressed understanding, and voiced agreement with the above plans.  All questions were answered to his satisfaction. he is encouraged to contact clinic should he have any questions or concerns prior to his return visit.    follow up plan: -Return in about 4 months (around 03/07/2023) for Diabetes F/U with A1c in office, No previsit labs, Bring meter and logs.   Ronny Bacon, [facility] [address] [place], [place] [address] Phone: [phone] Fax: [phone]  11/05/2022, 9:55 AM

## 2022-11-08 DIAGNOSIS — Z08 Encounter for follow-up examination after completed treatment for malignant neoplasm: Secondary | ICD-10-CM | POA: Diagnosis not present

## 2022-11-08 DIAGNOSIS — Z85828 Personal history of other malignant neoplasm of skin: Secondary | ICD-10-CM | POA: Diagnosis not present

## 2022-11-08 DIAGNOSIS — L281 Prurigo nodularis: Secondary | ICD-10-CM | POA: Diagnosis not present

## 2023-01-16 ENCOUNTER — Encounter: Payer: Self-pay | Admitting: *Deleted

## 2023-01-16 ENCOUNTER — Ambulatory Visit: Payer: Self-pay | Admitting: *Deleted

## 2023-01-16 NOTE — Patient Outreach (Signed)
  Care Coordination   Follow Up Visit Note   01/16/2023 Name: [name] MRN: [id number] DOB: 04/16/1941  [name] is a 82 y.o. year old male who sees Kyle Found, MD for primary care. I spoke with  [name] by phone today.  What matters to the patients health and wellness today?  Managing blood sugar    Goals Addressed             This Visit's Progress    COMPLETED: Care Coordination Services   On track    Care Coordination Goals: Patient will follow-up with PCP and/or specialist(s) as recommended Patient will take medications as prescribed Patient will remain physically active  Patient will reach out to RN Care Coordinator [phone] with any care coordination or resource needs Patient will utilize the [facility] 24 Hour Nurse/Concierge Line as needed [phone]       Manage Diabetes   On track    Care Coordination Goals: Patient will follow-up with endocrinologist every 3 months or as recommended Patient will take medication as prescribed and reach out to provider with any negative side effects Patient will continue to monitor and record blood sugar 4 times per day and as needed with glucometer, and will call endocrinologist with any readings outside of recommended range Patient will take blood sugar log and meter to provider visits for review Patient will reach out to RN Care Coordinator [phone] with any care coordination or resource needs         SDOH assessments and interventions completed:  Yes  SDOH Interventions Today    Flowsheet Row Most Recent Value  SDOH Interventions   Transportation Interventions Intervention Not Indicated  Financial Strain Interventions Intervention Not Indicated        Care Coordination Interventions:  Yes, provided  Interventions Today    Flowsheet Row Most Recent Value  Chronic Disease   Chronic disease during today's visit Diabetes  General Interventions   General Interventions Discussed/Reviewed  General Interventions Discussed, General Interventions Reviewed, Labs, Annual Foot Exam, Annual Eye Exam, Durable Medical Equipment (DME), Doctor Visits  [Blood Sugar 83 this morning. None <70. A few readings >200 at night. None on consecutive days.]  Labs Hgb A1c every 3 months  Doctor Visits Discussed/Reviewed Doctor Visits Reviewed  [7/29 Endo f/u]  Durable Medical Equipment (DME) Glucomoter  Exercise Interventions   Exercise Discussed/Reviewed Physical Activity  Physical Activity Discussed/Reviewed Physical Activity Discussed, Physical Activity Reviewed  Education Interventions   Education Provided Provided Education  Provided Verbal Education On Nutrition, Mental Health/Coping with Illness, When to see the doctor, Blood Sugar Monitoring, Exercise, Medication, Labs  Labs Reviewed Hgb A1c  [6.8 A1C on 11/05/22]  Pharmacy Interventions   Pharmacy Dicussed/Reviewed Pharmacy Topics Discussed, Pharmacy Topics Reviewed, Medications and their functions  Safety Interventions   Safety Discussed/Reviewed Safety Discussed, Safety Reviewed, Fall Risk, Home Safety       Follow up plan: No further intervention required.   Encounter Outcome:  Pt. Visit Completed   Demetrios Loll, BSN, RN-BC RN Care Coordinator [facility]  [facility] Direct Dial: [phone] Main #: [phone]

## 2023-02-04 ENCOUNTER — Encounter: Payer: Self-pay | Admitting: Nurse Practitioner

## 2023-02-04 ENCOUNTER — Ambulatory Visit: Payer: Medicare HMO | Admitting: Nurse Practitioner

## 2023-02-04 VITALS — BP 144/82 | HR 75 | Ht 69.5 in | Wt 215.8 lb

## 2023-02-04 DIAGNOSIS — E1122 Type 2 diabetes mellitus with diabetic chronic kidney disease: Secondary | ICD-10-CM

## 2023-02-04 DIAGNOSIS — E559 Vitamin D deficiency, unspecified: Secondary | ICD-10-CM | POA: Diagnosis not present

## 2023-02-04 DIAGNOSIS — Z794 Long term (current) use of insulin: Secondary | ICD-10-CM | POA: Diagnosis not present

## 2023-02-04 DIAGNOSIS — E039 Hypothyroidism, unspecified: Secondary | ICD-10-CM | POA: Diagnosis not present

## 2023-02-04 DIAGNOSIS — E782 Mixed hyperlipidemia: Secondary | ICD-10-CM | POA: Diagnosis not present

## 2023-02-04 DIAGNOSIS — I10 Essential (primary) hypertension: Secondary | ICD-10-CM | POA: Diagnosis not present

## 2023-02-04 DIAGNOSIS — N1831 Chronic kidney disease, stage 3a: Secondary | ICD-10-CM | POA: Diagnosis not present

## 2023-02-04 LAB — POCT GLYCOSYLATED HEMOGLOBIN (HGB A1C): Hemoglobin A1C: 6.2 % — AB (ref 4.0–5.6)

## 2023-02-04 NOTE — Progress Notes (Signed)
02/04/2023              Endocrinology follow-up note   Subjective:    Patient ID: [name], male    DOB: 02/24/1941, PCP Assunta Found, MD   Past Medical History:  Diagnosis Date   Anemia    Biliary calculi, common bile duct    CRI (chronic renal insufficiency)    DM (diabetes mellitus) (HCC)    Type II   Elevated liver enzymes    Esophageal varices (HCC) 03/19/11   mrcp   History of ERCP 03/21/2011   Extrahepatic biliary obstruction secondary to occluded biliary stent and colon no cholelithiasis, status post sphincterotomy, stent removal and replacement and stone extraction.   HTN (hypertension)    Hypothyroidism    NASH (nonalcoholic steatohepatitis) 2006   liver transplant   Pancreatitis chronic 03/19/11   mrcp   S/P colonoscopy with polypectomy 2004   Dr Jena Gauss   S/P endoscopy 03/2004   Hx esophgaeal varices, pyloric channel ulcer prior to transplant   Squamous cell cancer of external ear    pinna   Stroke (HCC)    "mini-stroke, weakness of left leg".   Past Surgical History:  Procedure Laterality Date   BILE DUCT STENT PLACEMENT  03/21/11   Dr. Jena Gauss   BIOPSY  02/12/2020   Procedure: BIOPSY;  Surgeon: Dolores Frame, MD;  [facility];  Service: Gastroenterology;;   CATARACT EXTRACTION Bilateral    CHOLECYSTECTOMY  2006   COLONOSCOPY  08/12/2002   Dr. Jena Gauss- somewhat diffusely friable rectum and colon, polyp at 30 cm.   COLONOSCOPY N/A 09/08/2012   Procedure: COLONOSCOPY;  Surgeon: Corbin Ade, MD;  colonic diverticulosis. Repeat in 10 years.    ERCP  03/21/2011   Dr. Jena Gauss- ERCP with removal of pediatric feeding tube, endoscopic sphinecterotomy with balloon dilation of ampulla followed by stone extraction and stent placement   ESOPHAGOGASTRODUODENOSCOPY  03/20/2004   Dr. Jolyn Nap 2 esophageal varices o/w normal esophageal mucosa, mild changes of snake skin in the gastric mucosa consistent with portal gastropathy, multiple antral erosions   and  a single pyloric channel ulcer benign appearance o/w normal gastric mucosa, normal D1 and S2   ESOPHAGOGASTRODUODENOSCOPY (EGD) WITH PROPOFOL N/A 02/12/2020   Procedure: ESOPHAGOGASTRODUODENOSCOPY (EGD) WITH PROPOFOL;  Surgeon: Marguerita Merles, Reuel Boom, MD; Food impaction in the middle third of the esophagus s/p removal, three superficial esophageal ulcerations with no stigmata of recent bleeding, one cratered nonnecrotic esophageal ulcer with no stigmata of recent bleed in the mid esophagus s/p biopsied. Path consistet with ulcer.    ESOPHAGOGASTRODUODENOSCOPY (EGD) WITH PROPOFOL N/A 08/22/2020   Procedure: ESOPHAGOGASTRODUODENOSCOPY (EGD) WITH PROPOFOL;  Surgeon: Corbin Ade, MD;  [facility];  Service: Endoscopy;  Laterality: N/A;  7:30am   KNEE ARTHROSCOPY WITH EXCISION PLICA Left 11/21/2012   Procedure: KNEE ARTHROSCOPY WITH EXCISION PLICA;  Surgeon: Vickki Hearing, MD;  Location: [facility];  Service: Orthopedics;  Laterality: Left;   KNEE ARTHROSCOPY WITH MEDIAL MENISECTOMY Left 11/21/2012   Procedure: KNEE ARTHROSCOPY WITH MEDIAL AND LATERAL MENISECTOMY;  Surgeon: Vickki Hearing, MD;  Location: [facility];  Service: Orthopedics;  Laterality: Left;   LIVER TRANSPLANT  2006   UVA-NASH cirrhosis   MALONEY DILATION N/A 08/22/2020   Procedure: Elease Hashimoto DILATION;  Surgeon: Corbin Ade, MD;  [facility];  Service: Endoscopy;  Laterality: N/A;   MOUTH SURGERY  may 2012   MRCP  03/19/11   biliary ductal dilatation- stones. esophageal  varices   SPHINCTEROTOMY  03/21/2011   Procedure: SPHINCTEROTOMY;  Surgeon: Corbin Ade, MD;  Location: [facility];  Service: Gastroenterology;;   Social History   Socioeconomic History   Marital status: Married    Spouse name: Not on file   Number of children: 1   Years of education: 14   Highest education level: Not on file  Occupational History   Occupation: RETIRED    Employer: RETIRED  Tobacco Use   Smoking status: Never    Smokeless tobacco: Never  Vaping Use   Vaping status: Never Used  Substance and Sexual Activity   Alcohol use: No   Drug use: No   Sexual activity: Yes  Other Topics Concern   Not on file  Social History Narrative   Not on file   Social Determinants of Health   Financial Resource Strain: Low Risk  (01/16/2023)   Overall Financial Resource Strain (CARDIA)    Difficulty of Paying Living Expenses: Not hard at all  Food Insecurity: No Food Insecurity (10/17/2022)   Hunger Vital Sign    Worried About Running Out of Food in the Last Year: Never true    Ran Out of Food in the Last Year: Never true  Transportation Needs: No Transportation Needs (01/16/2023)   PRAPARE - Administrator, Civil Service (Medical): No    Lack of Transportation (Non-Medical): No  Physical Activity: Sufficiently Active (10/17/2022)   Exercise Vital Sign    Days of Exercise per Week: 7 days    Minutes of Exercise per Session: 60 min  Stress: Not on file  Social Connections: Socially Integrated (10/17/2022)   Social Connection and Isolation Panel [NHANES]    Frequency of Communication with Friends and Family: More than three times a week    Frequency of Social Gatherings with Friends and Family: More than three times a week    Attends Religious Services: More than 4 times per year    Active Member of [employer] or Organizations: Yes    Attends Banker Meetings: More than 4 times per year    Marital Status: Married   Outpatient Encounter Medications as of 02/04/2023  Medication Sig   aspirin 325 MG tablet Take 1 tablet (325 mg total) by mouth daily.   famotidine (PEPCID) 20 MG tablet Take 1 tablet (20 mg total) by mouth daily.   glucose blood (ONETOUCH ULTRA) test strip USE TO TEST BLOOD SUGAR FOUR TIMES DAILY.   insulin aspart (NOVOLOG FLEXPEN) 100 UNIT/ML FlexPen Inject 4-7 Units into the skin 3 (three) times daily with meals. Give per sliding scale three times a day   insulin glargine-yfgn  (SEMGLEE) 100 UNIT/ML Pen Inject 12 Units into the skin at bedtime.   levothyroxine (SYNTHROID) 125 MCG tablet Take 1 tablet (125 mcg total) by mouth daily before breakfast.   MAGNESIUM PO Take 133 mg by mouth 2 (two) times daily.   Multiple Vitamins-Minerals (MULTIVITAMIN WITH MINERALS) tablet Take 1 tablet by mouth daily.     olmesartan (BENICAR) 40 MG tablet Take 40 mg by mouth daily.    predniSONE (DELTASONE) 5 MG tablet Take 5 mg by mouth daily as needed (As needed for gout).   tacrolimus (PROGRAF) 0.5 MG capsule Take 0.5 mg by mouth 2 (two) times daily.    No facility-administered encounter medications on file as of 02/04/2023.   ALLERGIES: Allergies  Allergen Reactions   Contrast Media [Iodinated Contrast Media] Rash   VACCINATION STATUS: Immunization History  Administered Date(s)  Administered   Influenza Split 02/13/2011, 04/29/2012, 04/20/2013, 05/09/2013   Influenza, High Dose Seasonal PF 04/26/2015, 04/13/2017   Influenza-Unspecified 04/08/2013   Moderna Sars-Covid-2 Vaccination 07/30/2019, 09/04/2019   Pneumococcal Conjugate-13 07/29/2014   Pneumococcal Polysaccharide-23 03/17/2010, 04/16/2011    Diabetes He presents for his follow-up diabetic visit. He has type 2 diabetes mellitus. Onset time: He was diagnosed at approximate age of 62 years. His disease course has been improving. Pertinent negatives for hypoglycemia include no confusion, headaches, pallor, seizures or sweats. Associated symptoms include weight loss. Pertinent negatives for diabetes include no chest pain, no polydipsia, no polyphagia, no polyuria and no weakness. There are no hypoglycemic complications. (He had 1 episode of nocturnal hypoglycemia in the last month.) Symptoms are stable. Diabetic complications include a CVA and nephropathy. Risk factors for coronary artery disease include diabetes mellitus, dyslipidemia, hypertension, male sex, obesity, sedentary lifestyle and tobacco exposure. Current diabetic  treatment includes intensive insulin program. He is compliant with treatment most of the time. His weight is decreasing steadily. He is following a generally healthy diet. Meal planning includes avoidance of concentrated sweets. He rarely participates in exercise. His home blood glucose trend is decreasing steadily. His breakfast blood glucose range is generally 90-110 mg/dl. His lunch blood glucose range is generally 110-130 mg/dl. His dinner blood glucose range is generally 110-130 mg/dl. His bedtime blood glucose range is generally 140-180 mg/dl. (He presents today with his logs, no meter, showing stable at target glycemic profile overall.  His POCT A1c today is 6.2%, improving from last visit of 6.8%. He denies any hypoglycemia. ) An ACE inhibitor/angiotensin II receptor blocker is being taken. Eye exam is current.  Hyperlipidemia This is a chronic problem. The current episode started more than 1 year ago. The problem is controlled. Recent lipid tests were reviewed and are normal. Exacerbating diseases include hypothyroidism and obesity. Pertinent negatives include no chest pain, myalgias or shortness of breath. He is currently on no antihyperlipidemic treatment. Compliance problems include adherence to exercise.  Risk factors for coronary artery disease include dyslipidemia, diabetes mellitus, hypertension, male sex, obesity and a sedentary lifestyle.  Hypertension This is a chronic problem. The current episode started more than 1 year ago. The problem has been resolved since onset. The problem is controlled. Pertinent negatives include no chest pain, headaches, neck pain, shortness of breath or sweats. There are no associated agents to hypertension. Risk factors for coronary artery disease include dyslipidemia, family history, obesity, male gender and sedentary lifestyle. Past treatments include ACE inhibitors. The current treatment provides mild improvement. There are no compliance problems.  Hypertensive  end-organ damage includes CVA. He is status post liver transplant..     Review of systems  Constitutional: + steadily decreasing body weight,  current Body mass index is 31.41 kg/m. , no fatigue, no subjective hyperthermia, no subjective hypothermia Eyes: no blurry vision, no xerophthalmia ENT: no sore throat, no nodules palpated in throat, no dysphagia/odynophagia, no hoarseness Cardiovascular: no chest pain, no shortness of breath, no palpitations, no leg swelling Respiratory: no cough, no shortness of breath Gastrointestinal: no nausea/vomiting/diarrhea Musculoskeletal: no muscle/joint aches Skin: no rashes, no hyperemia Neurological: no tremors, no numbness, no tingling, no dizziness Psychiatric: no depression, no anxiety   Objective:    BP (!) 144/82 (BP Location: Right Arm, Patient Position: Sitting, Cuff Size: Large) Comment: Recheck manuel cuff  Pulse 75   Ht 5' 9.5" (1.765 m)   Wt 215 lb 12.8 oz (97.9 kg)   BMI 31.41 kg/m   Wt  Readings from Last 3 Encounters:  02/04/23 215 lb 12.8 oz (97.9 kg)  11/05/22 217 lb 6.4 oz (98.6 kg)  07/05/22 224 lb 6.4 oz (101.8 kg)     BP Readings from Last 3 Encounters:  02/04/23 (!) 144/82  11/05/22 138/88  07/05/22 (!) 145/79     Physical Exam- Limited  Constitutional:  Body mass index is 31.41 kg/m. , not in acute distress, normal state of mind Eyes:  EOMI, no exophthalmos Musculoskeletal: no gross deformities, strength intact in all four extremities, no gross restriction of joint movements Skin:  no rashes, no hyperemia Neurological: no tremor with outstretched hands   Diabetic Foot Exam - Simple   No data filed     Results for orders placed or performed in visit on 02/04/23  HgB A1c  Result Value Ref Range   Hemoglobin A1C 6.2 (A) 4.0 - 5.6 %   HbA1c POC (<> result, manual entry)     HbA1c, POC (prediabetic range)     HbA1c, POC (controlled diabetic range)     Diabetic Labs (most recent): Lab Results   Component Value Date   HGBA1C 6.2 (A) 02/04/2023   HGBA1C 6.8 (A) 11/05/2022   HGBA1C 6.6 (A) 07/05/2022   Lipid Panel     Component Value Date/Time   CHOL 113 02/06/2022 0806   TRIG 67 02/06/2022 0806   HDL 34 (L) 02/06/2022 0806   CHOLHDL 3.3 02/06/2022 0806   CHOLHDL 4.3 04/13/2017 0547   VLDL 21 04/13/2017 0547   LDLCALC 65 02/06/2022 0806     Assessment & Plan:   1) Controlled type 2 diabetes mellitus with complication, with CKD GFR of 49, CVA  -Recently he had developed CVA requiring rehabilitation and physical therapy. He remains at a high risk for more acute and chronic complications of diabetes which include CAD, CVA, CKD, retinopathy, and neuropathy. These are all discussed in detail with the patient.  He presents today with his logs, no meter, showing stable at target glycemic profile overall.  His POCT A1c today is 6.2%, improving from last visit of 6.8%. He denies any hypoglycemia.    Glucose logs and insulin administration records pertaining to this visit,  to be scanned into patient's records.  Recent labs reviewed.  - Nutritional counseling repeated at each appointment due to patients tendency to fall back in to old habits.  - The patient admits there is a room for improvement in their diet and drink choices. -  Suggestion is made for the patient to avoid simple carbohydrates from their diet including Cakes, Sweet Desserts / Pastries, Ice Cream, Soda (diet and regular), Sweet Tea, Candies, Chips, Cookies, Sweet Pastries, Store Bought Juices, Alcohol in Excess of 1-2 drinks a day, Artificial Sweeteners, Coffee Creamer, and "Sugar-free" Products. This will help patient to have stable blood glucose profile and potentially avoid unintended weight gain.   - I encouraged the patient to switch to unprocessed or minimally processed complex starch and increased protein intake (animal or plant source), fruits, and vegetables.   - Patient is advised to stick to a routine  mealtimes to eat 3 meals a day and avoid unnecessary snacks (to snack only to correct hypoglycemia).  - I have approached patient with the following individualized plan to manage diabetes and patient agrees.  -He will continue to require intensive treatment with basal/bolus insulin in order for him to maintain control of diabetes to target.   However, avoiding hypoglycemia is the number 1 priority in his care.  An  acceptable A1c for him would be around 7%.  Given his stable, at goal glycemic profile and lack of hypoglycemia, no changes will be made to his medication regimen today.  He is advised to continue his Semglee 12 units SQ nightly and continue Novolog 4-7 units TID with meals if glucose is above 90 and he is eating (Specific instructions on how to titrate insulin dosage based on glucose readings given to patient in writing).   - He would have benefited from continuous glucose monitoring, however he decided not to get it because he cannot afford the monthly co-pay.    - Adjustments for hypo-and hyperglycemia is given in written instructions to the patient.  -He is not a candidate for metformin, SGLT2 inhibitors, nor incretin in therapy due to history of liver transplant.  2) BP/HTN: -His blood pressure is controlled to target for his age.  He is advised to continue his current blood pressure medications including Benicar 40 mg p.o. daily.     3) Lipids/HPL:  His most recent lipid panel from 08/09/22 shows controlled with LDL at 63.  He is not on statins as a result of his history of liver failure status post liver transplant.    4)  Weight/Diet:  His Body mass index is 31.41 kg/m.    Carbs/calories restrictions and optimal exercise program discussed with him.  5) Primary hypothyroidism: There are no recent TFTs to review.  He is advised to continue his Levothyroxine 125 mcg po daily before breakfast.  Will recheck TFTs prior to next visit and adjust dose accordingly.   - We discussed  about the correct intake of his thyroid hormone, on empty stomach at fasting, with water, separated by at least 30 minutes from breakfast and other medications,  and separated by more than 4 hours from calcium, iron, multivitamins, acid reflux medications (PPIs). -Patient is made aware of the fact that thyroid hormone replacement is needed for life, dose to be adjusted by periodic monitoring of thyroid function tests.  5) Chronic Care/Health Maintenance: -Patient is on ACEI/ARB  medications and encouraged to continue to follow up with Ophthalmology, Podiatrist at least yearly or according to recommendations, and advised to  stay away from smoking. I have recommended yearly flu vaccine and pneumonia vaccination at least every 5 years; and  sleep for at least 7 hours a day. -His foot exam is normal today.  - I advised patient to maintain close follow up with Assunta Found, MD for primary care needs.      I spent  32  minutes in the care of the patient today including review of labs from CMP, Lipids, Thyroid Function, Hematology (current and previous including abstractions from other facilities); face-to-face time discussing  his blood glucose readings/logs, discussing hypoglycemia and hyperglycemia episodes and symptoms, medications doses, his options of short and long term treatment based on the latest standards of care / guidelines;  discussion about incorporating lifestyle medicine;  and documenting the encounter. Risk reduction counseling performed per USPSTF guidelines to reduce obesity and cardiovascular risk factors.     Please refer to Patient Instructions for Blood Glucose Monitoring and Insulin/Medications Dosing Guide"  in media tab for additional information. Please  also refer to " Patient Self Inventory" in the Media  tab for reviewed elements of pertinent patient history.  [name] participated in the discussions, expressed understanding, and voiced agreement with the above plans.   All questions were answered to his satisfaction. he is encouraged to contact clinic should  he have any questions or concerns prior to his return visit.    follow up plan: -Return in about 4 months (around 06/07/2023) for Diabetes F/U with A1c in office, Thyroid follow up, Previsit labs, Bring meter and logs.   Ronny Bacon, [facility] [address] [place], [place] [address] Phone: [phone] Fax: [phone]  02/04/2023, 8:23 AM

## 2023-02-04 NOTE — Patient Instructions (Signed)

## 2023-02-07 DIAGNOSIS — E039 Hypothyroidism, unspecified: Secondary | ICD-10-CM | POA: Diagnosis not present

## 2023-02-07 DIAGNOSIS — Z48298 Encounter for aftercare following other organ transplant: Secondary | ICD-10-CM | POA: Diagnosis not present

## 2023-02-07 DIAGNOSIS — Z7960 Long term (current) use of unspecified immunomodulators and immunosuppressants: Secondary | ICD-10-CM | POA: Diagnosis not present

## 2023-02-07 DIAGNOSIS — Z944 Liver transplant status: Secondary | ICD-10-CM | POA: Diagnosis not present

## 2023-02-12 DIAGNOSIS — E039 Hypothyroidism, unspecified: Secondary | ICD-10-CM | POA: Diagnosis not present

## 2023-02-12 DIAGNOSIS — Z8249 Family history of ischemic heart disease and other diseases of the circulatory system: Secondary | ICD-10-CM | POA: Diagnosis not present

## 2023-02-12 DIAGNOSIS — E785 Hyperlipidemia, unspecified: Secondary | ICD-10-CM | POA: Diagnosis not present

## 2023-02-12 DIAGNOSIS — K5900 Constipation, unspecified: Secondary | ICD-10-CM | POA: Diagnosis not present

## 2023-02-12 DIAGNOSIS — Z794 Long term (current) use of insulin: Secondary | ICD-10-CM | POA: Diagnosis not present

## 2023-02-12 DIAGNOSIS — Z809 Family history of malignant neoplasm, unspecified: Secondary | ICD-10-CM | POA: Diagnosis not present

## 2023-02-12 DIAGNOSIS — Z85828 Personal history of other malignant neoplasm of skin: Secondary | ICD-10-CM | POA: Diagnosis not present

## 2023-02-12 DIAGNOSIS — K219 Gastro-esophageal reflux disease without esophagitis: Secondary | ICD-10-CM | POA: Diagnosis not present

## 2023-02-12 DIAGNOSIS — Z008 Encounter for other general examination: Secondary | ICD-10-CM | POA: Diagnosis not present

## 2023-02-12 DIAGNOSIS — N189 Chronic kidney disease, unspecified: Secondary | ICD-10-CM | POA: Diagnosis not present

## 2023-02-12 DIAGNOSIS — E1122 Type 2 diabetes mellitus with diabetic chronic kidney disease: Secondary | ICD-10-CM | POA: Diagnosis not present

## 2023-02-12 DIAGNOSIS — E669 Obesity, unspecified: Secondary | ICD-10-CM | POA: Diagnosis not present

## 2023-02-12 DIAGNOSIS — I129 Hypertensive chronic kidney disease with stage 1 through stage 4 chronic kidney disease, or unspecified chronic kidney disease: Secondary | ICD-10-CM | POA: Diagnosis not present

## 2023-02-22 ENCOUNTER — Telehealth: Payer: Self-pay | Admitting: *Deleted

## 2023-02-22 NOTE — Telephone Encounter (Signed)
Have him lower his Semglee to 8 units for now and see if it prevents the 70-80s in the am.

## 2023-02-22 NOTE — Telephone Encounter (Signed)
Patient was called and made aware. 

## 2023-02-22 NOTE — Telephone Encounter (Signed)
He shared that he was instructed when his readings three days in a row were under 90 , to call the office, they have been this.  Pt called with high BG readings.   Date Before breakfast Before lunch Before supper Bedtime  02/19/23    194  02/20/23 89   200  02/21/2023 71   160  02/22/2023 78       Pt taking: Patient states that he is injecting 4 units of the Novolog daily, unless BG is over 150 and he will inject 5 units. He injects Semglee 12 units at bedtime.

## 2023-03-10 ENCOUNTER — Encounter (HOSPITAL_COMMUNITY): Payer: Self-pay

## 2023-03-10 ENCOUNTER — Emergency Department (HOSPITAL_COMMUNITY)
Admission: EM | Admit: 2023-03-10 | Discharge: 2023-03-10 | Disposition: A | Discharge status: Home / Self Care | Payer: Medicare HMO | Attending: Emergency Medicine | Admitting: Emergency Medicine

## 2023-03-10 ENCOUNTER — Emergency Department (HOSPITAL_COMMUNITY): Payer: Medicare HMO

## 2023-03-10 ENCOUNTER — Other Ambulatory Visit: Payer: Self-pay

## 2023-03-10 DIAGNOSIS — M7981 Nontraumatic hematoma of soft tissue: Secondary | ICD-10-CM | POA: Diagnosis not present

## 2023-03-10 DIAGNOSIS — M79661 Pain in right lower leg: Secondary | ICD-10-CM | POA: Diagnosis not present

## 2023-03-10 DIAGNOSIS — R600 Localized edema: Secondary | ICD-10-CM | POA: Diagnosis not present

## 2023-03-10 DIAGNOSIS — M1711 Unilateral primary osteoarthritis, right knee: Secondary | ICD-10-CM | POA: Diagnosis not present

## 2023-03-10 DIAGNOSIS — M79604 Pain in right leg: Secondary | ICD-10-CM

## 2023-03-10 DIAGNOSIS — E119 Type 2 diabetes mellitus without complications: Secondary | ICD-10-CM | POA: Diagnosis not present

## 2023-03-10 DIAGNOSIS — Z7982 Long term (current) use of aspirin: Secondary | ICD-10-CM | POA: Insufficient documentation

## 2023-03-10 DIAGNOSIS — I10 Essential (primary) hypertension: Secondary | ICD-10-CM | POA: Diagnosis not present

## 2023-03-10 DIAGNOSIS — Z794 Long term (current) use of insulin: Secondary | ICD-10-CM | POA: Insufficient documentation

## 2023-03-10 NOTE — ED Provider Notes (Signed)
[place] EMERGENCY DEPARTMENT AT [facility] Provider Note   CSN: [id number] Arrival date & time: 03/10/23  4098     History {Add pertinent medical, surgical, social history, OB history to HPI:1} Chief Complaint  Patient presents with   Leg Pain    [name] is a 82 y.o. male.  Patient has a history of hypertension and diabetes.  He complains of discomfort to his right lower leg and he has some bruising.   Leg Pain      Home Medications Prior to Admission medications   Medication Sig Start Date End Date Taking? Authorizing Provider  aspirin 325 MG tablet Take 1 tablet (325 mg total) by mouth daily. 04/16/17   Meredeth Ide, MD  famotidine (PEPCID) 20 MG tablet Take 1 tablet (20 mg total) by mouth daily. 07/13/22   Rourk, Gerrit Friends, MD  glucose blood (ONETOUCH ULTRA) test strip USE TO TEST BLOOD SUGAR FOUR TIMES DAILY. 07/19/22   Dani Gobble, NP  insulin aspart (NOVOLOG FLEXPEN) 100 UNIT/ML FlexPen Inject 4-7 Units into the skin 3 (three) times daily with meals. Give per sliding scale three times a day 07/05/22   Dani Gobble, NP  insulin glargine-yfgn (SEMGLEE) 100 UNIT/ML Pen Inject 12 Units into the skin at bedtime. 07/05/22   Dani Gobble, NP  levothyroxine (SYNTHROID) 125 MCG tablet Take 1 tablet (125 mcg total) by mouth daily before breakfast. 11/05/22   Dani Gobble, NP  MAGNESIUM PO Take 133 mg by mouth 2 (two) times daily.    [provider]  Multiple Vitamins-Minerals (MULTIVITAMIN WITH MINERALS) tablet Take 1 tablet by mouth daily.      [provider]  olmesartan (BENICAR) 40 MG tablet Take 40 mg by mouth daily.  10/23/19   [provider]  predniSONE (DELTASONE) 5 MG tablet Take 5 mg by mouth daily as needed (As needed for gout).    [provider]  tacrolimus (PROGRAF) 0.5 MG capsule Take 0.5 mg by mouth 2 (two) times daily.     [provider]      Allergies    Contrast media  [iodinated contrast media]    Review of Systems   Review of Systems  Physical Exam Updated Vital Signs BP (!) 174/89   Pulse 63   Temp 98.1 F (36.7 C) (Oral)   Resp 18   Ht 5\' 9"  (1.753 m)   Wt 97.1 kg   SpO2 100%   BMI 31.60 kg/m  Physical Exam  ED Results / Procedures / Treatments   Labs (all labs ordered are listed, but only abnormal results are displayed) Labs Reviewed - No data to display  EKG None  Radiology DG Tibia/Fibula Right  Result Date: 03/10/2023 CLINICAL DATA:  Pain. EXAM: RIGHT TIBIA AND FIBULA - 2 VIEW COMPARISON:  None Available. FINDINGS: No signs of acute fracture or dislocation. No focal bone erosions identified. Degenerative changes noted within the right knee. Vascular calcifications and mild diffuse subcutaneous edema. IMPRESSION: 1. No acute findings. 2. Right knee osteoarthritis. Electronically Signed   By: Signa Kell M.D.   On: 03/10/2023 08:50    Procedures Procedures  {Document cardiac monitor, telemetry assessment procedure when appropriate:1}  Medications Ordered in ED Medications - No data to display  ED Course/ Medical Decision Making/ A&P   {   Click here for ABCD2, HEART and other calculatorsREFRESH Note before signing :1}  Medical Decision Making Amount and/or Complexity of Data Reviewed Radiology: ordered.   Contusion to the right lower leg.  Patient will follow-up with his PCP as needed  {Document critical care time when appropriate:1} {Document review of labs and clinical decision tools ie heart score, Chads2Vasc2 etc:1}  {Document your independent review of radiology images, and any outside records:1} {Document your discussion with family members, caretakers, and with consultants:1} {Document social determinants of health affecting pt's care:1} {Document your decision making why or why not admission, treatments were needed:1} Final Clinical Impression(s) / ED Diagnoses Final diagnoses:   Right leg pain    Rx / DC Orders ED Discharge Orders     None

## 2023-03-10 NOTE — ED Triage Notes (Signed)
Pt reports waking up yesterday morning with his legs crossed and having pain the right medial aspect of his shin just below the knee.  Pt has a large faint yellow bruise to this area but does not recall any injury.

## 2023-03-10 NOTE — Discharge Instructions (Signed)
Continue taking your medicines and follow-up with your family doctor this week if any problems

## 2023-03-20 DIAGNOSIS — E6609 Other obesity due to excess calories: Secondary | ICD-10-CM | POA: Diagnosis not present

## 2023-03-20 DIAGNOSIS — M1711 Unilateral primary osteoarthritis, right knee: Secondary | ICD-10-CM | POA: Diagnosis not present

## 2023-03-20 DIAGNOSIS — Z6830 Body mass index (BMI) 30.0-30.9, adult: Secondary | ICD-10-CM | POA: Diagnosis not present

## 2023-03-20 DIAGNOSIS — Z23 Encounter for immunization: Secondary | ICD-10-CM | POA: Diagnosis not present

## 2023-04-03 ENCOUNTER — Other Ambulatory Visit: Payer: Self-pay | Admitting: Nurse Practitioner

## 2023-05-01 ENCOUNTER — Other Ambulatory Visit: Payer: Self-pay

## 2023-05-01 ENCOUNTER — Other Ambulatory Visit: Payer: Self-pay | Admitting: Nurse Practitioner

## 2023-05-01 ENCOUNTER — Telehealth: Payer: Self-pay | Admitting: Nurse Practitioner

## 2023-05-01 MED ORDER — ONETOUCH ULTRA VI STRP: ORAL_STRIP | 3 refills | Status: DC

## 2023-05-01 NOTE — Telephone Encounter (Signed)
Opened to send refill request.  Refill request had already been completed so phone note was not needed

## 2023-05-09 DIAGNOSIS — X32XXXD Exposure to sunlight, subsequent encounter: Secondary | ICD-10-CM | POA: Diagnosis not present

## 2023-05-09 DIAGNOSIS — L570 Actinic keratosis: Secondary | ICD-10-CM | POA: Diagnosis not present

## 2023-05-09 DIAGNOSIS — L308 Other specified dermatitis: Secondary | ICD-10-CM | POA: Diagnosis not present

## 2023-05-09 DIAGNOSIS — D0422 Carcinoma in situ of skin of left ear and external auricular canal: Secondary | ICD-10-CM | POA: Diagnosis not present

## 2023-05-09 DIAGNOSIS — D225 Melanocytic nevi of trunk: Secondary | ICD-10-CM | POA: Diagnosis not present

## 2023-05-09 DIAGNOSIS — C44319 Basal cell carcinoma of skin of other parts of face: Secondary | ICD-10-CM | POA: Diagnosis not present

## 2023-05-10 DIAGNOSIS — Z944 Liver transplant status: Secondary | ICD-10-CM | POA: Diagnosis not present

## 2023-05-10 DIAGNOSIS — Z48298 Encounter for aftercare following other organ transplant: Secondary | ICD-10-CM | POA: Diagnosis not present

## 2023-05-10 DIAGNOSIS — Z7960 Long term (current) use of unspecified immunomodulators and immunosuppressants: Secondary | ICD-10-CM | POA: Diagnosis not present

## 2023-06-10 ENCOUNTER — Encounter: Payer: Self-pay | Admitting: Nurse Practitioner

## 2023-06-10 ENCOUNTER — Other Ambulatory Visit: Payer: Self-pay | Admitting: *Deleted

## 2023-06-10 ENCOUNTER — Ambulatory Visit: Payer: Medicare HMO | Admitting: Nurse Practitioner

## 2023-06-10 VITALS — BP 140/82 | HR 75 | Ht 69.5 in | Wt 208.2 lb

## 2023-06-10 DIAGNOSIS — E559 Vitamin D deficiency, unspecified: Secondary | ICD-10-CM | POA: Diagnosis not present

## 2023-06-10 DIAGNOSIS — E1122 Type 2 diabetes mellitus with diabetic chronic kidney disease: Secondary | ICD-10-CM

## 2023-06-10 DIAGNOSIS — N1831 Chronic kidney disease, stage 3a: Secondary | ICD-10-CM | POA: Diagnosis not present

## 2023-06-10 DIAGNOSIS — E782 Mixed hyperlipidemia: Secondary | ICD-10-CM | POA: Diagnosis not present

## 2023-06-10 DIAGNOSIS — E039 Hypothyroidism, unspecified: Secondary | ICD-10-CM | POA: Diagnosis not present

## 2023-06-10 DIAGNOSIS — I10 Essential (primary) hypertension: Secondary | ICD-10-CM | POA: Diagnosis not present

## 2023-06-10 DIAGNOSIS — Z794 Long term (current) use of insulin: Secondary | ICD-10-CM | POA: Diagnosis not present

## 2023-06-10 LAB — HEMOGLOBIN A1C: Hemoglobin A1C: 6.4

## 2023-06-10 MED ORDER — LEVOTHYROXINE SODIUM 125 MCG PO TABS: 125.0000 ug | ORAL_TABLET | Freq: Every day | ORAL | 3 refills | Status: DC

## 2023-06-10 NOTE — Progress Notes (Signed)
06/10/2023              Endocrinology follow-up note   Subjective:    Patient ID: [name], male    DOB: [birth year], PCP Assunta Found, MD   Past Medical History:  Diagnosis Date   Anemia    Biliary calculi, common bile duct    CRI (chronic renal insufficiency)    DM (diabetes mellitus) (HCC)    Type II   Elevated liver enzymes    Esophageal varices (HCC) 03/19/11   mrcp   History of ERCP 03/21/2011   Extrahepatic biliary obstruction secondary to occluded biliary stent and colon no cholelithiasis, status post sphincterotomy, stent removal and replacement and stone extraction.   HTN (hypertension)    Hypothyroidism    NASH (nonalcoholic steatohepatitis) 2006   liver transplant   Pancreatitis chronic 03/19/11   mrcp   S/P colonoscopy with polypectomy 2004   Dr Jena Gauss   S/P endoscopy 03/2004   Hx esophgaeal varices, pyloric channel ulcer prior to transplant   Squamous cell cancer of external ear    pinna   Stroke (HCC)    "mini-stroke, weakness of left leg".   Past Surgical History:  Procedure Laterality Date   BILE DUCT STENT PLACEMENT  03/21/11   Dr. Jena Gauss   BIOPSY  02/12/2020   Procedure: BIOPSY;  Surgeon: Dolores Frame, MD;  Location: [facility];  Service: Gastroenterology;;   CATARACT EXTRACTION Bilateral    CHOLECYSTECTOMY  2006   COLONOSCOPY  08/12/2002   Dr. Jena Gauss- somewhat diffusely friable rectum and colon, polyp at 30 cm.   COLONOSCOPY N/A 09/08/2012   Procedure: COLONOSCOPY;  Surgeon: Corbin Ade, MD;  colonic diverticulosis. Repeat in 10 years.    ERCP  03/21/2011   Dr. Jena Gauss- ERCP with removal of pediatric feeding tube, endoscopic sphinecterotomy with balloon dilation of ampulla followed by stone extraction and stent placement   ESOPHAGOGASTRODUODENOSCOPY  03/20/2004   Dr. Jolyn Nap 2 esophageal varices o/w normal esophageal mucosa, mild changes of snake skin in the gastric mucosa consistent with portal gastropathy, multiple antral erosions   and  a single pyloric channel ulcer benign appearance o/w normal gastric mucosa, normal D1 and S2   ESOPHAGOGASTRODUODENOSCOPY (EGD) WITH PROPOFOL N/A 02/12/2020   Procedure: ESOPHAGOGASTRODUODENOSCOPY (EGD) WITH PROPOFOL;  Surgeon: Marguerita Merles, Reuel Boom, MD; Food impaction in the middle third of the esophagus s/p removal, three superficial esophageal ulcerations with no stigmata of recent bleeding, one cratered nonnecrotic esophageal ulcer with no stigmata of recent bleed in the mid esophagus s/p biopsied. Path consistet with ulcer.    ESOPHAGOGASTRODUODENOSCOPY (EGD) WITH PROPOFOL N/A 08/22/2020   Procedure: ESOPHAGOGASTRODUODENOSCOPY (EGD) WITH PROPOFOL;  Surgeon: Corbin Ade, MD;  Location: [facility];  Service: Endoscopy;  Laterality: N/A;  7:30am   KNEE ARTHROSCOPY WITH EXCISION PLICA Left 11/21/2012   Procedure: KNEE ARTHROSCOPY WITH EXCISION PLICA;  Surgeon: Vickki Hearing, MD;  Location: [facility];  Service: Orthopedics;  Laterality: Left;   KNEE ARTHROSCOPY WITH MEDIAL MENISECTOMY Left 11/21/2012   Procedure: KNEE ARTHROSCOPY WITH MEDIAL AND LATERAL MENISECTOMY;  Surgeon: Vickki Hearing, MD;  Location: [facility];  Service: Orthopedics;  Laterality: Left;   LIVER TRANSPLANT  2006   UVA-NASH cirrhosis   MALONEY DILATION N/A 08/22/2020   Procedure: Elease Hashimoto DILATION;  Surgeon: Corbin Ade, MD;  Location: [facility];  Service: Endoscopy;  Laterality: N/A;   MOUTH SURGERY  may 2012   MRCP  03/19/11   biliary ductal dilatation- stones. esophageal  varices   SPHINCTEROTOMY  03/21/2011   Procedure: SPHINCTEROTOMY;  Surgeon: Corbin Ade, MD;  Location: [facility];  Service: Gastroenterology;;   Social History   Socioeconomic History   Marital status: Married    Spouse name: Not on file   Number of children: 1   Years of education: 14   Highest education level: Not on file  Occupational History   Occupation: RETIRED    Employer: RETIRED  Tobacco Use   Smoking status: Never    Smokeless tobacco: Never  Vaping Use   Vaping status: Never Used  Substance and Sexual Activity   Alcohol use: No   Drug use: No   Sexual activity: Yes  Other Topics Concern   Not on file  Social History Narrative   Not on file   Social Determinants of Health   Financial Resource Strain: Low Risk  (01/16/2023)   Overall Financial Resource Strain (CARDIA)    Difficulty of Paying Living Expenses: Not hard at all  Food Insecurity: No Food Insecurity (10/17/2022)   Hunger Vital Sign    Worried About Running Out of Food in the Last Year: Never true    Ran Out of Food in the Last Year: Never true  Transportation Needs: No Transportation Needs (01/16/2023)   PRAPARE - Administrator, Civil Service (Medical): No    Lack of Transportation (Non-Medical): No  Physical Activity: Sufficiently Active (10/17/2022)   Exercise Vital Sign    Days of Exercise per Week: 7 days    Minutes of Exercise per Session: 60 min  Stress: Not on file  Social Connections: Socially Integrated (10/17/2022)   Social Connection and Isolation Panel [NHANES]    Frequency of Communication with Friends and Family: More than three times a week    Frequency of Social Gatherings with Friends and Family: More than three times a week    Attends Religious Services: More than 4 times per year    Active Member of [employer] or Organizations: Yes    Attends Banker Meetings: More than 4 times per year    Marital Status: Married   Outpatient Encounter Medications as of 06/10/2023  Medication Sig   aspirin 325 MG tablet Take 1 tablet (325 mg total) by mouth daily.   famotidine (PEPCID) 20 MG tablet Take 1 tablet (20 mg total) by mouth daily.   glucose blood (ONETOUCH ULTRA) test strip USE TO TEST BLOOD SUGAR FOUR TIMES DAILY.   insulin aspart (NOVOLOG FLEXPEN) 100 UNIT/ML FlexPen Inject 4-7 Units into the skin 3 (three) times daily with meals. Give per sliding scale three times a day   insulin glargine-yfgn  (SEMGLEE) 100 UNIT/ML Pen Inject 12 Units into the skin at bedtime.   MAGNESIUM PO Take 133 mg by mouth 2 (two) times daily.   Multiple Vitamins-Minerals (MULTIVITAMIN WITH MINERALS) tablet Take 1 tablet by mouth daily.     olmesartan (BENICAR) 40 MG tablet Take 40 mg by mouth daily.    predniSONE (DELTASONE) 5 MG tablet Take 5 mg by mouth daily as needed (As needed for gout).   tacrolimus (PROGRAF) 0.5 MG capsule Take 0.5 mg by mouth 2 (two) times daily.    [DISCONTINUED] levothyroxine (SYNTHROID) 125 MCG tablet TAKE 1 TABLET BY MOUTH DAILY BEFORE BREAKFAST.   levothyroxine (SYNTHROID) 125 MCG tablet Take 1 tablet (125 mcg total) by mouth daily before breakfast.   No facility-administered encounter medications on file as of 06/10/2023.   ALLERGIES: Allergies  Allergen Reactions  Contrast Media [Iodinated Contrast Media] Rash   VACCINATION STATUS: Immunization History  Administered Date(s) Administered   Influenza Split 02/13/2011, 04/29/2012, 04/20/2013, 05/09/2013   Influenza, High Dose Seasonal PF 04/26/2015, 04/13/2017   Influenza-Unspecified 04/08/2013   Moderna Sars-Covid-2 Vaccination 07/30/2019, 09/04/2019   Pneumococcal Conjugate-13 07/29/2014   Pneumococcal Polysaccharide-23 03/17/2010, 04/16/2011    Diabetes He presents for his follow-up diabetic visit. He has type 2 diabetes mellitus. Onset time: He was diagnosed at approximate age of 32 years. His disease course has been stable. Pertinent negatives for hypoglycemia include no confusion, headaches, pallor, seizures or sweats. Associated symptoms include weight loss. Pertinent negatives for diabetes include no chest pain, no polydipsia, no polyphagia, no polyuria and no weakness. There are no hypoglycemic complications. (He had 1 episode of nocturnal hypoglycemia in the last month.) Symptoms are stable. Diabetic complications include a CVA and nephropathy. Risk factors for coronary artery disease include diabetes mellitus,  dyslipidemia, hypertension, male sex, obesity, sedentary lifestyle and tobacco exposure. Current diabetic treatment includes intensive insulin program. He is compliant with treatment most of the time. His weight is decreasing steadily. He is following a generally healthy diet. Meal planning includes avoidance of concentrated sweets. He rarely participates in exercise. His home blood glucose trend is fluctuating minimally. His breakfast blood glucose range is generally 90-110 mg/dl. His lunch blood glucose range is generally 110-130 mg/dl. His dinner blood glucose range is generally 110-130 mg/dl. His bedtime blood glucose range is generally 140-180 mg/dl. (He presents today with his logs, no meter, showing stable at target glycemic profile overall.  His POCT A1c today is 6.4%, increasing slightly from last visit of 6.2%. He denies any hypoglycemia.   He did get steroid injection in right knee between visits.) An ACE inhibitor/angiotensin II receptor blocker is being taken. Eye exam is current.  Hyperlipidemia This is a chronic problem. The current episode started more than 1 year ago. The problem is controlled. Recent lipid tests were reviewed and are normal. Exacerbating diseases include hypothyroidism and obesity. Pertinent negatives include no chest pain, myalgias or shortness of breath. He is currently on no antihyperlipidemic treatment. Compliance problems include adherence to exercise.  Risk factors for coronary artery disease include dyslipidemia, diabetes mellitus, hypertension, male sex, obesity and a sedentary lifestyle.  Hypertension This is a chronic problem. The current episode started more than 1 year ago. The problem has been resolved since onset. The problem is controlled. Pertinent negatives include no chest pain, headaches, neck pain, shortness of breath or sweats. There are no associated agents to hypertension. Risk factors for coronary artery disease include dyslipidemia, family history,  obesity, male gender and sedentary lifestyle. Past treatments include ACE inhibitors. The current treatment provides mild improvement. There are no compliance problems.  Hypertensive end-organ damage includes CVA. He is status post liver transplant..     Review of systems  Constitutional: + steadily decreasing body weight,  current Body mass index is 30.3 kg/m. , no fatigue, no subjective hyperthermia, no subjective hypothermia Eyes: no blurry vision, no xerophthalmia ENT: no sore throat, no nodules palpated in throat, no dysphagia/odynophagia, no hoarseness Cardiovascular: no chest pain, no shortness of breath, no palpitations, no leg swelling Respiratory: no cough, no shortness of breath Gastrointestinal: no nausea/vomiting/diarrhea Musculoskeletal: no muscle/joint aches Skin: no rashes, no hyperemia Neurological: no tremors, no numbness, no tingling, no dizziness Psychiatric: no depression, no anxiety   Objective:    BP (!) 140/82 (BP Location: Right Arm, Patient Position: Sitting, Cuff Size: Large) Comment: etake manuel cuff.Patient  has taken his BP meds. Fernando Stoiber made aware  Pulse 75   Ht 5' 9.5" (1.765 m)   Wt 208 lb 3.2 oz (94.4 kg)   BMI 30.30 kg/m   Wt Readings from Last 3 Encounters:  06/10/23 208 lb 3.2 oz (94.4 kg)  03/10/23 214 lb (97.1 kg)  02/04/23 215 lb 12.8 oz (97.9 kg)     BP Readings from Last 3 Encounters:  06/10/23 (!) 140/82  03/10/23 (!) 170/86  02/04/23 (!) 144/82     Physical Exam- Limited  Constitutional:  Body mass index is 30.3 kg/m. , not in acute distress, normal state of mind Eyes:  EOMI, no exophthalmos Musculoskeletal: no gross deformities, strength intact in all four extremities, no gross restriction of joint movements Skin:  no rashes, no hyperemia Neurological: no tremor with outstretched hands   Diabetic Foot Exam - Simple   Simple Foot Form Diabetic Foot exam was performed with the following findings: Yes 06/10/2023  1:34 PM   Visual Inspection See comments: Yes Sensation Testing Intact to touch and monofilament testing bilaterally: Yes Pulse Check Posterior Tibialis and Dorsalis pulse intact bilaterally: Yes Comments Onychomycosis to left foot primarily     Results for orders placed or performed in visit on 02/04/23  TSH  Result Value Ref Range   TSH 1.000 0.450 - 4.500 uIU/mL  T4, free  Result Value Ref Range   Free T4 1.62 0.82 - 1.77 ng/dL  HgB Z6X  Result Value Ref Range   Hemoglobin A1C 6.2 (A) 4.0 - 5.6 %   HbA1c POC (<> result, manual entry)     HbA1c, POC (prediabetic range)     HbA1c, POC (controlled diabetic range)     Diabetic Labs (most recent): Lab Results  Component Value Date   HGBA1C 6.2 (A) 02/04/2023   HGBA1C 6.8 (A) 11/05/2022   HGBA1C 6.6 (A) 07/05/2022   Lipid Panel     Component Value Date/Time   CHOL 113 02/06/2022 0806   TRIG 67 02/06/2022 0806   HDL 34 (L) 02/06/2022 0806   CHOLHDL 3.3 02/06/2022 0806   CHOLHDL 4.3 04/13/2017 0547   VLDL 21 04/13/2017 0547   LDLCALC 65 02/06/2022 0806     Assessment & Plan:   1) Controlled type 2 diabetes mellitus with complication, with CKD GFR of 49, CVA  -Recently he had developed CVA requiring rehabilitation and physical therapy. He remains at a high risk for more acute and chronic complications of diabetes which include CAD, CVA, CKD, retinopathy, and neuropathy. These are all discussed in detail with the patient.  He presents today with his logs, no meter, showing stable at target glycemic profile overall.  His POCT A1c today is 6.4%, increasing slightly from last visit of 6.2%. He denies any hypoglycemia.   He did get steroid injection in right knee between visits.   Glucose logs and insulin administration records pertaining to this visit,  to be scanned into patient's records.  Recent labs reviewed.  - Nutritional counseling repeated at each appointment due to patients tendency to fall back in to old habits.  -  The patient admits there is a room for improvement in their diet and drink choices. -  Suggestion is made for the patient to avoid simple carbohydrates from their diet including Cakes, Sweet Desserts / Pastries, Ice Cream, Soda (diet and regular), Sweet Tea, Candies, Chips, Cookies, Sweet Pastries, Store Bought Juices, Alcohol in Excess of 1-2 drinks a day, Artificial Sweeteners, Coffee Creamer, and "Sugar-free" Products. This  will help patient to have stable blood glucose profile and potentially avoid unintended weight gain.   - I encouraged the patient to switch to unprocessed or minimally processed complex starch and increased protein intake (animal or plant source), fruits, and vegetables.   - Patient is advised to stick to a routine mealtimes to eat 3 meals a day and avoid unnecessary snacks (to snack only to correct hypoglycemia).  - I have approached patient with the following individualized plan to manage diabetes and patient agrees.  Avoiding hypoglycemia is the number 1 priority in his care.  An acceptable A1c for him would be around 7%.  Given his stable, at goal glycemic profile and lack of hypoglycemia, no changes will be made to his medication regimen today.  He is advised to continue his Semglee 12 units SQ nightly and continue Novolog 4-7 units TID with meals if glucose is above 90 and he is eating (Specific instructions on how to titrate insulin dosage based on glucose readings given to patient in writing).   - He would have benefited from continuous glucose monitoring, however he decided not to get it because he cannot afford the monthly co-pay.    - Adjustments for hypo-and hyperglycemia is given in written instructions to the patient.  -He is not a candidate for metformin, SGLT2 inhibitors, nor incretin in therapy due to history of liver transplant.  2) BP/HTN: -His blood pressure is controlled to target for his age.  He is advised to continue his current blood pressure  medications including Benicar 40 mg p.o. daily.     3) Lipids/HPL:  His most recent lipid panel from 08/09/22 shows controlled with LDL at 63.  He is not on statins as a result of his history of liver failure status post liver transplant.    4)  Weight/Diet:  His Body mass index is 30.3 kg/m.    Carbs/calories restrictions and optimal exercise program discussed with him.  5) Primary hypothyroidism: There are no recent TFTs to review.  He is advised to continue his Levothyroxine 125 mcg po daily before breakfast.     - We discussed about the correct intake of his thyroid hormone, on empty stomach at fasting, with water, separated by at least 30 minutes from breakfast and other medications,  and separated by more than 4 hours from calcium, iron, multivitamins, acid reflux medications (PPIs). -Patient is made aware of the fact that thyroid hormone replacement is needed for life, dose to be adjusted by periodic monitoring of thyroid function tests.  5) Chronic Care/Health Maintenance: -Patient is on ACEI/ARB  medications and encouraged to continue to follow up with Ophthalmology, Podiatrist at least yearly or according to recommendations, and advised to  stay away from smoking. I have recommended yearly flu vaccine and pneumonia vaccination at least every 5 years; and  sleep for at least 7 hours a day. -His foot exam is normal today.  - I advised patient to maintain close follow up with Assunta Found, MD for primary care needs.     I spent  28  minutes in the care of the patient today including review of labs from CMP, Lipids, Thyroid Function, Hematology (current and previous including abstractions from other facilities); face-to-face time discussing  his blood glucose readings/logs, discussing hypoglycemia and hyperglycemia episodes and symptoms, medications doses, his options of short and long term treatment based on the latest standards of care / guidelines;  discussion about incorporating  lifestyle medicine;  and documenting the encounter. Risk reduction  counseling performed per USPSTF guidelines to reduce obesity and cardiovascular risk factors.     Please refer to Patient Instructions for Blood Glucose Monitoring and Insulin/Medications Dosing Guide"  in media tab for additional information. Please  also refer to " Patient Self Inventory" in the Media  tab for reviewed elements of pertinent patient history.  [name] participated in the discussions, expressed understanding, and voiced agreement with the above plans.  All questions were answered to his satisfaction. he is encouraged to contact clinic should he have any questions or concerns prior to his return visit.    follow up plan: -Return in about 6 months (around 12/09/2023) for Diabetes F/U with A1c in office, No previsit labs, Bring meter and logs.   Ronny Bacon, [facility] [address] [place], [place] [address] Phone: [phone] Fax: [phone]  06/10/2023, 1:36 PM

## 2023-06-20 DIAGNOSIS — L905 Scar conditions and fibrosis of skin: Secondary | ICD-10-CM | POA: Diagnosis not present

## 2023-06-20 DIAGNOSIS — D225 Melanocytic nevi of trunk: Secondary | ICD-10-CM | POA: Diagnosis not present

## 2023-06-20 DIAGNOSIS — L281 Prurigo nodularis: Secondary | ICD-10-CM | POA: Diagnosis not present

## 2023-06-20 DIAGNOSIS — L570 Actinic keratosis: Secondary | ICD-10-CM | POA: Diagnosis not present

## 2023-06-20 DIAGNOSIS — X32XXXD Exposure to sunlight, subsequent encounter: Secondary | ICD-10-CM | POA: Diagnosis not present

## 2023-06-20 DIAGNOSIS — C44319 Basal cell carcinoma of skin of other parts of face: Secondary | ICD-10-CM | POA: Diagnosis not present

## 2023-06-20 DIAGNOSIS — L308 Other specified dermatitis: Secondary | ICD-10-CM | POA: Diagnosis not present

## 2023-07-30 ENCOUNTER — Other Ambulatory Visit: Payer: Self-pay | Admitting: Internal Medicine

## 2023-08-01 DIAGNOSIS — Z6829 Body mass index (BMI) 29.0-29.9, adult: Secondary | ICD-10-CM | POA: Diagnosis not present

## 2023-08-01 DIAGNOSIS — E663 Overweight: Secondary | ICD-10-CM | POA: Diagnosis not present

## 2023-08-01 DIAGNOSIS — Z0001 Encounter for general adult medical examination with abnormal findings: Secondary | ICD-10-CM | POA: Diagnosis not present

## 2023-08-01 DIAGNOSIS — Z1331 Encounter for screening for depression: Secondary | ICD-10-CM | POA: Diagnosis not present

## 2023-08-02 DIAGNOSIS — H26492 Other secondary cataract, left eye: Secondary | ICD-10-CM | POA: Diagnosis not present

## 2023-08-02 DIAGNOSIS — H52223 Regular astigmatism, bilateral: Secondary | ICD-10-CM | POA: Diagnosis not present

## 2023-08-02 DIAGNOSIS — Z961 Presence of intraocular lens: Secondary | ICD-10-CM | POA: Diagnosis not present

## 2023-08-02 DIAGNOSIS — H35033 Hypertensive retinopathy, bilateral: Secondary | ICD-10-CM | POA: Diagnosis not present

## 2023-08-02 DIAGNOSIS — H524 Presbyopia: Secondary | ICD-10-CM | POA: Diagnosis not present

## 2023-08-02 DIAGNOSIS — E119 Type 2 diabetes mellitus without complications: Secondary | ICD-10-CM | POA: Diagnosis not present

## 2023-08-02 LAB — HM DIABETES EYE EXAM

## 2023-08-05 ENCOUNTER — Telehealth: Payer: Self-pay | Admitting: *Deleted

## 2023-08-05 ENCOUNTER — Encounter: Payer: Self-pay | Admitting: Optometry

## 2023-08-05 NOTE — Telephone Encounter (Signed)
Yes, we got notification not too long ago.  He should call his pharmacy first.  They will let him know if his prescription was one that was recalled and then offer replacement.

## 2023-08-05 NOTE — Telephone Encounter (Signed)
Mailbox is full and cannot receive messages. Will try again

## 2023-08-05 NOTE — Telephone Encounter (Signed)
Patient left a voicemail that he had received a letter from his pharmacy that the Levothyroxine was being recalled. He is asking what should her do? Is there a different medication that he can take?

## 2023-08-06 NOTE — Telephone Encounter (Signed)
Talked with the patient. He is going to go down to the pharmacy and ask them what is the replacement offer for his Levothyroxine. He will call us back and let us know.

## 2023-08-13 LAB — LAB REPORT - SCANNED
Albumin, Urine POC: 68.5
Creatinine, POC: 100.2 mg/dL
Microalb Creat Ratio: 68
TSH: 1.91 (ref 0.41–5.90)

## 2023-08-15 DIAGNOSIS — Z85820 Personal history of malignant melanoma of skin: Secondary | ICD-10-CM | POA: Diagnosis not present

## 2023-08-15 DIAGNOSIS — Z08 Encounter for follow-up examination after completed treatment for malignant neoplasm: Secondary | ICD-10-CM | POA: Diagnosis not present

## 2023-08-15 DIAGNOSIS — X32XXXD Exposure to sunlight, subsequent encounter: Secondary | ICD-10-CM | POA: Diagnosis not present

## 2023-08-15 DIAGNOSIS — L570 Actinic keratosis: Secondary | ICD-10-CM | POA: Diagnosis not present

## 2023-10-18 DIAGNOSIS — E119 Type 2 diabetes mellitus without complications: Secondary | ICD-10-CM | POA: Diagnosis not present

## 2023-10-18 DIAGNOSIS — Z7960 Long term (current) use of unspecified immunomodulators and immunosuppressants: Secondary | ICD-10-CM | POA: Diagnosis not present

## 2023-10-18 DIAGNOSIS — E782 Mixed hyperlipidemia: Secondary | ICD-10-CM | POA: Diagnosis not present

## 2023-10-18 DIAGNOSIS — I10 Essential (primary) hypertension: Secondary | ICD-10-CM | POA: Diagnosis not present

## 2023-10-18 DIAGNOSIS — Z8673 Personal history of transient ischemic attack (TIA), and cerebral infarction without residual deficits: Secondary | ICD-10-CM | POA: Diagnosis not present

## 2023-10-18 DIAGNOSIS — Z48298 Encounter for aftercare following other organ transplant: Secondary | ICD-10-CM | POA: Diagnosis not present

## 2023-10-18 DIAGNOSIS — Z944 Liver transplant status: Secondary | ICD-10-CM | POA: Diagnosis not present

## 2023-11-08 DIAGNOSIS — Z7960 Long term (current) use of unspecified immunomodulators and immunosuppressants: Secondary | ICD-10-CM | POA: Diagnosis not present

## 2023-11-08 DIAGNOSIS — Z944 Liver transplant status: Secondary | ICD-10-CM | POA: Diagnosis not present

## 2023-11-08 DIAGNOSIS — Z48298 Encounter for aftercare following other organ transplant: Secondary | ICD-10-CM | POA: Diagnosis not present

## 2023-11-11 ENCOUNTER — Other Ambulatory Visit: Payer: Self-pay | Admitting: Nurse Practitioner

## 2023-11-12 NOTE — Telephone Encounter (Signed)
 We need to complete a PA for him to test 4 times daily.  For safety reasons, he should monitor glucose before he injects insulin  and he injects 4 times per day.

## 2023-11-14 DIAGNOSIS — L281 Prurigo nodularis: Secondary | ICD-10-CM | POA: Diagnosis not present

## 2023-11-14 DIAGNOSIS — C44319 Basal cell carcinoma of skin of other parts of face: Secondary | ICD-10-CM | POA: Diagnosis not present

## 2023-11-14 DIAGNOSIS — Z08 Encounter for follow-up examination after completed treatment for malignant neoplasm: Secondary | ICD-10-CM | POA: Diagnosis not present

## 2023-11-14 DIAGNOSIS — C4441 Basal cell carcinoma of skin of scalp and neck: Secondary | ICD-10-CM | POA: Diagnosis not present

## 2023-11-14 DIAGNOSIS — D225 Melanocytic nevi of trunk: Secondary | ICD-10-CM | POA: Diagnosis not present

## 2023-11-14 DIAGNOSIS — Z85828 Personal history of other malignant neoplasm of skin: Secondary | ICD-10-CM | POA: Diagnosis not present

## 2023-11-15 ENCOUNTER — Other Ambulatory Visit (HOSPITAL_COMMUNITY): Payer: Self-pay

## 2023-11-18 ENCOUNTER — Other Ambulatory Visit (HOSPITAL_COMMUNITY): Payer: Self-pay

## 2023-11-18 ENCOUNTER — Telehealth: Payer: Self-pay

## 2023-11-18 NOTE — Telephone Encounter (Signed)
 Pharmacy Patient Advocate Encounter  Received notification from [facility] that Prior Authorization for OneTouch Ultra test strips has been APPROVED from 11/18/23 to 11/17/2024   PA #/Case ID/Reference #: [id number]

## 2023-11-18 NOTE — Telephone Encounter (Signed)
 PA is pending for him to test glucose 4 times daily.

## 2023-11-18 NOTE — Telephone Encounter (Signed)
 Pharmacy Patient Advocate Encounter   Received notification from Pt Calls Messages that prior authorization for Onetouch ultra test strips is required/requested.   Insurance verification completed.   The patient is insured through [facility] .   Per test claim: PA required; PA submitted to above mentioned insurance via CoverMyMeds Key/confirmation #/EOC [id number] Status is pending

## 2023-11-20 ENCOUNTER — Other Ambulatory Visit: Payer: Self-pay | Admitting: *Deleted

## 2023-11-20 DIAGNOSIS — E1122 Type 2 diabetes mellitus with diabetic chronic kidney disease: Secondary | ICD-10-CM

## 2023-11-20 DIAGNOSIS — Z794 Long term (current) use of insulin: Secondary | ICD-10-CM

## 2023-11-20 MED ORDER — ONETOUCH ULTRA VI STRP: ORAL_STRIP | 3 refills | Status: DC

## 2023-11-20 NOTE — Telephone Encounter (Signed)
 Patient was called and made aware, a prescription and a [facility] the [facility] was [facility] to this pharmacy. Patient states that he will pick  up on Thursday, May 15,2025.

## 2023-11-20 NOTE — Telephone Encounter (Signed)
 Looks like the request was approved, in separate note (labeled under [facility])

## 2023-11-27 ENCOUNTER — Telehealth: Payer: Self-pay | Admitting: *Deleted

## 2023-11-27 NOTE — Telephone Encounter (Signed)
 Patient was called and made aware.

## 2023-11-27 NOTE — Telephone Encounter (Signed)
 Have him hold his insulin  altogether for now and keep an eye on readings.  Sounds like glucose has been better recently and thus he doesn't need as much meds.

## 2023-11-27 NOTE — Telephone Encounter (Signed)
 Patient left a message with Access Nurse on 11/26/2023. He shared that he had poor sugar ratings. He reported that he had readings of 85,80,87 and 76. He shared that his nighttime readings are 166,115,150 and 117. He reported to them that he takes 8 units of Lantus .  Patient called. He has a regular meter, I ask if he could bring that in so that we may review all the readings, he does not feel that he can before his appointment June 2. Patient could not give additional readings. He states that he was calling to let Laurina Popper know that he had 4 days of blood sugar readings below 90.  He is taking a version of Lantus  8 units at night , Novolog  is taking 4-5 units three times a day per sliding scale. He had held insulin  when the blood sugar is below 90 and he is eating.

## 2023-12-03 NOTE — Telephone Encounter (Signed)
 Patient called the after hours line stating he almost died because he was taken off insulin . He is asking to see Dr Monte Antonio. Dr Nida is not taking new patients right now. Can you call and speak with patient?

## 2023-12-03 NOTE — Telephone Encounter (Signed)
 Dr Monte Antonio Patient is asking to switch to you. Do you approve this?

## 2023-12-03 NOTE — Telephone Encounter (Signed)
 Tried to call pt and vm full

## 2023-12-04 NOTE — Telephone Encounter (Signed)
 Noted, Dr. Monte Antonio will see the patient and that attempt has been made to contact the patient but his voicemail.

## 2023-12-09 ENCOUNTER — Ambulatory Visit: Payer: Medicare HMO | Admitting: Nurse Practitioner

## 2024-01-09 ENCOUNTER — Ambulatory Visit: Admitting: "Endocrinology

## 2024-01-09 ENCOUNTER — Encounter: Payer: Self-pay | Admitting: "Endocrinology

## 2024-01-09 VITALS — BP 148/85 | HR 64 | Ht 69.5 in | Wt 197.4 lb

## 2024-01-09 DIAGNOSIS — Z794 Long term (current) use of insulin: Secondary | ICD-10-CM

## 2024-01-09 DIAGNOSIS — E039 Hypothyroidism, unspecified: Secondary | ICD-10-CM | POA: Diagnosis not present

## 2024-01-09 DIAGNOSIS — I10 Essential (primary) hypertension: Secondary | ICD-10-CM

## 2024-01-09 DIAGNOSIS — N1831 Chronic kidney disease, stage 3a: Secondary | ICD-10-CM | POA: Diagnosis not present

## 2024-01-09 DIAGNOSIS — E1122 Type 2 diabetes mellitus with diabetic chronic kidney disease: Secondary | ICD-10-CM | POA: Diagnosis not present

## 2024-01-09 LAB — POCT GLYCOSYLATED HEMOGLOBIN (HGB A1C): HbA1c, POC (controlled diabetic range): 6.2 % (ref 0.0–7.0)

## 2024-01-09 NOTE — Patient Instructions (Signed)

## 2024-01-09 NOTE — Progress Notes (Signed)
 01/09/2024              Endocrinology follow-up note   Subjective:    Patient ID: [name], male    DOB: 01/06/1941, PCP Marvine Rush, MD   Past Medical History:  Diagnosis Date   Anemia    Biliary calculi, common bile duct    CRI (chronic renal insufficiency)    DM (diabetes mellitus) (HCC)    Type II   Elevated liver enzymes    Esophageal varices (HCC) 03/19/11   mrcp   History of ERCP 03/21/2011   Extrahepatic biliary obstruction secondary to occluded biliary stent and colon no cholelithiasis, status post sphincterotomy, stent removal and replacement and stone extraction.   HTN (hypertension)    Hypothyroidism    NASH (nonalcoholic steatohepatitis) 2006   liver transplant   Pancreatitis chronic 03/19/11   mrcp   S/P colonoscopy with polypectomy 2004   Dr Shaaron   S/P endoscopy 03/2004   Hx esophgaeal varices, pyloric channel ulcer prior to transplant   Squamous cell cancer of external ear    pinna   Stroke (HCC)    mini-stroke, weakness of left leg.   Past Surgical History:  Procedure Laterality Date   BILE DUCT STENT PLACEMENT  03/21/11   Dr. Shaaron   BIOPSY  02/12/2020   Procedure: BIOPSY;  Surgeon: Eartha Angelia Sieving, MD;  [facility]: [facility];  Service: Gastroenterology;;   CATARACT EXTRACTION Bilateral    CHOLECYSTECTOMY  2006   COLONOSCOPY  08/12/2002   Dr. Shaaron- somewhat diffusely friable rectum and colon, polyp at 30 cm.   COLONOSCOPY N/A 09/08/2012   Procedure: COLONOSCOPY;  Surgeon: Lamar CHRISTELLA Shaaron, MD;  colonic diverticulosis. Repeat in 10 years.    ERCP  03/21/2011   Dr. Shaaron- ERCP with removal of pediatric feeding tube, endoscopic sphinecterotomy with balloon dilation of ampulla followed by stone extraction and stent placement   ESOPHAGOGASTRODUODENOSCOPY  03/20/2004   Dr. Juliann 2 esophageal varices o/w normal esophageal mucosa, mild changes of snake skin in the gastric mucosa consistent with portal gastropathy, multiple antral erosions   and  a single pyloric channel ulcer benign appearance o/w normal gastric mucosa, normal D1 and S2   ESOPHAGOGASTRODUODENOSCOPY (EGD) WITH PROPOFOL  N/A 02/12/2020   Procedure: ESOPHAGOGASTRODUODENOSCOPY (EGD) WITH PROPOFOL ;  Surgeon: Eartha Angelia, Sieving, MD; Food impaction in the middle third of the esophagus s/p removal, three superficial esophageal ulcerations with no stigmata of recent bleeding, one cratered nonnecrotic esophageal ulcer with no stigmata of recent bleed in the mid esophagus s/p biopsied. Path consistet with ulcer.    ESOPHAGOGASTRODUODENOSCOPY (EGD) WITH PROPOFOL  N/A 08/22/2020   Procedure: ESOPHAGOGASTRODUODENOSCOPY (EGD) WITH PROPOFOL ;  Surgeon: Shaaron Lamar CHRISTELLA, MD;  [facility]: [facility];  Service: Endoscopy;  Laterality: N/A;  7:30am   KNEE ARTHROSCOPY WITH EXCISION PLICA Left 11/21/2012   Procedure: KNEE ARTHROSCOPY WITH EXCISION PLICA;  Surgeon: Taft FORBES Minerva, MD;  Location: [facility];  Service: Orthopedics;  Laterality: Left;   KNEE ARTHROSCOPY WITH MEDIAL MENISECTOMY Left 11/21/2012   Procedure: KNEE ARTHROSCOPY WITH MEDIAL AND LATERAL MENISECTOMY;  Surgeon: Taft FORBES Minerva, MD;  Location: [facility];  Service: Orthopedics;  Laterality: Left;   LIVER TRANSPLANT  2006   UVA-NASH cirrhosis   MALONEY DILATION N/A 08/22/2020   Procedure: AGAPITO DILATION;  Surgeon: Shaaron Lamar CHRISTELLA, MD;  Location: [facility];  Service: Endoscopy;  Laterality: N/A;   MOUTH SURGERY  may 2012   MRCP  03/19/11   biliary ductal dilatation- stones. esophageal  varices   SPHINCTEROTOMY  03/21/2011   Procedure: SPHINCTEROTOMY;  Surgeon: Lamar CHRISTELLA Hollingshead, MD;  Location: [facility];  Service: Gastroenterology;;   Social History   Socioeconomic History   Marital status: Married    Spouse name: Not on file   Number of children: 1   Years of education: 14   Highest education level: Not on file  Occupational History   Occupation: RETIRED    Employer: RETIRED  Tobacco Use   Smoking status: Never    Smokeless tobacco: Never  Vaping Use   Vaping status: Never Used  Substance and Sexual Activity   Alcohol use: No   Drug use: No   Sexual activity: Yes  Other Topics Concern   Not on file  Social History Narrative   Not on file   Social Drivers of Health   Financial Resource Strain: Low Risk  (01/16/2023)   Overall Financial Resource Strain (CARDIA)    Difficulty of Paying Living Expenses: Not hard at all  Food Insecurity: No Food Insecurity (10/17/2022)   Hunger Vital Sign    Worried About Running Out of Food in the Last Year: Never true    Ran Out of Food in the Last Year: Never true  Transportation Needs: No Transportation Needs (01/16/2023)   PRAPARE - Administrator, Civil Service (Medical): No    Lack of Transportation (Non-Medical): No  Physical Activity: Sufficiently Active (10/17/2022)   Exercise Vital Sign    Days of Exercise per Week: 7 days    Minutes of Exercise per Session: 60 min  Stress: Not on file  Social Connections: Socially Integrated (10/17/2022)   Social Connection and Isolation Panel    Frequency of Communication with Friends and Family: More than three times a week    Frequency of Social Gatherings with Friends and Family: More than three times a week    Attends Religious Services: More than 4 times per year    Active Member of [employer] or Organizations: Yes    Attends Banker Meetings: More than 4 times per year    Marital Status: Married   Outpatient Encounter Medications as of 01/09/2024  Medication Sig   insulin  glargine (LANTUS ) 100 UNIT/ML injection Inject 6 Units into the skin at bedtime.   [DISCONTINUED] insulin  glargine-yfgn (SEMGLEE ) 100 UNIT/ML Pen Inject 12 Units into the skin at bedtime. (Patient taking differently: Inject 4 Units into the skin at bedtime.)   aspirin  325 MG tablet Take 1 tablet (325 mg total) by mouth daily.   famotidine  (PEPCID ) 20 MG tablet TAKE 1 TABLET BY MOUTH EVERY DAY   glucose blood (ONETOUCH  ULTRA) test strip USE TO TEST BLOOD SUGAR FOUR TIMES DAILY.   levothyroxine  (SYNTHROID ) 125 MCG tablet Take 1 tablet (125 mcg total) by mouth daily before breakfast.   MAGNESIUM PO Take 133 mg by mouth 2 (two) times daily.   Multiple Vitamins-Minerals (MULTIVITAMIN WITH MINERALS) tablet Take 1 tablet by mouth daily.     olmesartan (BENICAR) 40 MG tablet Take 40 mg by mouth daily.    predniSONE (DELTASONE) 5 MG tablet Take 5 mg by mouth daily as needed (As needed for gout).   tacrolimus  (PROGRAF ) 0.5 MG capsule Take 0.5 mg by mouth 2 (two) times daily.    [DISCONTINUED] insulin  aspart (NOVOLOG  FLEXPEN) 100 UNIT/ML FlexPen Inject 4-7 Units into the skin 3 (three) times daily with meals. Give per sliding scale three times a day   No facility-administered encounter medications on file as  of 01/09/2024.   ALLERGIES: Allergies  Allergen Reactions   Contrast Media [Iodinated Contrast Media] Rash   VACCINATION STATUS: Immunization History  Administered Date(s) Administered   Influenza Split 02/13/2011, 04/29/2012, 04/20/2013, 05/09/2013   Influenza, High Dose Seasonal PF 04/26/2015, 04/13/2017   Influenza-Unspecified 04/08/2013   Moderna Sars-Covid-2 Vaccination 07/30/2019, 09/04/2019   Pneumococcal Conjugate-13 07/29/2014   Pneumococcal Polysaccharide-23 03/17/2010, 04/16/2011    Diabetes He presents for his follow-up diabetic visit. He has type 2 diabetes mellitus. Onset time: He was diagnosed at approximate age of 13 years. His disease course has been improving. Pertinent negatives for hypoglycemia include no confusion, headaches, pallor, seizures or sweats. Associated symptoms include weight loss. Pertinent negatives for diabetes include no chest pain, no polydipsia, no polyphagia, no polyuria and no weakness. There are no hypoglycemic complications. (He had 1 episode of nocturnal hypoglycemia in the last month.) Symptoms are improving. Diabetic complications include a CVA and nephropathy. Risk  factors for coronary artery disease include diabetes mellitus, dyslipidemia, hypertension, male sex, obesity, sedentary lifestyle and tobacco exposure. Current diabetic treatment includes intensive insulin  program. He is compliant with treatment most of the time. His weight is decreasing steadily. Meal planning includes avoidance of concentrated sweets. He rarely participates in exercise. His home blood glucose trend is decreasing steadily. His breakfast blood glucose range is generally 110-130 mg/dl. His lunch blood glucose range is generally 110-130 mg/dl. His dinner blood glucose range is generally 110-130 mg/dl. His bedtime blood glucose range is generally 110-130 mg/dl. His overall blood glucose range is 110-130 mg/dl. (He presents today with his logs, showing tightly controlled glycemic profile and point-of-care A1c of 6.2%.  He does have rare, random hypoglycemia.   He is achieving remarkably weight loss over the course of several months mainly due to changes in his diet.) An ACE inhibitor/angiotensin II receptor blocker is being taken. Eye exam is current.  Hyperlipidemia This is a chronic problem. The current episode started more than 1 year ago. The problem is controlled. Recent lipid tests were reviewed and are normal. Exacerbating diseases include hypothyroidism and obesity. Pertinent negatives include no chest pain, myalgias or shortness of breath. He is currently on no antihyperlipidemic treatment. Compliance problems include adherence to exercise.  Risk factors for coronary artery disease include dyslipidemia, diabetes mellitus, hypertension, male sex, obesity and a sedentary lifestyle.  Hypertension This is a chronic problem. The current episode started more than 1 year ago. The problem has been resolved since onset. The problem is controlled. Pertinent negatives include no chest pain, headaches, neck pain, shortness of breath or sweats. There are no associated agents to hypertension. Risk  factors for coronary artery disease include dyslipidemia, family history, obesity, male gender and sedentary lifestyle. Past treatments include ACE inhibitors. The current treatment provides mild improvement. There are no compliance problems.  Hypertensive end-organ damage includes CVA. He is status post liver transplant..     Review of systems  Constitutional: + steadily decreasing body weight intentionally,  current Body mass index is 28.73 kg/m. , no fatigue, no subjective hyperthermia, no subjective hypothermia   Objective:    BP (!) 148/85   Pulse 64   Ht 5' 9.5 (1.765 m)   Wt 197 lb 6.4 oz (89.5 kg)   BMI 28.73 kg/m   Wt Readings from Last 3 Encounters:  01/09/24 197 lb 6.4 oz (89.5 kg)  06/10/23 208 lb 3.2 oz (94.4 kg)  03/10/23 214 lb (97.1 kg)     BP Readings from Last 3 Encounters:  01/09/24 ROLLEN)  148/85  06/10/23 (!) 140/82  03/10/23 (!) 170/86     Physical Exam- Limited  Constitutional:  Body mass index is 28.73 kg/m. , not in acute distress, normal state of mind    Diabetic Foot Exam - Simple   No data filed     Results for orders placed or performed in visit on 01/09/24  HgB A1c   Collection Time: 01/09/24  3:00 PM  Result Value Ref Range   Hemoglobin A1C     HbA1c POC (<> result, manual entry)     HbA1c, POC (prediabetic range)     HbA1c, POC (controlled diabetic range) 6.2 0.0 - 7.0 %   Diabetic Labs (most recent): Lab Results  Component Value Date   HGBA1C 6.2 01/09/2024   HGBA1C 6.4 06/10/2023   HGBA1C 6.2 (A) 02/04/2023   Lipid Panel     Component Value Date/Time   CHOL 113 02/06/2022 0806   TRIG 67 02/06/2022 0806   HDL 34 (L) 02/06/2022 0806   CHOLHDL 3.3 02/06/2022 0806   CHOLHDL 4.3 04/13/2017 0547   VLDL 21 04/13/2017 0547   LDLCALC 65 02/06/2022 0806     Assessment & Plan:   1) Controlled type 2 diabetes mellitus with complication, with CKD GFR of 49, CVA  -Recently he had developed CVA requiring rehabilitation and  physical therapy. He remains at a high risk for more acute and chronic complications of diabetes which include CAD, CVA, CKD, retinopathy, and neuropathy. These are all discussed in detail with the patient.  He presents today with his logs, showing tightly controlled glycemic profile and point-of-care A1c of 6.2%.  He does have rare, random hypoglycemia.   He is achieving remarkably weight loss over the course of several months mainly due to changes in his diet.   Glucose logs and insulin  administration records pertaining to this visit,  to be scanned into patient's records.  Recent labs reviewed.  - Nutritional counseling repeated at each appointment due to patients tendency to fall back in to old habits.  - The patient admits there is a room for improvement in their diet and drink choices. -  Suggestion is made for the patient to avoid simple carbohydrates from their diet including Cakes, Sweet Desserts / Pastries, Ice Cream, Soda (diet and regular), Sweet Tea, Candies, Chips, Cookies, Sweet Pastries, Store Bought Juices, Alcohol in Excess of 1-2 drinks a day, Artificial Sweeteners, Coffee Creamer, and Sugar-free Products. This will help patient to have stable blood glucose profile and potentially avoid unintended weight gain.   - I encouraged the patient to switch to unprocessed or minimally processed complex starch and increased protein intake (animal or plant source), fruits, and vegetables.   - Patient is advised to stick to a routine mealtimes to eat 3 meals a day and avoid unnecessary snacks (to snack only to correct hypoglycemia).  - I have approached patient with the following individualized plan to manage diabetes and patient agrees.  Avoiding hypoglycemia is the number 1 priority in his care, especially from overuse of insulin . -Based on his presentation with tight glycemic control, he will be taken off of prandial insulin  for now.    - He is advised to continue Lantus  at 6 units  nightly, hold NovoLog  until next visit.  He will continue to monitor blood glucose 4 times daily at-before meals and at bedtime.   - He would have benefited from continuous glucose monitoring, however he decided not to get it because he cannot afford the monthly co-pay.  He is not a candidate for metformin, SGLT2 inhibitors, nor incretin in therapy due to history of liver transplant. He will call clinic for hypoglycemia under 70 or hyperglycemia above 150 mg per DL 3 Premeal tests in a row.SABRA  2) BP/HTN: His blood pressure is near target.  He is advised to continue his current blood pressure medications including Benicar 40 mg p.o. daily.     3) Lipids/HPL:  His most recent lipid panel showed controlled LDL at 65.  He is not on statins  as a result of his history of liver failure status post liver transplant.    4)  Weight/Diet:  His Body mass index is 28.73 kg/m.  This patient has lost more than 50 pounds over the last 2 years-mostly intentionally, carbs/calories restrictions and optimal exercise program discussed with him.  5) Primary hypothyroidism: His TSH was 1.91 in February 2025.  He is advised to continue his Levothyroxine  125 mcg po daily before breakfast.     - We discussed about the correct intake of his thyroid  hormone, on empty stomach at fasting, with water , separated by at least 30 minutes from breakfast and other medications,  and separated by more than 4 hours from calcium, iron, multivitamins, acid reflux medications (PPIs). -Patient is made aware of the fact that thyroid  hormone replacement is needed for life, dose to be adjusted by periodic monitoring of thyroid  function tests.  5) Chronic Care/Health Maintenance: -Patient is on ACEI/ARB  medications and encouraged to continue to follow up with Ophthalmology, Podiatrist at least yearly or according to recommendations, and advised to  stay away from smoking. I have recommended yearly flu vaccine and pneumonia vaccination at  least every 5 years; and  sleep for at least 7 hours a day. -His foot exam is normal today.  - I advised patient to maintain close follow up with Marvine Rush, MD for primary care needs.    I spent  26  minutes in the care of the patient today including review of labs from CMP, Lipids, Thyroid  Function, Hematology (current and previous including abstractions from other facilities); face-to-face time discussing  his blood glucose readings/logs, discussing hypoglycemia and hyperglycemia episodes and symptoms, medications doses, his options of short and long term treatment based on the latest standards of care / guidelines;  discussion about incorporating lifestyle medicine;  and documenting the encounter. Risk reduction counseling performed per USPSTF guidelines to reduce cardiovascular risk factors.     Please refer to Patient Instructions for Blood Glucose Monitoring and Insulin /Medications Dosing Guide  in media tab for additional information. Please  also refer to  Patient Self Inventory in the Media  tab for reviewed elements of pertinent patient history.  Vaughn VEAR Situ participated in the discussions, expressed understanding, and voiced agreement with the above plans.  All questions were answered to his satisfaction. he is encouraged to contact clinic should he have any questions or concerns prior to his return visit.   follow up plan: -Return for Bring Meter/CGM Device/Logs- A1c in Office.   Ranny Earl, MD [facility] [address] [place], [place] [address] Phone: [phone] Fax: [phone]  01/09/2024, 5:47 PM

## 2024-02-07 DIAGNOSIS — Z7960 Long term (current) use of unspecified immunomodulators and immunosuppressants: Secondary | ICD-10-CM | POA: Diagnosis not present

## 2024-02-07 DIAGNOSIS — Z944 Liver transplant status: Secondary | ICD-10-CM | POA: Diagnosis not present

## 2024-02-07 DIAGNOSIS — Z48298 Encounter for aftercare following other organ transplant: Secondary | ICD-10-CM | POA: Diagnosis not present

## 2024-02-24 DIAGNOSIS — N390 Urinary tract infection, site not specified: Secondary | ICD-10-CM | POA: Diagnosis not present

## 2024-02-24 DIAGNOSIS — E663 Overweight: Secondary | ICD-10-CM | POA: Diagnosis not present

## 2024-02-24 DIAGNOSIS — Z6828 Body mass index (BMI) 28.0-28.9, adult: Secondary | ICD-10-CM | POA: Diagnosis not present

## 2024-04-02 NOTE — Progress Notes (Signed)
 [name]                                          MRN: [id number]   04/02/2024   The [facility] Quality Team Specialist reviewed this patient medical record for the purposes of chart review for care gap closure. The following were reviewed: chart review for care gap closure-controlling blood pressure.    [facility] Quality Team

## 2024-04-15 ENCOUNTER — Ambulatory Visit: Admitting: "Endocrinology

## 2024-04-15 ENCOUNTER — Encounter: Payer: Self-pay | Admitting: "Endocrinology

## 2024-04-15 VITALS — BP 140/88 | HR 68 | Ht 69.5 in | Wt 194.4 lb

## 2024-04-15 DIAGNOSIS — E039 Hypothyroidism, unspecified: Secondary | ICD-10-CM

## 2024-04-15 DIAGNOSIS — N1831 Chronic kidney disease, stage 3a: Secondary | ICD-10-CM | POA: Diagnosis not present

## 2024-04-15 DIAGNOSIS — I10 Essential (primary) hypertension: Secondary | ICD-10-CM

## 2024-04-15 DIAGNOSIS — Z794 Long term (current) use of insulin: Secondary | ICD-10-CM | POA: Diagnosis not present

## 2024-04-15 DIAGNOSIS — E1122 Type 2 diabetes mellitus with diabetic chronic kidney disease: Secondary | ICD-10-CM

## 2024-04-15 LAB — POCT GLYCOSYLATED HEMOGLOBIN (HGB A1C): HbA1c, POC (controlled diabetic range): 7 % (ref 0.0–7.0)

## 2024-04-15 NOTE — Progress Notes (Signed)
 04/15/2024              Endocrinology follow-up note   Subjective:    Patient ID: [name], male    DOB: 11-30-1940, PCP Marvine Rush, MD   Past Medical History:  Diagnosis Date   Anemia    Biliary calculi, common bile duct    CRI (chronic renal insufficiency)    DM (diabetes mellitus) (HCC)    Type II   Elevated liver enzymes    Esophageal varices (HCC) 03/19/11   mrcp   History of ERCP 03/21/2011   Extrahepatic biliary obstruction secondary to occluded biliary stent and colon no cholelithiasis, status post sphincterotomy, stent removal and replacement and stone extraction.   HTN (hypertension)    Hypothyroidism    NASH (nonalcoholic steatohepatitis) 2006   liver transplant   Pancreatitis chronic 03/19/11   mrcp   S/P colonoscopy with polypectomy 2004   Dr Shaaron   S/P endoscopy 03/2004   Hx esophgaeal varices, pyloric channel ulcer prior to transplant   Squamous cell cancer of external ear    pinna   Stroke (HCC)    mini-stroke, weakness of left leg.   Past Surgical History:  Procedure Laterality Date   BILE DUCT STENT PLACEMENT  03/21/11   Dr. Shaaron   BIOPSY  02/12/2020   Procedure: BIOPSY;  Surgeon: Eartha Angelia Sieving, MD;  Location: [facility];  Service: Gastroenterology;;   CATARACT EXTRACTION Bilateral    CHOLECYSTECTOMY  2006   COLONOSCOPY  08/12/2002   Dr. Shaaron- somewhat diffusely friable rectum and colon, polyp at 30 cm.   COLONOSCOPY N/A 09/08/2012   Procedure: COLONOSCOPY;  Surgeon: Lamar CHRISTELLA Shaaron, MD;  colonic diverticulosis. Repeat in 10 years.    ERCP  03/21/2011   Dr. Shaaron- ERCP with removal of pediatric feeding tube, endoscopic sphinecterotomy with balloon dilation of ampulla followed by stone extraction and stent placement   ESOPHAGOGASTRODUODENOSCOPY  03/20/2004   Dr. Juliann 2 esophageal varices o/w normal esophageal mucosa, mild changes of snake skin in the gastric mucosa consistent with portal gastropathy, multiple antral erosions   and  a single pyloric channel ulcer benign appearance o/w normal gastric mucosa, normal D1 and S2   ESOPHAGOGASTRODUODENOSCOPY (EGD) WITH PROPOFOL  N/A 02/12/2020   Procedure: ESOPHAGOGASTRODUODENOSCOPY (EGD) WITH PROPOFOL ;  Surgeon: Eartha Angelia, Sieving, MD; Food impaction in the middle third of the esophagus s/p removal, three superficial esophageal ulcerations with no stigmata of recent bleeding, one cratered nonnecrotic esophageal ulcer with no stigmata of recent bleed in the mid esophagus s/p biopsied. Path consistet with ulcer.    ESOPHAGOGASTRODUODENOSCOPY (EGD) WITH PROPOFOL  N/A 08/22/2020   Procedure: ESOPHAGOGASTRODUODENOSCOPY (EGD) WITH PROPOFOL ;  Surgeon: Shaaron Lamar CHRISTELLA, MD;  Location: [facility];  Service: Endoscopy;  Laterality: N/A;  7:30am   KNEE ARTHROSCOPY WITH EXCISION PLICA Left 11/21/2012   Procedure: KNEE ARTHROSCOPY WITH EXCISION PLICA;  Surgeon: Taft FORBES Minerva, MD;  Location: [facility];  Service: Orthopedics;  Laterality: Left;   KNEE ARTHROSCOPY WITH MEDIAL MENISECTOMY Left 11/21/2012   Procedure: KNEE ARTHROSCOPY WITH MEDIAL AND LATERAL MENISECTOMY;  Surgeon: Taft FORBES Minerva, MD;  Location: [facility];  Service: Orthopedics;  Laterality: Left;   LIVER TRANSPLANT  2006   UVA-NASH cirrhosis   MALONEY DILATION N/A 08/22/2020   Procedure: AGAPITO DILATION;  Surgeon: Shaaron Lamar CHRISTELLA, MD;  Location: [facility];  Service: Endoscopy;  Laterality: N/A;   MOUTH SURGERY  may 2012   MRCP  03/19/11   biliary ductal dilatation- stones. esophageal  varices   SPHINCTEROTOMY  03/21/2011   Procedure: SPHINCTEROTOMY;  Surgeon: Lamar CHRISTELLA Hollingshead, MD;  Location: [facility];  Service: Gastroenterology;;   Social History   Socioeconomic History   Marital status: Married    Spouse name: Not on file   Number of children: 1   Years of education: 14   Highest education level: Not on file  Occupational History   Occupation: RETIRED    Employer: RETIRED  Tobacco Use   Smoking status: Never    Smokeless tobacco: Never  Vaping Use   Vaping status: Never Used  Substance and Sexual Activity   Alcohol use: No   Drug use: No   Sexual activity: Yes  Other Topics Concern   Not on file  Social History Narrative   Not on file   Social Drivers of Health   Financial Resource Strain: Low Risk  (01/16/2023)   Overall Financial Resource Strain (CARDIA)    Difficulty of Paying Living Expenses: Not hard at all  Food Insecurity: No Food Insecurity (10/17/2022)   Hunger Vital Sign    Worried About Running Out of Food in the Last Year: Never true    Ran Out of Food in the Last Year: Never true  Transportation Needs: No Transportation Needs (01/16/2023)   PRAPARE - Administrator, Civil Service (Medical): No    Lack of Transportation (Non-Medical): No  Physical Activity: Sufficiently Active (10/17/2022)   Exercise Vital Sign    Days of Exercise per Week: 7 days    Minutes of Exercise per Session: 60 min  Stress: Not on file  Social Connections: Socially Integrated (10/17/2022)   Social Connection and Isolation Panel    Frequency of Communication with Friends and Family: More than three times a week    Frequency of Social Gatherings with Friends and Family: More than three times a week    Attends Religious Services: More than 4 times per year    Active Member of [employer] or Organizations: Yes    Attends Banker Meetings: More than 4 times per year    Marital Status: Married   Outpatient Encounter Medications as of 04/15/2024  Medication Sig   aspirin  325 MG tablet Take 1 tablet (325 mg total) by mouth daily.   famotidine  (PEPCID ) 20 MG tablet TAKE 1 TABLET BY MOUTH EVERY DAY   glucose blood (ONETOUCH ULTRA) test strip USE TO TEST BLOOD SUGAR FOUR TIMES DAILY.   insulin  glargine (LANTUS ) 100 UNIT/ML injection Inject 6 Units into the skin at bedtime.   levothyroxine  (SYNTHROID ) 125 MCG tablet Take 1 tablet (125 mcg total) by mouth daily before breakfast.   MAGNESIUM  PO Take 133 mg by mouth 2 (two) times daily.   Multiple Vitamins-Minerals (MULTIVITAMIN WITH MINERALS) tablet Take 1 tablet by mouth daily.     olmesartan (BENICAR) 40 MG tablet Take 40 mg by mouth daily.    predniSONE (DELTASONE) 5 MG tablet Take 5 mg by mouth daily as needed (As needed for gout).   tacrolimus  (PROGRAF ) 0.5 MG capsule Take 0.5 mg by mouth 2 (two) times daily.    No facility-administered encounter medications on file as of 04/15/2024.   ALLERGIES: Allergies  Allergen Reactions   Contrast Media [Iodinated Contrast Media] Rash   VACCINATION STATUS: Immunization History  Administered Date(s) Administered   INFLUENZA, HIGH DOSE SEASONAL PF 04/26/2015, 04/13/2017   Influenza Split 02/13/2011, 04/29/2012, 04/20/2013, 05/09/2013   Influenza-Unspecified 04/08/2013   Moderna Sars-Covid-2 Vaccination 07/30/2019, 09/04/2019   Pneumococcal  Conjugate-13 07/29/2014   Pneumococcal Polysaccharide-23 03/17/2010, 04/16/2011    Diabetes He presents for his follow-up diabetic visit. He has type 2 diabetes mellitus. Onset time: He was diagnosed at approximate age of 26 years. His disease course has been improving. Pertinent negatives for hypoglycemia include no confusion, headaches, pallor, seizures or sweats. Associated symptoms include weight loss. Pertinent negatives for diabetes include no chest pain, no polydipsia, no polyphagia, no polyuria and no weakness. There are no hypoglycemic complications. (He had 1 episode of nocturnal hypoglycemia in the last month.) Symptoms are improving. Diabetic complications include a CVA and nephropathy. Risk factors for coronary artery disease include diabetes mellitus, dyslipidemia, hypertension, male sex, obesity, sedentary lifestyle and tobacco exposure. Current diabetic treatment includes intensive insulin  program. He is compliant with treatment most of the time. His weight is decreasing steadily. Meal planning includes avoidance of concentrated sweets.  He rarely participates in exercise. His home blood glucose trend is fluctuating minimally. His breakfast blood glucose range is generally 110-130 mg/dl. His lunch blood glucose range is generally 130-140 mg/dl. His dinner blood glucose range is generally 130-140 mg/dl. His bedtime blood glucose range is generally 130-140 mg/dl. His overall blood glucose range is 130-140 mg/dl. (He presents today with his logs, showing controlled glycemic profile.  His point-of-care A1c 7%.  He did not document hypoglycemia.  He is taking only 6 units of Lantus  nightly, did not require any prandial insulin  since last visit.   ) An ACE inhibitor/angiotensin II receptor blocker is being taken. Eye exam is current.  Hyperlipidemia This is a chronic problem. The current episode started more than 1 year ago. The problem is controlled. Recent lipid tests were reviewed and are normal. Exacerbating diseases include hypothyroidism and obesity. Pertinent negatives include no chest pain, myalgias or shortness of breath. He is currently on no antihyperlipidemic treatment. Compliance problems include adherence to exercise.  Risk factors for coronary artery disease include dyslipidemia, diabetes mellitus, hypertension, male sex, obesity and a sedentary lifestyle.  Hypertension This is a chronic problem. The current episode started more than 1 year ago. The problem has been resolved since onset. The problem is controlled. Pertinent negatives include no chest pain, headaches, neck pain, shortness of breath or sweats. There are no associated agents to hypertension. Risk factors for coronary artery disease include dyslipidemia, family history, obesity, male gender and sedentary lifestyle. Past treatments include ACE inhibitors. The current treatment provides mild improvement. There are no compliance problems.  Hypertensive end-organ damage includes CVA. He is status post liver transplant..     Review of systems  Constitutional: + steadily  decreasing body weight intentionally,  current Body mass index is 28.3 kg/m. , no fatigue, no subjective hyperthermia, no subjective hypothermia   Objective:    BP (!) 140/88   Pulse 68   Ht 5' 9.5 (1.765 m)   Wt 194 lb 6.4 oz (88.2 kg)   BMI 28.30 kg/m   Wt Readings from Last 3 Encounters:  04/15/24 194 lb 6.4 oz (88.2 kg)  01/09/24 197 lb 6.4 oz (89.5 kg)  06/10/23 208 lb 3.2 oz (94.4 kg)     BP Readings from Last 3 Encounters:  04/15/24 (!) 140/88  01/09/24 (!) 148/85  06/10/23 (!) 140/82     Physical Exam- Limited  Constitutional:  Body mass index is 28.3 kg/m. , not in acute distress, normal state of mind    Diabetic Foot Exam - Simple   No data filed     Results for orders placed or  performed in visit on 04/15/24  HgB A1c   Collection Time: 04/15/24  3:02 PM  Result Value Ref Range   Hemoglobin A1C     HbA1c POC (<> result, manual entry)     HbA1c, POC (prediabetic range)     HbA1c, POC (controlled diabetic range) 7.0 0.0 - 7.0 %   Diabetic Labs (most recent): Lab Results  Component Value Date   HGBA1C 7.0 04/15/2024   HGBA1C 6.2 01/09/2024   HGBA1C 6.4 06/10/2023   Lipid Panel     Component Value Date/Time   CHOL 113 02/06/2022 0806   TRIG 67 02/06/2022 0806   HDL 34 (L) 02/06/2022 0806   CHOLHDL 3.3 02/06/2022 0806   CHOLHDL 4.3 04/13/2017 0547   VLDL 21 04/13/2017 0547   LDLCALC 65 02/06/2022 0806     Assessment & Plan:   1) Controlled type 2 diabetes mellitus with complication, with CKD GFR of 49, CVA  -Recently he had developed CVA requiring rehabilitation and physical therapy. He remains at a high risk for more acute and chronic complications of diabetes which include CAD, CVA, CKD, retinopathy, and neuropathy. These are all discussed in detail with the patient.  He presents today with his logs, showing controlled glycemic profile.  His point-of-care A1c 7%.  He did not document hypoglycemia.  He is taking only 6 units of Lantus   nightly, did not require any prandial insulin  since last visit.  He presents with progressive slow weight loss due to some changes in his dietary habits.   Glucose logs and insulin  administration records pertaining to this visit,  to be scanned into patient's records.  Recent labs reviewed.  - Nutritional counseling repeated at each appointment due to patients tendency to fall back in to old habits.  - The patient admits there is a room for improvement in their diet and drink choices. -  Suggestion is made for the patient to avoid simple carbohydrates from their diet including Cakes, Sweet Desserts / Pastries, Ice Cream, Soda (diet and regular), Sweet Tea, Candies, Chips, Cookies, Sweet Pastries, Store Bought Juices, Alcohol in Excess of 1-2 drinks a day, Artificial Sweeteners, Coffee Creamer, and Sugar-free Products. This will help patient to have stable blood glucose profile and potentially avoid unintended weight gain.   - I encouraged the patient to switch to unprocessed or minimally processed complex starch and increased protein intake (animal or plant source), fruits, and vegetables.   - Patient is advised to stick to a routine mealtimes to eat 3 meals a day and avoid unnecessary snacks (to snack only to correct hypoglycemia).  - I have approached patient with the following individualized plan to manage diabetes and patient agrees.  Avoiding hypoglycemia as a #1 priority in this patient.  He managed to maintain normoglycemia without any prandial insulin  in recent weeks.    -Based on his presentation with tight glycemic control, he will be kept off of prandial insulin  for now.  He is advised to continue Lantus  6 units nightly.   He is advised and willing to monitor blood glucose ACHS. - He would have benefited from continuous glucose monitoring, however he decided not to get it because he cannot afford the monthly co-pay.    He is not a candidate for metformin, SGLT2 inhibitors, nor  incretin in therapy due to history of liver transplant. He will call clinic for hypoglycemia under 70 or hyperglycemia above 150 mg per DL 3 Premeal tests in a row.SABRA  2) BP/HTN: His blood pressure is  controlled to target.  He is advised to continue his current blood pressure medications including Benicar 40 mg p.o. daily.     3) Lipids/HPL:  His most recent lipid panel showed controlled LDL at 65.  He is not on statins  as a result of his history of liver failure status post liver transplant.    4)  Weight/Diet:  His Body mass index is 28.3 kg/m.  This patient has lost more than 50 pounds over the last 2 years-mostly intentionally, carbs/calories restrictions and optimal exercise program discussed with him.  5) Primary hypothyroidism: His TSH was 1.91 in February 2025.  He is advised to continue his Levothyroxine  125 mcg po daily before breakfast.     - We discussed about the correct intake of his thyroid  hormone, on empty stomach at fasting, with water , separated by at least 30 minutes from breakfast and other medications,  and separated by more than 4 hours from calcium, iron, multivitamins, acid reflux medications (PPIs). -Patient is made aware of the fact that thyroid  hormone replacement is needed for life, dose to be adjusted by periodic monitoring of thyroid  function tests.   5) Chronic Care/Health Maintenance: -Patient is on ACEI/ARB  medications and encouraged to continue to follow up with Ophthalmology, Podiatrist at least yearly or according to recommendations, and advised to  stay away from smoking. I have recommended yearly flu vaccine and pneumonia vaccination at least every 5 years; and  sleep for at least 7 hours a day. -His foot exam is normal today.  - I advised patient to maintain close follow up with Marvine Rush, MD for primary care needs.   I spent  26  minutes in the care of the patient today including review of labs from CMP, Lipids, Thyroid  Function, Hematology  (current and previous including abstractions from other facilities); face-to-face time discussing  his blood glucose readings/logs, discussing hypoglycemia and hyperglycemia episodes and symptoms, medications doses, his options of short and long term treatment based on the latest standards of care / guidelines;  discussion about incorporating lifestyle medicine;  and documenting the encounter. Risk reduction counseling performed per USPSTF guidelines to reduce cardiovascular risk factors.     Please refer to Patient Instructions for Blood Glucose Monitoring and Insulin /Medications Dosing Guide  in media tab for additional information. Please  also refer to  Patient Self Inventory in the Media  tab for reviewed elements of pertinent patient history.  [name] participated in the discussions, expressed understanding, and voiced agreement with the above plans.  All questions were answered to his satisfaction. he is encouraged to contact clinic should he have any questions or concerns prior to his return visit.   follow up plan: -Return in about 4 months (around 08/16/2024) for F/U with Pre-visit Labs, Meter/CGM/Logs, A1c here.   Kyle Earl, MD [facility] [address] [place], [place] [address] Phone: [phone] Fax: [phone]  04/15/2024, 6:08 PM

## 2024-04-15 NOTE — Patient Instructions (Signed)

## 2024-05-11 DIAGNOSIS — Z794 Long term (current) use of insulin: Secondary | ICD-10-CM | POA: Diagnosis not present

## 2024-05-11 DIAGNOSIS — Z944 Liver transplant status: Secondary | ICD-10-CM | POA: Diagnosis not present

## 2024-05-11 DIAGNOSIS — N1831 Chronic kidney disease, stage 3a: Secondary | ICD-10-CM | POA: Diagnosis not present

## 2024-05-11 DIAGNOSIS — E1122 Type 2 diabetes mellitus with diabetic chronic kidney disease: Secondary | ICD-10-CM | POA: Diagnosis not present

## 2024-05-11 DIAGNOSIS — Z48298 Encounter for aftercare following other organ transplant: Secondary | ICD-10-CM | POA: Diagnosis not present

## 2024-05-11 DIAGNOSIS — Z7960 Long term (current) use of unspecified immunomodulators and immunosuppressants: Secondary | ICD-10-CM | POA: Diagnosis not present

## 2024-05-12 LAB — COMPREHENSIVE METABOLIC PANEL WITH GFR
ALT: 15 IU/L (ref 0–44)
AST: 19 IU/L (ref 0–40)
Albumin: 3.9 g/dL (ref 3.7–4.7)
Alkaline Phosphatase: 87 IU/L (ref 48–129)
BUN/Creatinine Ratio: 22 (ref 10–24)
BUN: 27 mg/dL (ref 8–27)
Bilirubin Total: 0.5 mg/dL (ref 0.0–1.2)
CO2: 24 mmol/L (ref 20–29)
Calcium: 9.2 mg/dL (ref 8.6–10.2)
Chloride: 107 mmol/L — ABNORMAL HIGH (ref 96–106)
Creatinine, Ser: 1.22 mg/dL (ref 0.76–1.27)
Globulin, Total: 3 g/dL (ref 1.5–4.5)
Glucose: 104 mg/dL — ABNORMAL HIGH (ref 70–99)
Potassium: 4.8 mmol/L (ref 3.5–5.2)
Sodium: 143 mmol/L (ref 134–144)
Total Protein: 6.9 g/dL (ref 6.0–8.5)
eGFR: 59 mL/min/1.73 — ABNORMAL LOW (ref >59)

## 2024-05-12 LAB — LIPID PANEL
Chol/HDL Ratio: 2.8 ratio (ref 0.0–5.0)
Cholesterol, Total: 127 mg/dL (ref 100–199)
HDL: 45 mg/dL (ref >39)
LDL Chol Calc (NIH): 69 mg/dL (ref 0–99)
Triglycerides: 64 mg/dL (ref 0–149)
VLDL Cholesterol Cal: 13 mg/dL (ref 5–40)

## 2024-05-12 LAB — TSH: TSH: 1.96 u[IU]/mL (ref 0.450–4.500)

## 2024-05-12 LAB — T4, FREE: Free T4: 1.27 ng/dL (ref 0.82–1.77)

## 2024-05-13 ENCOUNTER — Telehealth: Payer: Self-pay | Admitting: "Endocrinology

## 2024-05-13 NOTE — Telephone Encounter (Signed)
 Pt said he did his labs for his feb visit on 11/3. Do you want to add more lab orders for that appt

## 2024-06-10 ENCOUNTER — Other Ambulatory Visit: Payer: Self-pay

## 2024-06-10 DIAGNOSIS — Z794 Long term (current) use of insulin: Secondary | ICD-10-CM

## 2024-06-10 MED ORDER — ACCU-CHEK SOFTCLIX LANCETS MISC: 2 refills | Status: AC

## 2024-06-10 MED ORDER — ACCU-CHEK GUIDE TEST VI STRP: ORAL_STRIP | 2 refills | Status: AC

## 2024-06-10 MED ORDER — ACCU-CHEK GUIDE ME W/DEVICE KIT: PACK | 0 refills | Status: AC

## 2024-07-28 ENCOUNTER — Other Ambulatory Visit: Payer: Self-pay | Admitting: Internal Medicine

## 2024-07-28 ENCOUNTER — Other Ambulatory Visit: Payer: Self-pay | Admitting: Nurse Practitioner

## 2024-08-11 NOTE — Progress Notes (Signed)
 [name]                                          MRN: [id number]   08/11/2024   The [facility] Quality Team Specialist reviewed this patient medical record for the purposes of chart review for care gap closure. The following were reviewed: chart review for care gap closure-controlling blood pressure.    [facility] Quality Team

## 2024-08-17 ENCOUNTER — Ambulatory Visit: Admitting: "Endocrinology
